# Patient Record
Sex: Male | Born: 1960 | Race: Black or African American | Hispanic: Yes | Marital: Single | Smoking: Current some day smoker
Health system: Southern US, Community
[De-identification: ages and names within clinical notes are randomized; demographics above are authoritative.]

## PROBLEM LIST (undated history)

## (undated) DIAGNOSIS — J449 Chronic obstructive pulmonary disease, unspecified: Secondary | ICD-10-CM

## (undated) DIAGNOSIS — Z8659 Personal history of other mental and behavioral disorders: Secondary | ICD-10-CM

## (undated) DIAGNOSIS — Z8701 Personal history of pneumonia (recurrent): Secondary | ICD-10-CM

## (undated) DIAGNOSIS — N62 Hypertrophy of breast: Secondary | ICD-10-CM

## (undated) DIAGNOSIS — B20 Human immunodeficiency virus [HIV] disease: Secondary | ICD-10-CM

## (undated) DIAGNOSIS — G4700 Insomnia, unspecified: Secondary | ICD-10-CM

## (undated) DIAGNOSIS — R918 Other nonspecific abnormal finding of lung field: Secondary | ICD-10-CM

## (undated) DIAGNOSIS — Z5111 Encounter for antineoplastic chemotherapy: Secondary | ICD-10-CM

## (undated) DIAGNOSIS — J9819 Other pulmonary collapse: Secondary | ICD-10-CM

## (undated) DIAGNOSIS — C3490 Malignant neoplasm of unspecified part of unspecified bronchus or lung: Secondary | ICD-10-CM

## (undated) DIAGNOSIS — IMO0001 Reserved for inherently not codable concepts without codable children: Secondary | ICD-10-CM

## (undated) DIAGNOSIS — E785 Hyperlipidemia, unspecified: Secondary | ICD-10-CM

## (undated) DIAGNOSIS — IMO0002 Reserved for concepts with insufficient information to code with codable children: Secondary | ICD-10-CM

## (undated) DIAGNOSIS — Z21 Asymptomatic human immunodeficiency virus [HIV] infection status: Secondary | ICD-10-CM

## (undated) HISTORY — DX: Chronic obstructive pulmonary disease, unspecified: J44.9

## (undated) HISTORY — DX: Asymptomatic human immunodeficiency virus (hiv) infection status: Z21

## (undated) HISTORY — DX: Human immunodeficiency virus (HIV) disease: B20

## (undated) HISTORY — PX: FOOT SURGERY: SHX648

## (undated) HISTORY — DX: Hyperlipidemia, unspecified: E78.5

## (undated) HISTORY — PX: BACK SURGERY: SHX140

## (undated) HISTORY — DX: Hypertrophy of breast: N62

## (undated) HISTORY — DX: Encounter for antineoplastic chemotherapy: Z51.11

## (undated) HISTORY — DX: Reserved for concepts with insufficient information to code with codable children: IMO0002

## (undated) HISTORY — PX: COLONOSCOPY: SHX174

## (undated) HISTORY — DX: Other nonspecific abnormal finding of lung field: R91.8

## (undated) HISTORY — DX: Reserved for inherently not codable concepts without codable children: IMO0001

---

## 1997-10-08 ENCOUNTER — Encounter: Admission: RE | Admit: 1997-10-08 | Discharge: 1997-10-08 | Payer: Self-pay | Admitting: Internal Medicine

## 1997-11-27 ENCOUNTER — Encounter: Admission: RE | Admit: 1997-11-27 | Discharge: 1997-11-27 | Payer: Self-pay | Admitting: Internal Medicine

## 1998-01-21 ENCOUNTER — Encounter: Admission: RE | Admit: 1998-01-21 | Discharge: 1998-01-21 | Payer: Self-pay | Admitting: Internal Medicine

## 1998-03-08 ENCOUNTER — Encounter: Admission: RE | Admit: 1998-03-08 | Discharge: 1998-03-08 | Payer: Self-pay | Admitting: Internal Medicine

## 1998-03-25 ENCOUNTER — Encounter: Admission: RE | Admit: 1998-03-25 | Discharge: 1998-03-25 | Payer: Self-pay | Admitting: Internal Medicine

## 1998-06-03 ENCOUNTER — Encounter: Admission: RE | Admit: 1998-06-03 | Discharge: 1998-06-03 | Payer: Self-pay | Admitting: Internal Medicine

## 1998-06-18 ENCOUNTER — Encounter: Admission: RE | Admit: 1998-06-18 | Discharge: 1998-06-18 | Payer: Self-pay | Admitting: Internal Medicine

## 1998-08-27 ENCOUNTER — Ambulatory Visit (HOSPITAL_COMMUNITY): Admission: RE | Admit: 1998-08-27 | Discharge: 1998-08-27 | Payer: Self-pay | Admitting: Internal Medicine

## 1998-09-16 ENCOUNTER — Encounter: Admission: RE | Admit: 1998-09-16 | Discharge: 1998-09-16 | Payer: Self-pay | Admitting: Internal Medicine

## 1999-01-31 ENCOUNTER — Ambulatory Visit (HOSPITAL_COMMUNITY): Admission: RE | Admit: 1999-01-31 | Discharge: 1999-01-31 | Payer: Self-pay | Admitting: Internal Medicine

## 1999-01-31 ENCOUNTER — Encounter: Admission: RE | Admit: 1999-01-31 | Discharge: 1999-01-31 | Payer: Self-pay | Admitting: Internal Medicine

## 1999-02-25 ENCOUNTER — Encounter: Admission: RE | Admit: 1999-02-25 | Discharge: 1999-02-25 | Payer: Self-pay | Admitting: Internal Medicine

## 1999-12-09 ENCOUNTER — Ambulatory Visit (HOSPITAL_COMMUNITY): Admission: RE | Admit: 1999-12-09 | Discharge: 1999-12-09 | Payer: Self-pay | Admitting: Internal Medicine

## 1999-12-09 ENCOUNTER — Encounter: Admission: RE | Admit: 1999-12-09 | Discharge: 1999-12-09 | Payer: Self-pay | Admitting: Internal Medicine

## 1999-12-23 ENCOUNTER — Encounter: Admission: RE | Admit: 1999-12-23 | Discharge: 1999-12-23 | Payer: Self-pay | Admitting: Internal Medicine

## 2007-01-13 ENCOUNTER — Ambulatory Visit: Payer: Self-pay | Admitting: Pulmonary Disease

## 2007-01-28 LAB — CONVERTED CEMR LAB
Creatinine, Ser: 0.8 mg/dL
HIV 1 RNA Quant: <100 copies/mL
Hep B S Ab: REACTIVE

## 2007-03-17 ENCOUNTER — Ambulatory Visit: Payer: Self-pay | Admitting: Pulmonary Disease

## 2008-04-27 LAB — CONVERTED CEMR LAB: CD4 Count: 830 microliters

## 2008-06-22 LAB — CONVERTED CEMR LAB: CD4 Count: 880 microliters

## 2010-01-08 ENCOUNTER — Encounter: Payer: Self-pay | Admitting: Internal Medicine

## 2010-01-09 ENCOUNTER — Encounter: Payer: Self-pay | Admitting: Internal Medicine

## 2010-02-18 ENCOUNTER — Ambulatory Visit: Payer: Self-pay | Admitting: Internal Medicine

## 2010-02-18 DIAGNOSIS — B20 Human immunodeficiency virus [HIV] disease: Secondary | ICD-10-CM

## 2010-02-18 DIAGNOSIS — J449 Chronic obstructive pulmonary disease, unspecified: Secondary | ICD-10-CM

## 2010-02-18 DIAGNOSIS — B009 Herpesviral infection, unspecified: Secondary | ICD-10-CM | POA: Insufficient documentation

## 2010-02-18 DIAGNOSIS — J189 Pneumonia, unspecified organism: Secondary | ICD-10-CM

## 2010-02-18 DIAGNOSIS — F329 Major depressive disorder, single episode, unspecified: Secondary | ICD-10-CM

## 2010-02-18 DIAGNOSIS — L2089 Other atopic dermatitis: Secondary | ICD-10-CM

## 2010-02-18 DIAGNOSIS — M4310 Spondylolisthesis, site unspecified: Secondary | ICD-10-CM | POA: Insufficient documentation

## 2010-02-18 DIAGNOSIS — J4489 Other specified chronic obstructive pulmonary disease: Secondary | ICD-10-CM | POA: Insufficient documentation

## 2010-02-18 LAB — CONVERTED CEMR LAB
ALT: 17 units/L (ref 0–53)
AST: 18 units/L (ref 0–37)
Alkaline Phosphatase: 96 units/L (ref 39–117)
Basophils Absolute: 0 10*3/uL (ref 0.0–0.1)
Basophils Relative: 0 % (ref 0–1)
Chlamydia, Swab/Urine, PCR: NEGATIVE
Creatinine, Ser: 0.93 mg/dL (ref 0.40–1.50)
Eosinophils Relative: 1 % (ref 0–5)
GC Probe Amp, Urine: NEGATIVE
HCT: 44.1 % (ref 39.0–52.0)
Hemoglobin, Urine: NEGATIVE
Hemoglobin: 14.3 g/dL (ref 13.0–17.0)
Ketones, ur: NEGATIVE mg/dL
MCHC: 32.4 g/dL (ref 30.0–36.0)
Monocytes Absolute: 0.5 10*3/uL (ref 0.1–1.0)
Nitrite: NEGATIVE
RDW: 14.4 % (ref 11.5–15.5)
Total Bilirubin: 0.4 mg/dL (ref 0.3–1.2)
Total CHOL/HDL Ratio: 5.7
Urobilinogen, UA: 0.2 (ref 0.0–1.0)
VLDL: 25 mg/dL (ref 0–40)
pH: 6 (ref 5.0–8.0)

## 2010-03-04 ENCOUNTER — Encounter: Payer: Self-pay | Admitting: Internal Medicine

## 2010-03-07 ENCOUNTER — Ambulatory Visit: Payer: Self-pay | Admitting: Internal Medicine

## 2010-03-07 DIAGNOSIS — F1021 Alcohol dependence, in remission: Secondary | ICD-10-CM | POA: Insufficient documentation

## 2010-03-07 DIAGNOSIS — G4700 Insomnia, unspecified: Secondary | ICD-10-CM | POA: Insufficient documentation

## 2010-03-07 DIAGNOSIS — R21 Rash and other nonspecific skin eruption: Secondary | ICD-10-CM

## 2010-07-15 NOTE — Consult Note (Signed)
Summary: [facility]: ID Clinic  [facility]: ID Clinic   Imported By: Florinda Marker 03/13/2010 15:37:55  _____________________________________________________________________  External Attachment:    Type:   Image     Comment:   External Document

## 2010-07-15 NOTE — Assessment & Plan Note (Signed)
Summary: Nurse Visit (Infectious Disease)           Medication Adherence: 02/18/2010   Adherence to medications reviewed with patient. Counseling to provide adequate adherence provided    Prevention For Positives: 02/18/2010   Safe sex practices discussed with patient. Condoms offered.        02/18/2010   Patient was screened for substance abuse and depression. Referal was made as indicated.                     Infectious Disease New Patient Intake Referring MD/Agency: Jaime Morris Address: [place] [address] [place] , [place] [address]  Return Appointment Date: 03/04/2010 Medical Records: Received Health Insurance / Payor: Private Does insurance cover prescriptions? Yes Our patient has been informed that medication assistance programs are available.  Our Co-ordinator will be meeting with the patient during this visit to discuss financial and medication assistance.   Do you have a Primary physician: No Are family members aware of patient's diagnosis?  If so, are they supportive? yes, supportive Describe patient's current social support (family, friends, support groups): good  Medical History Medications: No   Medical Hx Comments: pt is not current  taking medications  due to lack of f/u At [facility] .  Combivir and Sustiva last taken 2/11 Previous meds:  Spiriva, Symbicort, anti hypertensive  [facility] patient since 2008   Family History Hypertension: Yes  Family Side: Maternal, Paternal Diabetes: Yes  Family Side: Maternal, Paternal High Cholesterol: Yes  Family Side: Maternal, Paternal  Tobacco use: current Year started: age 50  Amt: 1 packs per day. Counseled to quit/cut down: yes  Behavioral Health Assessment Have you ever been diagnosed with depression or mental illness? Yes  Diagnosis: Depression: previously on antidepressants  Do you drink alcohol? No Do you use recreational drugs? No Do you feel you have a problem with drugs and/or alcohol? No   Have you ever been in a  treatment facility for any addiction? No  HIV Intake Information When did you first test positive for HIV? 1996 Where was this test performed?  City/State: [place], [place]   [place]: [place] Was this your first time ever being tested or HIV? Yes Risk Factor(s) for HIV: MSM  Method of Exposure to HIV: Homosexual Intercourse-Receptive Other: Last sexual contact 2009   lifetime male partners: 20  Have you ever been hospitalized for any HIV-related condition? Yes [place] Name 1: [place]   Reason: bilateral pneumonia   Date: 2006/2007 Morris Name 2: [place]   Reason: Spinal surgery  2 rods in his spine  Date: 2007  Newly Diagnosed Patients Has a Disease Intervention Specialist from the Health Department contacted the patient? Yes.   The patient has been informed that the [facility] Department will contact ALL newly reported cases. Health Department Contact:  [phone]   (SSN is needed for confirmation)  Reported Today: 02/21/2010 Health Department Contact:  [phone]            (SSN is needed for confirmation)  Person Reporting: prev. reported  Do you have any Non-HIV related medical conditions or other prior hospitalizations or surgeries? Yes If yes, please explain: Spondylolisthesis   HIV Medications Information  Protease Inhibitors (PI's): Kaletra (Lopinavir/Ritonavir): Yes   Last Date of Use:  02/11  Nucleoside Reverse Transcriptase Inhibitors (NRTI's) Combivir (Lamivadin 150mg /Zidovudine 300mg ): Yes   Last Date of Use:  02/11 Epivir (Lamivudine) (3TC): Yes   Last Date of Use:  2008  Infection History  Patient  has been diagnosed with the following opportunistic infections: Herpes Yes Pneumonia, Recurrent Yes Tuberculosis (TB) treated 1980 Are there any other symptoms you need to discuss? No Have you received literature/education prior to this visit about HIV/AIDS? Yes Do you understand the meaning of a Viral Load? Yes Do you understand the meaning of a CD4 count?  Yes Lab Values Education/Handout Given Yes Medication Education/Handout Given Yes  Sexual History Are you in a current relationship? No Are you currently sexually active? No Safe Sex Counseling/Pamphlet Given Sexual History Comments: Last sexual act 2009 per pt report   Evaluation and Follow-Up INTAKE CHECK LIST: HIV Education, Safe Sex Counseling, HIV Material Given, [facility] Consent  Prevention For Positives: 02/18/2010   Safe sex practices discussed with patient. Condoms offered. Jaime [facility] Consent: Yes Are you in need of condoms at this time? No Our patient has been informed that condoms are always available in this clinic.   Brochure Provided for Above Organizations? No Does patient have problems that warrant Social Worker referral? No    -  Date:  06/22/2008    CD4: 880    Viral Load: <50  Date:  04/27/2008    CD4: 830    Viral Load: 58  Date:  01/28/2007    CD4: 1040    Viral Load: <100    Glucose: 100    Hemoglobin: 15.3    Platelets: 309    Creatinine: 0.8    RPR: non reactive    Hep B Surface Antigens: non reactive    Hep B Surface Antibodies: reactive    Hep C Antibodies: non reative    Depression History:      The patient is having a depressed mood most of the day but denies diminished interest in his usual daily activities.        Risk factors for depression include chronic illness.  The patient denies that he feels like life is not worth living, denies that he wishes that he were dead, and denies that he has thought about ending his life.

## 2010-07-15 NOTE — Letter (Signed)
Summary: [facility]: New Pt. Referral  [facility]: New Pt. Referral   Imported By: Florinda Marker 03/18/2010 08:54:16  _____________________________________________________________________  External Attachment:    Type:   Image     Comment:   External Document

## 2010-07-15 NOTE — Letter (Signed)
Summary: New Pt. Referral  New Pt. Referral   Imported By: Florinda Marker 03/13/2010 16:17:14  _____________________________________________________________________  External Attachment:    Type:   Image     Comment:   External Document

## 2010-07-15 NOTE — Assessment & Plan Note (Signed)
Summary: new pt/jc   CC:  new pt. to establish, lab results, and c/o trouble sleeping.  History of Present Illness: This is the first ID clinic visit for Jaime Morris. He was diagnosed HIV (+) in 1996.  He was receiving his care a [facility] but decided to transfer here.  He was on combivir and Sustiva until 2/11 but stopped due to financial reasons.  His deductible for his insurance is 1600 dollars and he did not have it. Genotype done at [facility] in August did not show any resistance. He has been feeling well.  He just noted some posterior cervical adenopathy. Risk factor is MSM.  Not currently with a partner. He also noted some insomnia.  He has been using benadryl without much success. He had been on ambien in the past. Ptalso c/o pruritic rash on arms.  No new soaps, detergents etc.  Preventive Screening-Counseling & Management  Alcohol-Tobacco     Smoking Status: current     Smoking Cessation Counseling: yes     Packs/Day: 1     Year Started: age 5   Caffeine-Diet-Exercise     Caffeine use/day: coffee 3 per day     Does Patient Exercise: no  Safety-Violence-Falls     Seat Belt Use: yes      Sexual History:  none.        Drug Use:  former and alcohol.    Comments: pt. declined condoms   Updated Prior Medication List: NORVASC 10 MG TABS (AMLODIPINE BESYLATE) Take 1 tablet by mouth once a day ENALAPRIL MALEATE 10 MG TABS (ENALAPRIL MALEATE) Take 1 tablet by mouth once a day AMBIEN 10 MG TABS (ZOLPIDEM TARTRATE) Take 1 tablet by mouth at bedtime as needed LOTRISONE 1-0.05 % CREA (CLOTRIMAZOLE-BETAMETHASONE) apply two times a day ATRIPLA 600-200-300 MG TABS (EFAVIRENZ-EMTRICITAB-TENOFOVIR) Take 1 tablet by mouth at bedtime  Current Allergies (reviewed today): No known allergies  Social History: Drug Use:  former, alcohol Sexual History:  none  Review of Systems  The patient denies anorexia, fever, weight loss, and prolonged cough.    Vital Signs:  Patient profile:   50 year old  male Height:      71.5 inches (181.61 cm) Weight:      186.8 pounds (84.91 kg) BMI:     25.78 Temp:     98.0 degrees F (36.67 degrees C) oral Pulse rate:   87 / minute BP sitting:   141 / 78  (right arm)  Vitals Entered By: Wendall Mola CMA Duncan Dull) (March 07, 2010 2:02 PM) CC: new pt. to establish, lab results, c/o trouble sleeping Is Patient Diabetic? No Pain Assessment Patient in pain? no      Nutritional Status BMI of 25 - 29 = overweight Nutritional Status Detail appetite "good"  Does patient need assistance? Functional Status Self care Ambulation Normal Comments pt. has been off HAART meds since 2/11   Physical Exam  General:  alert, well-developed, well-nourished, and well-hydrated.   Head:  normocephalic and atraumatic.   Mouth:  pharynx pink and moist.  no thrush  Neck:  bilateral posterior nodes Lungs:  normal breath sounds.      Impression & Recommendations:  Problem # 1:  HIV INFECTION (ICD-042) Will refer to Byrd Hesselbach to see if we can get him help getting his meds. all she can do is to get him a co-pay card. He said he willwait to [place] he cna pay the deductable and start meds.  CD4ct is still good so i do  not think waiting will hurt him.  Influenza vaccine given. Diagnostics Reviewed:  HIV: HIV positive - not AIDS (02/18/2010)   CD4: 520 (02/19/2010)   WBC: 6.2 (02/18/2010)   Hgb: 14.3 (02/18/2010)   HCT: 44.1 (02/18/2010)   Platelets: 218 (02/18/2010) HIV-1 RNA: 27400 (02/18/2010)   HBSAg: non reactive (01/28/2007)  Orders: New Patient Level III (99203)Future Orders: T-CD4SP ([facility]) (CD4SP) ... 06/05/2010 T-HIV Viral Load [phone]) ... 06/05/2010 T-Comprehensive Metabolic Panel [phone]) ... 06/05/2010 T-CBC w/Diff ([id number]) ... 06/05/2010  Problem # 2:  INSOMNIA (ICD-780.52)  His updated medication list for this problem includes:    Ambien 10 Mg Tabs (Zolpidem tartrate) .Marland Kitchen... Take 1 tablet by mouth at bedtime as  needed  Problem # 3:  SKIN RASH (ICD-782.1) will treat with lotrisone His updated medication list for this problem includes:    Lotrisone 1-0.05 % Crea (Clotrimazole-betamethasone) .Marland Kitchen... Apply two times a day  Medications Added to Medication List This Visit: 1)  Norvasc 10 Mg Tabs (Amlodipine besylate) .... Take 1 tablet by mouth once a day 2)  Enalapril Maleate 10 Mg Tabs (Enalapril maleate) .... Take 1 tablet by mouth once a day 3)  Ambien 10 Mg Tabs (Zolpidem tartrate) .... Take 1 tablet by mouth at bedtime as needed 4)  Lotrisone 1-0.05 % Crea (Clotrimazole-betamethasone) .... Apply two times a day 5)  Atripla 600-200-300 Mg Tabs (Efavirenz-emtricitab-tenofovir) .... Take 1 tablet by mouth at bedtime  Other Orders: Influenza Vaccine NON MCR ([id number])  Patient Instructions: 1)  Please schedule a follow-up appointment in 3 months, 2 weeks after labs.  Prescriptions: ATRIPLA 600-200-300 MG TABS (EFAVIRENZ-EMTRICITAB-TENOFOVIR) Take 1 tablet by mouth at bedtime  #90 x 3   Entered and Authorized by:   Yisroel Ramming MD   Signed by:   Yisroel Ramming MD on 03/07/2010   Method used:   Print then Give to Patient   RxID:   [id number] LOTRISONE 1-0.05 % CREA (CLOTRIMAZOLE-BETAMETHASONE) apply two times a day  #60gm x 0   Entered and Authorized by:   Yisroel Ramming MD   Signed by:   Yisroel Ramming MD on 03/07/2010   Method used:   Print then Give to Patient   RxID:   [id number] AMBIEN 10 MG TABS (ZOLPIDEM TARTRATE) Take 1 tablet by mouth at bedtime as needed  #90 x 3   Entered and Authorized by:   Yisroel Ramming MD   Signed by:   Yisroel Ramming MD on 03/07/2010   Method used:   Print then Give to Patient   RxID:   [id number]    Immunizations Administered:  Influenza Vaccine # 1:    Vaccine Type: Fluvax Non-MCR    Site: right deltoid    Mfr: Novartis    Dose: 0.5 ml    Route: IM    Given by: Wendall Mola CMA ( AAMA)    Exp. Date: 09/14/2010    Lot #: [id number]  3P    VIS given: 01/07/10 version given March 07, 2010.  Flu Vaccine Consent Questions:    Do you have a history of severe allergic reactions to this vaccine? no    Any prior history of allergic reactions to egg and/or gelatin? no    Do you have a sensitivity to the preservative Thimersol? no    Do you have a past history of Guillan-Barre Syndrome? no    Do you currently have an acute febrile illness? no    Have you ever had a severe reaction to latex? no  Vaccine information given and explained to patient? yes

## 2010-07-15 NOTE — Miscellaneous (Signed)
Summary: HIPAA Restrictions  HIPAA Restrictions   Imported By: Florinda Marker 02/19/2010 09:57:00  _____________________________________________________________________  External Attachment:    Type:   Image     Comment:   External Document

## 2010-07-16 ENCOUNTER — Encounter (INDEPENDENT_AMBULATORY_CARE_PROVIDER_SITE_OTHER): Payer: Self-pay | Admitting: *Deleted

## 2010-07-21 ENCOUNTER — Encounter: Payer: Self-pay | Admitting: Adult Health

## 2010-07-21 ENCOUNTER — Other Ambulatory Visit: Payer: Self-pay | Admitting: Adult Health

## 2010-07-21 ENCOUNTER — Other Ambulatory Visit (INDEPENDENT_AMBULATORY_CARE_PROVIDER_SITE_OTHER): Payer: BC Managed Care – PPO

## 2010-07-21 DIAGNOSIS — B20 Human immunodeficiency virus [HIV] disease: Secondary | ICD-10-CM

## 2010-07-21 LAB — CONVERTED CEMR LAB
AST: 17 units/L (ref 0–37)
Albumin: 4 g/dL (ref 3.5–5.2)
BUN: 14 mg/dL (ref 6–23)
Basophils Absolute: 0 10*3/uL (ref 0.0–0.1)
Calcium: 9.5 mg/dL (ref 8.4–10.5)
Chloride: 108 meq/L (ref 96–112)
HIV 1 RNA Quant: 42200 copies/mL — ABNORMAL HIGH (ref <20)
HIV-1 RNA Quant, Log: 4.63 — ABNORMAL HIGH (ref <1.30)
Hemoglobin: 14.1 g/dL (ref 13.0–17.0)
Lymphocytes Relative: 52 % — ABNORMAL HIGH (ref 12–46)
Monocytes Absolute: 0.5 10*3/uL (ref 0.1–1.0)
Neutro Abs: 1.8 10*3/uL (ref 1.7–7.7)
Neutrophils Relative %: 37 % — ABNORMAL LOW (ref 43–77)
Platelets: 174 10*3/uL (ref 150–400)
Potassium: 4.6 meq/L (ref 3.5–5.3)
RDW: 14.4 % (ref 11.5–15.5)
Total Protein: 7.6 g/dL (ref 6.0–8.3)

## 2010-07-22 LAB — T-HELPER CELL (CD4) - (RCID CLINIC ONLY): CD4 T Cell Abs: 510 uL (ref 400–2700)

## 2010-07-23 ENCOUNTER — Encounter (INDEPENDENT_AMBULATORY_CARE_PROVIDER_SITE_OTHER): Payer: Self-pay | Admitting: *Deleted

## 2010-07-23 NOTE — Miscellaneous (Signed)
  Clinical Lists Changes  Observations: Added new observation of INCOMESOURCE: UNKNOWN (07/16/2010 11:53) Added new observation of HOUSEINCOME: 0  (07/16/2010 11:53) Added new observation of #CHILD<18 IN: Unknown  (07/16/2010 11:53) Added new observation of FAMILYSIZE: 0  (07/16/2010 11:53) Added new observation of HOUSING: Unknown  (07/16/2010 11:53) Added new observation of YEARLYEXPEN: 0  (07/16/2010 11:53)

## 2010-07-31 NOTE — Miscellaneous (Signed)
  Clinical Lists Changes 

## 2010-08-04 ENCOUNTER — Encounter: Payer: Self-pay | Admitting: Adult Health

## 2010-08-04 ENCOUNTER — Ambulatory Visit: Payer: BC Managed Care – PPO | Admitting: Adult Health

## 2010-08-04 DIAGNOSIS — F17200 Nicotine dependence, unspecified, uncomplicated: Secondary | ICD-10-CM | POA: Insufficient documentation

## 2010-08-12 NOTE — Assessment & Plan Note (Signed)
Summary: 2WK F/U/VS   Vital Signs:  Patient profile:   50 year old male Height:      71.5 inches Weight:      191 pounds BMI:     26.36 BSA:     2.08 Temp:     97.9 degrees F oral Pulse rate:   87 / minute BP sitting:   147 / 83  (right arm)  Vitals Entered By: Tomasita Morrow RN (August 04, 2010 9:06 AM) CC: f/u ov   ,  talk about starting medication   Pain Assessment Patient in pain? no      Nutritional Status BMI of 25 - 29 = overweight Nutritional Status Detail normal  Have you ever been in a relationship where you felt threatened, hurt or afraid?No  Domestic Violence Intervention none  Does patient need assistance? Functional Status Self care Ambulation Normal   CC:  f/u ov    and talk about starting medication  .  History of Present Illness: Follow-up visit.  Ready to re-start ARV therapy.  Slated to start Atripla therapy and has adequate funding for meds, but still claims meds remain costly prohibitive.  Also states have "rash"  on right hip area which seems to be getting large.  Rash pruritic but without vessicles, drainage or pus formation.  States is currently planning to start Chantix therapy for smoking cessation.  Received Rx from work, and stated will begin the next "few days."  Preventive Screening-Counseling & Management  Alcohol-Tobacco     Smoking Status: current     Smoking Cessation Counseling: yes     Packs/Day: 1     Year Started: age 22   Caffeine-Diet-Exercise     Does Patient Exercise: no  Comments: starting Chantix today   Hep-HIV-STD-Contraception     HIV Risk: risk noted  Safety-Violence-Falls     Seat Belt Use: yes  Allergies (verified): No Known Drug Allergies  Past History:  Past medical, surgical, family and social histories (including risk factors) reviewed for relevance to current acute and chronic problems.  Family History: Reviewed history and no changes required.  Social History: Reviewed history and no changes  required.  Review of Systems  The patient denies anorexia, fever, weight loss, weight gain, vision loss, decreased hearing, hoarseness, chest pain, syncope, dyspnea on exertion, peripheral edema, prolonged cough, headaches, hemoptysis, abdominal pain, melena, hematochezia, severe indigestion/heartburn, hematuria, incontinence, genital sores, muscle weakness, suspicious skin lesions, transient blindness, difficulty walking, depression, unusual weight change, abnormal bleeding, enlarged lymph nodes, angioedema, and testicular masses.         Rash outluined in HPI  Physical Exam  General:  alert, well-developed, well-nourished, and well-hydrated.   Head:  normocephalic and atraumatic.   Eyes:  vision grossly intact, pupils equal, pupils round, and pupils reactive to light.   Ears:  R ear normal and L ear normal.   Nose:  no external deformity, no external erythema, and no nasal discharge.   Mouth:  good dentition, no gingival abnormalities, no dental plaque, and pharynx pink and moist.   Neck:  supple and full ROM.   Lungs:  normal respiratory effort and normal breath sounds.   Heart:  normal rate, regular rhythm, no murmur, no gallop, and no rub.   Abdomen:  soft, non-tender, normal bowel sounds, no hepatomegaly, and no splenomegaly.   Genitalia:  Testes bilaterally descended without nodularity, tenderness or masses. No scrotal masses or lesions. No penis lesions or urethral discharge. Msk:  normal ROM, no joint tenderness,  no joint swelling, no joint warmth, and no redness over joints.   Extremities:  No clubbing, cyanosis, edema, or deformity noted with normal full range of motion of all joints.   Neurologic:  alert & oriented X3, cranial nerves II-XII intact, strength normal in all extremities, and gait normal.   Skin:  Papular, hyperkeratotic, hyperpigmened skin patch inferior to right greater trochanter measuring  10 x 7 cm. No drainage, no vesicles, some scabbed excoriations c/w healed  scratch wounds noted. Cervical Nodes:  shoddy A&P Cx LN's Psych:  Cognition and judgment appear intact. Alert and cooperative with normal attention span and concentration. No apparent delusions, illusions, hallucinations   Impression & Recommendations:  Problem # 1:  HIV INFECTION (ICD-042) CD4 510 @ 19% with HIV RNA 422000 copies/ml per PCR.  Spent > 20 min discussing treatment regimen,, medication SE's, ADR's, and toxicities.  Time allowed to answer questions and he verbally acknowledged understanding of instructions.  Plan to begin Atripla 1 tab by mouth at bedtime on empty stomach.  To return for labs in 4 weeks with f/u in 6 weeks.  Atripla co-pay cards also dispensed.  Verbally acknowledged and agreed with plan.  Problem # 2:  SKIN RASH (ICD-782.1) Findings c/w dermatiophyte infection.  Instructed to use Lotrisone cream to this site two times a day and we will monitor response on RTC. His updated medication list for this problem includes:    Lotrisone 1-0.05 % Crea (Clotrimazole-betamethasone) .Marland Kitchen... Apply two times a day  Problem # 3:  SMOKER (ICD-305.1) Preparing to start Chantix therapy.  Informed of SE's of therapy and the possibility of increased vivid dreams while re-starting efavirenz tjherapy.  Verbally acknowledged this and agreed to contact clinic if depressiion or emotional changes occur while on therapy.   Prescriptions: AMBIEN 10 MG TABS (ZOLPIDEM TARTRATE) Take 1 tablet by mouth at bedtime as needed  #90 x 3   Entered and Authorized by:   Talmadge Chad NP   Signed by:   Talmadge Chad NP on 08/04/2010   Method used:   Print then Give to Patient   RxID:   [id number] ATRIPLA 600-200-300 MG TABS (EFAVIRENZ-EMTRICITAB-TENOFOVIR) Take 1 tablet by mouth at bedtime  #90 x 3   Entered and Authorized by:   Talmadge Chad NP   Signed by:   Talmadge Chad NP on 08/04/2010   Method used:   Print then Give to Patient   RxID:   [id number]

## 2010-08-14 ENCOUNTER — Encounter: Payer: Self-pay | Admitting: Adult Health

## 2010-08-14 ENCOUNTER — Telehealth (INDEPENDENT_AMBULATORY_CARE_PROVIDER_SITE_OTHER): Payer: Self-pay | Admitting: *Deleted

## 2010-08-21 NOTE — Progress Notes (Addendum)
Summary: prior auth. for Ambien faxed  Phone Note Outgoing Call   Call placed by: Annice Pih Summary of Call: Faxed prior auth. for Ambien to [employer] at fax # [phone],  pts. [employer] ID # [id number] Initial call taken by: Wendall Mola CMA ( AAMA),  August 14, 2010 10:52 AM     Appended Document: prior auth. for Ambien faxed Case # [id number].  Pt. approved from 07/24/10 to 08/14/2011 by [facility].

## 2010-08-26 NOTE — Medication Information (Signed)
Summary: Tax adviser   Imported By: Florinda Marker 08/22/2010 09:06:05  _____________________________________________________________________  External Attachment:    Type:   Image     Comment:   External Document

## 2010-08-26 NOTE — Letter (Signed)
Summary: [employer]  [employer]   Imported By: Florinda Marker 08/22/2010 09:05:16  _____________________________________________________________________  External Attachment:    Type:   Image     Comment:   External Document

## 2010-08-26 NOTE — Medication Information (Signed)
Summary: [employer] Prior Auth: RX  [employer] Prior Auth: RX   Imported By: Florinda Marker 08/22/2010 09:03:41  _____________________________________________________________________  External Attachment:    Type:   Image     Comment:   External Document

## 2010-08-28 ENCOUNTER — Encounter: Payer: Self-pay | Admitting: Adult Health

## 2010-08-28 LAB — T-HELPER CELL (CD4) - (RCID CLINIC ONLY)
CD4 % Helper T Cell: 18 % — ABNORMAL LOW (ref 33–55)
CD4 T Cell Abs: 520 uL (ref 400–2700)

## 2010-09-03 ENCOUNTER — Other Ambulatory Visit: Payer: Self-pay | Admitting: Adult Health

## 2010-09-03 ENCOUNTER — Other Ambulatory Visit: Payer: BC Managed Care – PPO | Admitting: Adult Health

## 2010-09-03 DIAGNOSIS — B20 Human immunodeficiency virus [HIV] disease: Secondary | ICD-10-CM

## 2010-09-04 LAB — CBC WITH DIFFERENTIAL/PLATELET
Basophils Relative: 0 % (ref 0–1)
Hemoglobin: 14.5 g/dL (ref 13.0–17.0)
Lymphs Abs: 3 10*3/uL (ref 0.7–4.0)
Monocytes Relative: 8 % (ref 3–12)
Neutro Abs: 2.5 10*3/uL (ref 1.7–7.7)
Neutrophils Relative %: 41 % — ABNORMAL LOW (ref 43–77)
Platelets: 215 10*3/uL (ref 150–400)
RBC: 5.18 MIL/uL (ref 4.22–5.81)

## 2010-09-04 LAB — T-HELPER CELLS (CD4) COUNT (NOT AT ARMC)
Total Lymphocyte: 49 % — ABNORMAL HIGH (ref 12–46)
WBC, lymph enumeration: 6.2 10*3/uL (ref 4.0–10.5)

## 2010-09-04 LAB — COMPREHENSIVE METABOLIC PANEL
Albumin: 4.4 g/dL (ref 3.5–5.2)
BUN: 15 mg/dL (ref 6–23)
CO2: 25 mEq/L (ref 19–32)
Calcium: 9.5 mg/dL (ref 8.4–10.5)
Chloride: 102 mEq/L (ref 96–112)
Glucose, Bld: 107 mg/dL — ABNORMAL HIGH (ref 70–99)
Potassium: 4.6 mEq/L (ref 3.5–5.3)
Total Protein: 7.9 g/dL (ref 6.0–8.3)

## 2010-09-18 ENCOUNTER — Ambulatory Visit (INDEPENDENT_AMBULATORY_CARE_PROVIDER_SITE_OTHER): Payer: BC Managed Care – PPO | Admitting: Adult Health

## 2010-09-18 ENCOUNTER — Encounter: Payer: Self-pay | Admitting: Adult Health

## 2010-09-18 DIAGNOSIS — J449 Chronic obstructive pulmonary disease, unspecified: Secondary | ICD-10-CM

## 2010-09-18 DIAGNOSIS — Z79899 Other long term (current) drug therapy: Secondary | ICD-10-CM

## 2010-09-18 DIAGNOSIS — Z21 Asymptomatic human immunodeficiency virus [HIV] infection status: Secondary | ICD-10-CM

## 2010-09-18 DIAGNOSIS — F17200 Nicotine dependence, unspecified, uncomplicated: Secondary | ICD-10-CM

## 2010-09-18 DIAGNOSIS — B20 Human immunodeficiency virus [HIV] disease: Secondary | ICD-10-CM

## 2010-09-18 NOTE — Progress Notes (Signed)
  Subjective:    Patient ID: Jaime Morris, male    DOB: Sep 26, 1960, 50 y.o.   MRN: [id number]  HPI Doing well, adherent with meds, some mild headache, manageable.  Stopped smoking on 3/29.  States has gained 8 pounds since.   Review of Systems  Constitutional: Positive for appetite change.  HENT: Negative.   Eyes: Negative.   Respiratory: Negative.   Cardiovascular: Negative.   Gastrointestinal: Negative.   Genitourinary: Negative.   Musculoskeletal: Negative.   Skin: Negative.   Neurological: Negative.   Hematological: Negative.   Psychiatric/Behavioral: Negative.        Objective:   Physical Exam  Constitutional: He is oriented to person, place, and time. He appears well-developed.  HENT:  Head: Normocephalic and atraumatic.  Right Ear: External ear normal.  Left Ear: External ear normal.  Nose: Nose normal.  Mouth/Throat: Oropharynx is clear and moist.  Eyes: Conjunctivae are normal. Pupils are equal, round, and reactive to light.  Neck: Normal range of motion. Neck supple.  Cardiovascular: Normal rate, regular rhythm, normal heart sounds and intact distal pulses.   Pulmonary/Chest: Effort normal and breath sounds normal.  Abdominal: Soft. Bowel sounds are normal.  Musculoskeletal: Normal range of motion.  Neurological: He is alert and oriented to person, place, and time. Coordination normal.  Skin: Skin is warm and dry.  Psychiatric: He has a normal mood and affect. His behavior is normal. Judgment and thought content normal.          Assessment & Plan:  HIV:  CD4 547 @ 18% with VL of 138 copies.  Clinically stable on current regimen.  Will CPM with fasting labs in 10 weeks and f/u in 3 months.  Smoker: Positive reinforcement provided, activities to offset appetite suggested.

## 2010-10-28 NOTE — Assessment & Plan Note (Signed)
[name]                             PULMONARY OFFICE NOTE   Jaime Morris, Jaime Morris                      MRN:          [id number]  DATE:01/13/2007                            DOB:          [birth year]    HISTORY OF PRESENT ILLNESS:  The patient is a 50 year old gentleman who  I have been asked to see for obstructive lung disease.  The patient  states that he has had dyspnea on exertion for years, but has progressed  somewhat lately.  He is trying to get on an exercise program.  The  patient has been on Spiriva in the past, but has not been taking it on a  regular basis.  He states that he has approximately 10 blocks dyspnea on  exertion at a moderate pace on flat ground.  He has noted it bringing  groceries in from the car, and feels that he can vacuum the whole house  without getting overly winded.  He does have a dry hacky cough primarily  productive of white mucus in the mornings.  Unfortunately the patient  continues to smoke.   PAST MEDICAL HISTORY:  1. Significant for hypertension.  2. History of allergic rhinitis.  3. History of COPD.  4. History of HIV positivity.  5. History of neck and back surgery in 2007.   CURRENT MEDICATIONS:  1. Norvasc 10 mg daily.  2. Enalapril 10 mg daily.  3. Combivir 150/30 b.i.d.  4. Sustiva 600 mg daily.  5. Ambien p.r.n.   ALLERGIES:  The patient has no known drug allergies.   SOCIAL HISTORY:  He is single.  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  He has a history of smoking one pack per day for 35  years and continues to do so.   FAMILY HISTORY:  Remarkable for parents having hypertension and CVA's;  otherwise, unremarkable.   REVIEW OF SYSTEMS:  As per History of Present Illness.  See patient  intake form documented on the chart.   PHYSICAL EXAMINATION:  GENERAL:  He is a thin male in no acute distress.  VITAL SIGNS:  Blood pressure 118/74, pulse 76, temperature 98.2, weight  191  pounds, O2 saturation on room air 96%.  HEENT:  Pupils equal, round, reactive to light and accommodation.  Extraocular muscles are intact.  Nares are patent without discharge.  Oropharynx is clear.  NECK:  Supple without JVD or lymphadenopathy.  There is no palpable  thyromegaly.  CHEST:  Reveals decreased breath sounds throughout but no wheezes.  CARDIAC:  Reveals regular rate and rhythm, no murmurs, rubs or gallops.  ABDOMEN:  Soft, nontender with good bowel sounds.  GENITAL/RECTAL/BREASTS:  Not done and not indicated.  EXTREMITIES:  Lower extremities are without edema, pulses are intact  distally.  NEUROLOGICAL:  Alert and oriented with no obvious motor deficits.   LABORATORY DATA:  Pulmonary function study obtained in the office does  show moderate obstructive disease with an FEV-1 58%.  He has no  restriction and only a mild reduction in DLCO.  He was not responsive to  bronchodilators.  Chest x-ray was done and showed no acute process.   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  Unfortunately he has ongoing tobacco use and probably has recurrent  asthmatic bronchitis from his smoking.  If he would quit smoking, I  suspect he would do well with Spiriva alone.  However, may benefit from  inhaled corticosteroids until then.   PLAN:  1. Advair 250/50 b.i.d. to be added to the Spiriva.  2. I have given the patient a prescription for Chantix in order to      help him quit smoking.  However, I told him not to fill this until      he called his infectious disease doctor and made sure it does not      interfere with any of his HIV medications.  3. The patient will follow up in eight weeks or sooner if there are      problems.     Barbaraann Share, MD,FCCP  Electronically Signed    [facility]  DD: 03/02/2007  DT: 03/03/2007  Job #: [id number]   cc:   Bess Kinds, MD

## 2010-12-16 ENCOUNTER — Other Ambulatory Visit: Payer: Self-pay | Admitting: Infectious Diseases

## 2010-12-16 ENCOUNTER — Other Ambulatory Visit (INDEPENDENT_AMBULATORY_CARE_PROVIDER_SITE_OTHER): Payer: BC Managed Care – PPO

## 2010-12-16 DIAGNOSIS — B20 Human immunodeficiency virus [HIV] disease: Secondary | ICD-10-CM

## 2010-12-16 DIAGNOSIS — Z79899 Other long term (current) drug therapy: Secondary | ICD-10-CM

## 2010-12-16 LAB — CBC WITH DIFFERENTIAL/PLATELET
Hemoglobin: 14.9 g/dL (ref 13.0–17.0)
Lymphocytes Relative: 36 % (ref 12–46)
Lymphs Abs: 2.2 10*3/uL (ref 0.7–4.0)
Neutro Abs: 3.4 10*3/uL (ref 1.7–7.7)
Neutrophils Relative %: 56 % (ref 43–77)
Platelets: 241 10*3/uL (ref 150–400)
RBC: 5.07 MIL/uL (ref 4.22–5.81)
WBC: 6.1 10*3/uL (ref 4.0–10.5)

## 2010-12-16 LAB — COMPREHENSIVE METABOLIC PANEL
Albumin: 4.4 g/dL (ref 3.5–5.2)
BUN: 13 mg/dL (ref 6–23)
CO2: 24 mEq/L (ref 19–32)
Glucose, Bld: 107 mg/dL — ABNORMAL HIGH (ref 70–99)
Potassium: 4.4 mEq/L (ref 3.5–5.3)
Sodium: 137 mEq/L (ref 135–145)
Total Protein: 7.9 g/dL (ref 6.0–8.3)

## 2010-12-16 LAB — LIPID PANEL
Cholesterol: 196 mg/dL (ref 0–200)
HDL: 39 mg/dL — ABNORMAL LOW (ref >39)
Triglycerides: 118 mg/dL (ref <150)
VLDL: 24 mg/dL (ref 0–40)

## 2010-12-18 LAB — T-HELPER CELL (CD4) - (RCID CLINIC ONLY)
CD4 % Helper T Cell: 22 % — ABNORMAL LOW (ref 33–55)
CD4 T Cell Abs: 540 uL (ref 400–2700)

## 2010-12-30 ENCOUNTER — Ambulatory Visit: Payer: BC Managed Care – PPO | Admitting: Adult Health

## 2010-12-30 ENCOUNTER — Encounter: Payer: Self-pay | Admitting: Adult Health

## 2010-12-30 ENCOUNTER — Ambulatory Visit (INDEPENDENT_AMBULATORY_CARE_PROVIDER_SITE_OTHER): Payer: BC Managed Care – PPO | Admitting: Adult Health

## 2010-12-30 VITALS — BP 143/83 | HR 86 | Temp 98.1°F | Ht 70.0 in | Wt 194.0 lb

## 2010-12-30 DIAGNOSIS — Z0000 Encounter for general adult medical examination without abnormal findings: Secondary | ICD-10-CM

## 2010-12-30 DIAGNOSIS — B20 Human immunodeficiency virus [HIV] disease: Secondary | ICD-10-CM

## 2010-12-30 DIAGNOSIS — E785 Hyperlipidemia, unspecified: Secondary | ICD-10-CM

## 2010-12-30 DIAGNOSIS — Z21 Asymptomatic human immunodeficiency virus [HIV] infection status: Secondary | ICD-10-CM

## 2010-12-30 MED ORDER — PNEUMOCOCCAL VAC POLYVALENT 25 MCG/0.5ML IJ INJ: 0.5000 mL | INJECTION | Freq: Once | INTRAMUSCULAR | Status: DC

## 2010-12-30 NOTE — Progress Notes (Signed)
  Subjective:    Patient ID: Jaime Morris, male    DOB: [birth year], 50 y.o.   MRN: [id number]  HPI presents to clinic today. Her scheduled followup. Endorses adherence to his antiretrovirals with good tolerance and no complications. Voices no physical complaints today.    Review of Systems  Constitutional: Negative.   HENT: Negative.   Eyes: Negative.   Respiratory: Negative.   Cardiovascular: Negative.   Gastrointestinal: Negative.   Genitourinary: Negative.   Musculoskeletal: Negative.   Skin: Negative.   Neurological: Negative.   Hematological: Negative.   Psychiatric/Behavioral: Negative.        Objective:   Physical Exam  Constitutional: He is oriented to person, place, and time. He appears well-developed and well-nourished.  HENT:  Head: Normocephalic and atraumatic.  Eyes: Conjunctivae and EOM are normal. Pupils are equal, round, and reactive to light.  Neck: Normal range of motion. Neck supple.  Cardiovascular: Normal rate, regular rhythm, normal heart sounds and intact distal pulses.   Pulmonary/Chest: Effort normal and breath sounds normal.  Abdominal: Soft. Bowel sounds are normal.  Musculoskeletal: Normal range of motion.  Neurological: He is alert and oriented to person, place, and time. No cranial nerve deficit. He exhibits normal muscle tone. Coordination normal.  Skin: Skin is warm and dry.  Psychiatric: He has a normal mood and affect. His behavior is normal. Judgment and thought content normal.          Assessment & Plan:  1. HIV. Labs drawn 12/16/2010. Show a CD4 count of 540 at 22% with a viral load less than 20 copies./ML. Clinically stable on current regimen. Recommend continuing present management, following up in 4 months with repeat fasting labs 2 weeks before next appointment.  2. Dyslipidemia. HDL showing marginal decline at 39 mg/dL, and LDL. She calculated at 133 mg/dL. At this point, medical intervention may not be necessary. Recommend a  carb restricted low-fat high-protein diet with increased exercise. Periods throughout the week. We'll repeat lipids on next blood draw.  Verbally acknowledged all information was provided to him and agreed with plan of care.

## 2011-02-26 ENCOUNTER — Other Ambulatory Visit: Payer: Self-pay | Admitting: Licensed Clinical Social Worker

## 2011-02-26 DIAGNOSIS — G4700 Insomnia, unspecified: Secondary | ICD-10-CM

## 2011-02-26 MED ORDER — ZOLPIDEM TARTRATE 10 MG PO TABS: 10.0000 mg | ORAL_TABLET | Freq: Every evening | ORAL | Status: DC | PRN

## 2011-06-14 ENCOUNTER — Other Ambulatory Visit: Payer: Self-pay | Admitting: Adult Health

## 2011-09-29 ENCOUNTER — Other Ambulatory Visit: Payer: 59

## 2011-09-29 ENCOUNTER — Other Ambulatory Visit: Payer: Self-pay | Admitting: Licensed Clinical Social Worker

## 2011-09-29 DIAGNOSIS — B20 Human immunodeficiency virus [HIV] disease: Secondary | ICD-10-CM

## 2011-09-29 DIAGNOSIS — E785 Hyperlipidemia, unspecified: Secondary | ICD-10-CM

## 2011-09-29 LAB — LIPID PANEL
HDL: 32 mg/dL — ABNORMAL LOW (ref >39)
LDL Cholesterol: 158 mg/dL — ABNORMAL HIGH (ref 0–99)
Total CHOL/HDL Ratio: 6.8 Ratio
VLDL: 26 mg/dL (ref 0–40)

## 2011-09-29 LAB — COMPLETE METABOLIC PANEL WITH GFR
ALT: 16 U/L (ref 0–53)
AST: 16 U/L (ref 0–37)
Alkaline Phosphatase: 94 U/L (ref 39–117)
Calcium: 9.2 mg/dL (ref 8.4–10.5)
Chloride: 106 mEq/L (ref 96–112)
Creat: 0.88 mg/dL (ref 0.50–1.35)
Potassium: 4.6 mEq/L (ref 3.5–5.3)

## 2011-09-29 LAB — CBC WITH DIFFERENTIAL/PLATELET
Basophils Absolute: 0 10*3/uL (ref 0.0–0.1)
Basophils Relative: 0 % (ref 0–1)
Lymphocytes Relative: 37 % (ref 12–46)
Neutro Abs: 3.4 10*3/uL (ref 1.7–7.7)
Platelets: 262 10*3/uL (ref 150–400)
RDW: 14.4 % (ref 11.5–15.5)
WBC: 6.3 10*3/uL (ref 4.0–10.5)

## 2011-09-30 LAB — T-HELPER CELL (CD4) - (RCID CLINIC ONLY): CD4 % Helper T Cell: 31 % — ABNORMAL LOW (ref 33–55)

## 2011-09-30 LAB — HIV-1 RNA QUANT-NO REFLEX-BLD: HIV-1 RNA Quant, Log: <1.3 {Log} (ref <1.30)

## 2011-10-13 ENCOUNTER — Ambulatory Visit (INDEPENDENT_AMBULATORY_CARE_PROVIDER_SITE_OTHER): Payer: 59 | Admitting: Internal Medicine

## 2011-10-13 ENCOUNTER — Encounter: Payer: Self-pay | Admitting: Internal Medicine

## 2011-10-13 VITALS — BP 129/76 | HR 83 | Temp 98.3°F | Wt 206.0 lb

## 2011-10-13 DIAGNOSIS — F17200 Nicotine dependence, unspecified, uncomplicated: Secondary | ICD-10-CM

## 2011-10-13 DIAGNOSIS — B20 Human immunodeficiency virus [HIV] disease: Secondary | ICD-10-CM

## 2011-10-13 NOTE — Progress Notes (Signed)
  Subjective:    Patient ID: Jaime Morris, male    DOB: [birth year], 51 y.o.   MRN: [id number]  HPI Comes in for followup of his HIV. He has a history of treatment with Sustiva and Combivir which was later changed to single tablet Atripla and he continues to take that well. In fact he reports only one missed dose since he started Atripla more than one year ago. He's had no significant side effects. He has been taking the medicine though he has not followed up since July of last year. He's had no issues since that time.  He had previously quit smoking though had restarted again and is back taking Chantix now with a plan to quit. Additionally he had a lipid panel done with [place], however he was not fasting. It does show a significantly elevated LDL, but again is not fasting. Otherwise he does follow up with his primary physician and had a screening colonoscopy 3 years ago. He does also complain of some shortness of breath related to his COPD, he did get a sample of Symbicort which he does feel improved his symptoms.   Review of Systems  Constitutional: Negative for fever, chills, fatigue and unexpected weight change.  HENT: Negative for sore throat and trouble swallowing.   Respiratory: Negative for cough, shortness of breath and wheezing.   Cardiovascular: Negative for chest pain, palpitations and leg swelling.  Gastrointestinal: Negative for nausea, abdominal pain and diarrhea.  Musculoskeletal: Negative for myalgias, joint swelling and arthralgias.  Skin: Negative for rash.  Neurological: Negative for dizziness, weakness and light-headedness.  Hematological: Negative for adenopathy.  Psychiatric/Behavioral: Negative for dysphoric mood. The patient is not nervous/anxious.        Objective:   Physical Exam  Constitutional: He appears well-developed and well-nourished. No distress.  HENT:  Mouth/Throat: Oropharynx is clear and moist. No oropharyngeal exudate.  Cardiovascular: Normal rate,  regular rhythm and normal heart sounds.  Exam reveals no gallop and no friction rub.   No murmur heard. Pulmonary/Chest: Effort normal and breath sounds normal. No respiratory distress. He has no wheezes. He has no rales.  Abdominal: Soft. Bowel sounds are normal. He exhibits no distension. There is no tenderness. There is no rebound.  Lymphadenopathy:    He has no cervical adenopathy.  Skin: No rash noted.          Assessment & Plan:

## 2011-10-13 NOTE — Assessment & Plan Note (Signed)
I encouraged cessation 

## 2011-10-13 NOTE — Assessment & Plan Note (Addendum)
He continues to do well with his HIV and will continue with Atripla. He knows to use condoms with all sexual activity.his last flu shot with [place] was in 2011 and he had a Pneumovax in 2012. His LDL was elevated at 158, however he was not fasting. I will send this to his primary physician who I believe also has done a fasting lipid panel. We'll defer any statin therapy to his primary physician.

## 2012-02-02 ENCOUNTER — Other Ambulatory Visit: Payer: 59

## 2012-02-02 DIAGNOSIS — B20 Human immunodeficiency virus [HIV] disease: Secondary | ICD-10-CM

## 2012-02-02 LAB — CBC WITH DIFFERENTIAL/PLATELET
Basophils Relative: 0 % (ref 0–1)
HCT: 43.9 % (ref 39.0–52.0)
Hemoglobin: 15.7 g/dL (ref 13.0–17.0)
MCH: 29.5 pg (ref 26.0–34.0)
MCHC: 35.8 g/dL (ref 30.0–36.0)
Monocytes Absolute: 0.4 10*3/uL (ref 0.1–1.0)
Monocytes Relative: 6 % (ref 3–12)
Neutro Abs: 4.9 10*3/uL (ref 1.7–7.7)

## 2012-02-02 LAB — COMPREHENSIVE METABOLIC PANEL
Albumin: 4.2 g/dL (ref 3.5–5.2)
Alkaline Phosphatase: 99 U/L (ref 39–117)
BUN: 11 mg/dL (ref 6–23)
Calcium: 9.4 mg/dL (ref 8.4–10.5)
Glucose, Bld: 101 mg/dL — ABNORMAL HIGH (ref 70–99)
Potassium: 5 mEq/L (ref 3.5–5.3)

## 2012-02-03 LAB — T-HELPER CELL (CD4) - (RCID CLINIC ONLY)
CD4 % Helper T Cell: 33 % (ref 33–55)
CD4 T Cell Abs: 770 uL (ref 400–2700)

## 2012-02-04 LAB — HIV-1 RNA QUANT-NO REFLEX-BLD: HIV-1 RNA Quant, Log: <1.3 {Log} (ref <1.30)

## 2012-02-16 ENCOUNTER — Encounter: Payer: Self-pay | Admitting: Internal Medicine

## 2012-02-16 ENCOUNTER — Ambulatory Visit (INDEPENDENT_AMBULATORY_CARE_PROVIDER_SITE_OTHER): Payer: 59 | Admitting: Internal Medicine

## 2012-02-16 VITALS — BP 157/82 | HR 86 | Temp 98.2°F | Ht 70.0 in | Wt 195.0 lb

## 2012-02-16 DIAGNOSIS — Z113 Encounter for screening for infections with a predominantly sexual mode of transmission: Secondary | ICD-10-CM

## 2012-02-16 DIAGNOSIS — F17200 Nicotine dependence, unspecified, uncomplicated: Secondary | ICD-10-CM

## 2012-02-16 DIAGNOSIS — B20 Human immunodeficiency virus [HIV] disease: Secondary | ICD-10-CM

## 2012-02-16 DIAGNOSIS — I10 Essential (primary) hypertension: Secondary | ICD-10-CM

## 2012-02-16 NOTE — Patient Instructions (Signed)
Continue your attempts to quit smoking.

## 2012-02-16 NOTE — Assessment & Plan Note (Signed)
I continue to encourage smoking cessation. I congratulated him on reducing his cigarettes. I did again reiterate that well-controlled HIV will not be a significant issue for him and his long-term outcome and that smoking is his biggest issue. The patient continues to be motivated to quit.

## 2012-02-16 NOTE — Progress Notes (Signed)
  Subjective:    Patient ID: Jaime Morris, male    DOB: 02/15/1961, 51 y.o.   MRN: [id number]  HPI He comes in for follow up of HIV. He continues to take a trip and denies any missed doses. He continues to have good tolerance of the medication and no recent hospitalizations or new issues. He is taking Chantix now and has reduced his cigarettes but has not completely quit. No complaints of shortness of breath or other issues today.   Review of Systems  Constitutional: Negative for fever, chills, appetite change and unexpected weight change.  HENT: Negative for sore throat and trouble swallowing.   Respiratory: Negative for cough, shortness of breath and wheezing.   Cardiovascular: Negative for chest pain, palpitations and leg swelling.  Gastrointestinal: Negative for nausea, abdominal pain and diarrhea.  Musculoskeletal: Negative for myalgias, joint swelling and arthralgias.  Skin: Negative for rash.  Neurological: Negative for dizziness and headaches.  Hematological: Negative for adenopathy.       Objective:   Physical Exam  Constitutional: He appears well-developed and well-nourished. No distress.  HENT:  Mouth/Throat: Oropharynx is clear and moist. No oropharyngeal exudate.  Cardiovascular: Normal rate and regular rhythm.   No murmur heard. Pulmonary/Chest: Effort normal and breath sounds normal. He has no wheezes.  Abdominal: Soft. Bowel sounds are normal. He exhibits no distension. There is no tenderness. There is no rebound.  Lymphadenopathy:    He has no cervical adenopathy.          Assessment & Plan:

## 2012-02-16 NOTE — Assessment & Plan Note (Signed)
Continuing to do well on his regimen we'll continue. He can return in 6 months for routine followup. He knows to use condoms with all sexual activity.

## 2012-02-16 NOTE — Assessment & Plan Note (Signed)
He does have elevated blood pressure today though states his other times normal. He has appoint with his primary physician he will continue to follow with him regarding his blood pressure.

## 2012-02-17 ENCOUNTER — Other Ambulatory Visit: Payer: Self-pay | Admitting: Infectious Diseases

## 2012-08-14 ENCOUNTER — Other Ambulatory Visit: Payer: Self-pay | Admitting: Infectious Diseases

## 2012-08-14 DIAGNOSIS — B20 Human immunodeficiency virus [HIV] disease: Secondary | ICD-10-CM

## 2012-08-17 ENCOUNTER — Ambulatory Visit (INDEPENDENT_AMBULATORY_CARE_PROVIDER_SITE_OTHER): Payer: 59 | Admitting: Licensed Clinical Social Worker

## 2012-08-17 ENCOUNTER — Other Ambulatory Visit: Payer: Self-pay | Admitting: Infectious Diseases

## 2012-08-17 ENCOUNTER — Other Ambulatory Visit (INDEPENDENT_AMBULATORY_CARE_PROVIDER_SITE_OTHER): Payer: 59

## 2012-08-17 DIAGNOSIS — B20 Human immunodeficiency virus [HIV] disease: Secondary | ICD-10-CM

## 2012-08-17 DIAGNOSIS — Z23 Encounter for immunization: Secondary | ICD-10-CM

## 2012-08-17 DIAGNOSIS — Z113 Encounter for screening for infections with a predominantly sexual mode of transmission: Secondary | ICD-10-CM

## 2012-08-17 LAB — COMPLETE METABOLIC PANEL WITH GFR
AST: 19 U/L (ref 0–37)
Alkaline Phosphatase: 82 U/L (ref 39–117)
BUN: 13 mg/dL (ref 6–23)
GFR, Est Non African American: >89 mL/min
Glucose, Bld: 91 mg/dL (ref 70–99)
Potassium: 4.7 mEq/L (ref 3.5–5.3)
Total Bilirubin: 0.3 mg/dL (ref 0.3–1.2)

## 2012-08-17 LAB — CBC WITH DIFFERENTIAL/PLATELET
Basophils Absolute: 0 10*3/uL (ref 0.0–0.1)
Basophils Relative: 0 % (ref 0–1)
Eosinophils Absolute: 0.1 10*3/uL (ref 0.0–0.7)
HCT: 43.1 % (ref 39.0–52.0)
Hemoglobin: 15.3 g/dL (ref 13.0–17.0)
MCH: 29 pg (ref 26.0–34.0)
MCHC: 35.5 g/dL (ref 30.0–36.0)
Monocytes Absolute: 0.5 10*3/uL (ref 0.1–1.0)
Monocytes Relative: 7 % (ref 3–12)
Neutro Abs: 4.6 10*3/uL (ref 1.7–7.7)
Neutrophils Relative %: 58 % (ref 43–77)
RDW: 14.7 % (ref 11.5–15.5)

## 2012-08-17 LAB — RPR

## 2012-08-18 ENCOUNTER — Other Ambulatory Visit: Payer: 59

## 2012-08-18 LAB — T-HELPER CELL (CD4) - (RCID CLINIC ONLY)
CD4 % Helper T Cell: 33 % (ref 33–55)
CD4 T Cell Abs: 920 uL (ref 400–2700)

## 2012-08-18 LAB — HIV-1 RNA QUANT-NO REFLEX-BLD
HIV 1 RNA Quant: <20 copies/mL (ref <20)
HIV-1 RNA Quant, Log: <1.3 {Log} (ref <1.30)

## 2012-08-26 ENCOUNTER — Other Ambulatory Visit: Payer: Self-pay | Admitting: *Deleted

## 2012-08-26 DIAGNOSIS — B20 Human immunodeficiency virus [HIV] disease: Secondary | ICD-10-CM

## 2012-08-26 MED ORDER — EFAVIRENZ-EMTRICITAB-TENOFOVIR 600-200-300 MG PO TABS: 1.0000 | ORAL_TABLET | Freq: Every day | ORAL | Status: DC

## 2012-08-26 NOTE — Telephone Encounter (Signed)
Per patient insurance this patient needs 90 day supply at a time. Resent the Rx per insurance request.

## 2012-09-01 ENCOUNTER — Ambulatory Visit: Payer: 59 | Admitting: Internal Medicine

## 2012-10-05 ENCOUNTER — Encounter: Payer: Self-pay | Admitting: Internal Medicine

## 2012-10-05 ENCOUNTER — Ambulatory Visit (INDEPENDENT_AMBULATORY_CARE_PROVIDER_SITE_OTHER): Payer: 59 | Admitting: Internal Medicine

## 2012-10-05 VITALS — BP 151/78 | HR 94 | Temp 98.4°F | Wt 209.0 lb

## 2012-10-05 DIAGNOSIS — F17200 Nicotine dependence, unspecified, uncomplicated: Secondary | ICD-10-CM

## 2012-10-05 DIAGNOSIS — B20 Human immunodeficiency virus [HIV] disease: Secondary | ICD-10-CM

## 2012-10-05 NOTE — Progress Notes (Signed)
  Subjective:    Patient ID: Jaime Morris, male    DOB: February 22, 1961, 52 y.o.   MRN: [id number]  HPI Doing well and no new issues since his last visit.  On Atripla and denies any missed doses until yesterday when he was taking xanax and read that he should not take both, so took the xanax only.  He though remains undetectable and good CD4 count.  No hospitalizations.  Unofortunately, restarted smoking, though is to see his PCP to get help to quit again.     Review of Systems  Constitutional: Negative for fever, appetite change and unexpected weight change.       Has gained some weight  HENT: Negative for sore throat and trouble swallowing.   Respiratory: Negative for shortness of breath.   Gastrointestinal: Negative for nausea, abdominal pain and diarrhea.  Skin: Negative for rash.  Neurological: Negative for dizziness and headaches.       Objective:   Physical Exam  Constitutional: He appears well-developed and well-nourished. No distress.  Cardiovascular: Normal rate, regular rhythm and normal heart sounds.  Exam reveals no gallop and no friction rub.   No murmur heard. Pulmonary/Chest: Effort normal and breath sounds normal. No respiratory distress. He has no wheezes. He has no rales.          Assessment & Plan:

## 2012-10-05 NOTE — Assessment & Plan Note (Signed)
Doing well.  Discussed compliance.  Also healthy habits (eating, smoking).  RTC 6 months.

## 2012-10-05 NOTE — Assessment & Plan Note (Signed)
Encouraged cessation.

## 2013-02-07 ENCOUNTER — Encounter (HOSPITAL_COMMUNITY): Payer: Self-pay

## 2013-02-07 ENCOUNTER — Emergency Department (HOSPITAL_COMMUNITY)
Admission: EM | Admit: 2013-02-07 | Discharge: 2013-02-08 | Disposition: A | Discharge status: Home / Self Care | Payer: 59 | Attending: Emergency Medicine | Admitting: Emergency Medicine

## 2013-02-07 DIAGNOSIS — Y9389 Activity, other specified: Secondary | ICD-10-CM | POA: Insufficient documentation

## 2013-02-07 DIAGNOSIS — Z79899 Other long term (current) drug therapy: Secondary | ICD-10-CM | POA: Insufficient documentation

## 2013-02-07 DIAGNOSIS — Y92009 Unspecified place in unspecified non-institutional (private) residence as the place of occurrence of the external cause: Secondary | ICD-10-CM | POA: Insufficient documentation

## 2013-02-07 DIAGNOSIS — F17200 Nicotine dependence, unspecified, uncomplicated: Secondary | ICD-10-CM | POA: Insufficient documentation

## 2013-02-07 DIAGNOSIS — Z21 Asymptomatic human immunodeficiency virus [HIV] infection status: Secondary | ICD-10-CM | POA: Insufficient documentation

## 2013-02-07 DIAGNOSIS — E785 Hyperlipidemia, unspecified: Secondary | ICD-10-CM | POA: Insufficient documentation

## 2013-02-07 DIAGNOSIS — S61219A Laceration without foreign body of unspecified finger without damage to nail, initial encounter: Secondary | ICD-10-CM

## 2013-02-07 DIAGNOSIS — S61209A Unspecified open wound of unspecified finger without damage to nail, initial encounter: Secondary | ICD-10-CM | POA: Insufficient documentation

## 2013-02-07 DIAGNOSIS — W268XXA Contact with other sharp object(s), not elsewhere classified, initial encounter: Secondary | ICD-10-CM | POA: Insufficient documentation

## 2013-02-07 HISTORY — DX: Insomnia, unspecified: G47.00

## 2013-02-07 NOTE — ED Notes (Signed)
Per pt, getting bowl out of cabinet.  Bowl broke and cut right index finger.

## 2013-02-08 NOTE — ED Provider Notes (Signed)
CSN: [id number]     Arrival date & time 02/07/13  2305 History   First MD Initiated Contact with Patient 02/07/13 2345     Chief Complaint  Patient presents with  . Extremity Laceration   HPI  History provided by the patient. The patient is a 52 year old male with history of HIV and hyperlipidemia who presents with complaints of laceration to his left index finger. Patient states he was reaching for mixing bowls on his cabinet when one fell and broke. He reports there are very large pieces of glass which he cut his left index finger against. He does not believe there were any small pieces of glass. He had lacerations over the knuckles that are difficult to stop bleeding. He was able to put pressure in the listening above his head with some improvement. He did rinse his finger with some water. He denied using treatments for his symptoms. He denies any weakness or numbness to the fingertip. Patient is currently on his tetanus within the last 10 years. Denies any other injuries or complaints.    Past Medical History  Diagnosis Date  . HIV infection   . Hyperlipidemia   . Insomnia    Past Surgical History  Procedure Laterality Date  . Foot surgery    . Back surgery     Family History  Problem Relation Age of Onset  . Stroke Mother   . Heart disease Father    History  Substance Use Topics  . Smoking status: Current Some Day Smoker -- 1.00 packs/day    Types: Cigarettes  . Smokeless tobacco: Never Used  . Alcohol Use: Yes     Comment: social    Review of Systems  Neurological: Negative for weakness and numbness.  All other systems reviewed and are negative.    Allergies  Review of patient's allergies indicates no known allergies.  Home Medications   Current Outpatient Rx  Name  Route  Sig  Dispense  Refill  . albuterol (PROAIR HFA) 108 (90 BASE) MCG/ACT inhaler   Inhalation   Inhale 2 puffs into the lungs as needed.           Marland Kitchen efavirenz-emtricitabine-tenofovir  (ATRIPLA) 600-200-300 MG per tablet   Oral   Take 1 tablet by mouth daily.   90 tablet   3   . Multiple Vitamin (MULTIVITAMIN WITH MINERALS) TABS tablet   Oral   Take 1 tablet by mouth daily.         Marland Kitchen zolpidem (AMBIEN) 10 MG tablet   Oral   Take 1 tablet (10 mg total) by mouth at bedtime as needed.   30 tablet   3   . varenicline (CHANTIX PAK) 0.5 MG X 11 & 1 MG X 42 tablet   Oral   Take 0.5 mg by mouth 2 (two) times daily. Take one 0.5mg  tablet by mouth once daily for 3 days, then increase to one 0.5mg  tablet twice daily for 3 days, then increase to one 1mg  tablet twice daily.           BP 150/77  Pulse 108  Temp(Src) 99.1 F (37.3 C) (Oral)  Resp 18  SpO2 98% Physical Exam  Nursing note and vitals reviewed. Constitutional: He is oriented to person, place, and time. He appears well-developed and well-nourished. No distress.  HENT:  Head: Normocephalic.  Cardiovascular: Normal rate and regular rhythm.   Pulmonary/Chest: Effort normal and breath sounds normal. No respiratory distress. He has no wheezes.  Abdominal: Soft.  Musculoskeletal:  Small irregular laceration over the dorsal PIP of the left second finger. Small amount of bleeding. There is a linear laceration over the left second dorsal DIP. No bleeding. Normal strength against resistance in all directions of the finger. No tendon involvement on exploration. Normal sensation to light touch to the medial and lateral aspects to the tip of the finger. Normal cap refill less than 2 seconds.  Neurological: He is alert and oriented to person, place, and time.  Skin: Skin is warm.  Psychiatric: He has a normal mood and affect. His behavior is normal.    ED Course  Procedures   LACERATION REPAIR Performed by: Angus Seller Authorized by: Angus Seller Consent: Verbal consent obtained. Risks and benefits: risks, benefits and alternatives were discussed Consent given by: patient Patient identity confirmed:  provided demographic data Prepped and Draped in normal sterile fashion Wound explored  Laceration Location: Left index finger  Laceration Length: 4 cm  No Foreign Bodies seen or palpated  Anesthesia: digital block  Local anesthetic: lidocaine 1% without epinephrine  Anesthetic total: 6 ml  Irrigation method: syringe Amount of cleaning: standard  Skin closure: Skin with 5-0 Vicryl   Number of sutures: 5   Technique: Simple unerupted   Patient tolerance: Patient tolerated the procedure well with no immediate complications.       MDM   1. Laceration of finger of left hand, initial encounter      Patient seen and evaluated. Patient appears well in no acute distress.    Angus Seller, PA-C 02/08/13 [phone]

## 2013-02-08 NOTE — ED Provider Notes (Signed)
Medical screening examination/treatment/procedure(s) were performed by non-physician practitioner and as supervising physician I was immediately available for consultation/collaboration.  Alaia Lordi, MD 02/08/13 0622 

## 2013-03-15 ENCOUNTER — Ambulatory Visit
Admission: RE | Admit: 2013-03-15 | Discharge: 2013-03-15 | Disposition: A | Discharge status: Home / Self Care | Payer: No Typology Code available for payment source | Source: Ambulatory Visit | Attending: General Practice | Admitting: General Practice

## 2013-03-15 ENCOUNTER — Other Ambulatory Visit: Payer: Self-pay | Admitting: General Practice

## 2013-03-15 DIAGNOSIS — R7611 Nonspecific reaction to tuberculin skin test without active tuberculosis: Secondary | ICD-10-CM

## 2013-04-04 ENCOUNTER — Other Ambulatory Visit: Payer: 59

## 2013-04-18 ENCOUNTER — Ambulatory Visit: Payer: 59 | Admitting: Internal Medicine

## 2013-04-20 ENCOUNTER — Encounter: Payer: Self-pay | Admitting: Internal Medicine

## 2013-04-20 ENCOUNTER — Ambulatory Visit (INDEPENDENT_AMBULATORY_CARE_PROVIDER_SITE_OTHER): Payer: 59 | Admitting: Internal Medicine

## 2013-04-20 VITALS — BP 130/71 | HR 83 | Temp 97.6°F | Ht 70.0 in | Wt 203.0 lb

## 2013-04-20 DIAGNOSIS — Z79899 Other long term (current) drug therapy: Secondary | ICD-10-CM

## 2013-04-20 DIAGNOSIS — B20 Human immunodeficiency virus [HIV] disease: Secondary | ICD-10-CM

## 2013-04-20 DIAGNOSIS — F1021 Alcohol dependence, in remission: Secondary | ICD-10-CM

## 2013-04-20 DIAGNOSIS — Z23 Encounter for immunization: Secondary | ICD-10-CM

## 2013-04-20 DIAGNOSIS — J449 Chronic obstructive pulmonary disease, unspecified: Secondary | ICD-10-CM

## 2013-04-20 DIAGNOSIS — Z113 Encounter for screening for infections with a predominantly sexual mode of transmission: Secondary | ICD-10-CM

## 2013-04-20 MED ORDER — EFAVIRENZ-EMTRICITAB-TENOFOVIR 600-200-300 MG PO TABS: 1.0000 | ORAL_TABLET | Freq: Every day | ORAL | Status: DC

## 2013-04-20 NOTE — Assessment & Plan Note (Signed)
Continuing outpatient therapy and vitamins.

## 2013-04-20 NOTE — Assessment & Plan Note (Signed)
No new symptoms.

## 2013-04-20 NOTE — Assessment & Plan Note (Addendum)
Doing well.  Labs today and will follow on [facility].  RTC in 6 months for fasting labs.

## 2013-04-20 NOTE — Progress Notes (Signed)
  Subjective:    Patient ID: Jaime Morris, male    DOB: Sep 26, 1960, 52 y.o.   MRN: [id number]  HPI  Doing well and no new issues since his last visit.  On Atripla and denies any missed doses.  He though remains undetectable and good CD4 count.  No hospitalizations.  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  Feels well.. Never had any issue taking Atripla.  No diarrhea.      Review of Systems  Constitutional: Negative for fever, appetite change and unexpected weight change.       Has gained some weight  HENT: Negative for sore throat and trouble swallowing.   Eyes: Negative for visual disturbance.  Respiratory: Negative for shortness of breath.   Gastrointestinal: Negative for nausea, abdominal pain and diarrhea.  Musculoskeletal: Negative for gait problem.  Skin: Negative for rash.  Neurological: Negative for dizziness and headaches.  Hematological: Negative for adenopathy.  Psychiatric/Behavioral: Negative for dysphoric mood.       Objective:   Physical Exam  Constitutional: He is oriented to person, place, and time. He appears well-developed and well-nourished. No distress.  HENT:  Mouth/Throat: No oropharyngeal exudate.  Eyes: Right eye exhibits no discharge. Left eye exhibits no discharge. No scleral icterus.  Cardiovascular: Normal rate, regular rhythm and normal heart sounds.  Exam reveals no gallop and no friction rub.   No murmur heard. Pulmonary/Chest: Effort normal and breath sounds normal. No respiratory distress. He has no wheezes. He has no rales.  Lymphadenopathy:    He has no cervical adenopathy.  Neurological: He is alert and oriented to person, place, and time.  Skin: No rash noted.  Psychiatric: He has a normal mood and affect.          Assessment & Plan:

## 2013-04-21 LAB — T-HELPER CELL (CD4) - (RCID CLINIC ONLY)
CD4 % Helper T Cell: 37 % (ref 33–55)
CD4 T Cell Abs: 1010 /uL (ref 400–2700)

## 2013-10-12 ENCOUNTER — Other Ambulatory Visit: Payer: Self-pay | Admitting: Internal Medicine

## 2013-10-12 DIAGNOSIS — Z0000 Encounter for general adult medical examination without abnormal findings: Secondary | ICD-10-CM

## 2013-10-12 DIAGNOSIS — N63 Unspecified lump in unspecified breast: Secondary | ICD-10-CM

## 2013-10-12 DIAGNOSIS — Z87891 Personal history of nicotine dependence: Secondary | ICD-10-CM

## 2013-10-16 DIAGNOSIS — N62 Hypertrophy of breast: Secondary | ICD-10-CM

## 2013-10-16 HISTORY — DX: Hypertrophy of breast: N62

## 2013-10-20 ENCOUNTER — Other Ambulatory Visit: Payer: 59

## 2013-10-23 ENCOUNTER — Other Ambulatory Visit: Payer: No Typology Code available for payment source

## 2013-10-23 DIAGNOSIS — Z79899 Other long term (current) drug therapy: Secondary | ICD-10-CM

## 2013-10-23 DIAGNOSIS — Z113 Encounter for screening for infections with a predominantly sexual mode of transmission: Secondary | ICD-10-CM

## 2013-10-23 DIAGNOSIS — B20 Human immunodeficiency virus [HIV] disease: Secondary | ICD-10-CM

## 2013-10-23 LAB — CBC WITH DIFFERENTIAL/PLATELET
BASOS ABS: 0.1 10*3/uL (ref 0.0–0.1)
BASOS PCT: 1 % (ref 0–1)
EOS ABS: 0.2 10*3/uL (ref 0.0–0.7)
Eosinophils Relative: 3 % (ref 0–5)
HCT: 43.8 % (ref 39.0–52.0)
HEMOGLOBIN: 15.6 g/dL (ref 13.0–17.0)
Lymphocytes Relative: 32 % (ref 12–46)
Lymphs Abs: 2 10*3/uL (ref 0.7–4.0)
MCH: 29.3 pg (ref 26.0–34.0)
MCHC: 35.6 g/dL (ref 30.0–36.0)
MCV: 82.3 fL (ref 78.0–100.0)
MONOS PCT: 8 % (ref 3–12)
Monocytes Absolute: 0.5 10*3/uL (ref 0.1–1.0)
NEUTROS ABS: 3.4 10*3/uL (ref 1.7–7.7)
NEUTROS PCT: 56 % (ref 43–77)
Platelets: 239 10*3/uL (ref 150–400)
RBC: 5.32 MIL/uL (ref 4.22–5.81)
RDW: 15.7 % — AB (ref 11.5–15.5)
WBC: 6.1 10*3/uL (ref 4.0–10.5)

## 2013-10-23 LAB — COMPLETE METABOLIC PANEL WITH GFR
ALT: 21 U/L (ref 0–53)
AST: 18 U/L (ref 0–37)
Albumin: 4.1 g/dL (ref 3.5–5.2)
Alkaline Phosphatase: 90 U/L (ref 39–117)
BUN: 10 mg/dL (ref 6–23)
CO2: 24 meq/L (ref 19–32)
Calcium: 9.5 mg/dL (ref 8.4–10.5)
Chloride: 105 mEq/L (ref 96–112)
Creat: 0.79 mg/dL (ref 0.50–1.35)
GFR, Est Non African American: >89 mL/min
Glucose, Bld: 100 mg/dL — ABNORMAL HIGH (ref 70–99)
Potassium: 4.9 mEq/L (ref 3.5–5.3)
SODIUM: 137 meq/L (ref 135–145)
TOTAL PROTEIN: 7.2 g/dL (ref 6.0–8.3)
Total Bilirubin: 0.4 mg/dL (ref 0.2–1.2)

## 2013-10-23 LAB — LIPID PANEL
Cholesterol: 198 mg/dL (ref 0–200)
HDL: 54 mg/dL (ref >39)
LDL Cholesterol: 129 mg/dL — ABNORMAL HIGH (ref 0–99)
TRIGLYCERIDES: 76 mg/dL (ref <150)
Total CHOL/HDL Ratio: 3.7 Ratio
VLDL: 15 mg/dL (ref 0–40)

## 2013-10-23 LAB — RPR

## 2013-10-24 ENCOUNTER — Ambulatory Visit
Admission: RE | Admit: 2013-10-24 | Discharge: 2013-10-24 | Disposition: A | Discharge status: Home / Self Care | Payer: No Typology Code available for payment source | Source: Ambulatory Visit | Attending: Internal Medicine | Admitting: Internal Medicine

## 2013-10-24 DIAGNOSIS — Z87891 Personal history of nicotine dependence: Secondary | ICD-10-CM

## 2013-10-24 DIAGNOSIS — R918 Other nonspecific abnormal finding of lung field: Secondary | ICD-10-CM

## 2013-10-24 HISTORY — DX: Other nonspecific abnormal finding of lung field: R91.8

## 2013-10-24 LAB — URINE CYTOLOGY ANCILLARY ONLY
Chlamydia: NEGATIVE
NEISSERIA GONORRHEA: NEGATIVE

## 2013-10-24 LAB — T-HELPER CELL (CD4) - (RCID CLINIC ONLY)
CD4 T CELL ABS: 840 /uL (ref 400–2700)
CD4 T CELL HELPER: 38 % (ref 33–55)

## 2013-10-25 ENCOUNTER — Ambulatory Visit
Admission: RE | Admit: 2013-10-25 | Discharge: 2013-10-25 | Disposition: A | Discharge status: Home / Self Care | Payer: 59 | Source: Ambulatory Visit | Attending: Internal Medicine | Admitting: Internal Medicine

## 2013-10-25 DIAGNOSIS — N63 Unspecified lump in unspecified breast: Secondary | ICD-10-CM

## 2013-10-25 LAB — HIV-1 RNA QUANT-NO REFLEX-BLD
HIV 1 RNA Quant: <20 copies/mL (ref <20)
HIV-1 RNA Quant, Log: <1.3 {Log} (ref <1.30)

## 2013-10-26 ENCOUNTER — Other Ambulatory Visit (HOSPITAL_COMMUNITY): Payer: Self-pay

## 2013-10-26 ENCOUNTER — Other Ambulatory Visit (HOSPITAL_COMMUNITY): Payer: Self-pay | Admitting: Internal Medicine

## 2013-10-26 DIAGNOSIS — R911 Solitary pulmonary nodule: Secondary | ICD-10-CM

## 2013-10-31 ENCOUNTER — Ambulatory Visit (HOSPITAL_COMMUNITY)
Admission: RE | Admit: 2013-10-31 | Discharge: 2013-10-31 | Disposition: A | Discharge status: Home / Self Care | Payer: 59 | Source: Ambulatory Visit | Attending: Internal Medicine | Admitting: Internal Medicine

## 2013-10-31 DIAGNOSIS — R911 Solitary pulmonary nodule: Secondary | ICD-10-CM | POA: Insufficient documentation

## 2013-10-31 DIAGNOSIS — F17200 Nicotine dependence, unspecified, uncomplicated: Secondary | ICD-10-CM | POA: Insufficient documentation

## 2013-10-31 MED ORDER — ALBUTEROL SULFATE (2.5 MG/3ML) 0.083% IN NEBU
2.5000 mg | INHALATION_SOLUTION | Freq: Once | RESPIRATORY_TRACT | Status: AC
  Administered 2013-10-31: 2.5 mg via RESPIRATORY_TRACT

## 2013-11-01 ENCOUNTER — Encounter (HOSPITAL_COMMUNITY)
Admission: RE | Admit: 2013-11-01 | Discharge: 2013-11-01 | Disposition: A | Discharge status: Home / Self Care | Payer: 59 | Source: Ambulatory Visit | Attending: Internal Medicine | Admitting: Internal Medicine

## 2013-11-01 ENCOUNTER — Encounter: Payer: Self-pay | Admitting: *Deleted

## 2013-11-01 ENCOUNTER — Institutional Professional Consult (permissible substitution) (INDEPENDENT_AMBULATORY_CARE_PROVIDER_SITE_OTHER): Payer: 59 | Admitting: Cardiothoracic Surgery

## 2013-11-01 ENCOUNTER — Other Ambulatory Visit: Payer: Self-pay | Admitting: *Deleted

## 2013-11-01 VITALS — BP 158/86 | HR 97 | Resp 16 | Ht 69.5 in | Wt 188.0 lb

## 2013-11-01 DIAGNOSIS — R911 Solitary pulmonary nodule: Secondary | ICD-10-CM | POA: Insufficient documentation

## 2013-11-01 DIAGNOSIS — N62 Hypertrophy of breast: Secondary | ICD-10-CM | POA: Insufficient documentation

## 2013-11-01 DIAGNOSIS — R918 Other nonspecific abnormal finding of lung field: Secondary | ICD-10-CM

## 2013-11-01 DIAGNOSIS — J439 Emphysema, unspecified: Secondary | ICD-10-CM | POA: Insufficient documentation

## 2013-11-01 DIAGNOSIS — I10 Essential (primary) hypertension: Secondary | ICD-10-CM

## 2013-11-01 LAB — GLUCOSE, CAPILLARY: GLUCOSE-CAPILLARY: 112 mg/dL — AB (ref 70–99)

## 2013-11-01 MED ORDER — FLUDEOXYGLUCOSE F - 18 (FDG) INJECTION
9.7000 | Freq: Once | INTRAVENOUS | Status: AC | PRN
  Administered 2013-11-01: 9.7 via INTRAVENOUS

## 2013-11-02 ENCOUNTER — Ambulatory Visit: Payer: 59 | Admitting: Internal Medicine

## 2013-11-02 LAB — PULMONARY FUNCTION TEST
DL/VA % pred: 60 %
DL/VA: 2.79 ml/min/mmHg/L
DLCO COR % PRED: 64 %
DLCO UNC: 21.26 ml/min/mmHg
DLCO cor: 20.7 ml/min/mmHg
DLCO unc % pred: 65 %
FEF 25-75 Post: 1.58 L/sec
FEF 25-75 Pre: 1.67 L/sec
FEF2575-%CHANGE-POST: -5 %
FEF2575-%Pred-Post: 50 %
FEF2575-%Pred-Pre: 53 %
FEV1-%CHANGE-POST: -3 %
FEV1-%PRED-POST: 98 %
FEV1-%PRED-PRE: 101 %
FEV1-POST: 3.18 L
FEV1-Pre: 3.28 L
FEV1FVC-%CHANGE-POST: 0 %
FEV1FVC-%Pred-Pre: 80 %
FEV6-%Change-Post: -3 %
FEV6-%PRED-PRE: 127 %
FEV6-%Pred-Post: 123 %
FEV6-PRE: 5.07 L
FEV6-Post: 4.9 L
FEV6FVC-%Change-Post: 0 %
FEV6FVC-%PRED-POST: 100 %
FEV6FVC-%PRED-PRE: 101 %
FVC-%Change-Post: -3 %
FVC-%Pred-Post: 121 %
FVC-%Pred-Pre: 125 %
FVC-Post: 5 L
FVC-Pre: 5.17 L
POST FEV1/FVC RATIO: 63 %
POST FEV6/FVC RATIO: 98 %
PRE FEV6/FVC RATIO: 98 %
Pre FEV1/FVC ratio: 63 %
RV % PRED: 108 %
RV: 2.33 L
TLC % PRED: 110 %
TLC: 7.71 L

## 2013-11-02 NOTE — Progress Notes (Signed)
[name] [address]             [phone]                    Miken A Rahal Kirwin Medical Record #[id number] Date of Birth: [birth year]  Referring: Irven Shelling, MD Primary Care: Irven Shelling, MD  Chief Complaint:    Chief Complaint  Patient presents with  . Lung Lesion    multiple...CT CHEST W/O, PET, PFT'S    History of Present Illness:    Jaime Morris 53 y.o. male is seen in the office  today for abnormal lung cancer screening CT. Patient has been long term smoker . He has been followed for COPD but is not significantly limited in his respiratory reserve. He is followed in the infectious disease clinic for HIV, with undetectable counts noted in November of 2014.    He denies any history of hemoptysis.   Current Activity/ Functional Status:  Patient is independent with mobility/ambulation, transfers, ADL's, IADL's.   Zubrod Score: At the time of surgery this patient's most appropriate activity status/level should be described as: [x]     0    Normal activity, no symptoms []     1    Restricted in physical strenuous activity but ambulatory, able to do out light work []     2    Ambulatory and capable of self care, unable to do work activities, up and about               >50 % of waking hours                              []     3    Only limited self care, in bed greater than 50% of waking hours []     4    Completely disabled, no self care, confined to bed or chair []     5    Moribund   Past Medical History  Diagnosis Date  . HIV infection     DR. [name]  . Hyperlipidemia   . Insomnia   . Lung nodule, multiple 10/24/13    CT CHEST W/O...DR. [name]  . Gynecomastia, male 10/16/13    MILD RIGHT SIDED PER RADIOLOGY STUDY  . COPD (chronic obstructive pulmonary disease)     PFT'S 10/26/13 @ [facility]    Past Surgical History  Procedure Laterality Date  . Foot surgery      BUNIONECTOMY  . Back  surgery      L 4-5 FUSION WITH SCREW  . Colonoscopy  UTD    Family History  Problem Relation Age of Onset  . Stroke Mother   . Heart disease Father   . Sarcoidosis Sister     History   Social History  . Marital Status: Single    Spouse Name: N/A    Number of Children: N/A  . Years of Education: N/A   Occupational History  . Not on file.   Social History Main Topics  . Smoking status: Current Every Day Smoker -- 1.00 packs/day for 38 years    Types: Cigarettes  . Smokeless tobacco: Current User    Types: Chew  . Alcohol Use: No     Comment: in alcohol treatment/04/20/13  . Drug Use: No  . Sexual Activity: No     Comment:  pt. declined condoms   Other Topics Concern  . Works from home , Therapist, art for [employer]           History  Smoking status  . Current Every Day Smoker -- 1.00 packs/day for 38 years  . Types: Cigarettes  Smokeless tobacco  . Current User  . Types: Chew    History  Alcohol Use No    Comment: in alcohol treatment/04/20/13     No Known Allergies  Current Outpatient Prescriptions  Medication Sig Dispense Refill  . albuterol (PROAIR HFA) 108 (90 BASE) MCG/ACT inhaler Inhale 2 puffs into the lungs every 4 (four) hours as needed.       Marland Kitchen efavirenz-emtricitabine-tenofovir (ATRIPLA) 600-200-300 MG per tablet Take 1 tablet by mouth daily.  90 tablet  3  . fluticasone (FLONASE) 50 MCG/ACT nasal spray Place 2 sprays into both nostrils daily.      . Melatonin 3 MG TABS Take 1 tablet by mouth at bedtime as needed.      . traZODone (DESYREL) 100 MG tablet Take 100 mg by mouth at bedtime.      . varenicline (CHANTIX PAK) 0.5 MG X 11 & 1 MG X 42 tablet Take 0.5 mg by mouth 2 (two) times daily. Take one 0.5mg  tablet by mouth once daily for 3 days, then increase to one 0.5mg  tablet twice daily for 3 days, then increase to one 1mg  tablet twice daily.        No current facility-administered medications for this visit.     Review of Systems:      Cardiac Review of Systems: Y or N  Chest Pain [ n   ]  Resting SOB [ n  ] Exertional SOB  Blue.Reese  ]  Orthopnea Florencio.Farrier  ]   Pedal Edema [ n  ]    Palpitations Florencio.Farrier  ] Syncope  [ n ]   Presyncope [  n ]  General Review of Systems: [Y] = yes [  ]=no Constitional: recent weight change [ n ];  Wt loss over the last 3 months [   ] anorexia [  ]; fatigue [  ]; nausea [  ]; night sweats [  ]; fever [  ]; or chills [  ];          Dental: poor dentition[ n ]; Last Dentist visit:   Eye : blurred vision [n  ]; diplopia [   ]; vision changes [  ];  Amaurosis fugax[  ]; Resp: cough Blue.Reese  ];  wheezing[ n ];  hemoptysis[n  ]; shortness of breath[y  ]; paroxysmal nocturnal dyspnea[  ]; dyspnea on exertion[ y ]; or orthopnea[  ];  GI:  gallstones[  ], vomiting[  ];  dysphagia[  ]; melena[  ];  hematochezia [  ]; heartburn[  ];   Hx of  Colonoscopy[  ]; GU: kidney stones [  ]; hematuria[  ];   dysuria [  ];  nocturia[ y ];  history of     obstruction [  ]; urinary frequency [ n ]             Skin: rash, swelling[  ];, hair loss[  ];  peripheral edema[  ];  or itching[  ]; Musculosketetal: myalgias[  ];  joint swelling[  ];  joint erythema[  ];  joint pain[  ];  back pain[  ];  Heme/Lymph: bruising[  ];  bleeding[  ];  anemia[  ];  Neuro: TIA[  ];  headaches[  ];  stroke[  ];  vertigo[  ];  seizures[ n ];   paresthesias[  ];  difficulty walking[n  ];  Psych:depression[  ]; anxiety[  ];  Endocrine: diabetes[n  ];  thyroid dysfunction[ n ];  Immunizations: Flu up to date Blue.Reese  ]; Pneumococcal up to date [ y ];  Other:  Physical Exam: BP 158/86  Pulse 97  Resp 16  Ht 5' 9.5" (1.765 m)  Wt 188 lb (85.276 kg)  BMI 27.37 kg/m2  SpO2 96%  PHYSICAL EXAMINATION:  General appearance: alert, cooperative, appears stated age and no distress Neurologic: intact Heart: regular rate and rhythm, S1, S2 normal, no murmur, click, rub or gallop Lungs: clear to auscultation bilaterally Abdomen: soft, non-tender; bowel sounds  normal; no masses,  no organomegaly Extremities: extremities normal, atraumatic, no cyanosis or edema and Homans sign is negative, no sign of DVT Patient has carotid bruits, no cervical or supraclavicular or axillary adenopathy  Diagnostic Studies & Laboratory data:     Recent Radiology Findings:   Nm Pet Image Initial (pi) Skull Base To Thigh  11/01/2013   CLINICAL DATA:  Initial treatment strategy for the lung nodule.  EXAM: NUCLEAR MEDICINE PET SKULL BASE TO THIGH  TECHNIQUE: 9.7 mCi F-18 FDG was injected intravenously. Full-ring PET imaging was performed from the skull base to thigh after the radiotracer. CT data was obtained and used for attenuation correction and anatomic localization.  FASTING BLOOD GLUCOSE:  Value: 112 mg/dl  COMPARISON:  10/24/2013  FINDINGS: NECK  No hypermetabolic lymph nodes in the neck. Chronic ethmoid, left sphenoid, and bilateral maxillary sinusitis. The  CHEST  The 1.4 cm right upper lobe pulmonary nodule has a maximum standard uptake value of 7.5. Conglomerate right hilar and infrahilar adenopathy maximum standard uptake value 10.1. Mildly hypermetabolic left infrahilar lymph node, maximum standard uptake value 3.2.  Severe emphysema noted. Faint nodularity anteriorly in the lingula is not hypermetabolic but the tiny nodules are below sensitive PET-CT size thresholds. Incidental partial anomalous pulmonary venous return, left upper lobe.  ABDOMEN/PELVIS  Hypermetabolic lateral limb of the right adrenal gland with only equivocal thickening in this vicinity on the CT data. Maximum standard uptake value 5.0.  Enlarged prostate gland, 6.0 by 4.8 cm.  SKELETON  Negative for malignancy. Grade 2 anterolisthesis at L5-S1 with posterolateral rod and pedicle screw fixations at L4 and S1.  IMPRESSION: 1. Hypermetabolic right upper lobe nodule associated with hypermetabolic right hilar and infrahilar adenopathy, suspicion for hypermetabolic left infrahilar adenopathy, and a small  hypermetabolic nodule of the lateral limb right adrenal gland suspicious for a metastatic lesion. 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 2. 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   Electronically Signed   By: Sherryl Barters M.D.   On: 11/01/2013 15:41   The patient's PET scan was reviewed with radiology at the [facility] and also with interventional radiology. 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 Interventional radiology declined attempting a needle biopsy of the right adrenal gland because of its extremely small size and lack of any specific lesion.    Mm Diag Breast Tomo Bilateral  10/25/2013   CLINICAL DATA:  Patient notes tenderness fullness within the subareolar portion of the right breast.  EXAM:  DIGITAL DIAGNOSTIC BILATERAL MAMMOGRAM WITH TOMOSYNTHESIS AND CAD  DIGITAL BREAST TOMOSYNTHESIS  Digital breast tomosynthesis images are acquired in two projections. These images are reviewed in combination with the digital mammogram, confirming the findings below.  COMPARISON:  None  ACR Breast Density Category a: The breast tissue is almost entirely fatty.  FINDINGS: There is mild asymmetrical fibroglandular tissue within the subareolar portion the right breast most consistent with gynecomastia. There is no mass, distortion, or worrisome calcification within either breast.  Mammographic images were processed with CAD.  On my directed physical examination of the right breast there is mild soft fullness within the subareolar portion right breast consistent with gynecomastia.  IMPRESSION: Mild right-sided gynecomastia. No findings worrisome for malignancy.  RECOMMENDATION: Follow-up Breast  self-examination.  I have discussed the findings and recommendations with the patient. Results were also provided in writing at the conclusion of the visit. If applicable, a reminder letter will be sent to the patient regarding the next appointment.  BI-RADS CATEGORY  2: Benign.   Electronically Signed   By: Luberta Robertson M.D.   On: 10/25/2013 12:07   Ct Chest Wo/cm Screening  10/24/2013   CLINICAL DATA:  53 year old male with 35 pack-year history of smoking, currently smoking. Lung cancer screening examination.  EXAM: CT CHEST SCREENING WITHOUT CONTRAST  TECHNIQUE: Multidetector CT imaging of the chest was performed following the standard low-dose protocol without IV contrast.  COMPARISON:  No priors.  FINDINGS: Mediastinum: Heart size is normal. There is no significant pericardial fluid, thickening or pericardial calcification. There is atherosclerosis of the thoracic aorta, the great vessels of the mediastinum and the coronary arteries, including calcified atherosclerotic plaque in the left anterior descending coronary arteries. No pathologically enlarged mediastinal or hilar lymph nodes. Please note that accurate exclusion of hilar adenopathy is limited on noncontrast CT scans. Esophagus is unremarkable in appearance. 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  Lungs/Pleura: 19 x 14 mm (mean diameter of 16.5 mm) macrolobulated pleural based nodule in the posterior aspect of the right upper lobe (image 89 of series 3) is highly concerning for potential bronchogenic neoplasm. Several other scattered 2-4 mm pulmonary nodules are noted in the lungs bilaterally. No acute consolidative airspace disease. No pleural effusions. Moderate centrilobular and paraseptal emphysema, most pronounced throughout the lung apices bilaterally. Diffuse bronchial wall thickening. Extensive scarring and architectural distortion in the right middle lobe and  inferior segment of the lingula.  Upper Abdomen: Unremarkable.  Musculoskeletal: There are no aggressive appearing lytic or blastic lesions noted in the visualized portions of the skeleton.  IMPRESSION: 1. Lung-RADS Category 4B, suspicious for malignancy. Additional imaging evaluation with PET-CT and/or consultation with pulmonary medicine or thoracic surgery is strongly recommended at this time. 2. Diffuse bronchial wall thickening with moderate centrilobular and paraseptal emphysema, most pronounced throughout the lung apices bilaterally; imaging findings suggestive of underlying COPD. 3. Atherosclerosis, including left anterior descending coronary artery disease. Please note that although the presence of coronary artery calcium documents the presence of coronary artery disease, the severity of this disease and any potential stenosis cannot be assessed on this non-gated CT examination. Assessment for potential risk factor modification, dietary therapy or pharmacologic therapy may be warranted, if clinically indicated. 4. 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 These results were called by telephone at the time of interpretation on 10/24/2013 at 12:39 PM to  Dr. [name], who verbally acknowledged these results.   Electronically Signed   By: Vinnie Langton M.D.   On: 10/24/2013 12:41      Recent Lab Findings: Lab Results  Component Value Date   WBC 6.1 10/23/2013   HGB 15.6 10/23/2013   HCT 43.8 10/23/2013   PLT 239 10/23/2013   GLUCOSE 100* 10/23/2013   CHOL 198 10/23/2013   TRIG 76 10/23/2013   HDL 54 10/23/2013   LDLCALC 129* 10/23/2013   ALT 21 10/23/2013   AST 18 10/23/2013   NA 137 10/23/2013   K 4.9 10/23/2013   CL 105 10/23/2013   CREATININE 0.79 10/23/2013   BUN 10 10/23/2013   CO2 24 10/23/2013   FEV! 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 His clinical staging is contrary to the final report of the PET scan which indicates stage IV disease. After review of the PET scan at the multidisciplinary thoracic oncology conference , [place], and with interventional radiology findings of disease in the left infrahilar and right adrenal are very soft findings.     I've discussed the findings of the CT and PET scan with the patient. With review of the PET scan with radiology I will recommend to the patient that we proceed with lung resection including node dissection, likely requiring chemotherapy following resection. The patient plans on going to [place] next week we'll make arrangements to proceed with further treatment the week he returns.   I spent 55 minutes counseling the patient face to face. The total time spent in the appointment was 80 minutes.  Grace Isaac MD      [address] [place] [address] Office [phone]   Beeper [phone]  11/02/2013 5:26 PM

## 2013-11-03 ENCOUNTER — Ambulatory Visit
Admission: RE | Admit: 2013-11-03 | Discharge: 2013-11-03 | Disposition: A | Discharge status: Home / Self Care | Payer: 59 | Source: Ambulatory Visit | Attending: Cardiothoracic Surgery | Admitting: Cardiothoracic Surgery

## 2013-11-03 DIAGNOSIS — R911 Solitary pulmonary nodule: Secondary | ICD-10-CM

## 2013-11-03 MED ORDER — GADOBENATE DIMEGLUMINE 529 MG/ML IV SOLN
17.0000 mL | Freq: Once | INTRAVENOUS | Status: AC | PRN
  Administered 2013-11-03: 17 mL via INTRAVENOUS

## 2013-11-08 ENCOUNTER — Other Ambulatory Visit: Payer: Self-pay | Admitting: *Deleted

## 2013-11-20 ENCOUNTER — Encounter: Payer: Self-pay | Admitting: Cardiothoracic Surgery

## 2013-11-20 ENCOUNTER — Ambulatory Visit (INDEPENDENT_AMBULATORY_CARE_PROVIDER_SITE_OTHER): Payer: 59 | Admitting: Cardiothoracic Surgery

## 2013-11-20 VITALS — BP 157/93 | HR 82 | Resp 20 | Ht 69.5 in | Wt 188.0 lb

## 2013-11-20 DIAGNOSIS — R911 Solitary pulmonary nodule: Secondary | ICD-10-CM

## 2013-11-20 DIAGNOSIS — J984 Other disorders of lung: Secondary | ICD-10-CM

## 2013-11-20 NOTE — Progress Notes (Addendum)
[address]       [place] [address]             [phone]                       Eryn A Lindstrom [place] Medical Record #[id number] Date of Birth: 1960/11/25  Referring: Irven Shelling, MD Primary Care: Irven Shelling, MD  Chief Complaint:    Chief Complaint  Patient presents with  . Lung Lesion    F/U to discuss MRI Brain scan    History of Present Illness:    Jaime Morris 53 y.o. male is seen in the office  today for abnormal lung cancer screening CT. Patient has been long term smoker . He has been followed for COPD but is not significantly limited in his respiratory reserve. He is followed in the infectious disease clinic for HIV, with undetectable counts noted in November of 2014. Since first seen the patient has had a PET scan, pulmonary function studies and an MRI of the brain. On initial review and report of the PET scan interpretation was that of stage IV carcinoma of the lung clinically. Since the patient was first seen his radiographic findings have been reviewed in the [facility] conference , the findings concerning the right adrenal gland and left hilar nodes seem very equivocal on further review . MRI of the brain shows no evidence of metastatic disease  He denies any history of hemoptysis.   Current Activity/ Functional Status:  Patient is independent with mobility/ambulation, transfers, ADL's, IADL's.   Zubrod Score: At the time of surgery this patient's most appropriate activity status/level should be described as: [x]     0    Normal activity, no symptoms []     1    Restricted in physical strenuous activity but ambulatory, able to do out light work []     2    Ambulatory and capable of self care, unable to do work activities, up and about               >50 % of waking hours                              []     3    Only limited self care, in bed greater than 50% of waking hours []     4    Completely disabled, no self care,  confined to bed or chair []     5    Moribund   Past Medical History  Diagnosis Date  . HIV infection     DR. [name]  . Hyperlipidemia   . Insomnia   . Lung nodule, multiple 10/24/13    CT CHEST W/O...DR. [name]  . Gynecomastia, male 10/16/13    MILD RIGHT SIDED PER RADIOLOGY STUDY  . COPD (chronic obstructive pulmonary disease)     PFT'S 10/26/13 @ [place]    Past Surgical History  Procedure Laterality Date  . Foot surgery      BUNIONECTOMY  . Back surgery      L 4-5 FUSION WITH SCREW  . Colonoscopy  UTD    Family History  Problem Relation Age of Onset  . Stroke Mother   . Heart disease Father   . Sarcoidosis Sister     History   Social History  . Marital Status: Single    Spouse Name: N/A  Number of Children: N/A  . Years of Education: N/A   Occupational History  . Not on file.   Social History Main Topics  . Smoking status: Current Every Day Smoker -- 1.00 packs/day for 38 years    Types: Cigarettes  . Smokeless tobacco: Current User    Types: Chew  . Alcohol Use: No     Comment: in alcohol treatment/04/20/13  . Drug Use: No  . Sexual Activity: No     Comment: pt. declined condoms   Other Topics Concern  . Works from home , Therapist, art for [employer]           History  Smoking status  . Current Every Day Smoker -- 1.00 packs/day for 38 years  . Types: Cigarettes  Smokeless tobacco  . Current User  . Types: Chew    History  Alcohol Use No    Comment: in alcohol treatment/04/20/13     No Known Allergies  Current Outpatient Prescriptions  Medication Sig Dispense Refill  . albuterol (PROAIR HFA) 108 (90 BASE) MCG/ACT inhaler Inhale 2 puffs into the lungs every 4 (four) hours as needed.       Marland Kitchen efavirenz-emtricitabine-tenofovir (ATRIPLA) 600-200-300 MG per tablet Take 1 tablet by mouth daily.  90 tablet  3  . fluticasone (FLONASE) 50 MCG/ACT nasal spray Place 2 sprays into both nostrils daily.      . Melatonin 3 MG TABS  Take 10 mg by mouth at bedtime as needed.       . traZODone (DESYREL) 100 MG tablet Take 100 mg by mouth at bedtime.      . varenicline (CHANTIX PAK) 0.5 MG X 11 & 1 MG X 42 tablet Take 0.5 mg by mouth 2 (two) times daily. Take one 0.5mg  tablet by mouth once daily for 3 days, then increase to one 0.5mg  tablet twice daily for 3 days, then increase to one 1mg  tablet twice daily.        No current facility-administered medications for this visit.     Review of Systems:     Cardiac Review of Systems: Y or N  Chest Pain [ n   ]  Resting SOB [ n  ] Exertional SOB  Blue.Reese  ]  Orthopnea Florencio.Farrier  ]   Pedal Edema [ n  ]    Palpitations Florencio.Farrier  ] Syncope  [ n ]   Presyncope [  n ]  General Review of Systems: [Y] = yes [  ]=no Constitional: recent weight change [ n ];  Wt loss over the last 3 months [   ] anorexia [  ]; fatigue [  ]; nausea [  ]; night sweats [  ]; fever [  ]; or chills [  ];          Dental: poor dentition[ n ]; Last Dentist visit:   Eye : blurred vision [n  ]; diplopia [   ]; vision changes [  ];  Amaurosis fugax[  ]; Resp: cough Blue.Reese  ];  wheezing[ n ];  hemoptysis[n  ]; shortness of breath[y  ]; paroxysmal nocturnal dyspnea[  ]; dyspnea on exertion[ y ]; or orthopnea[  ];  GI:  gallstones[  ], vomiting[  ];  dysphagia[  ]; melena[  ];  hematochezia [  ]; heartburn[  ];   Hx of  Colonoscopy[  ]; GU: kidney stones [  ]; hematuria[  ];   dysuria [  ];  nocturia[ y ];  history of  obstruction [  ]; urinary frequency [ n ]             Skin: rash, swelling[  ];, hair loss[  ];  peripheral edema[  ];  or itching[  ]; Musculosketetal: myalgias[  ];  joint swelling[  ];  joint erythema[  ];  joint pain[  ];  back pain[  ];  Heme/Lymph: bruising[  ];  bleeding[  ];  anemia[  ];  Neuro: TIA[  ];  headaches[  ];  stroke[  ];  vertigo[  ];  seizures[ n ];   paresthesias[  ];  difficulty walking[n  ];  Psych:depression[  ]; anxiety[  ];  Endocrine: diabetes[n  ];  thyroid dysfunction[ n ];  Immunizations:  Flu up to date Blue.Reese  ]; Pneumococcal up to date [ y ];  Other:  Physical Exam: BP 157/93  Pulse 82  Resp 20  Ht 5' 9.5" (1.765 m)  Wt 188 lb (85.276 kg)  BMI 27.37 kg/m2  SpO2 97%  PHYSICAL EXAMINATION:  General appearance: alert, cooperative, appears stated age and no distress Neurologic: intact Heart: regular rate and rhythm, S1, S2 normal, no murmur, click, rub or gallop Lungs: clear to auscultation bilaterally Abdomen: soft, non-tender; bowel sounds normal; no masses,  no organomegaly Extremities: extremities normal, atraumatic, no cyanosis or edema and Homans sign is negative, no sign of DVT Patient has carotid bruits, no cervical or supraclavicular or axillary adenopathy  Diagnostic Studies & Laboratory data:     Recent Radiology Findings:   Nm Pet Image Initial (pi) Skull Base To Thigh  11/01/2013   CLINICAL DATA:  Initial treatment strategy for the lung nodule.  EXAM: NUCLEAR MEDICINE PET SKULL BASE TO THIGH  TECHNIQUE: 9.7 mCi F-18 FDG was injected intravenously. Full-ring PET imaging was performed from the skull base to thigh after the radiotracer. CT data was obtained and used for attenuation correction and anatomic localization.  FASTING BLOOD GLUCOSE:  Value: 112 mg/dl  COMPARISON:  10/24/2013  FINDINGS: NECK  No hypermetabolic lymph nodes in the neck. Chronic ethmoid, left sphenoid, and bilateral maxillary sinusitis. The  CHEST  The 1.4 cm right upper lobe pulmonary nodule has a maximum standard uptake value of 7.5. Conglomerate right hilar and infrahilar adenopathy maximum standard uptake value 10.1. Mildly hypermetabolic left infrahilar lymph node, maximum standard uptake value 3.2.  Severe emphysema noted. Faint nodularity anteriorly in the lingula is not hypermetabolic but the tiny nodules are below sensitive PET-CT size thresholds. Incidental partial anomalous pulmonary venous return, left upper lobe.  ABDOMEN/PELVIS  Hypermetabolic lateral limb of the right adrenal gland  with only equivocal thickening in this vicinity on the CT data. Maximum standard uptake value 5.0.  Enlarged prostate gland, 6.0 by 4.8 cm.  SKELETON  Negative for malignancy. Grade 2 anterolisthesis at L5-S1 with posterolateral rod and pedicle screw fixations at L4 and S1.  IMPRESSION: 1. Hypermetabolic right upper lobe nodule associated with hypermetabolic right hilar and infrahilar adenopathy, suspicion for hypermetabolic left infrahilar adenopathy, and a small hypermetabolic nodule of the lateral limb right adrenal gland suspicious for a metastatic lesion. 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 2. 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   Electronically Signed   By: Sherryl Barters M.D.   On: 11/01/2013 15:41  The patient's PET scan was reviewed with radiology at the [facility] conference and also with interventional radiology. 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 Interventional radiology declined attempting a needle biopsy of the right adrenal gland because of its extremely small size and lack of any specific lesion.    Mm Diag Breast Tomo Bilateral  10/25/2013   CLINICAL DATA:  Patient notes tenderness fullness within the subareolar portion of the right breast.  EXAM: DIGITAL DIAGNOSTIC BILATERAL MAMMOGRAM WITH TOMOSYNTHESIS AND CAD  DIGITAL BREAST TOMOSYNTHESIS  Digital breast tomosynthesis images are acquired in two projections. These images are reviewed in combination with the digital mammogram, confirming the findings below.  COMPARISON:  None  ACR Breast Density Category a: The breast tissue is almost entirely fatty.  FINDINGS: There is mild asymmetrical fibroglandular tissue within the  subareolar portion the right breast most consistent with gynecomastia. There is no mass, distortion, or worrisome calcification within either breast.  Mammographic images were processed with CAD.  On my directed physical examination of the right breast there is mild soft fullness within the subareolar portion right breast consistent with gynecomastia.  IMPRESSION: Mild right-sided gynecomastia. No findings worrisome for malignancy.  RECOMMENDATION: Follow-up Breast self-examination.  I have discussed the findings and recommendations with the patient. Results were also provided in writing at the conclusion of the visit. If applicable, a reminder letter will be sent to the patient regarding the next appointment.  BI-RADS CATEGORY  2: Benign.   Electronically Signed   By: Luberta Robertson M.D.   On: 10/25/2013 12:07   Ct Chest Wo/cm Screening  10/24/2013   CLINICAL DATA:  53 year old male with 35 pack-year history of smoking, currently smoking. Lung cancer screening examination.  EXAM: CT CHEST SCREENING WITHOUT CONTRAST  TECHNIQUE: Multidetector CT imaging of the chest was performed following the standard low-dose protocol without IV contrast.  COMPARISON:  No priors.  FINDINGS: Mediastinum: Heart size is normal. There is no significant pericardial fluid, thickening or pericardial calcification. There is atherosclerosis of the thoracic aorta, the great vessels of the mediastinum and the coronary arteries, including calcified atherosclerotic plaque in the left anterior descending coronary arteries. No pathologically enlarged mediastinal or hilar lymph nodes. Please note that accurate exclusion of hilar adenopathy is limited on noncontrast CT scans. Esophagus is unremarkable in appearance. 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  Lungs/Pleura: 19 x 14 mm (mean diameter of 16.5 mm) macrolobulated pleural based nodule in the posterior aspect of  the right upper lobe (image 89 of series 3) is highly concerning for potential bronchogenic neoplasm. Several other scattered 2-4 mm pulmonary nodules are noted in the lungs bilaterally. No acute consolidative airspace disease. No pleural effusions. Moderate centrilobular and paraseptal emphysema, most pronounced throughout the lung apices bilaterally. Diffuse bronchial wall thickening. Extensive scarring and architectural distortion in the right middle lobe and inferior segment of the lingula.  Upper Abdomen: Unremarkable.  Musculoskeletal: There are no aggressive appearing lytic or blastic lesions noted in the visualized portions of the skeleton.  IMPRESSION: 1. Lung-RADS Category 4B, suspicious for malignancy. Additional imaging evaluation with PET-CT and/or consultation with pulmonary medicine or thoracic surgery is strongly recommended at this time. 2. Diffuse bronchial wall thickening with moderate centrilobular and paraseptal emphysema, most pronounced throughout the lung apices bilaterally; imaging findings suggestive of underlying COPD. 3. Atherosclerosis, including left anterior descending coronary artery disease. Please note that although the presence  of coronary artery calcium documents the presence of coronary artery disease, the severity of this disease and any potential stenosis cannot be assessed on this non-gated CT examination. Assessment for potential risk factor modification, dietary therapy or pharmacologic therapy may be warranted, if clinically indicated. 4. 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 These results were called by telephone at the time of interpretation on 10/24/2013 at 12:39 PM to Dr. [name], who verbally acknowledged these results.   Electronically Signed   By: Vinnie Langton M.D.   On: 10/24/2013 12:41      Recent Lab Findings: Lab Results  Component Value Date   WBC  6.1 10/23/2013   HGB 15.6 10/23/2013   HCT 43.8 10/23/2013   PLT 239 10/23/2013   GLUCOSE 100* 10/23/2013   CHOL 198 10/23/2013   TRIG 76 10/23/2013   HDL 54 10/23/2013   LDLCALC 129* 10/23/2013   ALT 21 10/23/2013   AST 18 10/23/2013   NA 137 10/23/2013   K 4.9 10/23/2013   CL 105 10/23/2013   CREATININE 0.79 10/23/2013   BUN 10 10/23/2013   CO2 24 10/23/2013   FEV! 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 His clinical staging is contrary to the final report of the PET scan which indicates stage IV disease. After review of the PET scan at the multidisciplinary thoracic oncology conference , [place], and with interventional radiology findings of disease in the left infrahilar and right adrenal are very soft findings.  After review at the [facility] conference, the consensus is to proceed with preoperative chemotherapy after obtaining a tissue diagnosis and then proceed with surgical resection if no more definite evidence of stage IV disease develops. We'll make arrangements for CT guided needle biopsy of the right upper lobe lung lesion and an appointment to see Dr. Julien Nordmann, medical oncology. I discussed the radiographic findings and rationale for treatment decisions with the patient in detail and he is agreeable with proceeding in this manner.  I will see the patient back after he completes his reoperative chemotherapy.   Grace Isaac MD        [address] [place] [address] Office [phone]   Beeper [phone]  11/20/2013 5:34 PM

## 2013-11-21 ENCOUNTER — Other Ambulatory Visit: Payer: Self-pay | Admitting: *Deleted

## 2013-11-21 DIAGNOSIS — R911 Solitary pulmonary nodule: Secondary | ICD-10-CM

## 2013-11-23 ENCOUNTER — Encounter (HOSPITAL_COMMUNITY): Payer: Self-pay | Admitting: Pharmacy Technician

## 2013-11-27 ENCOUNTER — Other Ambulatory Visit: Payer: Self-pay | Admitting: Radiology

## 2013-11-28 ENCOUNTER — Inpatient Hospital Stay (HOSPITAL_COMMUNITY)
Admission: RE | Admit: 2013-11-28 | Discharge: 2013-11-30 | DRG: 167 | Disposition: A | Discharge status: Home / Self Care | Payer: 59 | Source: Ambulatory Visit | Attending: Interventional Radiology | Admitting: Interventional Radiology

## 2013-11-28 ENCOUNTER — Ambulatory Visit (HOSPITAL_COMMUNITY)
Admission: RE | Admit: 2013-11-28 | Discharge: 2013-11-28 | Disposition: A | Discharge status: Home / Self Care | Payer: 59 | Source: Ambulatory Visit | Attending: Interventional Radiology | Admitting: Interventional Radiology

## 2013-11-28 ENCOUNTER — Ambulatory Visit (HOSPITAL_COMMUNITY)
Admission: RE | Admit: 2013-11-28 | Discharge: 2013-11-28 | Disposition: A | Discharge status: Home / Self Care | Payer: 59 | Source: Ambulatory Visit | Attending: Cardiothoracic Surgery | Admitting: Cardiothoracic Surgery

## 2013-11-28 ENCOUNTER — Encounter (HOSPITAL_COMMUNITY): Payer: Self-pay

## 2013-11-28 ENCOUNTER — Other Ambulatory Visit: Payer: Self-pay | Admitting: Cardiothoracic Surgery

## 2013-11-28 VITALS — BP 138/89 | HR 97 | Temp 97.7°F | Resp 17 | Ht 70.0 in | Wt 185.3 lb

## 2013-11-28 DIAGNOSIS — L2089 Other atopic dermatitis: Secondary | ICD-10-CM

## 2013-11-28 DIAGNOSIS — J95811 Postprocedural pneumothorax: Principal | ICD-10-CM

## 2013-11-28 DIAGNOSIS — Z823 Family history of stroke: Secondary | ICD-10-CM

## 2013-11-28 DIAGNOSIS — G4700 Insomnia, unspecified: Secondary | ICD-10-CM

## 2013-11-28 DIAGNOSIS — N62 Hypertrophy of breast: Secondary | ICD-10-CM

## 2013-11-28 DIAGNOSIS — J439 Emphysema, unspecified: Secondary | ICD-10-CM

## 2013-11-28 DIAGNOSIS — Z9689 Presence of other specified functional implants: Secondary | ICD-10-CM

## 2013-11-28 DIAGNOSIS — R918 Other nonspecific abnormal finding of lung field: Secondary | ICD-10-CM

## 2013-11-28 DIAGNOSIS — B20 Human immunodeficiency virus [HIV] disease: Secondary | ICD-10-CM

## 2013-11-28 DIAGNOSIS — Y849 Medical procedure, unspecified as the cause of abnormal reaction of the patient, or of later complication, without mention of misadventure at the time of the procedure: Secondary | ICD-10-CM | POA: Diagnosis not present

## 2013-11-28 DIAGNOSIS — C3410 Malignant neoplasm of upper lobe, unspecified bronchus or lung: Secondary | ICD-10-CM | POA: Diagnosis present

## 2013-11-28 DIAGNOSIS — Z8249 Family history of ischemic heart disease and other diseases of the circulatory system: Secondary | ICD-10-CM

## 2013-11-28 DIAGNOSIS — B009 Herpesviral infection, unspecified: Secondary | ICD-10-CM

## 2013-11-28 DIAGNOSIS — J438 Other emphysema: Secondary | ICD-10-CM | POA: Diagnosis present

## 2013-11-28 DIAGNOSIS — F17200 Nicotine dependence, unspecified, uncomplicated: Secondary | ICD-10-CM

## 2013-11-28 DIAGNOSIS — Z21 Asymptomatic human immunodeficiency virus [HIV] infection status: Secondary | ICD-10-CM | POA: Diagnosis present

## 2013-11-28 DIAGNOSIS — F1021 Alcohol dependence, in remission: Secondary | ICD-10-CM

## 2013-11-28 DIAGNOSIS — J449 Chronic obstructive pulmonary disease, unspecified: Secondary | ICD-10-CM

## 2013-11-28 DIAGNOSIS — M4310 Spondylolisthesis, site unspecified: Secondary | ICD-10-CM

## 2013-11-28 DIAGNOSIS — F329 Major depressive disorder, single episode, unspecified: Secondary | ICD-10-CM

## 2013-11-28 DIAGNOSIS — R911 Solitary pulmonary nodule: Secondary | ICD-10-CM

## 2013-11-28 DIAGNOSIS — J4489 Other specified chronic obstructive pulmonary disease: Secondary | ICD-10-CM

## 2013-11-28 DIAGNOSIS — F3289 Other specified depressive episodes: Secondary | ICD-10-CM

## 2013-11-28 DIAGNOSIS — I10 Essential (primary) hypertension: Secondary | ICD-10-CM

## 2013-11-28 DIAGNOSIS — J939 Pneumothorax, unspecified: Secondary | ICD-10-CM

## 2013-11-28 DIAGNOSIS — E785 Hyperlipidemia, unspecified: Secondary | ICD-10-CM | POA: Diagnosis present

## 2013-11-28 LAB — CBC
HEMATOCRIT: 45.7 % (ref 39.0–52.0)
HEMOGLOBIN: 16 g/dL (ref 13.0–17.0)
MCH: 30.2 pg (ref 26.0–34.0)
MCHC: 35 g/dL (ref 30.0–36.0)
MCV: 86.2 fL (ref 78.0–100.0)
Platelets: 211 10*3/uL (ref 150–400)
RBC: 5.3 MIL/uL (ref 4.22–5.81)
RDW: 14.1 % (ref 11.5–15.5)
WBC: 8.4 10*3/uL (ref 4.0–10.5)

## 2013-11-28 LAB — APTT: APTT: 29 s (ref 24–37)

## 2013-11-28 LAB — PROTIME-INR
INR: 0.94 (ref 0.00–1.49)
Prothrombin Time: 12.4 seconds (ref 11.6–15.2)

## 2013-11-28 MED ORDER — MIDAZOLAM HCL 2 MG/2ML IJ SOLN
INTRAMUSCULAR | Status: AC | PRN
  Administered 2013-11-28: 1 mg via INTRAVENOUS
  Administered 2013-11-28: 0.5 mg via INTRAVENOUS
  Administered 2013-11-28 (×2): 1 mg via INTRAVENOUS
  Administered 2013-11-28: 0.5 mg via INTRAVENOUS
  Administered 2013-11-28: 1 mg via INTRAVENOUS

## 2013-11-28 MED ORDER — MELATONIN 10 MG PO TABS: 10.0000 mg | ORAL_TABLET | Freq: Every evening | ORAL | Status: DC | PRN

## 2013-11-28 MED ORDER — EFAVIRENZ-EMTRICITAB-TENOFOVIR 600-200-300 MG PO TABS
1.0000 | ORAL_TABLET | Freq: Every day | ORAL | Status: DC
  Administered 2013-11-28 – 2013-11-29 (×2): 1 via ORAL
  Filled 2013-11-28 (×5): qty 1

## 2013-11-28 MED ORDER — MIDAZOLAM HCL 2 MG/2ML IJ SOLN
INTRAMUSCULAR | Status: AC
  Filled 2013-11-28: qty 2

## 2013-11-28 MED ORDER — FENTANYL CITRATE 0.05 MG/ML IJ SOLN
INTRAMUSCULAR | Status: AC | PRN
  Administered 2013-11-28: 25 ug via INTRAVENOUS
  Administered 2013-11-28: 50 ug via INTRAVENOUS
  Administered 2013-11-28: 25 ug via INTRAVENOUS
  Administered 2013-11-28 (×2): 50 ug via INTRAVENOUS

## 2013-11-28 MED ORDER — HYDROCODONE-ACETAMINOPHEN 5-325 MG PO TABS
1.0000 | ORAL_TABLET | ORAL | Status: DC | PRN
  Filled 2013-11-28 (×3): qty 2

## 2013-11-28 MED ORDER — FLUTICASONE PROPIONATE 50 MCG/ACT NA SUSP
2.0000 | Freq: Every day | NASAL | Status: DC | PRN
  Filled 2013-11-28: qty 16

## 2013-11-28 MED ORDER — HYDROCODONE-ACETAMINOPHEN 5-325 MG PO TABS
1.0000 | ORAL_TABLET | ORAL | Status: DC | PRN
  Administered 2013-11-28 – 2013-11-30 (×7): 2 via ORAL
  Filled 2013-11-28 (×5): qty 2

## 2013-11-28 MED ORDER — NICOTINE 14 MG/24HR TD PT24
14.0000 mg | MEDICATED_PATCH | TRANSDERMAL | Status: DC
  Administered 2013-11-28 – 2013-11-29 (×2): 14 mg via TRANSDERMAL
  Filled 2013-11-28 (×4): qty 1

## 2013-11-28 MED ORDER — TRAZODONE HCL 100 MG PO TABS
100.0000 mg | ORAL_TABLET | Freq: Every day | ORAL | Status: DC
  Administered 2013-11-28 – 2013-11-29 (×2): 100 mg via ORAL
  Filled 2013-11-28: qty 1
  Filled 2013-11-28: qty 2
  Filled 2013-11-28 (×2): qty 1

## 2013-11-28 MED ORDER — ALBUTEROL SULFATE (2.5 MG/3ML) 0.083% IN NEBU: 2.5000 mg | INHALATION_SOLUTION | RESPIRATORY_TRACT | Status: DC | PRN

## 2013-11-28 MED ORDER — VARENICLINE TARTRATE 1 MG PO TABS
1.0000 mg | ORAL_TABLET | Freq: Two times a day (BID) | ORAL | Status: DC
  Administered 2013-11-28 – 2013-11-30 (×4): 1 mg via ORAL
  Filled 2013-11-28 (×6): qty 1

## 2013-11-28 MED ORDER — SODIUM CHLORIDE 0.9 % IV SOLN: 250.0000 mL | INTRAVENOUS | Status: DC | PRN

## 2013-11-28 MED ORDER — SODIUM CHLORIDE 0.9 % IV SOLN
Freq: Once | INTRAVENOUS | Status: AC
  Administered 2013-11-28: 08:00:00 via INTRAVENOUS

## 2013-11-28 MED ORDER — FENTANYL CITRATE 0.05 MG/ML IJ SOLN
INTRAMUSCULAR | Status: AC
  Filled 2013-11-28: qty 4

## 2013-11-28 MED ORDER — LIDOCAINE HCL 1 % IJ SOLN
INTRAMUSCULAR | Status: AC
  Filled 2013-11-28: qty 10

## 2013-11-28 MED ORDER — VARENICLINE TARTRATE 0.5 MG X 11 & 1 MG X 42 PO MISC: 1.0000 | Freq: Two times a day (BID) | ORAL | Status: DC

## 2013-11-28 MED ORDER — MIDAZOLAM HCL 2 MG/2ML IJ SOLN
INTRAMUSCULAR | Status: AC
  Filled 2013-11-28: qty 4

## 2013-11-28 NOTE — Sedation Documentation (Signed)
bandaid to back dry and intact.

## 2013-11-28 NOTE — Sedation Documentation (Signed)
Adhesive bandage dry and intact to rt mid upper back.

## 2013-11-28 NOTE — Sedation Documentation (Signed)
Patient is resting comfortably, speaking on phone with family.

## 2013-11-28 NOTE — Progress Notes (Signed)
PHARMACIST - PHYSICIAN ORDER COMMUNICATION  CONCERNING: P&T Medication Policy on Herbal Medications  DESCRIPTION:  This patient's order for: melatonin has been noted.  This product(s) is classified as an "herbal" or natural product. Due to a lack of definitive safety studies or FDA approval, nonstandard manufacturing practices, plus the potential risk of unknown drug-drug interactions while on inpatient medications, the Pharmacy and Therapeutics Committee does not permit the use of "herbal" or natural products of this type within [facility].   ACTION TAKEN: The pharmacy department is unable to verify this order at this time and your patient has been informed of this safety policy. Please reevaluate patient's clinical condition at discharge and address if the herbal or natural product(s) should be resumed at that time.  Jerrye Beavers, PharmD, BCPS Clinical Pharmacist

## 2013-11-28 NOTE — H&P (Signed)
Very high risk for PTX given severe surrounding emphysematous changes.  Pt understands the risks (including PTX, need for chest tube, hemoptysis, stroke and death), benefits and alternatives and agrees to proceed.  Signed,  Criselda Peaches, MD [facility] Radiology

## 2013-11-28 NOTE — Sedation Documentation (Signed)
Received call for bed assignment. 0I37.  Called 6N, was told Charge nurse would call back. Unable to given report at this time.

## 2013-11-28 NOTE — Sedation Documentation (Signed)
Chest tube placed to wall suction. Sats down to 88%, O2 increased to 4LNC.  Breathing remains unlabored.

## 2013-11-28 NOTE — Sedation Documentation (Signed)
Chest tube to suction

## 2013-11-28 NOTE — Sedation Documentation (Signed)
Called bed control and admitting for pt bed placement

## 2013-11-28 NOTE — Sedation Documentation (Signed)
Doctor to now insert chest tube after scan review of pt lying on back.

## 2013-11-28 NOTE — Procedures (Signed)
Interventional Radiology Procedure Note  Procedure:  1.) CT guided biopsy of RUL pulmonary nodule. 2.) Placement RIGHT chest tube Complications: Immediate enlarging post procedure PTX requiring chest tube placement.  Pt remained clinically asymptomatic although O2 sats decreased as expected.  Recommendations: - Keep chest tube on LWS at -20 cm H2O - ADAT - Will admit for observation.   Signed,  Criselda Peaches, MD [facility] Radiology

## 2013-11-28 NOTE — Progress Notes (Signed)
Pt requesting Nicotine patch. TCT Radiology dept. MD on call is in the middle of a case that may take a couple hours. Msg left w/ Radiology staff and requested callback. Pt notified. Blair Hailey, RN

## 2013-11-28 NOTE — Sedation Documentation (Signed)
Bandaid dry and intact to back.

## 2013-11-28 NOTE — H&P (Signed)
Jaime Morris is an 53 y.o. male.   Chief Complaint: Long time smoker Pt asked PMD for a "cancer screening" CT Chest w/o CM Ca screening was performed 10/24/13 + Rt lung mass; emphysema PET scan 11/01/13: +R lung mass; suspicious Rt adrenal mass and Rt hilar lymphadenopathy Now scheduled for Rt lung mass biopsy per Dr Delene Ruffini and Dr Roxy Horseman  HPI: +HIV; HLD; smoker; COPD  Past Medical History  Diagnosis Date  . HIV infection     DR. [name]  . Hyperlipidemia   . Insomnia   . Lung nodule, multiple 10/24/13    CT CHEST W/O...DR. [name]  . Gynecomastia, male 10/16/13    MILD RIGHT SIDED PER RADIOLOGY STUDY  . COPD (chronic obstructive pulmonary disease)     PFT'S 10/26/13 @ [place]    Past Surgical History  Procedure Laterality Date  . Foot surgery      BUNIONECTOMY  . Back surgery      L 4-5 FUSION WITH SCREW  . Colonoscopy  UTD    Family History  Problem Relation Age of Onset  . Stroke Mother   . Heart disease Father   . Sarcoidosis Sister    Social History:  reports that he has been smoking Cigarettes.  He has a 38 pack-year smoking history. His smokeless tobacco use includes Chew. He reports that he does not drink alcohol or use illicit drugs.  Allergies: No Known Allergies   (Not in a hospital admission)  Results for orders placed during the hospital encounter of 11/28/13 (from the past 48 hour(s))  APTT     Status: None   Collection Time    11/28/13  8:06 AM      Result Value Ref Range   aPTT 29  24 - 37 seconds  CBC     Status: None   Collection Time    11/28/13  8:06 AM      Result Value Ref Range   WBC 8.4  4.0 - 10.5 K/uL   RBC 5.30  4.22 - 5.81 MIL/uL   Hemoglobin 16.0  13.0 - 17.0 g/dL   HCT 45.7  39.0 - 52.0 %   MCV 86.2  78.0 - 100.0 fL   MCH 30.2  26.0 - 34.0 pg   MCHC 35.0  30.0 - 36.0 g/dL   RDW 14.1  11.5 - 15.5 %   Platelets 211  150 - 400 K/uL  PROTIME-INR     Status: None   Collection Time    11/28/13  8:06 AM      Result  Value Ref Range   Prothrombin Time 12.4  11.6 - 15.2 seconds   INR 0.94  0.00 - 1.49   No results found.  Review of Systems  Constitutional: Negative for fever and weight loss.  Respiratory: Positive for cough. Negative for shortness of breath.   Cardiovascular: Negative for chest pain.  Gastrointestinal: Negative for nausea, vomiting and abdominal pain.  Musculoskeletal: Negative for back pain.  Neurological: Negative for dizziness, weakness and headaches.  Psychiatric/Behavioral: Positive for substance abuse.       Smoker     Blood pressure 157/84, pulse 93, temperature 97.9 F (36.6 C), temperature source Oral, resp. rate 20, height 5\' 10"  (1.778 m), weight 84.057 kg (185 lb 5 oz), SpO2 96.00%. Physical Exam  Constitutional: He is oriented to person, place, and time. He appears well-nourished.  Cardiovascular: Normal rate, regular rhythm and normal heart sounds.   No murmur heard. Respiratory: Effort normal  and breath sounds normal. He has no wheezes.  GI: Soft. Bowel sounds are normal. There is no tenderness.  Musculoskeletal: Normal range of motion.  Neurological: He is alert and oriented to person, place, and time.  Skin: Skin is warm and dry.  Psychiatric: He has a normal mood and affect. His behavior is normal. Judgment and thought content normal.     Assessment/Plan Rt lung mass +PET Suspicious Rt adrenal mass and Rt hilar LAN +smoker; emphysema Scheduled for Rt Lung mass bx Pt aware of procedure benefits and risks and is agreeable to proceed Pt high risk for pneumothorax and he is aware. Consent signed and in chart  TURPIN,PAMELA A 11/28/2013, 8:52 AM

## 2013-11-28 NOTE — Sedation Documentation (Signed)
Bandaid applied to site. Pt turned from stomach to back.  Tolerated well

## 2013-11-28 NOTE — Sedation Documentation (Addendum)
Called for stat cxr to hall desk.  S/w Jaime Morris

## 2013-11-28 NOTE — Progress Notes (Signed)
Day of Surgery  Subjective: Post Rt lung mass biopsy pneumothorax Chest tube placed Admitted for overnight observation Pt resting; up in bed Complains of minimal pain Rt chest  Objective: Vital signs in last 24 hours: Temp:  [97.9 F (36.6 C)] 97.9 F (36.6 C) (06/16 0744) Pulse Rate:  [77-98] 80 (06/16 1200) Resp:  [11-20] 15 (06/16 1200) BP: (118-157)/(74-93) 138/88 mmHg (06/16 1200) SpO2:  [91 %-98 %] 98 % (06/16 1200) Weight:  [84.057 kg (185 lb 5 oz)] 84.057 kg (185 lb 5 oz) (06/16 0744)    Intake/Output from previous day:   Intake/Output this shift: Total I/O In: -  Out: 475 [Urine:475]  PE:  Afeb; vss 02 sat 98% 2L Chest tube in place Wall suction applied Site clean and dry No air leak  Lab Results:   Recent Labs  11/28/13 0806  WBC 8.4  HGB 16.0  HCT 45.7  PLT 211   BMET No results found for this basename: NA, K, CL, CO2, GLUCOSE, BUN, CREATININE, CALCIUM,  in the last 72 hours PT/INR  Recent Labs  11/28/13 0806  LABPROT 12.4  INR 0.94   ABG No results found for this basename: PHART, PCO2, PO2, HCO3,  in the last 72 hours  Studies/Results: Dg Chest Port 1 View  11/28/2013   CLINICAL DATA:  Right-sided chest tube placement for pneumothorax status post lung biopsy.  EXAM: PORTABLE CHEST - 1 VIEW  COMPARISON:  Images from CT-guided lung biopsy earlier today. Chest CT 10/24/2013. Chest radiographs 03/15/2013.  FINDINGS: The cardiomediastinal silhouette is within normal limits. A right-sided pigtail catheter overlies the right upper lung. No pneumothorax is identified. Emphysematous changes are noted in the lungs. Patchy opacities are present in both lung bases. Known right upper lobe lung nodule is not well seen. No large pleural effusion is identified. No acute osseous abnormality is identified.  IMPRESSION: 1. Right chest tube in place.  No pneumothorax identified. 2. Bibasilar lung opacities, likely atelectasis.   Electronically Signed   By: Logan Bores   On: 11/28/2013 12:06    Anti-infectives: Anti-infectives   Start     Dose/Rate Route Frequency Ordered Stop   11/28/13 1330  efavirenz-emtricitabine-tenofovir (ATRIPLA) 600-200-300 MG per tablet 1 tablet     1 tablet Oral Daily 11/28/13 1053        Assessment/Plan: s/p * No procedures listed *  Rt chest tube placement Rt PTX after lung mass bx Doing well Admitted to IR Dr [name] aware Check cxr in am   LOS: 0 days    TURPIN,PAMELA A 11/28/2013

## 2013-11-28 NOTE — Sedation Documentation (Signed)
Pt drinking gingerale, HOB up.

## 2013-11-29 ENCOUNTER — Observation Stay (HOSPITAL_COMMUNITY): Payer: 59

## 2013-11-29 DIAGNOSIS — J939 Pneumothorax, unspecified: Secondary | ICD-10-CM | POA: Diagnosis present

## 2013-11-29 MED ORDER — ZOLPIDEM TARTRATE 5 MG PO TABS
5.0000 mg | ORAL_TABLET | Freq: Every evening | ORAL | Status: DC | PRN
  Administered 2013-11-29: 5 mg via ORAL
  Filled 2013-11-29: qty 1

## 2013-11-29 NOTE — Progress Notes (Signed)
UR Completed Brenda Graves-Bigelow, RN,BSN [phone]  

## 2013-11-29 NOTE — Progress Notes (Signed)
1 Day Post-Op  Subjective: Rt lung biopsy 6/16 Post PTX--chest tube placed Has done well overnight Resting now Eating and slept well No complaints other than Rt chest soreness from tube  Objective: Vital signs in last 24 hours: Temp:  [97.5 F (36.4 C)-98.4 F (36.9 C)] 97.7 F (36.5 C) (06/17 0618) Pulse Rate:  [77-90] 86 (06/17 0618) Resp:  [13-16] 16 (06/17 0618) BP: (132-152)/(79-97) 144/97 mmHg (06/17 0618) SpO2:  [94 %-98 %] 98 % (06/17 0618)    Intake/Output from previous day: 06/16 0701 - 06/17 0700 In: -  Out: 1625 [Urine:1625] Intake/Output this shift: Total I/O In: 340 [P.O.:340] Out: 550 [Urine:550]  PE:  Afeb; vss CXR: No ptx this am Lungs: CTA Site of chest tube clean and dry No air leak   Lab Results:   Recent Labs  11/28/13 0806  WBC 8.4  HGB 16.0  HCT 45.7  PLT 211   BMET No results found for this basename: NA, K, CL, CO2, GLUCOSE, BUN, CREATININE, CALCIUM,  in the last 72 hours PT/INR  Recent Labs  11/28/13 0806  LABPROT 12.4  INR 0.94   ABG No results found for this basename: PHART, PCO2, PO2, HCO3,  in the last 72 hours  Studies/Results: Ct Biopsy  11/28/2013   CLINICAL DATA:  53 year old male with HIV and long history of smoking. A pulmonary nodule is identified on screening chest CT. Subsequent PET-CT demonstrates positive hypermetabolic activity concerning for primary bronchogenic carcinoma. CT-guided biopsy is warranted to facilitate tissue diagnosis and a treatment planning. Given the surrounding emphysema, the patient is very high risk for pneumothorax. He understands and wishes to proceed.  EXAM: CT BIOPSY; CT PERC PLEURAL DRAIN W/INDWELL CATH W/IMG GUIDE  Date: 11/28/2013  PROCEDURE: 1. CT-guided core biopsy of right upper lobe pulmonary nodule 2. Placement of a 10 French percutaneous thoracostomy tube Interventional Radiologist:  Criselda Peaches, MD  ANESTHESIA/SEDATION: Moderate (conscious) sedation was used. Five mg  Versed, 200 mcg Fentanyl were administered intravenously. The patient's vital signs were monitored continuously by radiology nursing throughout the procedure.  Sedation Time: 56 minutes  TECHNIQUE: Informed consent was obtained from the patient following explanation of the procedure, risks, benefits and alternatives. The patient understands, agrees and consents for the procedure. All questions were addressed. A time out was performed.  A planning axial CT scan was performed. The right upper lobe pulmonary nodule was identified. A suitable skin entry site was selected and marked. Local anesthesia was attained by infiltration with 1% lidocaine. Under intermittent CT fluoroscopic guidance, a 17 gauge trocar needle was carefully advanced through the right posterolateral chest wall and into the inferior aspect of the nodule. A total of two 18 gauge core biopsies were then coaxially obtained while directing the needle cephalad into the central mass of the nodule. Biopsy specimens were placed in formalin and sent to pathology for further analysis.  10 mL of peripheral blood was then aspirated from the patient's IV in used as a parenchymal blood patch while removing the outer trocar needle. A post biopsy axial CT scan demonstrates a small immediate postprocedural pneumothorax. Given the patient's relatively high risk of progressive pneumothorax he was repositioned on no is back. After a 10 min delay a repeat CT scan was performed. The pneumothorax was rapidly expanding. Therefore, the decision was made to proceed with percutaneous thoracostomy tube placement.  The CT images were used to select a skin entry site which was marked. The right anterolateral chest wall was then sterilely prepped  and draped in the usual fashion. Local anesthesia was attained by infiltration with 1% lidocaine. Using routine trocar technique, a Cook 10 [place] multipurpose drain was advanced into the pleural space and directed apically over the lung.  The locking loop was formed and the chest tube was connected to wall suction via a Pleur-Evac at -20 cm water. Followup axial CT imaging demonstrates near-total reinflation of the lung with only minimal residual basilar pneumothorax. The thoracostomy tube was secured to the skin with 0 Prolene suture and a sterile bandage was placed.  The patient tolerated the procedure very well.  COMPLICATIONS: Postprocedural pneumothorax requiring chest tube placement.  IMPRESSION: 1. Technically successful CT-guided lung biopsy of right upper lobe pulmonary nodule. 2. Placement of a 10 French percutaneous thoracostomy tube for treatment of immediate enlarging postprocedural pneumothorax. The patient remained hemodynamically stable and clinically asymptomatic throughout the procedure. He will be admitted for observation and management of the chest tube. Signed,  Criselda Peaches, MD  [facility]  [facility]   Electronically Signed   By: Jacqulynn Cadet M.D.   On: 11/28/2013 17:38   Portable Chest 1 View  11/29/2013   CLINICAL DATA:  Chest tube.  Evaluate pneumothorax.  EXAM: PORTABLE CHEST - 1 VIEW  COMPARISON:  11/28/2013  FINDINGS: No residual pneumothorax. Mild medial lung base opacity is stable likely atelectasis. Lungs are hyperexpanded but otherwise clear. Right-sided chest tube is unchanged.  Cardiopericardial silhouette is normal in size. Normal mediastinal and hilar contours.  IMPRESSION: Stable chest tube. No residual pneumothorax. Mild stable lung base atelectasis. No change from the previous exam allowing for differences in technique.   Electronically Signed   By: Lajean Manes M.D.   On: 11/29/2013 09:35   Dg Chest Port 1 View  11/28/2013   CLINICAL DATA:  Right-sided chest tube placement for pneumothorax status post lung biopsy.  EXAM: PORTABLE CHEST - 1 VIEW  COMPARISON:  Images from CT-guided lung biopsy earlier today. Chest CT 10/24/2013. Chest  radiographs 03/15/2013.  FINDINGS: The cardiomediastinal silhouette is within normal limits. A right-sided pigtail catheter overlies the right upper lung. No pneumothorax is identified. Emphysematous changes are noted in the lungs. Patchy opacities are present in both lung bases. Known right upper lobe lung nodule is not well seen. No large pleural effusion is identified. No acute osseous abnormality is identified.  IMPRESSION: 1. Right chest tube in place.  No pneumothorax identified. 2. Bibasilar lung opacities, likely atelectasis.   Electronically Signed   By: Logan Bores   On: 11/28/2013 12:06   Ct Perc Pleural Drain W/indwell Cath W/img Guide  11/28/2013   CLINICAL DATA:  53 year old male with HIV and long history of smoking. A pulmonary nodule is identified on screening chest CT. Subsequent PET-CT demonstrates positive hypermetabolic activity concerning for primary bronchogenic carcinoma. CT-guided biopsy is warranted to facilitate tissue diagnosis and a treatment planning. Given the surrounding emphysema, the patient is very high risk for pneumothorax. He understands and wishes to proceed.  EXAM: CT BIOPSY; CT PERC PLEURAL DRAIN W/INDWELL CATH W/IMG GUIDE  Date: 11/28/2013  PROCEDURE: 1. CT-guided core biopsy of right upper lobe pulmonary nodule 2. Placement of a 10 French percutaneous thoracostomy tube Interventional Radiologist:  Criselda Peaches, MD  ANESTHESIA/SEDATION: Moderate (conscious) sedation was used. Five mg Versed, 200 mcg Fentanyl were administered intravenously. The patient's vital signs were monitored continuously by radiology nursing throughout the procedure.  Sedation Time: 56 minutes  TECHNIQUE: Informed consent was obtained from the  patient following explanation of the procedure, risks, benefits and alternatives. The patient understands, agrees and consents for the procedure. All questions were addressed. A time out was performed.  A planning axial CT scan was performed. The right  upper lobe pulmonary nodule was identified. A suitable skin entry site was selected and marked. Local anesthesia was attained by infiltration with 1% lidocaine. Under intermittent CT fluoroscopic guidance, a 17 gauge trocar needle was carefully advanced through the right posterolateral chest wall and into the inferior aspect of the nodule. A total of two 18 gauge core biopsies were then coaxially obtained while directing the needle cephalad into the central mass of the nodule. Biopsy specimens were placed in formalin and sent to pathology for further analysis.  10 mL of peripheral blood was then aspirated from the patient's IV in used as a parenchymal blood patch while removing the outer trocar needle. A post biopsy axial CT scan demonstrates a small immediate postprocedural pneumothorax. Given the patient's relatively high risk of progressive pneumothorax he was repositioned on no is back. After a 10 min delay a repeat CT scan was performed. The pneumothorax was rapidly expanding. Therefore, the decision was made to proceed with percutaneous thoracostomy tube placement.  The CT images were used to select a skin entry site which was marked. The right anterolateral chest wall was then sterilely prepped and draped in the usual fashion. Local anesthesia was attained by infiltration with 1% lidocaine. Using routine trocar technique, a Cook 10 [place] multipurpose drain was advanced into the pleural space and directed apically over the lung. The locking loop was formed and the chest tube was connected to wall suction via a Pleur-Evac at -20 cm water. Followup axial CT imaging demonstrates near-total reinflation of the lung with only minimal residual basilar pneumothorax. The thoracostomy tube was secured to the skin with 0 Prolene suture and a sterile bandage was placed.  The patient tolerated the procedure very well.  COMPLICATIONS: Postprocedural pneumothorax requiring chest tube placement.  IMPRESSION: 1. Technically  successful CT-guided lung biopsy of right upper lobe pulmonary nodule. 2. Placement of a 10 French percutaneous thoracostomy tube for treatment of immediate enlarging postprocedural pneumothorax. The patient remained hemodynamically stable and clinically asymptomatic throughout the procedure. He will be admitted for observation and management of the chest tube. Signed,  Criselda Peaches, MD  [facility]  [facility]iology   Electronically Signed   By: Jacqulynn Cadet M.D.   On: 11/28/2013 17:38    Anti-infectives: Anti-infectives   Start     Dose/Rate Route Frequency Ordered Stop   11/28/13 1330  efavirenz-emtricitabine-tenofovir (ATRIPLA) 600-200-300 MG per tablet 1 tablet     1 tablet Oral Daily 11/28/13 1053        Assessment/Plan: s/p * No procedures listed *  Rt chest tube post Rt lung bx with ptx Admitted to IR CXR this am- No ptx Discussed with Dr Anselm Pancoast Need to keep on wall suction at least 24 hrs Will recheck CXR in am Consider water seal and dc 6/18 Pt aware and agreeable   LOS: 1 day    TURPIN,PAMELA A 11/29/2013

## 2013-11-30 ENCOUNTER — Inpatient Hospital Stay (HOSPITAL_COMMUNITY): Payer: 59

## 2013-11-30 NOTE — Progress Notes (Signed)
2 Days Post-Op  Subjective: Rt chest tube in place PTX post lung bx 6/16 Doing well CXR this am No PTX Up on bed now Chest tube clamped since 9 am today  Objective: Vital signs in last 24 hours: Temp:  [97.6 F (36.4 C)-98.4 F (36.9 C)] 97.9 F (36.6 C) (06/18 0457) Pulse Rate:  [75-84] 84 (06/18 0457) Resp:  [16-18] 17 (06/18 0457) BP: (144-162)/(84-87) 146/84 mmHg (06/18 0457) SpO2:  [98 %-100 %] 98 % (06/18 0457) Last BM Date: 11/29/13  Intake/Output from previous day: 06/17 0701 - 06/18 0700 In: 1240 [P.O.:1240] Out: 3750 [Urine:3750] Intake/Output this shift: Total I/O In: 360 [P.O.:360] Out: -   PE:  Afeb; vss Pleasant No complaints Chest tube intact No air leak CXR NO ptx this am   Lab Results:   Recent Labs  11/28/13 0806  WBC 8.4  HGB 16.0  HCT 45.7  PLT 211   BMET No results found for this basename: NA, K, CL, CO2, GLUCOSE, BUN, CREATININE, CALCIUM,  in the last 72 hours PT/INR  Recent Labs  11/28/13 0806  LABPROT 12.4  INR 0.94   ABG No results found for this basename: PHART, PCO2, PO2, HCO3,  in the last 72 hours  Studies/Results: Ct Biopsy  11/28/2013   CLINICAL DATA:  53 year old male with HIV and long history of smoking. A pulmonary nodule is identified on screening chest CT. Subsequent PET-CT demonstrates positive hypermetabolic activity concerning for primary bronchogenic carcinoma. CT-guided biopsy is warranted to facilitate tissue diagnosis and a treatment planning. Given the surrounding emphysema, the patient is very high risk for pneumothorax. He understands and wishes to proceed.  EXAM: CT BIOPSY; CT PERC PLEURAL DRAIN W/INDWELL CATH W/IMG GUIDE  Date: 11/28/2013  PROCEDURE: 1. CT-guided core biopsy of right upper lobe pulmonary nodule 2. Placement of a 10 French percutaneous thoracostomy tube Interventional Radiologist:  Criselda Peaches, MD  ANESTHESIA/SEDATION: Moderate (conscious) sedation was used. Five mg Versed, 200 mcg  Fentanyl were administered intravenously. The patient's vital signs were monitored continuously by radiology nursing throughout the procedure.  Sedation Time: 56 minutes  TECHNIQUE: Informed consent was obtained from the patient following explanation of the procedure, risks, benefits and alternatives. The patient understands, agrees and consents for the procedure. All questions were addressed. A time out was performed.  A planning axial CT scan was performed. The right upper lobe pulmonary nodule was identified. A suitable skin entry site was selected and marked. Local anesthesia was attained by infiltration with 1% lidocaine. Under intermittent CT fluoroscopic guidance, a 17 gauge trocar needle was carefully advanced through the right posterolateral chest wall and into the inferior aspect of the nodule. A total of two 18 gauge core biopsies were then coaxially obtained while directing the needle cephalad into the central mass of the nodule. Biopsy specimens were placed in formalin and sent to pathology for further analysis.  10 mL of peripheral blood was then aspirated from the patient's IV in used as a parenchymal blood patch while removing the outer trocar needle. A post biopsy axial CT scan demonstrates a small immediate postprocedural pneumothorax. Given the patient's relatively high risk of progressive pneumothorax he was repositioned on no is back. After a 10 min delay a repeat CT scan was performed. The pneumothorax was rapidly expanding. Therefore, the decision was made to proceed with percutaneous thoracostomy tube placement.  The CT images were used to select a skin entry site which was marked. The right anterolateral chest wall was then sterilely prepped  and draped in the usual fashion. Local anesthesia was attained by infiltration with 1% lidocaine. Using routine trocar technique, a Cook 10 [place] multipurpose drain was advanced into the pleural space and directed apically over the lung. The locking  loop was formed and the chest tube was connected to wall suction via a Pleur-Evac at -20 cm water. Followup axial CT imaging demonstrates near-total reinflation of the lung with only minimal residual basilar pneumothorax. The thoracostomy tube was secured to the skin with 0 Prolene suture and a sterile bandage was placed.  The patient tolerated the procedure very well.  COMPLICATIONS: Postprocedural pneumothorax requiring chest tube placement.  IMPRESSION: 1. Technically successful CT-guided lung biopsy of right upper lobe pulmonary nodule. 2. Placement of a 10 French percutaneous thoracostomy tube for treatment of immediate enlarging postprocedural pneumothorax. The patient remained hemodynamically stable and clinically asymptomatic throughout the procedure. He will be admitted for observation and management of the chest tube. Signed,  Criselda Peaches, MD  [facility]  [facility]   Electronically Signed   By: Jacqulynn Cadet M.D.   On: 11/28/2013 17:38   Dg Chest Port 1 View  11/30/2013   CLINICAL DATA:  Chest tube.  EXAM: PORTABLE CHEST - 1 VIEW  COMPARISON:  Chest x-ray 11/29/2013.  Chest CT 10/24/2013.  FINDINGS: Right chest tube in stable position. Mediastinum and hilar structures are unremarkable. Stable cardiomegaly. Normal pulmonary vascularity. Persistent pulmonary nodule right upper lung. Mild basilar atelectasis. No significant pneumothorax noted. No acute bony abnormality.  IMPRESSION: 1. Right chest tube in stable position. No evidence of pneumothorax. 2. Pulmonary nodule right upper lung unchanged. 3. Bibasilar atelectasis. 4. Stable cardiomegaly.   Electronically Signed   By: Marcello Moores  Register   On: 11/30/2013 08:11   Portable Chest 1 View  11/29/2013   CLINICAL DATA:  Chest tube.  Evaluate pneumothorax.  EXAM: PORTABLE CHEST - 1 VIEW  COMPARISON:  11/28/2013  FINDINGS: No residual pneumothorax. Mild medial lung base opacity is stable likely  atelectasis. Lungs are hyperexpanded but otherwise clear. Right-sided chest tube is unchanged.  Cardiopericardial silhouette is normal in size. Normal mediastinal and hilar contours.  IMPRESSION: Stable chest tube. No residual pneumothorax. Mild stable lung base atelectasis. No change from the previous exam allowing for differences in technique.   Electronically Signed   By: Lajean Manes M.D.   On: 11/29/2013 09:35   Dg Chest Port 1 View  11/28/2013   CLINICAL DATA:  Right-sided chest tube placement for pneumothorax status post lung biopsy.  EXAM: PORTABLE CHEST - 1 VIEW  COMPARISON:  Images from CT-guided lung biopsy earlier today. Chest CT 10/24/2013. Chest radiographs 03/15/2013.  FINDINGS: The cardiomediastinal silhouette is within normal limits. A right-sided pigtail catheter overlies the right upper lung. No pneumothorax is identified. Emphysematous changes are noted in the lungs. Patchy opacities are present in both lung bases. Known right upper lobe lung nodule is not well seen. No large pleural effusion is identified. No acute osseous abnormality is identified.  IMPRESSION: 1. Right chest tube in place.  No pneumothorax identified. 2. Bibasilar lung opacities, likely atelectasis.   Electronically Signed   By: Logan Bores   On: 11/28/2013 12:06   Ct Perc Pleural Drain W/indwell Cath W/img Guide  11/28/2013   CLINICAL DATA:  53 year old male with HIV and long history of smoking. A pulmonary nodule is identified on screening chest CT. Subsequent PET-CT demonstrates positive hypermetabolic activity concerning for primary bronchogenic carcinoma. CT-guided biopsy is warranted to  facilitate tissue diagnosis and a treatment planning. Given the surrounding emphysema, the patient is very high risk for pneumothorax. He understands and wishes to proceed.  EXAM: CT BIOPSY; CT PERC PLEURAL DRAIN W/INDWELL CATH W/IMG GUIDE  Date: 11/28/2013  PROCEDURE: 1. CT-guided core biopsy of right upper lobe pulmonary nodule  2. Placement of a 10 French percutaneous thoracostomy tube Interventional Radiologist:  Criselda Peaches, MD  ANESTHESIA/SEDATION: Moderate (conscious) sedation was used. Five mg Versed, 200 mcg Fentanyl were administered intravenously. The patient's vital signs were monitored continuously by radiology nursing throughout the procedure.  Sedation Time: 56 minutes  TECHNIQUE: Informed consent was obtained from the patient following explanation of the procedure, risks, benefits and alternatives. The patient understands, agrees and consents for the procedure. All questions were addressed. A time out was performed.  A planning axial CT scan was performed. The right upper lobe pulmonary nodule was identified. A suitable skin entry site was selected and marked. Local anesthesia was attained by infiltration with 1% lidocaine. Under intermittent CT fluoroscopic guidance, a 17 gauge trocar needle was carefully advanced through the right posterolateral chest wall and into the inferior aspect of the nodule. A total of two 18 gauge core biopsies were then coaxially obtained while directing the needle cephalad into the central mass of the nodule. Biopsy specimens were placed in formalin and sent to pathology for further analysis.  10 mL of peripheral blood was then aspirated from the patient's IV in used as a parenchymal blood patch while removing the outer trocar needle. A post biopsy axial CT scan demonstrates a small immediate postprocedural pneumothorax. Given the patient's relatively high risk of progressive pneumothorax he was repositioned on no is back. After a 10 min delay a repeat CT scan was performed. The pneumothorax was rapidly expanding. Therefore, the decision was made to proceed with percutaneous thoracostomy tube placement.  The CT images were used to select a skin entry site which was marked. The right anterolateral chest wall was then sterilely prepped and draped in the usual fashion. Local anesthesia was  attained by infiltration with 1% lidocaine. Using routine trocar technique, a Cook 10 [place] multipurpose drain was advanced into the pleural space and directed apically over the lung. The locking loop was formed and the chest tube was connected to wall suction via a Pleur-Evac at -20 cm water. Followup axial CT imaging demonstrates near-total reinflation of the lung with only minimal residual basilar pneumothorax. The thoracostomy tube was secured to the skin with 0 Prolene suture and a sterile bandage was placed.  The patient tolerated the procedure very well.  COMPLICATIONS: Postprocedural pneumothorax requiring chest tube placement.  IMPRESSION: 1. Technically successful CT-guided lung biopsy of right upper lobe pulmonary nodule. 2. Placement of a 10 French percutaneous thoracostomy tube for treatment of immediate enlarging postprocedural pneumothorax. The patient remained hemodynamically stable and clinically asymptomatic throughout the procedure. He will be admitted for observation and management of the chest tube. Signed,  Criselda Peaches, MD  [facility]  [facility]   Electronically Signed   By: Jacqulynn Cadet M.D.   On: 11/28/2013 17:38    Anti-infectives: Anti-infectives   Start     Dose/Rate Route Frequency Ordered Stop   11/28/13 1330  efavirenz-emtricitabine-tenofovir (ATRIPLA) 600-200-300 MG per tablet 1 tablet     1 tablet Oral Daily 11/28/13 1053        Assessment/Plan: s/p * No procedures listed * PTX post Rt lung bx 6/16 Chest tube placed after  proced Admitted 6/16. Doing well CXR  NO ptx this am Discussed with Dr McCullough--clamped tube at 9 am Repeat CXR at 12 noon If no ptx- will pull chest tube and plan for dc. Pt aware and agreable   LOS: 2 days    TURPIN,PAMELA A 11/30/2013

## 2013-11-30 NOTE — Progress Notes (Signed)
PAQ, Pam called and ordered to clamp chest tube. Stated she will be here shortly to assess pt.  CT clamped. Oren Bracket L \

## 2013-11-30 NOTE — Progress Notes (Signed)
Patient ID: Jaime Morris, male   DOB: 1961/02/24, 53 y.o.   MRN: [id number]    CXR post tube clamp : NO ptx  Pulled chest tube at bedside vaseline gauze dressing applied  Cxr in 1 hr per Dr Laurence Ferrari If no ptx--plan for dc today.

## 2013-11-30 NOTE — Discharge Summary (Signed)
Physician Discharge Summary  Patient ID: Jaime Morris MRN: [id number] DOB/AGE: [birth year] 55 y.o.  Admit date: 11/28/2013 Discharge date: 11/30/2013  Admission Diagnoses: Right lung mass  Discharge Diagnoses: Right pneumothorax after biopsy; resolved Active Problems:   Pneumothorax of right lung after biopsy   Pneumothorax   Discharged Condition: stable; improved  Hospital Course: Rt lung mass with Rt lung mass biopsy performed 6/16 in IR.  Post procedure Rt pneumothorax that required chest tube placement.  Pt has done well.  Admitted for 48 hrs. Clamped chest tube x 3 hrs this am.  Chest tube removal without complication at bedside. CXR 1 hour after removal still without ptx.   Discussed pt status with Dr Laurence Ferrari. He has reviewed all films and pts chart. Plan for dc to home now. Eating and drinking well. UOP good. +BM. Denies any complaints.  Consults: None  Significant Diagnostic Studies: Right lung biopsy with post pneumothorax  Treatments: chest tube placed x 48 hrs; removed and post chest xray is without ptx  Discharge Exam: Blood pressure 138/89, pulse 97, temperature 97.7 F (36.5 C), temperature source Oral, resp. rate 17, height 5\' 10"  (1.778 m), weight 84.057 kg (185 lb 5 oz), SpO2 99.00%.  PE:  Afeb; vss Lungs: CTA Dressing at chest tube removal site is clean and dry No bleeding Heart: RRR CXR 1 hr post removal is without ptx  Results for orders placed during the hospital encounter of 11/28/13  APTT      Result Value Ref Range   aPTT 29  24 - 37 seconds  CBC      Result Value Ref Range   WBC 8.4  4.0 - 10.5 K/uL   RBC 5.30  4.22 - 5.81 MIL/uL   Hemoglobin 16.0  13.0 - 17.0 g/dL   HCT 45.7  39.0 - 52.0 %   MCV 86.2  78.0 - 100.0 fL   MCH 30.2  26.0 - 34.0 pg   MCHC 35.0  30.0 - 36.0 g/dL   RDW 14.1  11.5 - 15.5 %   Platelets 211  150 - 400 K/uL  PROTIME-INR      Result Value Ref Range   Prothrombin Time 12.4  11.6 - 15.2 seconds   INR 0.94   0.00 - 1.49    Disposition: Rt lung mass Rt lung mass biopsy with post procedure ptx. No ptx 1 hr post removal. Dc now to home. Resume all home meds Pt has good understanding of dc instructions To follow with Dr [name].  Discharge Instructions   Call MD for:  difficulty breathing, headache or visual disturbances    Complete by:  As directed      Call MD for:  hives    Complete by:  As directed      Call MD for:  persistant dizziness or light-headedness    Complete by:  As directed      Call MD for:  persistant nausea and vomiting    Complete by:  As directed      Call MD for:  redness, tenderness, or signs of infection (pain, swelling, redness, odor or green/yellow discharge around incision site)    Complete by:  As directed      Call MD for:  severe uncontrolled pain    Complete by:  As directed      Call MD for:  temperature >100.4    Complete by:  As directed      Diet - low sodium heart healthy  Complete by:  As directed      Discharge instructions    Complete by:  As directed   Restful x 24 hrs; leave bandage on for 24 hrs; may shower tomorrow     Increase activity slowly    Complete by:  As directed      Lifting restrictions    Complete by:  As directed   No lifting over 10 lbs x 3-5 days     Remove dressing in 24 hours    Complete by:  As directed   May shower tomorrow; leave dressing on til after shower; replace with new band aid daily x 3-5 days            Medication List         efavirenz-emtricitabine-tenofovir 600-200-300 MG per tablet  Commonly known as:  ATRIPLA  Take 1 tablet by mouth daily.     fluticasone 50 MCG/ACT nasal spray  Commonly known as:  FLONASE  Place 2 sprays into both nostrils daily as needed for allergies.     Melatonin 10 MG Tabs  Take 10 mg by mouth at bedtime as needed (sleep).     PROAIR HFA 108 (90 BASE) MCG/ACT inhaler  Generic drug:  albuterol  Inhale 2 puffs into the lungs every 4 (four) hours as needed for  wheezing or shortness of breath.     traZODone 100 MG tablet  Commonly known as:  DESYREL  Take 100 mg by mouth at bedtime.     varenicline 0.5 MG X 11 & 1 MG X 42 tablet  Commonly known as:  CHANTIX PAK  Take 0.5 mg by mouth 2 (two) times daily. Take one 0.5mg  tablet by mouth once daily for 3 days, then increase to one 0.5mg  tablet twice daily for 3 days, then increase to one 1mg  tablet twice daily.         SignedMonia Sabal A 11/30/2013, 2:36 PM

## 2013-12-01 ENCOUNTER — Telehealth: Payer: Self-pay | Admitting: *Deleted

## 2013-12-01 NOTE — Telephone Encounter (Signed)
Called pt with appt for [facility] 12/07/13 at 1:30 labs/2:00 with Dr. Julien Nordmann.  He verbalized understanding of appt time and place.

## 2013-12-03 ENCOUNTER — Encounter: Payer: Self-pay | Admitting: Cardiothoracic Surgery

## 2013-12-06 ENCOUNTER — Other Ambulatory Visit: Payer: Self-pay | Admitting: *Deleted

## 2013-12-06 DIAGNOSIS — R918 Other nonspecific abnormal finding of lung field: Secondary | ICD-10-CM

## 2013-12-07 ENCOUNTER — Telehealth: Payer: Self-pay | Admitting: *Deleted

## 2013-12-07 ENCOUNTER — Other Ambulatory Visit: Payer: Self-pay | Admitting: *Deleted

## 2013-12-07 ENCOUNTER — Encounter: Payer: Self-pay | Admitting: *Deleted

## 2013-12-07 ENCOUNTER — Ambulatory Visit: Payer: 59 | Attending: Internal Medicine | Admitting: Physical Therapy

## 2013-12-07 ENCOUNTER — Other Ambulatory Visit (HOSPITAL_BASED_OUTPATIENT_CLINIC_OR_DEPARTMENT_OTHER): Payer: 59

## 2013-12-07 ENCOUNTER — Encounter: Payer: Self-pay | Admitting: Internal Medicine

## 2013-12-07 ENCOUNTER — Ambulatory Visit (HOSPITAL_BASED_OUTPATIENT_CLINIC_OR_DEPARTMENT_OTHER): Payer: 59 | Admitting: Internal Medicine

## 2013-12-07 ENCOUNTER — Telehealth: Payer: Self-pay | Admitting: Internal Medicine

## 2013-12-07 ENCOUNTER — Other Ambulatory Visit: Payer: Self-pay

## 2013-12-07 VITALS — BP 154/87 | HR 85 | Temp 98.2°F | Resp 18 | Ht 70.0 in | Wt 188.9 lb

## 2013-12-07 DIAGNOSIS — J4489 Other specified chronic obstructive pulmonary disease: Secondary | ICD-10-CM | POA: Insufficient documentation

## 2013-12-07 DIAGNOSIS — C3490 Malignant neoplasm of unspecified part of unspecified bronchus or lung: Secondary | ICD-10-CM

## 2013-12-07 DIAGNOSIS — E279 Disorder of adrenal gland, unspecified: Secondary | ICD-10-CM

## 2013-12-07 DIAGNOSIS — J449 Chronic obstructive pulmonary disease, unspecified: Secondary | ICD-10-CM | POA: Insufficient documentation

## 2013-12-07 DIAGNOSIS — C3491 Malignant neoplasm of unspecified part of right bronchus or lung: Secondary | ICD-10-CM

## 2013-12-07 DIAGNOSIS — B20 Human immunodeficiency virus [HIV] disease: Secondary | ICD-10-CM | POA: Diagnosis not present

## 2013-12-07 DIAGNOSIS — F17200 Nicotine dependence, unspecified, uncomplicated: Secondary | ICD-10-CM | POA: Insufficient documentation

## 2013-12-07 DIAGNOSIS — R0609 Other forms of dyspnea: Secondary | ICD-10-CM

## 2013-12-07 DIAGNOSIS — M7989 Other specified soft tissue disorders: Secondary | ICD-10-CM | POA: Diagnosis not present

## 2013-12-07 DIAGNOSIS — R918 Other nonspecific abnormal finding of lung field: Secondary | ICD-10-CM

## 2013-12-07 DIAGNOSIS — C3410 Malignant neoplasm of upper lobe, unspecified bronchus or lung: Secondary | ICD-10-CM

## 2013-12-07 DIAGNOSIS — R0989 Other specified symptoms and signs involving the circulatory and respiratory systems: Secondary | ICD-10-CM

## 2013-12-07 DIAGNOSIS — R05 Cough: Secondary | ICD-10-CM

## 2013-12-07 DIAGNOSIS — IMO0001 Reserved for inherently not codable concepts without codable children: Secondary | ICD-10-CM | POA: Insufficient documentation

## 2013-12-07 DIAGNOSIS — Z21 Asymptomatic human immunodeficiency virus [HIV] infection status: Secondary | ICD-10-CM

## 2013-12-07 DIAGNOSIS — R059 Cough, unspecified: Secondary | ICD-10-CM

## 2013-12-07 LAB — COMPREHENSIVE METABOLIC PANEL (CC13)
ALBUMIN: 3.9 g/dL (ref 3.5–5.0)
ALT: 27 U/L (ref 0–55)
ANION GAP: 10 meq/L (ref 3–11)
AST: 24 U/L (ref 5–34)
Alkaline Phosphatase: 111 U/L (ref 40–150)
BUN: 10.8 mg/dL (ref 7.0–26.0)
CO2: 23 mEq/L (ref 22–29)
CREATININE: 0.9 mg/dL (ref 0.7–1.3)
Calcium: 9.6 mg/dL (ref 8.4–10.4)
Chloride: 104 mEq/L (ref 98–109)
Glucose: 113 mg/dl (ref 70–140)
Potassium: 4.5 mEq/L (ref 3.5–5.1)
Sodium: 137 mEq/L (ref 136–145)
Total Bilirubin: 0.49 mg/dL (ref 0.20–1.20)
Total Protein: 7.8 g/dL (ref 6.4–8.3)

## 2013-12-07 LAB — CBC WITH DIFFERENTIAL/PLATELET
BASO%: 0.3 % (ref 0.0–2.0)
BASOS ABS: 0 10*3/uL (ref 0.0–0.1)
EOS%: 1.4 % (ref 0.0–7.0)
Eosinophils Absolute: 0.1 10*3/uL (ref 0.0–0.5)
HEMATOCRIT: 44.9 % (ref 38.4–49.9)
HGB: 15.5 g/dL (ref 13.0–17.1)
LYMPH#: 2.1 10*3/uL (ref 0.9–3.3)
LYMPH%: 28.8 % (ref 14.0–49.0)
MCH: 29.8 pg (ref 27.2–33.4)
MCHC: 34.5 g/dL (ref 32.0–36.0)
MCV: 86.2 fL (ref 79.3–98.0)
MONO#: 0.6 10*3/uL (ref 0.1–0.9)
MONO%: 7.7 % (ref 0.0–14.0)
NEUT%: 61.8 % (ref 39.0–75.0)
NEUTROS ABS: 4.5 10*3/uL (ref 1.5–6.5)
Platelets: 205 10*3/uL (ref 140–400)
RBC: 5.21 10*6/uL (ref 4.20–5.82)
RDW: 13.7 % (ref 11.0–14.6)
WBC: 7.3 10*3/uL (ref 4.0–10.3)

## 2013-12-07 NOTE — Telephone Encounter (Signed)
, °

## 2013-12-07 NOTE — Progress Notes (Signed)
Put fmla form on nurse's desk °

## 2013-12-07 NOTE — Progress Notes (Signed)
[place] Clinical Social Work  Clinical Social Work met with patient/family and Futures trader at [facility] appointment to offer support and assess for psychosocial needs.  Medical oncologist reviewed patient's diagnosis and recommended treatment plan with patient/family.  Patient was accompanied by his friend today.  Patient was agreeable to treatment plan.  His main concern at this time is work- he has requested FMLA paperwork be completed.  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  Patient received advance directive paperwork and was encouraged to contact CSW to complete at a later time.    Clinical Social Work briefly discussed Clinical Social Work role and [employer] support programs/services.  Clinical Social Work encouraged patient to call with any additional questions or concerns.   [name], MSW, LCSW, OSW-C Clinical Social Worker [facility] [phone]

## 2013-12-07 NOTE — Progress Notes (Signed)
I was unable to reach patient by phone.  I left  A message on voice mail.  I instructed the patient to arrive at [place] entrance, check in in admitting office at 0600, nothing to eat or drink after midnight.   I instructed the patient to take the following medications in the am with just enough water to get them down:efavirenz-emtricitabine-tenofovir ; use inhaler if needed and bring it with him. I asked patient to not wear any lotions, powders, cologne, jewelry, piercing, make-up or nail polish.  I asked the patient to call [phone], in the am if there were any questions or problems.

## 2013-12-07 NOTE — Patient Instructions (Signed)
Smoking Cessation, Tips for Success If you are ready to quit smoking, congratulations! You have chosen to help yourself be healthier. Cigarettes bring nicotine, tar, carbon monoxide, and other irritants into your body. Your lungs, heart, and blood vessels will be able to work better without these poisons. There are many different ways to quit smoking. Nicotine gum, nicotine patches, a nicotine inhaler, or nicotine nasal spray can help with physical craving. Hypnosis, support groups, and medicines help break the habit of smoking. WHAT THINGS CAN I DO TO MAKE QUITTING EASIER?  Here are some tips to help you quit for good:  Pick a date when you will quit smoking completely. Tell all of your friends and family about your plan to quit on that date.  Do not try to slowly cut down on the number of cigarettes you are smoking. Pick a quit date and quit smoking completely starting on that day.  Throw away all cigarettes.   Clean and remove all ashtrays from your home, work, and car.   On a card, write down your reasons for quitting. Carry the card with you and read it when you get the urge to smoke.   Cleanse your body of nicotine. Drink enough water and fluids to keep your urine clear or pale yellow. Do this after quitting to flush the nicotine from your body.   Learn to predict your moods. Do not let a bad situation be your excuse to have a cigarette. Some situations in your life might tempt you into wanting a cigarette.   Never have "just one" cigarette. It leads to wanting another and another. Remind yourself of your decision to quit.   Change habits associated with smoking. If you smoked while driving or when feeling stressed, try other activities to replace smoking. Stand up when drinking your coffee. Brush your teeth after eating. Sit in a different chair when you read the paper. Avoid alcohol while trying to quit, and try to drink fewer caffeinated beverages. Alcohol and caffeine may urge  you to smoke.   Avoid foods and drinks that can trigger a desire to smoke, such as sugary or spicy foods and alcohol.   Ask people who smoke not to smoke around you.   Have something planned to do right after eating or having a cup of coffee. For example, plan to take a walk or exercise.   Try a relaxation exercise to calm you down and decrease your stress. Remember, you may be tense and nervous for the first 2 weeks after you quit, but this will pass.   Find new activities to keep your hands busy. Play with a pen, coin, or rubber band. Doodle or draw things on paper.   Brush your teeth right after eating. This will help cut down on the craving for the taste of tobacco after meals. You can also try mouthwash.   Use oral substitutes in place of cigarettes. Try using lemon drops, carrots, cinnamon sticks, or chewing gum. Keep them handy so they are available when you have the urge to smoke.   When you have the urge to smoke, try deep breathing.   Designate your home as a nonsmoking area.   If you are a heavy smoker, ask your health care Jaime Morris about a prescription for nicotine chewing gum. It can ease your withdrawal from nicotine.   Reward yourself. Set aside the cigarette money you save and buy yourself something nice.   Look for support from others. Join a support group or   smoking cessation program. Ask someone at home or at work to help you with your plan to quit smoking.   Always ask yourself, "Do I need this cigarette or is this just a reflex?" Tell yourself, "Today, I choose not to smoke," or "I do not want to smoke." You are reminding yourself of your decision to quit.  Do not replace cigarette smoking with electronic cigarettes (commonly called e-cigarettes). The safety of e-cigarettes is unknown, and some may contain harmful chemicals.  If you relapse, do not give up! Plan ahead and think about what you will do the next time you get the urge to smoke.  HOW WILL  I FEEL WHEN I QUIT SMOKING? You may have symptoms of withdrawal because your body is used to nicotine (the addictive substance in cigarettes). You may crave cigarettes, be irritable, feel very hungry, cough often, get headaches, or have difficulty concentrating. The withdrawal symptoms are only temporary. They are strongest when you first quit but will go away within 10-14 days. When withdrawal symptoms occur, stay in control. Think about your reasons for quitting. Remind yourself that these are signs that your body is healing and getting used to being without cigarettes. Remember that withdrawal symptoms are easier to treat than the major diseases that smoking can cause.  Even after the withdrawal is over, expect periodic urges to smoke. However, these cravings are generally short lived and will go away whether you smoke or not. Do not smoke!  WHAT RESOURCES ARE AVAILABLE TO HELP ME QUIT SMOKING? Your health care Jaime Morris can direct you to community resources or hospitals for support, which may include:  Group support.  Education.  Hypnosis.  Therapy. Document Released: 02/28/2004 Document Revised: 03/22/2013 Document Reviewed: 11/17/2012 [facility] Patient Information 2015 [place], [place]. This information is not intended to replace advice given to you by your health care Jaime Morris. Make sure you discuss any questions you have with your health care Jaime Morris.

## 2013-12-07 NOTE — Progress Notes (Signed)
Jaime Morris FMLA papers

## 2013-12-07 NOTE — Progress Notes (Signed)
[place] Telephone:([phone]) [phone]   [phone] Multidisciplinary thoracic oncology clinic ([place])  CONSULT NOTE  REFERRING PHYSICIAN: Dr. [name]  REASON FOR CONSULTATION:  53 years old African American male recently diagnosed with lung cancer  HPI Jaime Morris is a 53 y.o. male was past medical history significant for HIV diagnosed in 1996, depression, hypertension, COPD, history of gynecomastia as well as long history of smoking. The patient was seen by his primary care physician Dr. [name] for routine annual examination and he requested chest x-ray because of his long history of smoking. Dr. [name] ordered screening CT scan of the chest which was performed on 10/24/2013 and it showed 1.9 x 1.4 CM macrolobulated pleural based nodule in the posterior aspect of the right upper lobe highly suspicious for potential bronchogenic neoplasm. There were several other scattered-4 mm pulmonary nodules noted in the lungs bilaterally. He underwent a mammogram on 10/25/2013 and it showed mild right-sided gynecomastia with no findings worrisome for malignancy. The patient was referred to Dr. [name] and on 11/01/2013 he had a PET scan performed and it showed hypermetabolic right upper lobe nodule associated with hypermetabolic right hilar and infrahilar adenopathy, suspicious for hypermetabolic left infrahilar adenopathy and a small hypermetabolic nodule or the lateral limb the right adrenal gland suspicious for metastatic lesion. MRI of the brain on 11/03/2013 was negative for metastatic disease. On 11/28/2013 the patient underwent CT-guided core biopsy of the right upper lobe pulmonary nodule. This was complicated by a pneumothorax and he had placement of percutaneous thoracostomy tube by interventional radiology.  The final pathology of the right upper lobe biopsy (Accession: [id number]) showed small cell carcinoma. The malignant cells are positive for CD56 with  faint staining for synaptophysin. There is focal staining for p63. Cytokeratin 5/6 and chromogranin stains are negative. 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  Dr. [name] kindly referred the patient to me today for further evaluation and recommendation regarding treatment of his condition. When seen today the patient is feeling fine except for hoarseness of his voice started earlier today as well as persistent shortness of breath especially with exertion. He also has dry cough but no chest pain. He lost around 20 pounds in the last 12 months. He has some blurry vision but no headache or double vision.  Family history significant for a mother with stroke and father with heart disease. He also has a sister with sarcoidosis and paternal grandfather with melanoma. The patient is single and has no children. He was accompanied today by his friend Marine scientist. He works at [employer]. He has a history of smoking one pack per day for around 42 years and unfortunately continues to smoke. I strongly advise him to quit smoking and offered him to smoke cessation program. The patient and is a recovering alcoholic and he has no history of drug abuse. HPI  Past Medical History  Diagnosis Date  . HIV infection     DR. [name]  . Hyperlipidemia   . Insomnia   . Lung nodule, multiple 10/24/13    CT CHEST W/O...DR. [name]  . Gynecomastia, male 10/16/13    MILD RIGHT SIDED PER RADIOLOGY STUDY  . COPD (chronic obstructive pulmonary disease)     PFT'S 10/26/13 @ [facility]    Past Surgical History  Procedure Laterality Date  . Foot surgery      BUNIONECTOMY  . Back surgery      L 4-5 FUSION  WITH SCREW  . Colonoscopy  UTD    Family History  Problem Relation Age of Onset  . Stroke Mother   . Heart disease Father   . Sarcoidosis Sister     Social History History  Substance Use Topics  . Smoking status: Current Every Day Smoker -- 1.00 packs/day for 38 years      Types: Cigarettes  . Smokeless tobacco: Current User    Types: Chew  . Alcohol Use: No     Comment: in alcohol treatment/04/20/13    No Known Allergies  Current Outpatient Prescriptions  Medication Sig Dispense Refill  . albuterol (PROAIR HFA) 108 (90 BASE) MCG/ACT inhaler Inhale 2 puffs into the lungs every 4 (four) hours as needed for wheezing or shortness of breath.       . doxylamine, Sleep, (UNISOM) 25 MG tablet Take 25 mg by mouth at bedtime as needed.      Marland Kitchen efavirenz-emtricitabine-tenofovir (ATRIPLA) 600-200-300 MG per tablet Take 1 tablet by mouth daily.  90 tablet  3  . fluticasone (FLONASE) 50 MCG/ACT nasal spray Place 2 sprays into both nostrils daily as needed for allergies.       . Melatonin 10 MG TABS Take 10 mg by mouth at bedtime as needed (sleep).      . traZODone (DESYREL) 100 MG tablet Take 100 mg by mouth at bedtime.      . varenicline (CHANTIX PAK) 0.5 MG X 11 & 1 MG X 42 tablet Take 0.5 mg by mouth 2 (two) times daily. Take one 0.5mg  tablet by mouth once daily for 3 days, then increase to one 0.5mg  tablet twice daily for 3 days, then increase to one 1mg  tablet twice daily.        No current facility-administered medications for this visit.    Review of Systems  Constitutional: negative Eyes: negative Ears, nose, mouth, throat, and face: positive for hoarseness Respiratory: positive for cough and dyspnea on exertion Cardiovascular: negative Gastrointestinal: negative Genitourinary:negative Integument/breast: negative Hematologic/lymphatic: negative Musculoskeletal:negative Neurological: negative Behavioral/Psych: negative Endocrine: negative Allergic/Immunologic: negative  Physical Exam  EVO:JJKKX, healthy, no distress, well nourished, well developed and anxious SKIN: skin color, texture, turgor are normal, no rashes or significant lesions HEAD: Normocephalic, No masses, lesions, tenderness or abnormalities EYES: normal, PERRLA EARS: External ears  normal, Canals clear OROPHARYNX:no exudate, no erythema and lips, buccal mucosa, and tongue normal  NECK: supple, no adenopathy, no JVD LYMPH:  no palpable lymphadenopathy, no hepatosplenomegaly LUNGS: clear to auscultation , and palpation HEART: regular rate & rhythm, no murmurs and no gallops ABDOMEN:abdomen soft, non-tender, normal bowel sounds and no masses or organomegaly BACK: Back symmetric, no curvature., No CVA tenderness EXTREMITIES:no joint deformities, effusion, or inflammation, no edema, no skin discoloration, no clubbing  NEURO: alert & oriented x 3 with fluent speech, no focal motor/sensory deficits  PERFORMANCE STATUS: ECOG 1  LABORATORY DATA: Lab Results  Component Value Date   WBC 7.3 12/07/2013   HGB 15.5 12/07/2013   HCT 44.9 12/07/2013   MCV 86.2 12/07/2013   PLT 205 12/07/2013      Chemistry      Component Value Date/Time   NA 137 12/07/2013 1326   NA 137 10/23/2013 1112   K 4.5 12/07/2013 1326   K 4.9 10/23/2013 1112   CL 105 10/23/2013 1112   CO2 23 12/07/2013 1326   CO2 24 10/23/2013 1112   BUN 10.8 12/07/2013 1326   BUN 10 10/23/2013 1112   CREATININE 0.9 12/07/2013 1326  CREATININE 0.79 10/23/2013 1112   CREATININE 0.84 07/21/2010 2105      Component Value Date/Time   CALCIUM 9.6 12/07/2013 1326   CALCIUM 9.5 10/23/2013 1112   ALKPHOS 111 12/07/2013 1326   ALKPHOS 90 10/23/2013 1112   AST 24 12/07/2013 1326   AST 18 10/23/2013 1112   ALT 27 12/07/2013 1326   ALT 21 10/23/2013 1112   BILITOT 0.49 12/07/2013 1326   BILITOT 0.4 10/23/2013 1112       RADIOGRAPHIC STUDIES: Ct Biopsy  11/28/2013   CLINICAL DATA:  53 year old male with HIV and long history of smoking. A pulmonary nodule is identified on screening chest CT. Subsequent PET-CT demonstrates positive hypermetabolic activity concerning for primary bronchogenic carcinoma. CT-guided biopsy is warranted to facilitate tissue diagnosis and a treatment planning. Given the surrounding emphysema, the patient is  very high risk for pneumothorax. He understands and wishes to proceed.  EXAM: CT BIOPSY; CT PERC PLEURAL DRAIN W/INDWELL CATH W/IMG GUIDE  Date: 11/28/2013  PROCEDURE: 1. CT-guided core biopsy of right upper lobe pulmonary nodule 2. Placement of a 10 French percutaneous thoracostomy tube Interventional Radiologist:  Criselda Peaches, MD  ANESTHESIA/SEDATION: Moderate (conscious) sedation was used. Five mg Versed, 200 mcg Fentanyl were administered intravenously. The patient's vital signs were monitored continuously by radiology nursing throughout the procedure.  Sedation Time: 56 minutes  TECHNIQUE: Informed consent was obtained from the patient following explanation of the procedure, risks, benefits and alternatives. The patient understands, agrees and consents for the procedure. All questions were addressed. A time out was performed.  A planning axial CT scan was performed. The right upper lobe pulmonary nodule was identified. A suitable skin entry site was selected and marked. Local anesthesia was attained by infiltration with 1% lidocaine. Under intermittent CT fluoroscopic guidance, a 17 gauge trocar needle was carefully advanced through the right posterolateral chest wall and into the inferior aspect of the nodule. A total of two 18 gauge core biopsies were then coaxially obtained while directing the needle cephalad into the central mass of the nodule. Biopsy specimens were placed in formalin and sent to pathology for further analysis.  10 mL of peripheral blood was then aspirated from the patient's IV in used as a parenchymal blood patch while removing the outer trocar needle. A post biopsy axial CT scan demonstrates a small immediate postprocedural pneumothorax. Given the patient's relatively high risk of progressive pneumothorax he was repositioned on no is back. After a 10 min delay a repeat CT scan was performed. The pneumothorax was rapidly expanding. Therefore, the decision was made to proceed with  percutaneous thoracostomy tube placement.  The CT images were used to select a skin entry site which was marked. The right anterolateral chest wall was then sterilely prepped and draped in the usual fashion. Local anesthesia was attained by infiltration with 1% lidocaine. Using routine trocar technique, a Cook 10 [place] multipurpose drain was advanced into the pleural space and directed apically over the lung. The locking loop was formed and the chest tube was connected to wall suction via a Pleur-Evac at -20 cm water. Followup axial CT imaging demonstrates near-total reinflation of the lung with only minimal residual basilar pneumothorax. The thoracostomy tube was secured to the skin with 0 Prolene suture and a sterile bandage was placed.  The patient tolerated the procedure very well.  COMPLICATIONS: Postprocedural pneumothorax requiring chest tube placement.  IMPRESSION: 1. Technically successful CT-guided lung biopsy of right upper lobe pulmonary nodule. 2. Placement of a 10  French percutaneous thoracostomy tube for treatment of immediate enlarging postprocedural pneumothorax. The patient remained hemodynamically stable and clinically asymptomatic throughout the procedure. He will be admitted for observation and management of the chest tube. Signed,  Criselda Peaches, MD  [facility]  [facility]   Electronically Signed   By: Jacqulynn Cadet M.D.   On: 11/28/2013 17:38   Dg Chest Port 1 View  11/30/2013   CLINICAL DATA:  Status post chest tube removal.  EXAM: PORTABLE CHEST - 1 VIEW  COMPARISON:  11/30/2013  FINDINGS: Since prior study, the right sided chest tube has been removed. There is no evidence of a pneumothorax.  There is persistent lung base opacity, likely atelectasis. Right upper lobe nodule described on the earlier study is unchanged. Lungs are hyperexpanded but otherwise clear. Cardiac silhouette is normal in size. Normal mediastinal and hilar  contours.  IMPRESSION: No pneumothorax following right-sided chest tube removal. No other change from the prior study.   Electronically Signed   By: Lajean Manes M.D.   On: 11/30/2013 14:18   Dg Chest Port 1 View  11/30/2013   CLINICAL DATA:  Chest tube clamping.  EXAM: PORTABLE CHEST - 1 VIEW  COMPARISON:  Earlier same date and 11/29/2013.  FINDINGS: 1204 hr. Pigtail catheter on the right pleural space appears unchanged. No recurrent pneumothorax identified. The heart size and mediastinal contours are stable. The lungs are hyperinflated with a stable right upper lobe nodule.  IMPRESSION: No evidence of recurrent pneumothorax.   Electronically Signed   By: Camie Patience M.D.   On: 11/30/2013 12:19   Dg Chest Port 1 View  11/30/2013   CLINICAL DATA:  Chest tube.  EXAM: PORTABLE CHEST - 1 VIEW  COMPARISON:  Chest x-ray 11/29/2013.  Chest CT 10/24/2013.  FINDINGS: Right chest tube in stable position. Mediastinum and hilar structures are unremarkable. Stable cardiomegaly. Normal pulmonary vascularity. Persistent pulmonary nodule right upper lung. Mild basilar atelectasis. No significant pneumothorax noted. No acute bony abnormality.  IMPRESSION: 1. Right chest tube in stable position. No evidence of pneumothorax. 2. Pulmonary nodule right upper lung unchanged. 3. Bibasilar atelectasis. 4. Stable cardiomegaly.   Electronically Signed   By: Marcello Moores  Register   On: 11/30/2013 08:11   Portable Chest 1 View  11/29/2013   CLINICAL DATA:  Chest tube.  Evaluate pneumothorax.  EXAM: PORTABLE CHEST - 1 VIEW  COMPARISON:  11/28/2013  FINDINGS: No residual pneumothorax. Mild medial lung base opacity is stable likely atelectasis. Lungs are hyperexpanded but otherwise clear. Right-sided chest tube is unchanged.  Cardiopericardial silhouette is normal in size. Normal mediastinal and hilar contours.  IMPRESSION: Stable chest tube. No residual pneumothorax. Mild stable lung base atelectasis. No change from the previous exam  allowing for differences in technique.   Electronically Signed   By: Lajean Manes M.D.   On: 11/29/2013 09:35   Dg Chest Port 1 View  11/28/2013   CLINICAL DATA:  Right-sided chest tube placement for pneumothorax status post lung biopsy.  EXAM: PORTABLE CHEST - 1 VIEW  COMPARISON:  Images from CT-guided lung biopsy earlier today. Chest CT 10/24/2013. Chest radiographs 03/15/2013.  FINDINGS: The cardiomediastinal silhouette is within normal limits. A right-sided pigtail catheter overlies the right upper lung. No pneumothorax is identified. Emphysematous changes are noted in the lungs. Patchy opacities are present in both lung bases. Known right upper lobe lung nodule is not well seen. No large pleural effusion is identified. No acute osseous abnormality is identified.  IMPRESSION: 1. Right chest tube in place.  No pneumothorax identified. 2. Bibasilar lung opacities, likely atelectasis.   Electronically Signed   By: Logan Bores   On: 11/28/2013 12:06   Ct Perc Pleural Drain W/indwell Cath W/img Guide  11/28/2013   CLINICAL DATA:  53 year old male with HIV and long history of smoking. A pulmonary nodule is identified on screening chest CT. Subsequent PET-CT demonstrates positive hypermetabolic activity concerning for primary bronchogenic carcinoma. CT-guided biopsy is warranted to facilitate tissue diagnosis and a treatment planning. Given the surrounding emphysema, the patient is very high risk for pneumothorax. He understands and wishes to proceed.  EXAM: CT BIOPSY; CT PERC PLEURAL DRAIN W/INDWELL CATH W/IMG GUIDE  Date: 11/28/2013  PROCEDURE: 1. CT-guided core biopsy of right upper lobe pulmonary nodule 2. Placement of a 10 French percutaneous thoracostomy tube Interventional Radiologist:  Criselda Peaches, MD  ANESTHESIA/SEDATION: Moderate (conscious) sedation was used. Five mg Versed, 200 mcg Fentanyl were administered intravenously. The patient's vital signs were monitored continuously by radiology  nursing throughout the procedure.  Sedation Time: 56 minutes  TECHNIQUE: Informed consent was obtained from the patient following explanation of the procedure, risks, benefits and alternatives. The patient understands, agrees and consents for the procedure. All questions were addressed. A time out was performed.  A planning axial CT scan was performed. The right upper lobe pulmonary nodule was identified. A suitable skin entry site was selected and marked. Local anesthesia was attained by infiltration with 1% lidocaine. Under intermittent CT fluoroscopic guidance, a 17 gauge trocar needle was carefully advanced through the right posterolateral chest wall and into the inferior aspect of the nodule. A total of two 18 gauge core biopsies were then coaxially obtained while directing the needle cephalad into the central mass of the nodule. Biopsy specimens were placed in formalin and sent to pathology for further analysis.  10 mL of peripheral blood was then aspirated from the patient's IV in used as a parenchymal blood patch while removing the outer trocar needle. A post biopsy axial CT scan demonstrates a small immediate postprocedural pneumothorax. Given the patient's relatively high risk of progressive pneumothorax he was repositioned on no is back. After a 10 min delay a repeat CT scan was performed. The pneumothorax was rapidly expanding. Therefore, the decision was made to proceed with percutaneous thoracostomy tube placement.  The CT images were used to select a skin entry site which was marked. The right anterolateral chest wall was then sterilely prepped and draped in the usual fashion. Local anesthesia was attained by infiltration with 1% lidocaine. Using routine trocar technique, a Cook 10 [place] multipurpose drain was advanced into the pleural space and directed apically over the lung. The locking loop was formed and the chest tube was connected to wall suction via a Pleur-Evac at -20 cm water. Followup  axial CT imaging demonstrates near-total reinflation of the lung with only minimal residual basilar pneumothorax. The thoracostomy tube was secured to the skin with 0 Prolene suture and a sterile bandage was placed.  The patient tolerated the procedure very well.  COMPLICATIONS: Postprocedural pneumothorax requiring chest tube placement.  IMPRESSION: 1. Technically successful CT-guided lung biopsy of right upper lobe pulmonary nodule. 2. Placement of a 10 French percutaneous thoracostomy tube for treatment of immediate enlarging postprocedural pneumothorax. The patient remained hemodynamically stable and clinically asymptomatic throughout the procedure. He will be admitted for observation and management of the chest tube. Signed,  Criselda Peaches, MD  [facility]  [facility]   Electronically Signed   By: Jacqulynn Cadet M.D.   On: 11/28/2013 17:38    ASSESSMENT: This is a very pleasant 53 years old African American male recently diagnosed with extensive stage (T1a, N1, M1b) small cell lung, diagnosed in June of 2015, presented with right upper lobe lung nodule in addition to a right hilar and infrahilar adenopathy as well as questionable right adrenal gland lesion.   PLAN: I had a lengthy discussion with the patient and his girlfriend today about his current disease stage, prognosis and treatment options. I explained to the patient that he has an incurable condition and the treatment would be of palliative nature. I discussed with the patient his treatment options including palliative care and hospice referral versus consideration of systemic chemotherapy with carboplatin for AUC of 5 on day 1 and etoposide at 100 mg/M2 on days 1, 2 and 3 with Neulasta support on day 4. I discussed with the patient adverse effect of the chemotherapy including but not limited to alopecia, myelosuppression, nausea and vomiting, peripheral neuropathy, liver or renal  dysfunction. I also explained to the patient that his treatment will be a little bit more risky compared to other patients because of his history of HIV and immunosuppressive effect of the treatment. The patient would like to proceed with his systemic chemotherapy as planned. She is expected to start the first cycle of this treatment on 12/18/2013. I will arrange for the patient to have a Port-A-Cath placed by Dr. [name] before starting the first cycle of his systemic chemotherapy. I will arrange for the patient to have a chemotherapy education class before starting the first dose of his treatment. I will Escribe Compazine 10 mg by mouth every 6 hours as needed for nausea in addition to Emla Cream to be applied to the Port-A-Cath site before chemotherapy. He may be considered for radiotherapy for any residual disease after completion of 4 cycles of systemic chemotherapy. He was seen by Dr. Pablo Ledger for discussion of this option. The patient was seen during his multidisciplinary thoracic oncology clinic today by medical oncology, radiation oncology, thoracic multidetector, social worker as well as physical therapist. He would come back for followup visit in 3 weeks for reevaluation and management any adverse effect of his treatment. The patient was advised to call immediately if he has any concerning symptoms in the interval.   The patient voices understanding of current disease status and treatment options and is in agreement with the current care plan.  All questions were answered. The patient knows to call the clinic with any problems, questions or concerns. We can certainly see the patient much sooner if necessary.  Thank you so much for allowing me to participate in the care of RIMAS GILHAM. I will continue to follow up the patient with you and assist in his care.  I spent 55 minutes counseling the patient face to face. The total time spent in the appointment was 80  minutes.  Disclaimer: This note was dictated with voice recognition software. Similar sounding words can inadvertently be transcribed and may not be corrected upon review.   Danyael Alipio K. 12/07/2013, 3:24 PM

## 2013-12-07 NOTE — Telephone Encounter (Signed)
Per staff phone call and POF I have schedueld appts. Scheduler advised of appts.  JMW  

## 2013-12-08 ENCOUNTER — Ambulatory Visit (HOSPITAL_COMMUNITY): Payer: 59 | Admitting: Certified Registered"

## 2013-12-08 ENCOUNTER — Encounter (HOSPITAL_COMMUNITY): Payer: 59 | Admitting: Certified Registered"

## 2013-12-08 ENCOUNTER — Ambulatory Visit (HOSPITAL_COMMUNITY)
Admission: AD | Admit: 2013-12-08 | Discharge: 2013-12-08 | Disposition: A | Discharge status: Home / Self Care | Payer: 59 | Source: Ambulatory Visit | Attending: Cardiothoracic Surgery | Admitting: Cardiothoracic Surgery

## 2013-12-08 ENCOUNTER — Encounter: Payer: Self-pay | Admitting: Internal Medicine

## 2013-12-08 ENCOUNTER — Ambulatory Visit (HOSPITAL_COMMUNITY): Payer: 59

## 2013-12-08 ENCOUNTER — Encounter (HOSPITAL_COMMUNITY): Payer: Self-pay | Admitting: *Deleted

## 2013-12-08 ENCOUNTER — Other Ambulatory Visit: Payer: Self-pay | Admitting: *Deleted

## 2013-12-08 ENCOUNTER — Other Ambulatory Visit: Payer: 59

## 2013-12-08 ENCOUNTER — Encounter (HOSPITAL_COMMUNITY)
Admission: AD | Disposition: A | Discharge status: Home / Self Care | Payer: Self-pay | Source: Ambulatory Visit | Attending: Cardiothoracic Surgery

## 2013-12-08 DIAGNOSIS — J4489 Other specified chronic obstructive pulmonary disease: Secondary | ICD-10-CM | POA: Insufficient documentation

## 2013-12-08 DIAGNOSIS — C3410 Malignant neoplasm of upper lobe, unspecified bronchus or lung: Secondary | ICD-10-CM

## 2013-12-08 DIAGNOSIS — Z21 Asymptomatic human immunodeficiency virus [HIV] infection status: Secondary | ICD-10-CM | POA: Insufficient documentation

## 2013-12-08 DIAGNOSIS — N62 Hypertrophy of breast: Secondary | ICD-10-CM | POA: Insufficient documentation

## 2013-12-08 DIAGNOSIS — E785 Hyperlipidemia, unspecified: Secondary | ICD-10-CM | POA: Insufficient documentation

## 2013-12-08 DIAGNOSIS — I10 Essential (primary) hypertension: Secondary | ICD-10-CM | POA: Insufficient documentation

## 2013-12-08 DIAGNOSIS — C3491 Malignant neoplasm of unspecified part of right bronchus or lung: Secondary | ICD-10-CM

## 2013-12-08 DIAGNOSIS — F17200 Nicotine dependence, unspecified, uncomplicated: Secondary | ICD-10-CM | POA: Insufficient documentation

## 2013-12-08 DIAGNOSIS — J449 Chronic obstructive pulmonary disease, unspecified: Secondary | ICD-10-CM | POA: Insufficient documentation

## 2013-12-08 DIAGNOSIS — C3490 Malignant neoplasm of unspecified part of unspecified bronchus or lung: Secondary | ICD-10-CM

## 2013-12-08 DIAGNOSIS — G4700 Insomnia, unspecified: Secondary | ICD-10-CM | POA: Insufficient documentation

## 2013-12-08 HISTORY — DX: Malignant neoplasm of unspecified part of unspecified bronchus or lung: C34.90

## 2013-12-08 HISTORY — DX: Other pulmonary collapse: J98.19

## 2013-12-08 HISTORY — PX: PORTACATH PLACEMENT: SHX2246

## 2013-12-08 HISTORY — DX: Personal history of pneumonia (recurrent): Z87.01

## 2013-12-08 HISTORY — DX: Personal history of other mental and behavioral disorders: Z86.59

## 2013-12-08 SURGERY — INSERTION, TUNNELED CENTRAL VENOUS DEVICE, WITH PORT
Anesthesia: General

## 2013-12-08 MED ORDER — DEXTROSE 5 % IV SOLN
1.5000 g | INTRAVENOUS | Status: AC
  Administered 2013-12-08: 1.5 g via INTRAVENOUS
  Filled 2013-12-08: qty 1.5

## 2013-12-08 MED ORDER — LACTATED RINGERS IV SOLN: INTRAVENOUS | Status: DC

## 2013-12-08 MED ORDER — HYDROCODONE-ACETAMINOPHEN 5-325 MG PO TABS: 1.0000 | ORAL_TABLET | Freq: Four times a day (QID) | ORAL | Status: DC | PRN

## 2013-12-08 MED ORDER — FENTANYL CITRATE 0.05 MG/ML IJ SOLN
INTRAMUSCULAR | Status: DC | PRN
  Administered 2013-12-08 (×2): 50 ug via INTRAVENOUS

## 2013-12-08 MED ORDER — SODIUM CHLORIDE 0.9 % IR SOLN
Status: DC | PRN
  Administered 2013-12-08: 08:00:00

## 2013-12-08 MED ORDER — HEPARIN SODIUM (PORCINE) 1000 UNIT/ML IJ SOLN
INTRAMUSCULAR | Status: DC | PRN
  Administered 2013-12-08: 3000 [IU] via INTRAVENOUS

## 2013-12-08 MED ORDER — MIDAZOLAM HCL 2 MG/2ML IJ SOLN
INTRAMUSCULAR | Status: AC
  Filled 2013-12-08: qty 2

## 2013-12-08 MED ORDER — HEPARIN SODIUM (PORCINE) 1000 UNIT/ML IJ SOLN
INTRAMUSCULAR | Status: AC
  Filled 2013-12-08: qty 1

## 2013-12-08 MED ORDER — FENTANYL CITRATE 0.05 MG/ML IJ SOLN
INTRAMUSCULAR | Status: AC
  Filled 2013-12-08: qty 5

## 2013-12-08 MED ORDER — SODIUM CHLORIDE 0.9 % IR SOLN
Status: DC | PRN
  Administered 2013-12-08: 1000 mL

## 2013-12-08 MED ORDER — LIDOCAINE HCL (PF) 1 % IJ SOLN
INTRAMUSCULAR | Status: DC | PRN
  Administered 2013-12-08: 14 mL

## 2013-12-08 MED ORDER — LIDOCAINE-PRILOCAINE 2.5-2.5 % EX CREA: 1.0000 "application " | TOPICAL_CREAM | CUTANEOUS | Status: DC | PRN

## 2013-12-08 MED ORDER — PROPOFOL 10 MG/ML IV BOLUS
INTRAVENOUS | Status: AC
  Filled 2013-12-08: qty 20

## 2013-12-08 MED ORDER — LIDOCAINE HCL (PF) 1 % IJ SOLN
INTRAMUSCULAR | Status: AC
  Filled 2013-12-08: qty 30

## 2013-12-08 MED ORDER — HYDROMORPHONE HCL PF 1 MG/ML IJ SOLN
INTRAMUSCULAR | Status: AC
  Filled 2013-12-08: qty 1

## 2013-12-08 MED ORDER — PROCHLORPERAZINE MALEATE 10 MG PO TABS: 10.0000 mg | ORAL_TABLET | Freq: Four times a day (QID) | ORAL | Status: DC | PRN

## 2013-12-08 MED ORDER — MIDAZOLAM HCL 5 MG/5ML IJ SOLN
INTRAMUSCULAR | Status: DC | PRN
Start: 2013-12-08 — End: 2013-12-08
  Administered 2013-12-08 (×2): 1 mg via INTRAVENOUS

## 2013-12-08 MED ORDER — PROPOFOL INFUSION 10 MG/ML OPTIME
INTRAVENOUS | Status: DC | PRN
  Administered 2013-12-08: 100 ug/kg/min via INTRAVENOUS

## 2013-12-08 MED ORDER — LIDOCAINE HCL (CARDIAC) 20 MG/ML IV SOLN
INTRAVENOUS | Status: AC
  Filled 2013-12-08: qty 5

## 2013-12-08 MED ORDER — LACTATED RINGERS IV SOLN
INTRAVENOUS | Status: DC | PRN
  Administered 2013-12-08: 07:00:00 via INTRAVENOUS

## 2013-12-08 MED ORDER — HYDROMORPHONE HCL PF 1 MG/ML IJ SOLN
0.2500 mg | INTRAMUSCULAR | Status: DC | PRN
  Administered 2013-12-08: 0.5 mg via INTRAVENOUS

## 2013-12-08 MED ORDER — HEPARIN SOD (PORK) LOCK FLUSH 100 UNIT/ML IV SOLN
INTRAVENOUS | Status: AC
  Filled 2013-12-08: qty 5

## 2013-12-08 SURGICAL SUPPLY — 46 items
ADH SKN CLS APL DERMABOND .7 (GAUZE/BANDAGES/DRESSINGS) ×1
BAG DECANTER FOR FLEXI CONT (MISCELLANEOUS) ×2 IMPLANT
BLADE SURG 11 STRL SS (BLADE) ×2 IMPLANT
CANISTER SUCTION 2500CC (MISCELLANEOUS) ×2 IMPLANT
COVER PROBE W GEL 5X96 (DRAPES) ×2 IMPLANT
COVER SURGICAL LIGHT HANDLE (MISCELLANEOUS) ×2 IMPLANT
DERMABOND ADVANCED (GAUZE/BANDAGES/DRESSINGS) ×1
DERMABOND ADVANCED .7 DNX12 (GAUZE/BANDAGES/DRESSINGS) ×1 IMPLANT
DRAPE C-ARM 42X72 X-RAY (DRAPES) ×2 IMPLANT
DRAPE CHEST BREAST 15X10 FENES (DRAPES) ×2 IMPLANT
ELECT CAUTERY BLADE 6.4 (BLADE) ×2 IMPLANT
ELECT REM PT RETURN 9FT ADLT (ELECTROSURGICAL) ×2
ELECTRODE REM PT RTRN 9FT ADLT (ELECTROSURGICAL) ×1 IMPLANT
GLOVE BIO SURGEON STRL SZ 6.5 (GLOVE) ×7 IMPLANT
GLOVE BIOGEL PI IND STRL 6 (GLOVE) IMPLANT
GLOVE BIOGEL PI IND STRL 6.5 (GLOVE) IMPLANT
GLOVE BIOGEL PI INDICATOR 6 (GLOVE) ×1
GLOVE BIOGEL PI INDICATOR 6.5 (GLOVE) ×1
GOWN STRL REUS W/ TWL LRG LVL3 (GOWN DISPOSABLE) ×2 IMPLANT
GOWN STRL REUS W/TWL LRG LVL3 (GOWN DISPOSABLE) ×4
GUIDEWIRE UNCOATED ST S 7038 (WIRE) IMPLANT
INTRODUCER 13FR (MISCELLANEOUS) IMPLANT
INTRODUCER COOK 11FR (CATHETERS) IMPLANT
KIT BASIN OR (CUSTOM PROCEDURE TRAY) ×2 IMPLANT
KIT PORT POWER 9.6FR MRI PREA (Catheter) ×1 IMPLANT
KIT PORT POWER ISP 8FR (Catheter) IMPLANT
KIT POWER CATH 8FR (Catheter) IMPLANT
KIT ROOM TURNOVER OR (KITS) ×2 IMPLANT
NDL HYPO 25GX1X1/2 BEV (NEEDLE) ×1 IMPLANT
NEEDLE 22X1 1/2 (OR ONLY) (NEEDLE) IMPLANT
NEEDLE HYPO 25GX1X1/2 BEV (NEEDLE) ×2 IMPLANT
NS IRRIG 1000ML POUR BTL (IV SOLUTION) ×2 IMPLANT
PACK GENERAL/GYN (CUSTOM PROCEDURE TRAY) ×2 IMPLANT
PAD ARMBOARD 7.5X6 YLW CONV (MISCELLANEOUS) ×4 IMPLANT
SET SHEATH INTRODUCER 10FR (MISCELLANEOUS) IMPLANT
SPONGE GAUZE 4X4 12PLY (GAUZE/BANDAGES/DRESSINGS) ×2 IMPLANT
SUT SILK 2 0 SH (SUTURE) ×2 IMPLANT
SUT VIC AB 3-0 SH 18 (SUTURE) ×2 IMPLANT
SUT VICRYL 4-0 PS2 18IN ABS (SUTURE) ×3 IMPLANT
SYR 20CC LL (SYRINGE) ×2 IMPLANT
SYR 5ML LUER SLIP (SYRINGE) IMPLANT
SYR CONTROL 10ML LL (SYRINGE) ×2 IMPLANT
TAPE CLOTH SURG 4X10 WHT LF (GAUZE/BANDAGES/DRESSINGS) ×1 IMPLANT
TOWEL OR 17X24 6PK STRL BLUE (TOWEL DISPOSABLE) ×2 IMPLANT
TOWEL OR 17X26 10 PK STRL BLUE (TOWEL DISPOSABLE) ×2 IMPLANT
WATER STERILE IRR 1000ML POUR (IV SOLUTION) ×2 IMPLANT

## 2013-12-08 NOTE — Transfer of Care (Signed)
Immediate Anesthesia Transfer of Care Note  Patient: Jaime Morris  Procedure(s) Performed: Procedure(s): INSERTION PORT-A-CATH (N/A)  Patient Location: PACU  Anesthesia Type:MAC  Level of Consciousness: awake  Airway & Oxygen Therapy: Patient Spontanous Breathing and Patient connected to nasal cannula oxygen  Post-op Assessment: Report given to PACU RN and Post -op Vital signs reviewed and stable  Post vital signs: Reviewed and stable  Complications: No apparent anesthesia complications

## 2013-12-08 NOTE — Anesthesia Preprocedure Evaluation (Addendum)
Anesthesia Evaluation  Patient identified by MRN, date of birth, ID band Patient awake    Reviewed: Allergy & Precautions, H&P , NPO status   Airway Mallampati: II      Dental  (+) Dental Advisory Given Partial out:   Pulmonary Current Smoker,  breath sounds clear to auscultation        Cardiovascular hypertension, Rhythm:Regular Rate:Normal     Neuro/Psych    GI/Hepatic negative GI ROS, Neg liver ROS,   Endo/Other  negative endocrine ROS  Renal/GU negative Renal ROS  negative genitourinary   Musculoskeletal   Abdominal   Peds  Hematology  (+) HIV,   Anesthesia Other Findings   Reproductive/Obstetrics                         Anesthesia Physical Anesthesia Plan  ASA: III  Anesthesia Plan: General   Post-op Pain Management:    Induction: Intravenous  Airway Management Planned: LMA  Additional Equipment:   Intra-op Plan:   Post-operative Plan: Extubation in OR  Informed Consent: I have reviewed the patients History and Physical, chart, labs and discussed the procedure including the risks, benefits and alternatives for the proposed anesthesia with the patient or authorized representative who has indicated his/her understanding and acceptance.   Dental advisory given  Plan Discussed with: CRNA and Anesthesiologist  Anesthesia Plan Comments:         Anesthesia Quick Evaluation

## 2013-12-08 NOTE — Discharge Instructions (Signed)
Implanted Port Insertion, Care After Refer to this sheet in the next few weeks. These instructions provide you with information on caring for yourself after your procedure. Your health care provider may also give you more specific instructions. Your treatment has been planned according to current medical practices, but problems sometimes occur. Call your health care provider if you have any problems or questions after your procedure. WHAT TO EXPECT AFTER THE PROCEDURE After your procedure, it is typical to have the following:   Discomfort at the port insertion site. Ice packs to the area will help.  Bruising on the skin over the port. This will subside in 3-4 days. HOME CARE INSTRUCTIONS  After your port is placed, you will get a manufacturer's information card. The card has information about your port. Keep this card with you at all times.   Know what kind of port you have. There are many types of ports available.   Wear a medical alert bracelet in case of an emergency. This can help alert health care workers that you have a port.   The port can stay in for as long as your health care provider believes it is necessary.   A home health care nurse may give medicines and take care of the port.   You or a family member can get special training and directions for giving medicine and taking care of the port at home.  SEEK MEDICAL CARE IF:   Your port does not flush or you are unable to get a blood return.   You have a fever or chills. SEEK IMMEDIATE MEDICAL CARE IF:  You have new fluid or pus coming from your incision.   You notice a bad smell coming from your incision site.   You have swelling, pain, or more redness at the incision or port site.   You have chest pain or shortness of breath. Document Released: 03/22/2013 Document Revised: 06/06/2013 Document Reviewed: 03/22/2013 [facility] Patient Information 2015 [place], [place]. This information is not intended to replace  advice given to you by your health care provider. Make sure you discuss any questions you have with your health care provider.   What to eat:  For your first meals, you should eat lightly; only small meals initially.  If you do not have nausea, you may eat larger meals.  Avoid spicy, greasy and heavy food.    General Anesthesia, Adult, Care After  Refer to this sheet in the next few weeks. These instructions provide you with information on caring for yourself after your procedure. Your health care provider may also give you more specific instructions. Your treatment has been planned according to current medical practices, but problems sometimes occur. Call your health care provider if you have any problems or questions after your procedure.  WHAT TO EXPECT AFTER THE PROCEDURE  After the procedure, it is typical to experience:  Sleepiness.  Nausea and vomiting. HOME CARE INSTRUCTIONS  For the first 24 hours after general anesthesia:  Have a responsible person with you.  Do not drive a car. If you are alone, do not take public transportation.  Do not drink alcohol.  Do not take medicine that has not been prescribed by your health care provider.  Do not sign important papers or make important decisions.  You may resume a normal diet and activities as directed by your health care provider.  Change bandages (dressings) as directed.  If you have questions or problems that seem related to general anesthesia, call the  hospital and ask for the anesthetist or anesthesiologist on call. SEEK MEDICAL CARE IF:  You have nausea and vomiting that continue the day after anesthesia.  You develop a rash. SEEK IMMEDIATE MEDICAL CARE IF:  You have difficulty breathing.  You have chest pain.  You have any allergic problems. Document Released: 09/07/2000 Document Revised: 02/01/2013 Document Reviewed: 12/15/2012  [facility] Patient Information 2014 [place], [place].

## 2013-12-08 NOTE — Progress Notes (Signed)
Spoke with Dr. [name] ok to cancel PT/ PTT

## 2013-12-08 NOTE — H&P (Signed)
[address]       [place] [address]             [phone]                    Jaime Morris [name] Medical Record #[id number] Date of Birth: 01/25/1961  Referring: No ref. provider found Primary Care: Jaime Shelling, MD  Chief Complaint:    No chief complaint on file.   History of Present Illness:    Jaime Morris 53 y.o. male is seen in the office  today for abnormal lung cancer screening CT. Patient has been long term smoker . He has been followed for COPD but is not significantly limited in his respiratory reserve. He is followed in the infectious disease clinic for HIV, with undetectable counts noted in November of 2014.    He denies any history of hemoptysis. 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   Zubrod Score: At the time of surgery this patient's most appropriate activity status/level should be described as: [x]     0    Normal activity, no symptoms []     1    Restricted in physical strenuous activity but ambulatory, able to do out light work []     2    Ambulatory and capable of self care, unable to do work activities, up and about               >50 % of waking hours                              []     3    Only limited self care, in bed greater than 50% of waking hours []     4    Completely disabled, no self care, confined to bed or chair []     5    Moribund   Past Medical History  Diagnosis Date  . HIV infection     DR. Linus Morris  . Hyperlipidemia   . Insomnia   . Lung nodule, multiple 10/24/13    CT CHEST W/O...DR. Jenny Reichmann Morris  . Gynecomastia, male 10/16/13    MILD RIGHT SIDED PER RADIOLOGY STUDY  . COPD (chronic obstructive pulmonary disease)     PFT'S 10/26/13 @ [facility]    Past Surgical History  Procedure Laterality Date  . Foot surgery      BUNIONECTOMY  . Back surgery     L 4-5 FUSION WITH SCREW  . Colonoscopy  UTD    Family History  Problem Relation Age of Onset  . Stroke Mother   . Heart disease Father   . Sarcoidosis Sister     History   Social History  . Marital Status: Single    Spouse Name: N/A    Number of Children: N/A  . Years of Education: N/A   Occupational History  . Not on file.   Social History Main Topics  . Smoking status: Current Every Day Smoker -- 1.00 packs/day for 38 years    Types: Cigarettes  . Smokeless tobacco: Current User    Types: Chew  . Alcohol Use: No     Comment: in alcohol treatment/04/20/13  . Drug Use: No  . Sexual Activity: No     Comment: pt. declined condoms  Other Topics Concern  . Works from home , Therapist, art for [employer]           History  Smoking status  . Current Every Day Smoker -- 1.00 packs/day for 38 years  . Types: Cigarettes  Smokeless tobacco  . Current User  . Types: Chew    History  Alcohol Use No    Comment: in alcohol treatment/04/20/13     No Known Allergies  Current Facility-Administered Medications  Medication Dose Route Frequency Provider Last Rate Last Dose  . cefUROXime (ZINACEF) 1.5 g in dextrose 5 % 50 mL IVPB  1.5 g Intravenous 60 min Pre-Op Jaime Isaac, MD      . lactated ringers infusion   Intravenous Continuous Jaime Sable, MD         Review of Systems:     Cardiac Review of Systems: Y or N  Chest Pain [ n   ]  Resting SOB [ n  ] Exertional SOB  Blue.Reese  ]  Vertell Limber Florencio.Farrier  ]   Pedal Edema [ n  ]    Palpitations Florencio.Farrier  ] Syncope  [ n ]   Presyncope [  n ]  General Review of Systems: [Y] = yes [  ]=no Constitional: recent weight change [ n ];  Wt loss over the last 3 months [   ] anorexia [  ]; fatigue [  ]; nausea [  ]; night sweats [  ]; fever [  ]; or chills [  ];          Dental: poor dentition[ n ]; Last Dentist visit:   Eye : blurred vision [n  ]; diplopia [   ]; vision changes [  ];  Amaurosis fugax[  ]; Resp: cough Blue.Reese  ];  wheezing[  n ];  hemoptysis[n  ]; shortness of breath[y  ]; paroxysmal nocturnal dyspnea[  ]; dyspnea on exertion[ y ]; or orthopnea[  ];  GI:  gallstones[  ], vomiting[  ];  dysphagia[  ]; melena[  ];  hematochezia [  ]; heartburn[  ];   Hx of  Colonoscopy[  ]; GU: kidney stones [  ]; hematuria[  ];   dysuria [  ];  nocturia[ y ];  history of     obstruction [  ]; urinary frequency [ n ]             Skin: rash, swelling[  ];, hair loss[  ];  peripheral edema[  ];  or itching[  ]; Musculosketetal: myalgias[  ];  joint swelling[  ];  joint erythema[  ];  joint pain[  ];  back pain[  ];  Heme/Lymph: bruising[  ];  bleeding[  ];  anemia[  ];  Neuro: TIA[  ];  headaches[  ];  stroke[  ];  vertigo[  ];  seizures[ n ];   paresthesias[  ];  difficulty walking[n  ];  Psych:depression[  ]; anxiety[  ];  Endocrine: diabetes[n  ];  thyroid dysfunction[ n ];  Immunizations: Flu up to date Blue.Reese  ]; Pneumococcal up to date [ y ];  Other:  Physical Exam: BP 164/88  Pulse 80  Temp(Src) 98.1 F (36.7 C) (Oral)  Resp 18  Wt 188 lb (85.276 kg)  SpO2 99%  PHYSICAL EXAMINATION:  General appearance: alert, cooperative, appears stated age and no distress Neurologic: intact Heart: regular rate and rhythm, S1, S2 normal, no murmur, click, rub or gallop Lungs: clear to auscultation bilaterally Abdomen:  soft, non-tender; bowel sounds normal; no masses,  no organomegaly Extremities: extremities normal, atraumatic, no cyanosis or edema and Homans sign is negative, no sign of DVT Patient has carotid bruits, no cervical or supraclavicular or axillary adenopathy  Diagnostic Studies & Laboratory data:     Recent Radiology Findings:   Nm Pet Image Initial (pi) Skull Base To Thigh  11/01/2013   CLINICAL DATA:  Initial treatment strategy for the lung nodule.  EXAM: NUCLEAR MEDICINE PET SKULL BASE TO THIGH  TECHNIQUE: 9.7 mCi F-18 FDG was injected intravenously. Full-ring PET imaging was performed from the skull base to thigh  after the radiotracer. CT data was obtained and used for attenuation correction and anatomic localization.  FASTING BLOOD GLUCOSE:  Value: 112 mg/dl  COMPARISON:  10/24/2013  FINDINGS: NECK  No hypermetabolic lymph nodes in the neck. Chronic ethmoid, left sphenoid, and bilateral maxillary sinusitis. The  CHEST  The 1.4 cm right upper lobe pulmonary nodule has a maximum standard uptake value of 7.5. Conglomerate right hilar and infrahilar adenopathy maximum standard uptake value 10.1. Mildly hypermetabolic left infrahilar lymph node, maximum standard uptake value 3.2.  Severe emphysema noted. Faint nodularity anteriorly in the lingula is not hypermetabolic but the tiny nodules are below sensitive PET-CT size thresholds. Incidental partial anomalous pulmonary venous return, left upper lobe.  ABDOMEN/PELVIS  Hypermetabolic lateral limb of the right adrenal gland with only equivocal thickening in this vicinity on the CT data. Maximum standard uptake value 5.0.  Enlarged prostate gland, 6.0 by 4.8 cm.  SKELETON  Negative for malignancy. Grade 2 anterolisthesis at L5-S1 with posterolateral rod and pedicle screw fixations at L4 and S1.  IMPRESSION: 1. Hypermetabolic right upper lobe nodule associated with hypermetabolic right hilar and infrahilar adenopathy, suspicion for hypermetabolic left infrahilar adenopathy, and a small hypermetabolic nodule of the lateral limb right adrenal gland suspicious for a metastatic lesion. 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 2. 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   Electronically Signed   By: Sherryl Barters M.D.   On: 11/01/2013 15:41   The patient's PET scan was reviewed with radiology at the [facility] and  also with interventional radiology. 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 Interventional radiology declined attempting a needle biopsy of the right adrenal gland because of its extremely small size and lack of any specific lesion.    Mm Diag Breast Tomo Bilateral  10/25/2013   CLINICAL DATA:  Patient notes tenderness fullness within the subareolar portion of the right breast.  EXAM: DIGITAL DIAGNOSTIC BILATERAL MAMMOGRAM WITH TOMOSYNTHESIS AND CAD  DIGITAL BREAST TOMOSYNTHESIS  Digital breast tomosynthesis images are acquired in two projections. These images are reviewed in combination with the digital mammogram, confirming the findings below.  COMPARISON:  None  ACR Breast Density Category a: The breast tissue is almost entirely fatty.  FINDINGS: There is mild asymmetrical fibroglandular tissue within the subareolar portion the right breast most consistent with gynecomastia. There is no mass, distortion, or worrisome calcification within either breast.  Mammographic images were processed with CAD.  On my directed physical examination of the right breast there is mild soft fullness within the subareolar portion right breast consistent with gynecomastia.  IMPRESSION: Mild right-sided gynecomastia. No findings worrisome for malignancy.  RECOMMENDATION:  Follow-up Breast self-examination.  I have discussed the findings and recommendations with the patient. Results were also provided in writing at the conclusion of the visit. If applicable, a reminder letter will be sent to the patient regarding the next appointment.  BI-RADS CATEGORY  2: Benign.   Electronically Signed   By: Luberta Robertson M.D.   On: 10/25/2013 12:07   Ct Chest Wo/cm Screening  10/24/2013   CLINICAL DATA:  53 year old male with 35 pack-year history of smoking, currently smoking. Lung cancer screening examination.  EXAM: CT CHEST SCREENING WITHOUT CONTRAST  TECHNIQUE: Multidetector CT imaging of the  chest was performed following the standard low-dose protocol without IV contrast.  COMPARISON:  No priors.  FINDINGS: Mediastinum: Heart size is normal. There is no significant pericardial fluid, thickening or pericardial calcification. There is atherosclerosis of the thoracic aorta, the great vessels of the mediastinum and the coronary arteries, including calcified atherosclerotic plaque in the left anterior descending coronary arteries. No pathologically enlarged mediastinal or hilar lymph nodes. Please note that accurate exclusion of hilar adenopathy is limited on noncontrast CT scans. Esophagus is unremarkable in appearance. 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  Lungs/Pleura: 19 x 14 mm (mean diameter of 16.5 mm) macrolobulated pleural based nodule in the posterior aspect of the right upper lobe (image 89 of series 3) is highly concerning for potential bronchogenic neoplasm. Several other scattered 2-4 mm pulmonary nodules are noted in the lungs bilaterally. No acute consolidative airspace disease. No pleural effusions. Moderate centrilobular and paraseptal emphysema, most pronounced throughout the lung apices bilaterally. Diffuse bronchial wall thickening. Extensive scarring and architectural distortion in the right middle lobe and inferior segment of the lingula.  Upper Abdomen: Unremarkable.  Musculoskeletal: There are no aggressive appearing lytic or blastic lesions noted in the visualized portions of the skeleton.  IMPRESSION: 1. Lung-RADS Category 4B, suspicious for malignancy. Additional imaging evaluation with PET-CT and/or consultation with pulmonary medicine or thoracic surgery is strongly recommended at this time. 2. Diffuse bronchial wall thickening with moderate centrilobular and paraseptal emphysema, most pronounced throughout the lung apices bilaterally; imaging findings suggestive of underlying COPD. 3.  Atherosclerosis, including left anterior descending coronary artery disease. Please note that although the presence of coronary artery calcium documents the presence of coronary artery disease, the severity of this disease and any potential stenosis cannot be assessed on this non-gated CT examination. Assessment for potential risk factor modification, dietary therapy or pharmacologic therapy may be warranted, if clinically indicated. 4. 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 These results were called by telephone at the time of interpretation on 10/24/2013 at 12:39 PM to Dr. [name], who verbally acknowledged these results.   Electronically Signed   By: Vinnie Langton M.D.   On: 10/24/2013 12:41      Recent Lab Findings: Lab Results  Component Value Date   WBC 7.3 12/07/2013   HGB 15.5 12/07/2013   HCT 44.9 12/07/2013   PLT 205 12/07/2013   GLUCOSE 113 12/07/2013   CHOL 198 10/23/2013   TRIG 76 10/23/2013   HDL 54 10/23/2013   LDLCALC 129* 10/23/2013   ALT 27 12/07/2013   AST 24 12/07/2013   NA 137 12/07/2013   K 4.5 12/07/2013   CL 105 10/23/2013   CREATININE 0.9 12/07/2013   BUN 10.8 12/07/2013   CO2 23 12/07/2013   INR  0.94 11/28/2013   FEV! 3.28  101%  DLCO 21.26  65%   Assessment / Plan:   Bx shows small cell Needs porta cath  The goals risks and alternatives of the planned surgical procedure portacath have been discussed with the patient in detail. The risks of the procedure including death, infection, stroke, myocardial infarction, bleeding, blood transfusion have all been discussed specifically.  I have quoted Jaime Morris a 1% of perioperative mortality and a complication rate as high as 10 %. The patient's questions have been answered.Jaime Morris is willing  to proceed with the planned procedure.  Jaime Isaac MD      [address] ,[place]  [address] Office [phone]   Beeper [phone]  12/08/2013 6:50 AM

## 2013-12-08 NOTE — Progress Notes (Signed)
Faxed fmla form to [phone]

## 2013-12-08 NOTE — Anesthesia Postprocedure Evaluation (Signed)
  Anesthesia Post-op Note  Patient: Jaime Morris  Procedure(s) Performed: Procedure(s): INSERTION PORT-A-CATH (N/A)  Patient Location: PACU  Anesthesia Type:MAC  Level of Consciousness: awake  Airway and Oxygen Therapy: Patient Spontanous Breathing  Post-op Pain: mild  Post-op Assessment: Post-op Vital signs reviewed  Post-op Vital Signs: Reviewed  Last Vitals:  Filed Vitals:   12/08/13 0903  BP: 130/70  Pulse: 78  Temp: 36.6 C  Resp: 14    Complications: No apparent anesthesia complications

## 2013-12-08 NOTE — Brief Op Note (Signed)
      [address]       [place] [address]             [phone]      12/08/2013  9:02 AM  PATIENT:  Jaime Morris  53 y.o. male  PRE-OPERATIVE DIAGNOSIS:  lung cancer  POST-OPERATIVE DIAGNOSIS:  lung cancer  PROCEDURE:  Procedure(s): INSERTION PORT-A-CATH (N/A) with fluro and sonosite guidance   SURGEON:  Surgeon(s) and Role:    * Grace Isaac, MD - Primary    ANESTHESIA:   MAC  EBL:  Total I/O In: 700 [I.V.:700] Out: 25 [Blood:25]  BLOOD ADMINISTERED:none  DRAINS: none   LOCAL MEDICATIONS USED:  LIDOCAINE   SPECIMEN:  No Specimen  DISPOSITION OF SPECIMEN:  N/A  COUNTS:  YES  DICTATION: .Dragon Dictation  PLAN OF CARE: Discharge to home after PACU  PATIENT DISPOSITION:  PACU - hemodynamically stable.   Delay start of Pharmacological VTE agent (>24hrs) due to surgical blood loss or risk of bleeding: yes

## 2013-12-11 ENCOUNTER — Telehealth: Payer: Self-pay | Admitting: *Deleted

## 2013-12-11 ENCOUNTER — Telehealth: Payer: Self-pay | Admitting: Internal Medicine

## 2013-12-11 NOTE — Telephone Encounter (Signed)
s.w. pt and advised on June appt....pt ok and aware °

## 2013-12-11 NOTE — Telephone Encounter (Signed)
Called to check on pt after [place] 12/07/13.  Left vm message to call if needed

## 2013-12-11 NOTE — Telephone Encounter (Signed)
Pt left a vm on [facility]'s nurses line that his FMLA paperwork needs some corrections.  Forwarded msg to [employer] in medical mgmt.  SLJ

## 2013-12-12 ENCOUNTER — Other Ambulatory Visit: Payer: 59

## 2013-12-12 ENCOUNTER — Encounter (HOSPITAL_COMMUNITY): Payer: Self-pay | Admitting: Cardiothoracic Surgery

## 2013-12-12 ENCOUNTER — Encounter: Payer: Self-pay | Admitting: *Deleted

## 2013-12-12 NOTE — Op Note (Signed)
NAMEVINCENTE, Jaime Morris             ACCOUNT NO.:  [id number]  MEDICAL RECORD NO.:  [id number]  LOCATION:  [facility]                         FACILITY:  [facility]  PHYSICIAN:  [name], MD    DATE OF BIRTH:  06-May-1961  DATE OF PROCEDURE:  12/08/2013 DATE OF DISCHARGE:  12/08/2013                              OPERATIVE REPORT   PREOPERATIVE DIAGNOSIS:  Small-cell lung cancer.  POSTOPERATIVE DIAGNOSIS:  Small-cell lung cancer.  SURGICAL PROCEDURE:  Insertion of left subclavian vein Port-A-Cath with fluoro and ultrasound guidance.  SURGEON:  [name], M.D.  BRIEF HISTORY:  The patient is a 53 year old male who has been diagnosed with small-cell lung cancer and is to start chemo.  Because of limited vascular access, Port-A-Cath was requested by Medical Oncology.  Risks and options were discussed with the patient.  He agreed and signed informed consent.  DESCRIPTION OF PROCEDURE:  After appropriate time-out, the patient had been sedated.  The neck and chest were prepped with Betadine and draped in sterile manner.  Using the SonoSite ultrasound device, the left subclavian vein was easily identified.  A 1% lidocaine was infiltrated over the infraclavicular space and across the left anterior chest.  A 16- gauge needle was introduced into the left subclavian vein without difficulty.  With ultrasound guidance, a guidewire was passed into the superior vena cava.  Fluoroscopy was used for the guidance of this and confirmation of placement.  A subcutaneous pocket was created over the anterior chest wall, and a preattached 9.6-French purple port device was placed in the subcutaneous pocket and the catheter tunneled to the insertion site.  Peel-away sheath was introduced over the guidewire into good position.  The catheter was trimmed to the appropriate length, and after being flushed with heparinized saline, it was introduced to the superior vena cava under fluoroscopic guidance.   Peel-away sheath was removed.  After this, the catheter was able to be aspirated and flushed without difficulty.  A 3 mL of concentrated heparin solution was instilled into the port.  The port was secured to the chest wall with a single silk suture.  The incision sites were then closed with interrupted 0 Vicryl and a running 4-0 subcuticular stitch.  Dermabond was applied.  The patient tolerated the procedure without obvious complication.  At the completion of procedure, fluoroscopy showed no evidence of pneumothorax and good position of the catheter.  Blood loss was minimal.     [name], MD     EG/MEDQ  D:  12/12/2013  T:  12/12/2013  Job:  [id number]

## 2013-12-13 ENCOUNTER — Other Ambulatory Visit: Payer: 59

## 2013-12-18 ENCOUNTER — Other Ambulatory Visit (HOSPITAL_BASED_OUTPATIENT_CLINIC_OR_DEPARTMENT_OTHER): Payer: 59

## 2013-12-18 ENCOUNTER — Ambulatory Visit (HOSPITAL_BASED_OUTPATIENT_CLINIC_OR_DEPARTMENT_OTHER): Payer: 59

## 2013-12-18 ENCOUNTER — Ambulatory Visit: Payer: 59

## 2013-12-18 ENCOUNTER — Encounter: Payer: Self-pay | Admitting: Internal Medicine

## 2013-12-18 ENCOUNTER — Encounter: Payer: Self-pay | Admitting: *Deleted

## 2013-12-18 VITALS — BP 130/82 | HR 76 | Temp 98.1°F | Resp 18

## 2013-12-18 DIAGNOSIS — C3410 Malignant neoplasm of upper lobe, unspecified bronchus or lung: Secondary | ICD-10-CM

## 2013-12-18 DIAGNOSIS — C3491 Malignant neoplasm of unspecified part of right bronchus or lung: Secondary | ICD-10-CM

## 2013-12-18 DIAGNOSIS — Z5111 Encounter for antineoplastic chemotherapy: Secondary | ICD-10-CM

## 2013-12-18 LAB — CBC WITH DIFFERENTIAL/PLATELET
BASO%: 0.6 % (ref 0.0–2.0)
Basophils Absolute: 0.1 10*3/uL (ref 0.0–0.1)
EOS%: 2.3 % (ref 0.0–7.0)
Eosinophils Absolute: 0.2 10*3/uL (ref 0.0–0.5)
HCT: 45.9 % (ref 38.4–49.9)
HEMOGLOBIN: 15.4 g/dL (ref 13.0–17.1)
LYMPH%: 29.8 % (ref 14.0–49.0)
MCH: 29.4 pg (ref 27.2–33.4)
MCHC: 33.6 g/dL (ref 32.0–36.0)
MCV: 87.4 fL (ref 79.3–98.0)
MONO#: 0.7 10*3/uL (ref 0.1–0.9)
MONO%: 7.5 % (ref 0.0–14.0)
NEUT#: 5.3 10*3/uL (ref 1.5–6.5)
NEUT%: 59.8 % (ref 39.0–75.0)
Platelets: 257 10*3/uL (ref 140–400)
RBC: 5.25 10*6/uL (ref 4.20–5.82)
RDW: 13.8 % (ref 11.0–14.6)
WBC: 8.9 10*3/uL (ref 4.0–10.3)
lymph#: 2.6 10*3/uL (ref 0.9–3.3)

## 2013-12-18 LAB — COMPREHENSIVE METABOLIC PANEL (CC13)
ALT: 25 U/L (ref 0–55)
ANION GAP: 7 meq/L (ref 3–11)
AST: 21 U/L (ref 5–34)
Albumin: 3.6 g/dL (ref 3.5–5.0)
Alkaline Phosphatase: 94 U/L (ref 40–150)
BUN: 11.5 mg/dL (ref 7.0–26.0)
CALCIUM: 9.4 mg/dL (ref 8.4–10.4)
CHLORIDE: 107 meq/L (ref 98–109)
CO2: 21 meq/L — AB (ref 22–29)
Creatinine: 1 mg/dL (ref 0.7–1.3)
Glucose: 117 mg/dl (ref 70–140)
POTASSIUM: 4.3 meq/L (ref 3.5–5.1)
Sodium: 136 mEq/L (ref 136–145)
Total Bilirubin: 0.35 mg/dL (ref 0.20–1.20)
Total Protein: 7.3 g/dL (ref 6.4–8.3)

## 2013-12-18 MED ORDER — ONDANSETRON 16 MG/50ML IVPB (CHCC)
16.0000 mg | Freq: Once | INTRAVENOUS | Status: AC
  Administered 2013-12-18: 16 mg via INTRAVENOUS

## 2013-12-18 MED ORDER — CARBOPLATIN CHEMO INJECTION 600 MG/60ML
687.0000 mg | Freq: Once | INTRAVENOUS | Status: AC
  Administered 2013-12-18: 690 mg via INTRAVENOUS
  Filled 2013-12-18: qty 69

## 2013-12-18 MED ORDER — SODIUM CHLORIDE 0.9 % IV SOLN
100.0000 mg/m2 | Freq: Once | INTRAVENOUS | Status: AC
  Administered 2013-12-18: 210 mg via INTRAVENOUS
  Filled 2013-12-18: qty 10.5

## 2013-12-18 MED ORDER — HEPARIN SOD (PORK) LOCK FLUSH 100 UNIT/ML IV SOLN
500.0000 [IU] | Freq: Once | INTRAVENOUS | Status: AC | PRN
  Administered 2013-12-18: 500 [IU]
  Filled 2013-12-18: qty 5

## 2013-12-18 MED ORDER — SODIUM CHLORIDE 0.9 % IV SOLN
Freq: Once | INTRAVENOUS | Status: AC
  Administered 2013-12-18: 10:00:00 via INTRAVENOUS

## 2013-12-18 MED ORDER — DEXAMETHASONE SODIUM PHOSPHATE 20 MG/5ML IJ SOLN
20.0000 mg | Freq: Once | INTRAMUSCULAR | Status: AC
  Administered 2013-12-18: 20 mg via INTRAVENOUS

## 2013-12-18 MED ORDER — SODIUM CHLORIDE 0.9 % IJ SOLN
10.0000 mL | INTRAMUSCULAR | Status: DC | PRN
  Administered 2013-12-18: 10 mL
  Filled 2013-12-18: qty 10

## 2013-12-18 NOTE — [facility] Oncology Navigator Note (Unsigned)
Spoke with pt today at [facility].  He is receiving his first chemotherapy.  He stated he was a bit anxious getting started.  I listened as he explained.  He also needed assistance coordination [facility] appt.  I arranged for them to come see him in chemo room.  He was thankful.

## 2013-12-18 NOTE — Patient Instructions (Addendum)
[facility] Discharge Instructions for Patients Receiving Chemotherapy  Today you received the following chemotherapy agents: Carboplatin and Etoposide   To help prevent nausea and vomiting after your treatment, we encourage you to take your nausea medication as prescribed. You received Zofran and Decadron in the infusion room prior to chemotherapy. You may take Compazine every 6 hours as needed for nausea beginning now. It may make you drowsy.   If you develop nausea and vomiting that is not controlled by your nausea medication, call the clinic.   BELOW ARE SYMPTOMS THAT SHOULD BE REPORTED IMMEDIATELY:  *FEVER GREATER THAN 100.5 F  *CHILLS WITH OR WITHOUT FEVER  NAUSEA AND VOMITING THAT IS NOT CONTROLLED WITH YOUR NAUSEA MEDICATION  *UNUSUAL SHORTNESS OF BREATH  *UNUSUAL BRUISING OR BLEEDING  TENDERNESS IN MOUTH AND THROAT WITH OR WITHOUT PRESENCE OF ULCERS  *URINARY PROBLEMS  *BOWEL PROBLEMS  UNUSUAL RASH Items with * indicate a potential emergency and should be followed up as soon as possible.  Feel free to call the clinic you have any questions or concerns. The clinic phone number is [phone].   Carboplatin injection What is this medicine? CARBOPLATIN (KAR boe pla tin) is a chemotherapy drug. It targets fast dividing cells, like cancer cells, and causes these cells to die. This medicine is used to treat ovarian cancer and many other cancers. This medicine may be used for other purposes; ask your health care provider or pharmacist if you have questions. COMMON BRAND NAME(S): Paraplatin What should I tell my health care provider before I take this medicine? They need to know if you have any of these conditions: -blood disorders -hearing problems -kidney disease -recent or ongoing radiation therapy -an unusual or allergic reaction to carboplatin, cisplatin, other chemotherapy, other medicines, foods, dyes, or preservatives -pregnant or trying to get  pregnant -breast-feeding How should I use this medicine? This drug is usually given as an infusion into a vein. It is administered in a hospital or clinic by a specially trained health care professional. Talk to your pediatrician regarding the use of this medicine in children. Special care may be needed. Overdosage: If you think you have taken too much of this medicine contact a poison control center or emergency room at once. NOTE: This medicine is only for you. Do not share this medicine with others. What if I miss a dose? It is important not to miss a dose. Call your doctor or health care professional if you are unable to keep an appointment. What may interact with this medicine? -medicines for seizures -medicines to increase blood counts like filgrastim, pegfilgrastim, sargramostim -some antibiotics like amikacin, gentamicin, neomycin, streptomycin, tobramycin -vaccines Talk to your doctor or health care professional before taking any of these medicines: -acetaminophen -aspirin -ibuprofen -ketoprofen -naproxen This list may not describe all possible interactions. Give your health care provider a list of all the medicines, herbs, non-prescription drugs, or dietary supplements you use. Also tell them if you smoke, drink alcohol, or use illegal drugs. Some items may interact with your medicine. What should I watch for while using this medicine? Your condition will be monitored carefully while you are receiving this medicine. You will need important blood work done while you are taking this medicine. This drug may make you feel generally unwell. This is not uncommon, as chemotherapy can affect healthy cells as well as cancer cells. Report any side effects. Continue your course of treatment even though you feel ill unless your doctor tells you to  stop. In some cases, you may be given additional medicines to help with side effects. Follow all directions for their use. Call your doctor or  health care professional for advice if you get a fever, chills or sore throat, or other symptoms of a cold or flu. Do not treat yourself. This drug decreases your body's ability to fight infections. Try to avoid being around people who are sick. This medicine may increase your risk to bruise or bleed. Call your doctor or health care professional if you notice any unusual bleeding. Be careful brushing and flossing your teeth or using a toothpick because you may get an infection or bleed more easily. If you have any dental work done, tell your dentist you are receiving this medicine. Avoid taking products that contain aspirin, acetaminophen, ibuprofen, naproxen, or ketoprofen unless instructed by your doctor. These medicines may hide a fever. Do not become pregnant while taking this medicine. Women should inform their doctor if they wish to become pregnant or think they might be pregnant. There is a potential for serious side effects to an unborn child. Talk to your health care professional or pharmacist for more information. Do not breast-feed an infant while taking this medicine. What side effects may I notice from receiving this medicine? Side effects that you should report to your doctor or health care professional as soon as possible: -allergic reactions like skin rash, itching or hives, swelling of the face, lips, or tongue -signs of infection - fever or chills, cough, sore throat, pain or difficulty passing urine -signs of decreased platelets or bleeding - bruising, pinpoint red spots on the skin, black, tarry stools, nosebleeds -signs of decreased red blood cells - unusually weak or tired, fainting spells, lightheadedness -breathing problems -changes in hearing -changes in vision -chest pain -high blood pressure -low blood counts - This drug may decrease the number of white blood cells, red blood cells and platelets. You may be at increased risk for infections and bleeding. -nausea and  vomiting -pain, swelling, redness or irritation at the injection site -pain, tingling, numbness in the hands or feet -problems with balance, talking, walking -trouble passing urine or change in the amount of urine Side effects that usually do not require medical attention (report to your doctor or health care professional if they continue or are bothersome): -hair loss -loss of appetite -metallic taste in the mouth or changes in taste This list may not describe all possible side effects. Call your doctor for medical advice about side effects. You may report side effects to FDA at 1-800-FDA-1088. Where should I keep my medicine? This drug is given in a hospital or clinic and will not be stored at home. NOTE: This sheet is a summary. It may not cover all possible information. If you have questions about this medicine, talk to your doctor, pharmacist, or health care provider.  2015, [employer] Standard. (2007-09-06 14:38:05) Etoposide, VP-16 injection What is this medicine? ETOPOSIDE, VP-16 (e toe POE side) is a chemotherapy drug. It is used to treat testicular cancer, lung cancer, and other cancers. This medicine may be used for other purposes; ask your health care provider or pharmacist if you have questions. COMMON BRAND NAME(S): Etopophos, Toposar, VePesid What should I tell my health care provider before I take this medicine? They need to know if you have any of these conditions: -infection -kidney disease -low blood counts, like low white cell, platelet, or red cell counts -an unusual or allergic reaction to etoposide, other chemotherapeutic agents, other  medicines, foods, dyes, or preservatives -pregnant or trying to get pregnant -breast-feeding How should I use this medicine? This medicine is for infusion into a vein. It is administered in a hospital or clinic by a specially trained health care professional. Talk to your pediatrician regarding the use of this medicine in  children. Special care may be needed. Overdosage: If you think you have taken too much of this medicine contact a poison control center or emergency room at once. NOTE: This medicine is only for you. Do not share this medicine with others. What if I miss a dose? It is important not to miss your dose. Call your doctor or health care professional if you are unable to keep an appointment. What may interact with this medicine? -cyclosporine -medicines to increase blood counts like filgrastim, pegfilgrastim, sargramostim -vaccines This list may not describe all possible interactions. Give your health care provider a list of all the medicines, herbs, non-prescription drugs, or dietary supplements you use. Also tell them if you smoke, drink alcohol, or use illegal drugs. Some items may interact with your medicine. What should I watch for while using this medicine? Visit your doctor for checks on your progress. This drug may make you feel generally unwell. This is not uncommon, as chemotherapy can affect healthy cells as well as cancer cells. Report any side effects. Continue your course of treatment even though you feel ill unless your doctor tells you to stop. In some cases, you may be given additional medicines to help with side effects. Follow all directions for their use. Call your doctor or health care professional for advice if you get a fever, chills or sore throat, or other symptoms of a cold or flu. Do not treat yourself. This drug decreases your body's ability to fight infections. Try to avoid being around people who are sick. This medicine may increase your risk to bruise or bleed. Call your doctor or health care professional if you notice any unusual bleeding. Be careful brushing and flossing your teeth or using a toothpick because you may get an infection or bleed more easily. If you have any dental work done, tell your dentist you are receiving this medicine. Avoid taking products that contain  aspirin, acetaminophen, ibuprofen, naproxen, or ketoprofen unless instructed by your doctor. These medicines may hide a fever. Do not become pregnant while taking this medicine. Women should inform their doctor if they wish to become pregnant or think they might be pregnant. There is a potential for serious side effects to an unborn child. Talk to your health care professional or pharmacist for more information. Do not breast-feed an infant while taking this medicine. What side effects may I notice from receiving this medicine? Side effects that you should report to your doctor or health care professional as soon as possible: -allergic reactions like skin rash, itching or hives, swelling of the face, lips, or tongue -low blood counts - this medicine may decrease the number of white blood cells, red blood cells and platelets. You may be at increased risk for infections and bleeding. -signs of infection - fever or chills, cough, sore throat, pain or difficulty passing urine -signs of decreased platelets or bleeding - bruising, pinpoint red spots on the skin, black, tarry stools, blood in the urine -signs of decreased red blood cells - unusually weak or tired, fainting spells, lightheadedness -breathing problems -changes in vision -mouth or throat sores or ulcers -pain, redness, swelling or irritation at the injection site -pain, tingling, numbness in  the hands or feet -redness, blistering, peeling or loosening of the skin, including inside the mouth -seizures -vomiting Side effects that usually do not require medical attention (report to your doctor or health care professional if they continue or are bothersome): -diarrhea -hair loss -loss of appetite -nausea -stomach pain This list may not describe all possible side effects. Call your doctor for medical advice about side effects. You may report side effects to FDA at 1-800-FDA-1088. Where should I keep my medicine? This drug is given in a  hospital or clinic and will not be stored at home. NOTE: This sheet is a summary. It may not cover all possible information. If you have questions about this medicine, talk to your doctor, pharmacist, or health care provider.  2015, [employer] Standard. (2007-10-03 17:24:12)

## 2013-12-18 NOTE — Progress Notes (Signed)
Prior to starting carboplatin, patient c/o mild dizziness which is "not very bad." He reports no changes from his normal routine, taking only chantix this AM and eating and drinking well. VSS. Patient only received zofran and decadron as premeds. He is anxious for his first chemo. Dr. Julien Nordmann notified of the above. OK to begin chemotherapy. Monitor closely.

## 2013-12-18 NOTE — Addendum Note (Signed)
Addendum created 12/18/13 1411 by Finis Bud, MD   Modules edited: Anesthesia Attestations

## 2013-12-18 NOTE — Progress Notes (Signed)
Checked in new pt with no financial concerns at this time.  I made him aware of the copay assistance program that's available if needed and gave him Raquel's card for any questions or concerns.

## 2013-12-19 ENCOUNTER — Ambulatory Visit (HOSPITAL_BASED_OUTPATIENT_CLINIC_OR_DEPARTMENT_OTHER): Payer: 59

## 2013-12-19 ENCOUNTER — Encounter: Payer: Self-pay | Admitting: *Deleted

## 2013-12-19 VITALS — BP 138/4 | HR 89 | Temp 97.7°F | Resp 18

## 2013-12-19 DIAGNOSIS — C3491 Malignant neoplasm of unspecified part of right bronchus or lung: Secondary | ICD-10-CM

## 2013-12-19 DIAGNOSIS — C3410 Malignant neoplasm of upper lobe, unspecified bronchus or lung: Secondary | ICD-10-CM

## 2013-12-19 DIAGNOSIS — Z5111 Encounter for antineoplastic chemotherapy: Secondary | ICD-10-CM

## 2013-12-19 MED ORDER — PROCHLORPERAZINE MALEATE 10 MG PO TABS
10.0000 mg | ORAL_TABLET | Freq: Once | ORAL | Status: AC
  Administered 2013-12-19: 10 mg via ORAL

## 2013-12-19 MED ORDER — HEPARIN SOD (PORK) LOCK FLUSH 100 UNIT/ML IV SOLN
500.0000 [IU] | Freq: Once | INTRAVENOUS | Status: AC | PRN
  Administered 2013-12-19: 500 [IU]
  Filled 2013-12-19: qty 5

## 2013-12-19 MED ORDER — PROCHLORPERAZINE MALEATE 10 MG PO TABS
ORAL_TABLET | ORAL | Status: AC
  Filled 2013-12-19: qty 1

## 2013-12-19 MED ORDER — SODIUM CHLORIDE 0.9 % IV SOLN
Freq: Once | INTRAVENOUS | Status: AC
  Administered 2013-12-19: 09:00:00 via INTRAVENOUS

## 2013-12-19 MED ORDER — SODIUM CHLORIDE 0.9 % IJ SOLN
10.0000 mL | INTRAMUSCULAR | Status: DC | PRN
  Administered 2013-12-19: 10 mL
  Filled 2013-12-19: qty 10

## 2013-12-19 MED ORDER — SODIUM CHLORIDE 0.9 % IV SOLN
100.0000 mg/m2 | Freq: Once | INTRAVENOUS | Status: AC
  Administered 2013-12-19: 210 mg via INTRAVENOUS
  Filled 2013-12-19: qty 10.5

## 2013-12-19 NOTE — Progress Notes (Signed)
[facility] Social Work  Clinical Social Work was referred by MD to review and complete healthcare advance directives.  Clinical Social Worker met with patient and pateint's friend in infusion room.  The patient designated brother Jaime Morris as their primary healthcare agent and sisters Jaime Morris and Jaime Morris as their secondary agents.  Patient also completed healthcare living will.    Clinical Social Worker notarized documents and made copies for patient/family. Clinical Social Worker will send documents to medical records to be scanned into patient's chart. Clinical Social Worker encouraged patient/family to contact with any additional questions or concerns.  [name], MSW, [facility] [phone]

## 2013-12-19 NOTE — Patient Instructions (Signed)
[facility] Discharge Instructions for Patients Receiving Chemotherapy  Today you received the following chemotherapy agents Etoposide.  To help prevent nausea and vomiting after your treatment, we encourage you to take your nausea medication.   If you develop nausea and vomiting that is not controlled by your nausea medication, call the clinic.   BELOW ARE SYMPTOMS THAT SHOULD BE REPORTED IMMEDIATELY:  *FEVER GREATER THAN 100.5 F  *CHILLS WITH OR WITHOUT FEVER  NAUSEA AND VOMITING THAT IS NOT CONTROLLED WITH YOUR NAUSEA MEDICATION  *UNUSUAL SHORTNESS OF BREATH  *UNUSUAL BRUISING OR BLEEDING  TENDERNESS IN MOUTH AND THROAT WITH OR WITHOUT PRESENCE OF ULCERS  *URINARY PROBLEMS  *BOWEL PROBLEMS  UNUSUAL RASH Items with * indicate a potential emergency and should be followed up as soon as possible.  Feel free to call the clinic you have any questions or concerns. The clinic phone number is [phone].

## 2013-12-20 ENCOUNTER — Ambulatory Visit (HOSPITAL_BASED_OUTPATIENT_CLINIC_OR_DEPARTMENT_OTHER): Payer: 59

## 2013-12-20 VITALS — BP 124/81 | HR 79 | Temp 97.7°F

## 2013-12-20 DIAGNOSIS — C3491 Malignant neoplasm of unspecified part of right bronchus or lung: Secondary | ICD-10-CM

## 2013-12-20 DIAGNOSIS — C3410 Malignant neoplasm of upper lobe, unspecified bronchus or lung: Secondary | ICD-10-CM

## 2013-12-20 DIAGNOSIS — Z5111 Encounter for antineoplastic chemotherapy: Secondary | ICD-10-CM

## 2013-12-20 MED ORDER — PROCHLORPERAZINE MALEATE 10 MG PO TABS
10.0000 mg | ORAL_TABLET | Freq: Once | ORAL | Status: AC
  Administered 2013-12-20: 10 mg via ORAL

## 2013-12-20 MED ORDER — HEPARIN SOD (PORK) LOCK FLUSH 100 UNIT/ML IV SOLN
500.0000 [IU] | Freq: Once | INTRAVENOUS | Status: AC | PRN
  Administered 2013-12-20: 500 [IU]
  Filled 2013-12-20: qty 5

## 2013-12-20 MED ORDER — SODIUM CHLORIDE 0.9 % IV SOLN
100.0000 mg/m2 | Freq: Once | INTRAVENOUS | Status: AC
  Administered 2013-12-20: 210 mg via INTRAVENOUS
  Filled 2013-12-20: qty 10.5

## 2013-12-20 MED ORDER — SODIUM CHLORIDE 0.9 % IJ SOLN
10.0000 mL | INTRAMUSCULAR | Status: DC | PRN
  Administered 2013-12-20: 10 mL
  Filled 2013-12-20: qty 10

## 2013-12-20 MED ORDER — PROCHLORPERAZINE MALEATE 10 MG PO TABS
ORAL_TABLET | ORAL | Status: AC
  Filled 2013-12-20: qty 1

## 2013-12-20 MED ORDER — SODIUM CHLORIDE 0.9 % IV SOLN
Freq: Once | INTRAVENOUS | Status: AC
  Administered 2013-12-20: 09:00:00 via INTRAVENOUS

## 2013-12-20 NOTE — Patient Instructions (Signed)
[facility] Discharge Instructions for Patients Receiving Chemotherapy  Today you received the following chemotherapy agents Etoposide (VP 16) To help prevent nausea and vomiting after your treatment, we encourage you to take your nausea medication as prescribed.  If you develop nausea and vomiting that is not controlled by your nausea medication, call the clinic.   BELOW ARE SYMPTOMS THAT SHOULD BE REPORTED IMMEDIATELY:  *FEVER GREATER THAN 100.5 F  *CHILLS WITH OR WITHOUT FEVER  NAUSEA AND VOMITING THAT IS NOT CONTROLLED WITH YOUR NAUSEA MEDICATION  *UNUSUAL SHORTNESS OF BREATH  *UNUSUAL BRUISING OR BLEEDING  TENDERNESS IN MOUTH AND THROAT WITH OR WITHOUT PRESENCE OF ULCERS  *URINARY PROBLEMS  *BOWEL PROBLEMS  UNUSUAL RASH Items with * indicate a potential emergency and should be followed up as soon as possible.  Feel free to call the clinic you have any questions or concerns. The clinic phone number is [phone].

## 2013-12-21 ENCOUNTER — Telehealth: Payer: Self-pay | Admitting: *Deleted

## 2013-12-21 ENCOUNTER — Ambulatory Visit (HOSPITAL_BASED_OUTPATIENT_CLINIC_OR_DEPARTMENT_OTHER): Payer: 59

## 2013-12-21 VITALS — BP 144/86 | HR 90 | Temp 99.2°F

## 2013-12-21 DIAGNOSIS — Z5189 Encounter for other specified aftercare: Secondary | ICD-10-CM

## 2013-12-21 DIAGNOSIS — C3410 Malignant neoplasm of upper lobe, unspecified bronchus or lung: Secondary | ICD-10-CM

## 2013-12-21 DIAGNOSIS — C3491 Malignant neoplasm of unspecified part of right bronchus or lung: Secondary | ICD-10-CM

## 2013-12-21 MED ORDER — PEGFILGRASTIM INJECTION 6 MG/0.6ML
6.0000 mg | Freq: Once | SUBCUTANEOUS | Status: AC
  Administered 2013-12-21: 6 mg via SUBCUTANEOUS
  Filled 2013-12-21: qty 0.6

## 2013-12-21 NOTE — Telephone Encounter (Signed)
Brayten here for Neulasta injection following 1st carbo/vp chemo treatment.  States that he is a little tired, but otherwise doing well.  No nausea, vomiting, or diarrhea.  Has not had a bowel movement yesterday or today, and he usually goes every day.  Is getting a little bloated.  Suggested that he get a stool softener or some Miralax for this.  Told him not to let this go more than 2-3 days.  Is drinking his fluids and eating well.  All questions answered.  Knows to call if he has any problems or concerns.

## 2013-12-21 NOTE — Patient Instructions (Signed)

## 2013-12-25 ENCOUNTER — Other Ambulatory Visit (HOSPITAL_BASED_OUTPATIENT_CLINIC_OR_DEPARTMENT_OTHER): Payer: 59

## 2013-12-25 ENCOUNTER — Encounter: Payer: Self-pay | Admitting: Physician Assistant

## 2013-12-25 ENCOUNTER — Ambulatory Visit (HOSPITAL_BASED_OUTPATIENT_CLINIC_OR_DEPARTMENT_OTHER): Payer: 59 | Admitting: Physician Assistant

## 2013-12-25 VITALS — BP 121/78 | HR 78 | Temp 98.6°F | Resp 18 | Ht 70.0 in | Wt 193.1 lb

## 2013-12-25 DIAGNOSIS — C3491 Malignant neoplasm of unspecified part of right bronchus or lung: Secondary | ICD-10-CM

## 2013-12-25 DIAGNOSIS — R5383 Other fatigue: Secondary | ICD-10-CM

## 2013-12-25 DIAGNOSIS — C3410 Malignant neoplasm of upper lobe, unspecified bronchus or lung: Secondary | ICD-10-CM

## 2013-12-25 DIAGNOSIS — C3490 Malignant neoplasm of unspecified part of unspecified bronchus or lung: Secondary | ICD-10-CM

## 2013-12-25 DIAGNOSIS — K5900 Constipation, unspecified: Secondary | ICD-10-CM

## 2013-12-25 DIAGNOSIS — R5381 Other malaise: Secondary | ICD-10-CM

## 2013-12-25 LAB — COMPREHENSIVE METABOLIC PANEL (CC13)
ALT: 28 U/L (ref 0–55)
ANION GAP: 7 meq/L (ref 3–11)
AST: 19 U/L (ref 5–34)
Albumin: 3.5 g/dL (ref 3.5–5.0)
Alkaline Phosphatase: 123 U/L (ref 40–150)
BUN: 12.9 mg/dL (ref 7.0–26.0)
CO2: 22 mEq/L (ref 22–29)
CREATININE: 0.9 mg/dL (ref 0.7–1.3)
Calcium: 9 mg/dL (ref 8.4–10.4)
Chloride: 109 mEq/L (ref 98–109)
Glucose: 109 mg/dl (ref 70–140)
Potassium: 4.4 mEq/L (ref 3.5–5.1)
Sodium: 138 mEq/L (ref 136–145)
Total Bilirubin: 0.22 mg/dL (ref 0.20–1.20)
Total Protein: 6.8 g/dL (ref 6.4–8.3)

## 2013-12-25 LAB — CBC WITH DIFFERENTIAL/PLATELET
BASO%: 0.3 % (ref 0.0–2.0)
Basophils Absolute: 0 10*3/uL (ref 0.0–0.1)
EOS%: 0.7 % (ref 0.0–7.0)
Eosinophils Absolute: 0.1 10*3/uL (ref 0.0–0.5)
HCT: 40.2 % (ref 38.4–49.9)
HEMOGLOBIN: 13.6 g/dL (ref 13.0–17.1)
LYMPH%: 16.8 % (ref 14.0–49.0)
MCH: 29.6 pg (ref 27.2–33.4)
MCHC: 33.8 g/dL (ref 32.0–36.0)
MCV: 87.4 fL (ref 79.3–98.0)
MONO#: 0.6 10*3/uL (ref 0.1–0.9)
MONO%: 4.5 % (ref 0.0–14.0)
NEUT#: 10.4 10*3/uL — ABNORMAL HIGH (ref 1.5–6.5)
NEUT%: 77.7 % — ABNORMAL HIGH (ref 39.0–75.0)
PLATELETS: 187 10*3/uL (ref 140–400)
RBC: 4.6 10*6/uL (ref 4.20–5.82)
RDW: 13.5 % (ref 11.0–14.6)
WBC: 13.4 10*3/uL — ABNORMAL HIGH (ref 4.0–10.3)
lymph#: 2.3 10*3/uL (ref 0.9–3.3)

## 2013-12-25 NOTE — Patient Instructions (Signed)
Continue weekly labs as scheduled Follow up in 2 weeks, prior to your next scheduled cycle of chemotherapy

## 2013-12-25 NOTE — Progress Notes (Addendum)
No images are attached to the encounter. No scans are attached to the encounter. No scans are attached to the encounter. [place] NOTE  Irven Shelling, MD [employer], [address] [place] [address]  DIAGNOSIS: Small cell carcinoma of lung, Extensive stage   Primary site: Lung (Right)   Staging method: AJCC 7th Edition   Clinical: Stage IV (T1a, N1, M1b) signed by Curt Bears, MD on 12/07/2013  3:19 PM   Summary: Stage IV (T1a, N1, M1b)  PRIOR THERAPY: none  CURRENT THERAPY: Systemic chemotherapy with carboplatin for AUC of 5 given on day 1, etoposide 100 mg/m2 given on days 1, 2 an3 with neulast support on day 4. Status post 1 cycle  DISEASE STAGE: Small cell carcinoma of lung, Extensive stage   Primary site: Lung (Right)   Staging method: AJCC 7th Edition   Clinical: Stage IV (T1a, N1, M1b) signed by Curt Bears, MD on 12/07/2013  3:19 PM   Summary: Stage IV (T1a, N1, M1b)  CHEMOTHERAPY INTENT: pallative  CURRENT # OF CHEMOTHERAPY CYCLES: 1  CURRENT ANTIEMETICS: Zofran, dexamethasone and compazine  CURRENT SMOKING STATUS: Former smoker, quit 12/18/2013  ORAL CHEMOTHERAPY AND CONSENT: n/a  CURRENT BISPHOSPHONATES USE: none  PAIN MANAGEMENT: Lortab 5/325 mg  NARCOTICS INDUCED CONSTIPATION: none  LIVING WILL AND CODE STATUS: ?   INTERVAL HISTORY: Jaime Morris 53 y.o. male returns for a scheduled regular symptom management visit for followup of recently diagnosed  Extensive stage small cell lung cancer. He is status post 1 cycle of systemic chemotherapy with carboplatin and etoposide with Neulasta support. He tolerated his first cycle of chemotherapy relatively well with the exception of fatigue and brief constipation. He denied fever, chills, cough, hemoptysis , shortness of breath, night sweats or significant weight loss.   MEDICAL HISTORY: Past Medical History  Diagnosis Date  . HIV infection     DR.  [name]  . Hyperlipidemia   . Insomnia   . Lung nodule, multiple 10/24/13    CT CHEST W/O...DR. [name]  . Gynecomastia, male 10/16/13    MILD RIGHT SIDED PER RADIOLOGY STUDY  . COPD (chronic obstructive pulmonary disease)     PFT'S 10/26/13 @ [place]  . History of depression   . History of pneumonia   . Small cell lung cancer   . Collapsed lung     d/t biopsy, right    ALLERGIES:  has No Known Allergies.  MEDICATIONS:  Current Outpatient Prescriptions  Medication Sig Dispense Refill  . albuterol (PROAIR HFA) 108 (90 BASE) MCG/ACT inhaler Inhale 2 puffs into the lungs every 4 (four) hours as needed for wheezing or shortness of breath.       . doxylamine, Sleep, (UNISOM) 25 MG tablet Take 50 mg by mouth at bedtime as needed for sleep.       Marland Kitchen efavirenz-emtricitabine-tenofovir (ATRIPLA) 600-200-300 MG per tablet Take 1 tablet by mouth daily.  90 tablet  3  . fluticasone (FLONASE) 50 MCG/ACT nasal spray Place 2 sprays into both nostrils daily as needed for allergies.       Marland Kitchen lidocaine-prilocaine (EMLA) cream Apply 1 application topically as needed. Apply to port 1 hour before chemo appointment.  30 g  0  . Melatonin 10 MG TABS Take 10 mg by mouth at bedtime as needed (sleep).      Marland Kitchen OVER THE COUNTER MEDICATION daily. Essential Oils      . traZODone (DESYREL) 100 MG tablet Take 100  mg by mouth at bedtime.      . varenicline (CHANTIX) 1 MG tablet Take 1 mg by mouth 2 (two) times daily.       Marland Kitchen HYDROcodone-acetaminophen (LORTAB) 5-325 MG per tablet Take 1-2 tablets by mouth every 6 (six) hours as needed for moderate pain.  30 tablet  0  . prochlorperazine (COMPAZINE) 10 MG tablet Take 1 tablet (10 mg total) by mouth every 6 (six) hours as needed for nausea or vomiting.  30 tablet  1   No current facility-administered medications for this visit.    SURGICAL HISTORY:  Past Surgical History  Procedure Laterality Date  . Foot surgery      BUNIONECTOMY  . Back surgery      L 4-5  FUSION WITH SCREW  . Colonoscopy  UTD  . Portacath placement N/A 12/08/2013    Procedure: INSERTION PORT-A-CATH;  Surgeon: Grace Isaac, MD;  Location: [facility] OR;  Service: Thoracic;  Laterality: N/A;    REVIEW OF SYSTEMS:  Constitutional: positive for fatigue Eyes: negative Ears, nose, mouth, throat, and face: negative Respiratory: negative Cardiovascular: negative Gastrointestinal: positive for constipation Genitourinary:negative Integument/breast: negative Hematologic/lymphatic: negative Musculoskeletal:negative Neurological: negative Behavioral/Psych: negative Endocrine: negative Allergic/Immunologic: negative   PHYSICAL EXAMINATION: General appearance: alert, cooperative, appears stated age and no distress Head: Normocephalic, without obvious abnormality, atraumatic Neck: no adenopathy, no carotid bruit, no JVD, supple, symmetrical, trachea midline and thyroid not enlarged, symmetric, no tenderness/mass/nodules Lymph nodes: Cervical, supraclavicular, and axillary nodes normal. Resp: clear to auscultation bilaterally Back: symmetric, no curvature. ROM normal. No CVA tenderness. Cardio: regular rate and rhythm, S1, S2 normal, no murmur, click, rub or gallop GI: soft, non-tender; bowel sounds normal; no masses,  no organomegaly Extremities: extremities normal, atraumatic, no cyanosis or edema Neurologic: Alert and oriented X 3, normal strength and tone. Normal symmetric reflexes. Normal coordination and gait  ECOG PERFORMANCE STATUS: 1 - Symptomatic but completely ambulatory  Blood pressure 121/78, pulse 78, temperature 98.6 F (37 C), temperature source Oral, resp. rate 18, height 5\' 10"  (1.778 m), weight 193 lb 1.6 oz (87.59 kg), SpO2 98.00%.  LABORATORY DATA: Lab Results  Component Value Date   WBC 13.4* 12/25/2013   HGB 13.6 12/25/2013   HCT 40.2 12/25/2013   MCV 87.4 12/25/2013   PLT 187 12/25/2013      Chemistry      Component Value Date/Time   NA 138 12/25/2013  1003   NA 137 10/23/2013 1112   K 4.4 12/25/2013 1003   K 4.9 10/23/2013 1112   CL 105 10/23/2013 1112   CO2 22 12/25/2013 1003   CO2 24 10/23/2013 1112   BUN 12.9 12/25/2013 1003   BUN 10 10/23/2013 1112   CREATININE 0.9 12/25/2013 1003   CREATININE 0.79 10/23/2013 1112   CREATININE 0.84 07/21/2010 2105      Component Value Date/Time   CALCIUM 9.0 12/25/2013 1003   CALCIUM 9.5 10/23/2013 1112   ALKPHOS 123 12/25/2013 1003   ALKPHOS 90 10/23/2013 1112   AST 19 12/25/2013 1003   AST 18 10/23/2013 1112   ALT 28 12/25/2013 1003   ALT 21 10/23/2013 1112   BILITOT 0.22 12/25/2013 1003   BILITOT 0.4 10/23/2013 1112       RADIOGRAPHIC STUDIES:  Dg Chest 2 View  12/08/2013   CLINICAL DATA:  Postop port placement.  EXAM: CHEST  2 VIEW  COMPARISON:  12/08/2013 and 11/30/2013 as well as chest CT 10/24/2013  FINDINGS: Left subclavian Port-A-Cath has tip within the  SVC at the level of the carina. No evidence of pneumothorax. Lungs are adequately inflated with stable emphysematous disease. Known nodule over the right upper lobe slightly smaller likely due to recent biopsy. Cardiomediastinal silhouette and remainder the exam is unchanged.  IMPRESSION: Stable emphysematous disease. Known right upper lobe nodule slightly smaller.  Left subclavian Port-A-Cath with tip within the SVC. No pneumothorax.   Electronically Signed   By: Marin Olp M.D.   On: 12/08/2013 10:52   Dg Chest 2 View Within Previous 72 Hours.  Films Obtained On Friday Are Acceptable For Monday And Tuesday Cases  12/08/2013   CLINICAL DATA:  History of lung cancer. Recent chest tube placement and removal.  EXAM: CHEST  2 VIEW  COMPARISON:  11/30/2013 and multiple previous  FINDINGS: No pneumothorax on the right. Widespread changes of emphysema persist. 1.5 cm mass density posteriorly in the right upper lobe remains evident. No effusions. No acute bony finding.  IMPRESSION: No residual or recurrent pneumothorax on the right. Continued visualization of  the posterior right upper lobe lung mass. Widespread changes of emphysema.   Electronically Signed   By: Nelson Chimes M.D.   On: 12/08/2013 07:14   Ct Biopsy  11/28/2013   CLINICAL DATA:  53 year old male with HIV and long history of smoking. A pulmonary nodule is identified on screening chest CT. Subsequent PET-CT demonstrates positive hypermetabolic activity concerning for primary bronchogenic carcinoma. CT-guided biopsy is warranted to facilitate tissue diagnosis and a treatment planning. Given the surrounding emphysema, the patient is very high risk for pneumothorax. He understands and wishes to proceed.  EXAM: CT BIOPSY; CT PERC PLEURAL DRAIN W/INDWELL CATH W/IMG GUIDE  Date: 11/28/2013  PROCEDURE: 1. CT-guided core biopsy of right upper lobe pulmonary nodule 2. Placement of a 10 French percutaneous thoracostomy tube Interventional Radiologist:  Criselda Peaches, MD  ANESTHESIA/SEDATION: Moderate (conscious) sedation was used. Five mg Versed, 200 mcg Fentanyl were administered intravenously. The patient's vital signs were monitored continuously by radiology nursing throughout the procedure.  Sedation Time: 56 minutes  TECHNIQUE: Informed consent was obtained from the patient following explanation of the procedure, risks, benefits and alternatives. The patient understands, agrees and consents for the procedure. All questions were addressed. A time out was performed.  A planning axial CT scan was performed. The right upper lobe pulmonary nodule was identified. A suitable skin entry site was selected and marked. Local anesthesia was attained by infiltration with 1% lidocaine. Under intermittent CT fluoroscopic guidance, a 17 gauge trocar needle was carefully advanced through the right posterolateral chest wall and into the inferior aspect of the nodule. A total of two 18 gauge core biopsies were then coaxially obtained while directing the needle cephalad into the central mass of the nodule. Biopsy specimens  were placed in formalin and sent to pathology for further analysis.  10 mL of peripheral blood was then aspirated from the patient's IV in used as a parenchymal blood patch while removing the outer trocar needle. A post biopsy axial CT scan demonstrates a small immediate postprocedural pneumothorax. Given the patient's relatively high risk of progressive pneumothorax he was repositioned on no is back. After a 10 min delay a repeat CT scan was performed. The pneumothorax was rapidly expanding. Therefore, the decision was made to proceed with percutaneous thoracostomy tube placement.  The CT images were used to select a skin entry site which was marked. The right anterolateral chest wall was then sterilely prepped and draped in the usual fashion. Local anesthesia was  attained by infiltration with 1% lidocaine. Using routine trocar technique, a Cook 10 [place] multipurpose drain was advanced into the pleural space and directed apically over the lung. The locking loop was formed and the chest tube was connected to wall suction via a Pleur-Evac at -20 cm water. Followup axial CT imaging demonstrates near-total reinflation of the lung with only minimal residual basilar pneumothorax. The thoracostomy tube was secured to the skin with 0 Prolene suture and a sterile bandage was placed.  The patient tolerated the procedure very well.  COMPLICATIONS: Postprocedural pneumothorax requiring chest tube placement.  IMPRESSION: 1. Technically successful CT-guided lung biopsy of right upper lobe pulmonary nodule. 2. Placement of a 10 French percutaneous thoracostomy tube for treatment of immediate enlarging postprocedural pneumothorax. The patient remained hemodynamically stable and clinically asymptomatic throughout the procedure. He will be admitted for observation and management of the chest tube. Signed,  Criselda Peaches, MD  [facility]  [facility]   Electronically Signed   By:  Jacqulynn Cadet M.D.   On: 11/28/2013 17:38   Dg Chest Port 1 View  11/30/2013   CLINICAL DATA:  Status post chest tube removal.  EXAM: PORTABLE CHEST - 1 VIEW  COMPARISON:  11/30/2013  FINDINGS: Since prior study, the right sided chest tube has been removed. There is no evidence of a pneumothorax.  There is persistent lung base opacity, likely atelectasis. Right upper lobe nodule described on the earlier study is unchanged. Lungs are hyperexpanded but otherwise clear. Cardiac silhouette is normal in size. Normal mediastinal and hilar contours.  IMPRESSION: No pneumothorax following right-sided chest tube removal. No other change from the prior study.   Electronically Signed   By: Lajean Manes M.D.   On: 11/30/2013 14:18   Dg Chest Port 1 View  11/30/2013   CLINICAL DATA:  Chest tube clamping.  EXAM: PORTABLE CHEST - 1 VIEW  COMPARISON:  Earlier same date and 11/29/2013.  FINDINGS: 1204 hr. Pigtail catheter on the right pleural space appears unchanged. No recurrent pneumothorax identified. The heart size and mediastinal contours are stable. The lungs are hyperinflated with a stable right upper lobe nodule.  IMPRESSION: No evidence of recurrent pneumothorax.   Electronically Signed   By: Camie Patience M.D.   On: 11/30/2013 12:19   Dg Chest Port 1 View  11/30/2013   CLINICAL DATA:  Chest tube.  EXAM: PORTABLE CHEST - 1 VIEW  COMPARISON:  Chest x-ray 11/29/2013.  Chest CT 10/24/2013.  FINDINGS: Right chest tube in stable position. Mediastinum and hilar structures are unremarkable. Stable cardiomegaly. Normal pulmonary vascularity. Persistent pulmonary nodule right upper lung. Mild basilar atelectasis. No significant pneumothorax noted. No acute bony abnormality.  IMPRESSION: 1. Right chest tube in stable position. No evidence of pneumothorax. 2. Pulmonary nodule right upper lung unchanged. 3. Bibasilar atelectasis. 4. Stable cardiomegaly.   Electronically Signed   By: Marcello Moores  Register   On: 11/30/2013 08:11    Portable Chest 1 View  11/29/2013   CLINICAL DATA:  Chest tube.  Evaluate pneumothorax.  EXAM: PORTABLE CHEST - 1 VIEW  COMPARISON:  11/28/2013  FINDINGS: No residual pneumothorax. Mild medial lung base opacity is stable likely atelectasis. Lungs are hyperexpanded but otherwise clear. Right-sided chest tube is unchanged.  Cardiopericardial silhouette is normal in size. Normal mediastinal and hilar contours.  IMPRESSION: Stable chest tube. No residual pneumothorax. Mild stable lung base atelectasis. No change from the previous exam allowing for differences in technique.   Electronically Signed  By: Lajean Manes M.D.   On: 11/29/2013 09:35   Dg Chest Port 1 View  11/28/2013   CLINICAL DATA:  Right-sided chest tube placement for pneumothorax status post lung biopsy.  EXAM: PORTABLE CHEST - 1 VIEW  COMPARISON:  Images from CT-guided lung biopsy earlier today. Chest CT 10/24/2013. Chest radiographs 03/15/2013.  FINDINGS: The cardiomediastinal silhouette is within normal limits. A right-sided pigtail catheter overlies the right upper lung. No pneumothorax is identified. Emphysematous changes are noted in the lungs. Patchy opacities are present in both lung bases. Known right upper lobe lung nodule is not well seen. No large pleural effusion is identified. No acute osseous abnormality is identified.  IMPRESSION: 1. Right chest tube in place.  No pneumothorax identified. 2. Bibasilar lung opacities, likely atelectasis.   Electronically Signed   By: Logan Bores   On: 11/28/2013 12:06   Dg Fluoro Guide Cv Line-no Report  12/08/2013   CLINICAL DATA: Surgery   FLOURO GUIDE CV LINE  Fluoroscopy was utilized by the requesting physician.  No radiographic  interpretation.    Ct Perc Pleural Drain W/indwell Cath W/img Guide  11/28/2013   CLINICAL DATA:  53 year old male with HIV and long history of smoking. A pulmonary nodule is identified on screening chest CT. Subsequent PET-CT demonstrates positive hypermetabolic  activity concerning for primary bronchogenic carcinoma. CT-guided biopsy is warranted to facilitate tissue diagnosis and a treatment planning. Given the surrounding emphysema, the patient is very high risk for pneumothorax. He understands and wishes to proceed.  EXAM: CT BIOPSY; CT PERC PLEURAL DRAIN W/INDWELL CATH W/IMG GUIDE  Date: 11/28/2013  PROCEDURE: 1. CT-guided core biopsy of right upper lobe pulmonary nodule 2. Placement of a 10 French percutaneous thoracostomy tube Interventional Radiologist:  Criselda Peaches, MD  ANESTHESIA/SEDATION: Moderate (conscious) sedation was used. Five mg Versed, 200 mcg Fentanyl were administered intravenously. The patient's vital signs were monitored continuously by radiology nursing throughout the procedure.  Sedation Time: 56 minutes  TECHNIQUE: Informed consent was obtained from the patient following explanation of the procedure, risks, benefits and alternatives. The patient understands, agrees and consents for the procedure. All questions were addressed. A time out was performed.  A planning axial CT scan was performed. The right upper lobe pulmonary nodule was identified. A suitable skin entry site was selected and marked. Local anesthesia was attained by infiltration with 1% lidocaine. Under intermittent CT fluoroscopic guidance, a 17 gauge trocar needle was carefully advanced through the right posterolateral chest wall and into the inferior aspect of the nodule. A total of two 18 gauge core biopsies were then coaxially obtained while directing the needle cephalad into the central mass of the nodule. Biopsy specimens were placed in formalin and sent to pathology for further analysis.  10 mL of peripheral blood was then aspirated from the patient's IV in used as a parenchymal blood patch while removing the outer trocar needle. A post biopsy axial CT scan demonstrates a small immediate postprocedural pneumothorax. Given the patient's relatively high risk of progressive  pneumothorax he was repositioned on no is back. After a 10 min delay a repeat CT scan was performed. The pneumothorax was rapidly expanding. Therefore, the decision was made to proceed with percutaneous thoracostomy tube placement.  The CT images were used to select a skin entry site which was marked. The right anterolateral chest wall was then sterilely prepped and draped in the usual fashion. Local anesthesia was attained by infiltration with 1% lidocaine. Using routine trocar technique, a Lacinda Axon  10 [place] multipurpose drain was advanced into the pleural space and directed apically over the lung. The locking loop was formed and the chest tube was connected to wall suction via a Pleur-Evac at -20 cm water. Followup axial CT imaging demonstrates near-total reinflation of the lung with only minimal residual basilar pneumothorax. The thoracostomy tube was secured to the skin with 0 Prolene suture and a sterile bandage was placed.  The patient tolerated the procedure very well.  COMPLICATIONS: Postprocedural pneumothorax requiring chest tube placement.  IMPRESSION: 1. Technically successful CT-guided lung biopsy of right upper lobe pulmonary nodule. 2. Placement of a 10 French percutaneous thoracostomy tube for treatment of immediate enlarging postprocedural pneumothorax. The patient remained hemodynamically stable and clinically asymptomatic throughout the procedure. He will be admitted for observation and management of the chest tube. Signed,  Criselda Peaches, MD  [facility]  [facility]   Electronically Signed   By: Jacqulynn Cadet M.D.   On: 11/28/2013 17:38     ASSESSMENT/PLAN: Jaime Morris is a pleasant 54 year old African American male recently diagnosed with extensive stage small cell lung cancer. He is status post 1 cycle of systemic chemotherapy with carboplatin for AUC of 5 given on day 1, etoposide 100 mg/m2 given on days 1, 2 an3 with neulast support  on day 4. Overall he tolerated his first cycle of chemotherapy relatively well with the exception of fatigue and brief constipation. The patient was discussed with and also seen by Dr. Julien Nordmann. He will continue with weekly labs as scheduled. He will follow up in 2 weeks for another symptom management visit prior to cycle # 2.  Jaime Morris, Jaime Stiverson E, PA-C  All questions were answered. The patient knows to call the clinic with any problems, questions or concerns. We can certainly see the patient much sooner if necessary.  ADDENDUM: Hematology/Oncology Attending: I had a face to face encounter with the patient. I recommended his care plan. This is a very pleasant 53 years old [place] American male with recently diagnosed with extensive stage small cell lung cancer and currently undergoing systemic chemotherapy with carboplatin and etoposide status post 1 cycle. He tolerated the first cycle of his treatment fairly well with no significant adverse effects. He denied having any significant nausea or vomiting. He has no fever or chills. I recommended for the patient to have his weekly lab work as scheduled and he would come back for followup visit in 2 weeks for evaluation before starting cycle #2.  He was advised to call immediately if she has any concerning symptoms in the interval.  Disclaimer: This note was dictated with voice recognition software. Similar sounding words can inadvertently be transcribed and may not be corrected upon review. Jaime Morris., MD 12/30/2013

## 2013-12-26 ENCOUNTER — Telehealth: Payer: Self-pay | Admitting: *Deleted

## 2013-12-26 NOTE — Telephone Encounter (Signed)
No new orders received from Dr. Julien Nordmann.  Called patient informing him this is nothing to worry about.  Asked that he call if area worsens (increased size, pain, itching, burning or weeping).  Reviewed how to call during and after hours.

## 2013-12-26 NOTE — Telephone Encounter (Signed)
Patient called reporting "red, raised rash or hives to left arm where neulasta received on 12-21-2013.  Touched left arm today and felt bumps.  Area not round but the size of a half dollar".   Denies itching, pain or burning.  No drainage.  "It doesn't bother me, I just don't know if this is normal so I want to report it.  Will notify providers.  May reach patient at [phone].

## 2014-01-01 ENCOUNTER — Other Ambulatory Visit (HOSPITAL_BASED_OUTPATIENT_CLINIC_OR_DEPARTMENT_OTHER): Payer: 59

## 2014-01-01 DIAGNOSIS — C3491 Malignant neoplasm of unspecified part of right bronchus or lung: Secondary | ICD-10-CM

## 2014-01-01 DIAGNOSIS — C3410 Malignant neoplasm of upper lobe, unspecified bronchus or lung: Secondary | ICD-10-CM

## 2014-01-01 LAB — CBC WITH DIFFERENTIAL/PLATELET
BASO%: 0.7 % (ref 0.0–2.0)
Basophils Absolute: 0.1 10*3/uL (ref 0.0–0.1)
EOS%: 0.7 % (ref 0.0–7.0)
Eosinophils Absolute: 0.1 10*3/uL (ref 0.0–0.5)
HCT: 43.6 % (ref 38.4–49.9)
HEMOGLOBIN: 14.4 g/dL (ref 13.0–17.1)
LYMPH#: 3.2 10*3/uL (ref 0.9–3.3)
LYMPH%: 24.2 % (ref 14.0–49.0)
MCH: 28.8 pg (ref 27.2–33.4)
MCHC: 32.9 g/dL (ref 32.0–36.0)
MCV: 87.6 fL (ref 79.3–98.0)
MONO#: 0.5 10*3/uL (ref 0.1–0.9)
MONO%: 3.9 % (ref 0.0–14.0)
NEUT#: 9.2 10*3/uL — ABNORMAL HIGH (ref 1.5–6.5)
NEUT%: 70.5 % (ref 39.0–75.0)
Platelets: 143 10*3/uL (ref 140–400)
RBC: 4.98 10*6/uL (ref 4.20–5.82)
RDW: 13.9 % (ref 11.0–14.6)
WBC: 13 10*3/uL — ABNORMAL HIGH (ref 4.0–10.3)

## 2014-01-01 LAB — COMPREHENSIVE METABOLIC PANEL (CC13)
ALBUMIN: 3.6 g/dL (ref 3.5–5.0)
ALT: 28 U/L (ref 0–55)
ANION GAP: 8 meq/L (ref 3–11)
AST: 19 U/L (ref 5–34)
Alkaline Phosphatase: 119 U/L (ref 40–150)
BUN: 9.3 mg/dL (ref 7.0–26.0)
CHLORIDE: 108 meq/L (ref 98–109)
CO2: 22 meq/L (ref 22–29)
CREATININE: 0.9 mg/dL (ref 0.7–1.3)
Calcium: 9.1 mg/dL (ref 8.4–10.4)
Glucose: 115 mg/dl (ref 70–140)
POTASSIUM: 4 meq/L (ref 3.5–5.1)
Sodium: 138 mEq/L (ref 136–145)
Total Bilirubin: 0.24 mg/dL (ref 0.20–1.20)
Total Protein: 7 g/dL (ref 6.4–8.3)

## 2014-01-02 ENCOUNTER — Telehealth: Payer: Self-pay | Admitting: *Deleted

## 2014-01-02 NOTE — Telephone Encounter (Signed)
Received call from Dr Nydia Bouton office stating that pt has an abcessed tooth that needs to be extracted.  He had started the pt on amoxicillin but he did not  Know if there was a time when he could have the tooth extracted.  Per Dr Vista Mink, pt's lab work looks good from 7/20, his next cycle is 7/27.  It is okay for pt to get tooth extracted this week.  Letter sent to Dr Lynne Logan office stating okay for pt to have a tooth extraction.  Letter signed and faxed to [phone].  SLJ

## 2014-01-03 ENCOUNTER — Telehealth: Payer: Self-pay | Admitting: *Deleted

## 2014-01-03 NOTE — Telephone Encounter (Signed)
Pt called wanting to verify that it was okay for him to have oral surgery today for his abscessed tooth.  Informed him that per Dr Vista Mink, it is okay for him to have the oral surgery.  (Refer to telephone note 01/02/14).  Pt verbalized understanding.  SLJ

## 2014-01-08 ENCOUNTER — Ambulatory Visit (HOSPITAL_BASED_OUTPATIENT_CLINIC_OR_DEPARTMENT_OTHER): Payer: 59 | Admitting: Internal Medicine

## 2014-01-08 ENCOUNTER — Telehealth: Payer: Self-pay | Admitting: Internal Medicine

## 2014-01-08 ENCOUNTER — Other Ambulatory Visit: Payer: Self-pay | Admitting: Internal Medicine

## 2014-01-08 ENCOUNTER — Ambulatory Visit (HOSPITAL_BASED_OUTPATIENT_CLINIC_OR_DEPARTMENT_OTHER): Payer: 59

## 2014-01-08 ENCOUNTER — Other Ambulatory Visit (HOSPITAL_BASED_OUTPATIENT_CLINIC_OR_DEPARTMENT_OTHER): Payer: 59

## 2014-01-08 VITALS — BP 117/73 | HR 76 | Temp 97.0°F | Resp 19

## 2014-01-08 DIAGNOSIS — C3410 Malignant neoplasm of upper lobe, unspecified bronchus or lung: Secondary | ICD-10-CM

## 2014-01-08 DIAGNOSIS — C3491 Malignant neoplasm of unspecified part of right bronchus or lung: Secondary | ICD-10-CM

## 2014-01-08 DIAGNOSIS — Z5111 Encounter for antineoplastic chemotherapy: Secondary | ICD-10-CM

## 2014-01-08 LAB — CBC WITH DIFFERENTIAL/PLATELET
BASO%: 0.7 % (ref 0.0–2.0)
BASOS ABS: 0.1 10*3/uL (ref 0.0–0.1)
EOS ABS: 0.1 10*3/uL (ref 0.0–0.5)
EOS%: 1.1 % (ref 0.0–7.0)
HCT: 41.8 % (ref 38.4–49.9)
HEMOGLOBIN: 14.1 g/dL (ref 13.0–17.1)
LYMPH#: 2.6 10*3/uL (ref 0.9–3.3)
LYMPH%: 31.4 % (ref 14.0–49.0)
MCH: 29.6 pg (ref 27.2–33.4)
MCHC: 33.8 g/dL (ref 32.0–36.0)
MCV: 87.5 fL (ref 79.3–98.0)
MONO#: 0.6 10*3/uL (ref 0.1–0.9)
MONO%: 7.4 % (ref 0.0–14.0)
NEUT%: 59.4 % (ref 39.0–75.0)
NEUTROS ABS: 4.9 10*3/uL (ref 1.5–6.5)
Platelets: 270 10*3/uL (ref 140–400)
RBC: 4.77 10*6/uL (ref 4.20–5.82)
RDW: 14.2 % (ref 11.0–14.6)
WBC: 8.2 10*3/uL (ref 4.0–10.3)

## 2014-01-08 LAB — COMPREHENSIVE METABOLIC PANEL (CC13)
ALBUMIN: 3.5 g/dL (ref 3.5–5.0)
ALK PHOS: 92 U/L (ref 40–150)
ALT: 32 U/L (ref 0–55)
AST: 23 U/L (ref 5–34)
Anion Gap: 9 mEq/L (ref 3–11)
BUN: 11.7 mg/dL (ref 7.0–26.0)
CHLORIDE: 110 meq/L — AB (ref 98–109)
CO2: 20 mEq/L — ABNORMAL LOW (ref 22–29)
Calcium: 9 mg/dL (ref 8.4–10.4)
Creatinine: 0.8 mg/dL (ref 0.7–1.3)
GLUCOSE: 136 mg/dL (ref 70–140)
POTASSIUM: 3.8 meq/L (ref 3.5–5.1)
Sodium: 139 mEq/L (ref 136–145)
Total Bilirubin: 0.29 mg/dL (ref 0.20–1.20)
Total Protein: 6.8 g/dL (ref 6.4–8.3)

## 2014-01-08 MED ORDER — SODIUM CHLORIDE 0.9 % IV SOLN
Freq: Once | INTRAVENOUS | Status: AC
  Administered 2014-01-08: 09:00:00 via INTRAVENOUS

## 2014-01-08 MED ORDER — ONDANSETRON 16 MG/50ML IVPB (CHCC)
16.0000 mg | Freq: Once | INTRAVENOUS | Status: AC
  Administered 2014-01-08: 16 mg via INTRAVENOUS

## 2014-01-08 MED ORDER — SODIUM CHLORIDE 0.9 % IV SOLN
750.0000 mg | Freq: Once | INTRAVENOUS | Status: AC
  Administered 2014-01-08: 750 mg via INTRAVENOUS
  Filled 2014-01-08: qty 75

## 2014-01-08 MED ORDER — SODIUM CHLORIDE 0.9 % IJ SOLN
10.0000 mL | INTRAMUSCULAR | Status: DC | PRN
  Administered 2014-01-08: 10 mL
  Filled 2014-01-08: qty 10

## 2014-01-08 MED ORDER — HEPARIN SOD (PORK) LOCK FLUSH 100 UNIT/ML IV SOLN
500.0000 [IU] | Freq: Once | INTRAVENOUS | Status: AC | PRN
  Administered 2014-01-08: 500 [IU]
  Filled 2014-01-08: qty 5

## 2014-01-08 MED ORDER — DEXAMETHASONE SODIUM PHOSPHATE 20 MG/5ML IJ SOLN
20.0000 mg | Freq: Once | INTRAMUSCULAR | Status: AC
  Administered 2014-01-08: 20 mg via INTRAVENOUS

## 2014-01-08 MED ORDER — DEXAMETHASONE SODIUM PHOSPHATE 20 MG/5ML IJ SOLN
INTRAMUSCULAR | Status: AC
  Filled 2014-01-08: qty 5

## 2014-01-08 MED ORDER — ONDANSETRON 16 MG/50ML IVPB (CHCC)
INTRAVENOUS | Status: AC
  Filled 2014-01-08: qty 16

## 2014-01-08 MED ORDER — SODIUM CHLORIDE 0.9 % IV SOLN
100.0000 mg/m2 | Freq: Once | INTRAVENOUS | Status: AC
  Administered 2014-01-08: 210 mg via INTRAVENOUS
  Filled 2014-01-08: qty 10.5

## 2014-01-08 NOTE — Progress Notes (Signed)
[facility] [phone]   [phone]  OFFICE PROGRESS NOTE  Irven Shelling, MD [employer], [address] [place] [address]  DIAGNOSIS: Small cell carcinoma of lung, Extensive stage  Primary site: Lung (Right)  Staging method: AJCC 7th Edition  Clinical: Stage IV (T1a, N1, M1b) signed by Curt Bears, MD on 12/07/2013 3:19 PM  Summary: Stage IV (T1a, N1, M1b)   PRIOR THERAPY: none   CURRENT THERAPY: Systemic chemotherapy with carboplatin for AUC of 5 given on day 1, etoposide 100 mg/m2 given on days 1, 2 an3 with neulast support on day 4. Status post 1 cycle.   DISEASE STAGE:  Small cell carcinoma of lung, Extensive stage  Primary site: Lung (Right)  Staging method: AJCC 7th Edition  Clinical: Stage IV (T1a, N1, M1b) signed by Curt Bears, MD on 12/07/2013 3:19 PM  Summary: Stage IV (T1a, N1, M1b)  CHEMOTHERAPY INTENT: pallative  CURRENT # OF CHEMOTHERAPY CYCLES: 2 CURRENT ANTIEMETICS: Zofran, dexamethasone and compazine  CURRENT SMOKING STATUS: Former smoker, quit 12/18/2013  ORAL CHEMOTHERAPY AND CONSENT: n/a  CURRENT BISPHOSPHONATES USE: none  PAIN MANAGEMENT: Lortab 5/325 mg  NARCOTICS INDUCED CONSTIPATION: none  LIVING WILL AND CODE STATUS: ?   INTERVAL HISTORY: Jaime Morris 53 y.o. male returns to the clinic today for followup visit. The patient related the first cycle of his systemic chemotherapy with carboplatin and etoposide fairly well with no significant adverse effects. He denied having any significant fever or chills, no nausea or vomiting. He denied having any significant weight loss or night sweats. He has no chest pain, shortness breath, cough or hemoptysis. He is here today to start cycle #2 of his systemic chemotherapy.  MEDICAL HISTORY: Past Medical History  Diagnosis Date  . HIV infection     DR. [name]  . Hyperlipidemia   . Insomnia   . Lung nodule, multiple 10/24/13    CT CHEST W/O...DR.  [name]  . Gynecomastia, male 10/16/13    MILD RIGHT SIDED PER RADIOLOGY STUDY  . COPD (chronic obstructive pulmonary disease)     PFT'S 10/26/13 @ [facility]  . History of depression   . History of pneumonia   . Small cell lung cancer   . Collapsed lung     d/t biopsy, right    ALLERGIES:  has No Known Allergies.  MEDICATIONS:  Current Outpatient Prescriptions  Medication Sig Dispense Refill  . albuterol (PROAIR HFA) 108 (90 BASE) MCG/ACT inhaler Inhale 2 puffs into the lungs every 4 (four) hours as needed for wheezing or shortness of breath.       . doxylamine, Sleep, (UNISOM) 25 MG tablet Take 50 mg by mouth at bedtime as needed for sleep.       Marland Kitchen efavirenz-emtricitabine-tenofovir (ATRIPLA) 600-200-300 MG per tablet Take 1 tablet by mouth daily.  90 tablet  3  . fluticasone (FLONASE) 50 MCG/ACT nasal spray Place 2 sprays into both nostrils daily as needed for allergies.       Marland Kitchen HYDROcodone-acetaminophen (LORTAB) 5-325 MG per tablet Take 1-2 tablets by mouth every 6 (six) hours as needed for moderate pain.  30 tablet  0  . lidocaine-prilocaine (EMLA) cream Apply 1 application topically as needed. Apply to port 1 hour before chemo appointment.  30 g  0  . Melatonin 10 MG TABS Take 10 mg by mouth at bedtime as needed (sleep).      Marland Kitchen OVER THE COUNTER MEDICATION daily. Essential Oils      .  prochlorperazine (COMPAZINE) 10 MG tablet Take 1 tablet (10 mg total) by mouth every 6 (six) hours as needed for nausea or vomiting.  30 tablet  1  . traZODone (DESYREL) 100 MG tablet Take 100 mg by mouth at bedtime.      . varenicline (CHANTIX) 1 MG tablet Take 1 mg by mouth 2 (two) times daily.        No current facility-administered medications for this visit.   Facility-Administered Medications Ordered in Other Visits  Medication Dose Route Frequency Provider Last Rate Last Dose  . CARBOplatin (PARAPLATIN) 750 mg in sodium chloride 0.9 % 250 mL chemo infusion  750 mg Intravenous Once Curt Bears, MD      . etoposide (VEPESID) 210 mg in sodium chloride 0.9 % 600 mL chemo infusion  100 mg/m2 (Treatment Plan Actual) Intravenous Once Curt Bears, MD      . heparin lock flush 100 unit/mL  500 Units Intracatheter Once PRN Curt Bears, MD      . sodium chloride 0.9 % injection 10 mL  10 mL Intracatheter PRN Curt Bears, MD        SURGICAL HISTORY:  Past Surgical History  Procedure Laterality Date  . Foot surgery      BUNIONECTOMY  . Back surgery      L 4-5 FUSION WITH SCREW  . Colonoscopy  UTD  . Portacath placement N/A 12/08/2013    Procedure: INSERTION PORT-A-CATH;  Surgeon: Grace Isaac, MD;  Location: [facility] OR;  Service: Thoracic;  Laterality: N/A;    REVIEW OF SYSTEMS:  A comprehensive review of systems was negative except for: Constitutional: positive for fatigue   PHYSICAL EXAMINATION: General appearance: alert, cooperative, fatigued and no distress Head: Normocephalic, without obvious abnormality, atraumatic Neck: no adenopathy, no JVD, supple, symmetrical, trachea midline and thyroid not enlarged, symmetric, no tenderness/mass/nodules Lymph nodes: Cervical, supraclavicular, and axillary nodes normal. Resp: clear to auscultation bilaterally Back: symmetric, no curvature. ROM normal. No CVA tenderness. Cardio: regular rate and rhythm, S1, S2 normal, no murmur, click, rub or gallop GI: soft, non-tender; bowel sounds normal; no masses,  no organomegaly Extremities: extremities normal, atraumatic, no cyanosis or edema Neurologic: Alert and oriented X 3, normal strength and tone. Normal symmetric reflexes. Normal coordination and gait  ECOG PERFORMANCE STATUS: 1 - Symptomatic but completely ambulatory  There were no vitals taken for this visit.  LABORATORY DATA: Lab Results  Component Value Date   WBC 8.2 01/08/2014   HGB 14.1 01/08/2014   HCT 41.8 01/08/2014   MCV 87.5 01/08/2014   PLT 270 01/08/2014      Chemistry      Component Value Date/Time    NA 139 01/08/2014 0823   NA 137 10/23/2013 1112   K 3.8 01/08/2014 0823   K 4.9 10/23/2013 1112   CL 105 10/23/2013 1112   CO2 20* 01/08/2014 0823   CO2 24 10/23/2013 1112   BUN 11.7 01/08/2014 0823   BUN 10 10/23/2013 1112   CREATININE 0.8 01/08/2014 0823   CREATININE 0.79 10/23/2013 1112   CREATININE 0.84 07/21/2010 2105      Component Value Date/Time   CALCIUM 9.0 01/08/2014 0823   CALCIUM 9.5 10/23/2013 1112   ALKPHOS 92 01/08/2014 0823   ALKPHOS 90 10/23/2013 1112   AST 23 01/08/2014 0823   AST 18 10/23/2013 1112   ALT 32 01/08/2014 0823   ALT 21 10/23/2013 1112   BILITOT 0.29 01/08/2014 0823   BILITOT 0.4 10/23/2013 1112  RADIOGRAPHIC STUDIES: No results found.  ASSESSMENT AND PLAN:  This is a very pleasant 53 years old African American male recently diagnosed with extensive stage small cell lung cancer and currently undergoing systemic chemotherapy with carboplatin and etoposide status post 1 cycle. The patient related the first cycle of his treatment fairly well with no significant adverse effects. I recommended for him to proceed with cycle #2 today as scheduled. I would see the patient back for followup visit in 3 weeks after repeating CT scan of the chest, abdomen and pelvis for restaging of his disease.  He was advised to call immediately if he has any concerning symptoms in the interval. The patient voices understanding of current disease status and treatment options and is in agreement with the current care plan.  All questions were answered. The patient knows to call the clinic with any problems, questions or concerns. We can certainly see the patient much sooner if necessary.  Disclaimer: This note was dictated with voice recognition software. Similar sounding words can inadvertently be transcribed and may not be corrected upon review.

## 2014-01-08 NOTE — Telephone Encounter (Signed)
gv adn rpinted appt sched and avs for pt for July adn Aug...sed added tx.

## 2014-01-08 NOTE — Patient Instructions (Addendum)
[facility] Discharge Instructions for Patients Receiving Chemotherapy  Today you received the following chemotherapy agents: Carboplatin, Etoposide  To help prevent nausea and vomiting after your treatment, we encourage you to take your nausea medication as prescribed by your physician.    If you develop nausea and vomiting that is not controlled by your nausea medication, call the clinic.   BELOW ARE SYMPTOMS THAT SHOULD BE REPORTED IMMEDIATELY:  *FEVER GREATER THAN 100.5 F  *CHILLS WITH OR WITHOUT FEVER  NAUSEA AND VOMITING THAT IS NOT CONTROLLED WITH YOUR NAUSEA MEDICATION  *UNUSUAL SHORTNESS OF BREATH  *UNUSUAL BRUISING OR BLEEDING  TENDERNESS IN MOUTH AND THROAT WITH OR WITHOUT PRESENCE OF ULCERS  *URINARY PROBLEMS  *BOWEL PROBLEMS  UNUSUAL RASH Items with * indicate a potential emergency and should be followed up as soon as possible.  Feel free to call the clinic you have any questions or concerns. The clinic phone number is [phone].

## 2014-01-08 NOTE — Telephone Encounter (Signed)
per Diane add to MD sched...done....pt was seen in chemo

## 2014-01-08 NOTE — Telephone Encounter (Signed)
lvm for pt regarding to MD visit on day 2..per MD ok.Marland KitchenMarland Kitchen

## 2014-01-09 ENCOUNTER — Encounter: Payer: Self-pay | Admitting: Internal Medicine

## 2014-01-09 ENCOUNTER — Ambulatory Visit (HOSPITAL_BASED_OUTPATIENT_CLINIC_OR_DEPARTMENT_OTHER): Payer: 59

## 2014-01-09 ENCOUNTER — Ambulatory Visit: Payer: 59 | Admitting: Internal Medicine

## 2014-01-09 VITALS — BP 148/85 | HR 86 | Temp 98.5°F | Resp 16

## 2014-01-09 DIAGNOSIS — Z5111 Encounter for antineoplastic chemotherapy: Secondary | ICD-10-CM

## 2014-01-09 DIAGNOSIS — C3410 Malignant neoplasm of upper lobe, unspecified bronchus or lung: Secondary | ICD-10-CM

## 2014-01-09 DIAGNOSIS — C3491 Malignant neoplasm of unspecified part of right bronchus or lung: Secondary | ICD-10-CM

## 2014-01-09 MED ORDER — PROCHLORPERAZINE MALEATE 10 MG PO TABS
ORAL_TABLET | ORAL | Status: AC
  Filled 2014-01-09: qty 1

## 2014-01-09 MED ORDER — HEPARIN SOD (PORK) LOCK FLUSH 100 UNIT/ML IV SOLN
500.0000 [IU] | Freq: Once | INTRAVENOUS | Status: AC | PRN
  Administered 2014-01-09: 500 [IU]
  Filled 2014-01-09: qty 5

## 2014-01-09 MED ORDER — SODIUM CHLORIDE 0.9 % IJ SOLN
10.0000 mL | INTRAMUSCULAR | Status: DC | PRN
  Administered 2014-01-09: 10 mL
  Filled 2014-01-09: qty 10

## 2014-01-09 MED ORDER — PROCHLORPERAZINE MALEATE 10 MG PO TABS
10.0000 mg | ORAL_TABLET | Freq: Once | ORAL | Status: AC
  Administered 2014-01-09: 10 mg via ORAL

## 2014-01-09 MED ORDER — SODIUM CHLORIDE 0.9 % IV SOLN
Freq: Once | INTRAVENOUS | Status: AC
  Administered 2014-01-09: 09:00:00 via INTRAVENOUS

## 2014-01-09 MED ORDER — SODIUM CHLORIDE 0.9 % IV SOLN
100.0000 mg/m2 | Freq: Once | INTRAVENOUS | Status: AC
  Administered 2014-01-09: 210 mg via INTRAVENOUS
  Filled 2014-01-09: qty 10.5

## 2014-01-09 NOTE — Patient Instructions (Signed)
[facility] Discharge Instructions for Patients Receiving Chemotherapy  Today you received the following chemotherapy agents etoposide.  To help prevent nausea and vomiting after your treatment, we encourage you to take your nausea medication compazine.   If you develop nausea and vomiting that is not controlled by your nausea medication, call the clinic.   BELOW ARE SYMPTOMS THAT SHOULD BE REPORTED IMMEDIATELY:  *FEVER GREATER THAN 100.5 F  *CHILLS WITH OR WITHOUT FEVER  NAUSEA AND VOMITING THAT IS NOT CONTROLLED WITH YOUR NAUSEA MEDICATION  *UNUSUAL SHORTNESS OF BREATH  *UNUSUAL BRUISING OR BLEEDING  TENDERNESS IN MOUTH AND THROAT WITH OR WITHOUT PRESENCE OF ULCERS  *URINARY PROBLEMS  *BOWEL PROBLEMS  UNUSUAL RASH Items with * indicate a potential emergency and should be followed up as soon as possible.  Feel free to call the clinic you have any questions or concerns. The clinic phone number is [phone].

## 2014-01-09 NOTE — Progress Notes (Signed)
Put 3 disability forms on nurse's desk.

## 2014-01-10 ENCOUNTER — Ambulatory Visit (HOSPITAL_BASED_OUTPATIENT_CLINIC_OR_DEPARTMENT_OTHER): Payer: 59

## 2014-01-10 VITALS — BP 124/79 | HR 76 | Temp 97.9°F

## 2014-01-10 DIAGNOSIS — C3491 Malignant neoplasm of unspecified part of right bronchus or lung: Secondary | ICD-10-CM

## 2014-01-10 DIAGNOSIS — Z5111 Encounter for antineoplastic chemotherapy: Secondary | ICD-10-CM

## 2014-01-10 DIAGNOSIS — C3410 Malignant neoplasm of upper lobe, unspecified bronchus or lung: Secondary | ICD-10-CM

## 2014-01-10 MED ORDER — HEPARIN SOD (PORK) LOCK FLUSH 100 UNIT/ML IV SOLN
500.0000 [IU] | Freq: Once | INTRAVENOUS | Status: AC | PRN
Start: 2014-01-10 — End: 2014-01-10
  Administered 2014-01-10: 500 [IU]
  Filled 2014-01-10: qty 5

## 2014-01-10 MED ORDER — SODIUM CHLORIDE 0.9 % IV SOLN
100.0000 mg/m2 | Freq: Once | INTRAVENOUS | Status: AC
  Administered 2014-01-10: 210 mg via INTRAVENOUS
  Filled 2014-01-10: qty 10.5

## 2014-01-10 MED ORDER — SODIUM CHLORIDE 0.9 % IV SOLN
Freq: Once | INTRAVENOUS | Status: AC
  Administered 2014-01-10: 09:00:00 via INTRAVENOUS

## 2014-01-10 MED ORDER — PROCHLORPERAZINE MALEATE 10 MG PO TABS
10.0000 mg | ORAL_TABLET | Freq: Once | ORAL | Status: AC
  Administered 2014-01-10: 10 mg via ORAL

## 2014-01-10 MED ORDER — SODIUM CHLORIDE 0.9 % IJ SOLN
10.0000 mL | INTRAMUSCULAR | Status: DC | PRN
  Administered 2014-01-10: 10 mL
  Filled 2014-01-10: qty 10

## 2014-01-10 MED ORDER — PROCHLORPERAZINE MALEATE 10 MG PO TABS
ORAL_TABLET | ORAL | Status: AC
  Filled 2014-01-10: qty 1

## 2014-01-10 NOTE — Patient Instructions (Signed)
[facility] Discharge Instructions for Patients Receiving Chemotherapy  Today you received the following chemotherapy agent: Etoposide  To help prevent nausea and vomiting after your treatment, we encourage you to take your nausea medication as directed:  Compazine 10 mg every 6 hours as needed   If you develop nausea and vomiting that is not controlled by your nausea medication, call the clinic.   BELOW ARE SYMPTOMS THAT SHOULD BE REPORTED IMMEDIATELY:  *FEVER GREATER THAN 100.5 F  *CHILLS WITH OR WITHOUT FEVER  NAUSEA AND VOMITING THAT IS NOT CONTROLLED WITH YOUR NAUSEA MEDICATION  *UNUSUAL SHORTNESS OF BREATH  *UNUSUAL BRUISING OR BLEEDING  TENDERNESS IN MOUTH AND THROAT WITH OR WITHOUT PRESENCE OF ULCERS  *URINARY PROBLEMS  *BOWEL PROBLEMS  UNUSUAL RASH Items with * indicate a potential emergency and should be followed up as soon as possible.  Feel free to call the clinic should you have any questions or concerns. The clinic phone number is [phone].  It has been a pleasure to serve you today!

## 2014-01-11 ENCOUNTER — Encounter: Payer: Self-pay | Admitting: Internal Medicine

## 2014-01-11 ENCOUNTER — Ambulatory Visit (HOSPITAL_BASED_OUTPATIENT_CLINIC_OR_DEPARTMENT_OTHER): Payer: 59

## 2014-01-11 VITALS — BP 132/85 | HR 87 | Temp 98.0°F

## 2014-01-11 DIAGNOSIS — C3410 Malignant neoplasm of upper lobe, unspecified bronchus or lung: Secondary | ICD-10-CM

## 2014-01-11 DIAGNOSIS — C3491 Malignant neoplasm of unspecified part of right bronchus or lung: Secondary | ICD-10-CM

## 2014-01-11 DIAGNOSIS — Z5189 Encounter for other specified aftercare: Secondary | ICD-10-CM

## 2014-01-11 MED ORDER — PEGFILGRASTIM INJECTION 6 MG/0.6ML
6.0000 mg | Freq: Once | SUBCUTANEOUS | Status: AC
  Administered 2014-01-11: 6 mg via SUBCUTANEOUS
  Filled 2014-01-11: qty 0.6

## 2014-01-11 NOTE — Progress Notes (Signed)
Put disability forms in registration

## 2014-01-15 ENCOUNTER — Other Ambulatory Visit (HOSPITAL_BASED_OUTPATIENT_CLINIC_OR_DEPARTMENT_OTHER): Payer: 59

## 2014-01-15 DIAGNOSIS — C3410 Malignant neoplasm of upper lobe, unspecified bronchus or lung: Secondary | ICD-10-CM

## 2014-01-15 DIAGNOSIS — C3491 Malignant neoplasm of unspecified part of right bronchus or lung: Secondary | ICD-10-CM

## 2014-01-15 LAB — CBC WITH DIFFERENTIAL/PLATELET
BASO%: 0.8 % (ref 0.0–2.0)
Basophils Absolute: 0.1 10*3/uL (ref 0.0–0.1)
EOS%: 0.4 % (ref 0.0–7.0)
Eosinophils Absolute: 0.1 10*3/uL (ref 0.0–0.5)
HCT: 38.9 % (ref 38.4–49.9)
HGB: 12.7 g/dL — ABNORMAL LOW (ref 13.0–17.1)
LYMPH%: 13.9 % — ABNORMAL LOW (ref 14.0–49.0)
MCH: 28.7 pg (ref 27.2–33.4)
MCHC: 32.7 g/dL (ref 32.0–36.0)
MCV: 87.8 fL (ref 79.3–98.0)
MONO#: 0.5 10*3/uL (ref 0.1–0.9)
MONO%: 2.8 % (ref 0.0–14.0)
NEUT%: 82.1 % — ABNORMAL HIGH (ref 39.0–75.0)
NEUTROS ABS: 13.2 10*3/uL — AB (ref 1.5–6.5)
Platelets: 271 10*3/uL (ref 140–400)
RBC: 4.43 10*6/uL (ref 4.20–5.82)
RDW: 14.1 % (ref 11.0–14.6)
WBC: 16.1 10*3/uL — AB (ref 4.0–10.3)
lymph#: 2.2 10*3/uL (ref 0.9–3.3)

## 2014-01-15 LAB — COMPREHENSIVE METABOLIC PANEL (CC13)
ALK PHOS: 146 U/L (ref 40–150)
ALT: 25 U/L (ref 0–55)
AST: 19 U/L (ref 5–34)
Albumin: 3.7 g/dL (ref 3.5–5.0)
Anion Gap: 7 mEq/L (ref 3–11)
BUN: 11.9 mg/dL (ref 7.0–26.0)
CO2: 24 meq/L (ref 22–29)
Calcium: 9 mg/dL (ref 8.4–10.4)
Chloride: 107 mEq/L (ref 98–109)
Creatinine: 0.8 mg/dL (ref 0.7–1.3)
GLUCOSE: 112 mg/dL (ref 70–140)
POTASSIUM: 4.2 meq/L (ref 3.5–5.1)
Sodium: 138 mEq/L (ref 136–145)
Total Bilirubin: 0.34 mg/dL (ref 0.20–1.20)
Total Protein: 6.9 g/dL (ref 6.4–8.3)

## 2014-01-16 ENCOUNTER — Ambulatory Visit (INDEPENDENT_AMBULATORY_CARE_PROVIDER_SITE_OTHER): Payer: 59 | Admitting: Internal Medicine

## 2014-01-16 ENCOUNTER — Encounter: Payer: Self-pay | Admitting: Internal Medicine

## 2014-01-16 VITALS — BP 154/81 | HR 81 | Temp 98.1°F | Ht 70.0 in | Wt 194.0 lb

## 2014-01-16 DIAGNOSIS — B20 Human immunodeficiency virus [HIV] disease: Secondary | ICD-10-CM

## 2014-01-16 DIAGNOSIS — C3490 Malignant neoplasm of unspecified part of unspecified bronchus or lung: Secondary | ICD-10-CM

## 2014-01-17 ENCOUNTER — Encounter: Payer: Self-pay | Admitting: *Deleted

## 2014-01-17 LAB — T-HELPER CELL (CD4) - (RCID CLINIC ONLY)
CD4 % Helper T Cell: 41 % (ref 33–55)
CD4 T Cell Abs: 1060 /uL (ref 400–2700)

## 2014-01-17 NOTE — Progress Notes (Signed)
   Subjective:    Patient ID: Jaime Morris, male    DOB: [birth year], 53 y.o.   MRN: [id number]  HPI Jaime Morris comes in for 042.  He continues on Atripla and last labs in May 2015 show CD4 of 840 with a continued undetectable viral load.  He has had no issues related to HIV.  Unfortunately, he was diagnosed earlier this year with terminal small cell lung cancer.  He is undergoing chemotherapy.  He is planning to retire from his job later this year and move to [place] to be closer to family.     Review of Systems  Constitutional: Negative for fever, chills, fatigue and unexpected weight change.  Gastrointestinal: Negative for nausea and diarrhea.  Skin: Negative for rash.  Neurological: Negative for dizziness.  Hematological: Negative for adenopathy.       Objective:   Physical Exam  Constitutional: He appears well-developed and well-nourished. No distress.  Eyes: No scleral icterus.  Cardiovascular: Normal rate, regular rhythm and normal heart sounds.   No murmur heard. Pulmonary/Chest: Effort normal and breath sounds normal. No respiratory distress.  Lymphadenopathy:    He has no cervical adenopathy.  Skin:  Has port-a-cath          Assessment & Plan:

## 2014-01-17 NOTE — Assessment & Plan Note (Signed)
He is applying for disability and I discussed with him to be sure he maintains medical coverage.

## 2014-01-17 NOTE — Assessment & Plan Note (Signed)
I will recheck his labs today and again in three months.

## 2014-01-19 LAB — HIV-1 RNA QUANT-NO REFLEX-BLD: HIV-1 RNA Quant, Log: <1.3 {Log} (ref <1.30)

## 2014-01-22 ENCOUNTER — Other Ambulatory Visit (HOSPITAL_BASED_OUTPATIENT_CLINIC_OR_DEPARTMENT_OTHER): Payer: 59

## 2014-01-22 DIAGNOSIS — C3410 Malignant neoplasm of upper lobe, unspecified bronchus or lung: Secondary | ICD-10-CM

## 2014-01-22 DIAGNOSIS — C3491 Malignant neoplasm of unspecified part of right bronchus or lung: Secondary | ICD-10-CM

## 2014-01-22 LAB — CBC WITH DIFFERENTIAL/PLATELET
BASO%: 0.7 % (ref 0.0–2.0)
BASOS ABS: 0.1 10*3/uL (ref 0.0–0.1)
EOS ABS: 0.1 10*3/uL (ref 0.0–0.5)
EOS%: 0.7 % (ref 0.0–7.0)
HCT: 42.8 % (ref 38.4–49.9)
HGB: 14.5 g/dL (ref 13.0–17.1)
LYMPH%: 22.9 % (ref 14.0–49.0)
MCH: 29.6 pg (ref 27.2–33.4)
MCHC: 34 g/dL (ref 32.0–36.0)
MCV: 87.2 fL (ref 79.3–98.0)
MONO#: 0.6 10*3/uL (ref 0.1–0.9)
MONO%: 5.1 % (ref 0.0–14.0)
NEUT#: 8.1 10*3/uL — ABNORMAL HIGH (ref 1.5–6.5)
NEUT%: 70.6 % (ref 39.0–75.0)
PLATELETS: 120 10*3/uL — AB (ref 140–400)
RBC: 4.91 10*6/uL (ref 4.20–5.82)
RDW: 14.2 % (ref 11.0–14.6)
WBC: 11.5 10*3/uL — ABNORMAL HIGH (ref 4.0–10.3)
lymph#: 2.6 10*3/uL (ref 0.9–3.3)

## 2014-01-22 LAB — COMPREHENSIVE METABOLIC PANEL
ALT: 28 U/L (ref 0–53)
AST: 23 U/L (ref 0–37)
Albumin: 4.2 g/dL (ref 3.5–5.2)
Alkaline Phosphatase: 138 U/L — ABNORMAL HIGH (ref 39–117)
BILIRUBIN TOTAL: 0.5 mg/dL (ref 0.2–1.2)
BUN: 12 mg/dL (ref 6–23)
CO2: 23 mEq/L (ref 19–32)
CREATININE: 0.97 mg/dL (ref 0.50–1.35)
Calcium: 9.6 mg/dL (ref 8.4–10.5)
Chloride: 102 mEq/L (ref 96–112)
Glucose, Bld: 106 mg/dL — ABNORMAL HIGH (ref 70–99)
Potassium: 4.2 mEq/L (ref 3.5–5.3)
SODIUM: 134 meq/L — AB (ref 135–145)
Total Protein: 7.3 g/dL (ref 6.0–8.3)

## 2014-01-29 ENCOUNTER — Other Ambulatory Visit (HOSPITAL_BASED_OUTPATIENT_CLINIC_OR_DEPARTMENT_OTHER): Payer: 59

## 2014-01-29 ENCOUNTER — Other Ambulatory Visit: Payer: Self-pay | Admitting: Internal Medicine

## 2014-01-29 ENCOUNTER — Ambulatory Visit (HOSPITAL_BASED_OUTPATIENT_CLINIC_OR_DEPARTMENT_OTHER): Payer: 59

## 2014-01-29 VITALS — BP 139/75 | HR 83 | Temp 98.7°F | Resp 18

## 2014-01-29 DIAGNOSIS — C3410 Malignant neoplasm of upper lobe, unspecified bronchus or lung: Secondary | ICD-10-CM

## 2014-01-29 DIAGNOSIS — C3491 Malignant neoplasm of unspecified part of right bronchus or lung: Secondary | ICD-10-CM

## 2014-01-29 DIAGNOSIS — Z5111 Encounter for antineoplastic chemotherapy: Secondary | ICD-10-CM

## 2014-01-29 LAB — CBC WITH DIFFERENTIAL/PLATELET
BASO%: 0.3 % (ref 0.0–2.0)
Basophils Absolute: 0 10*3/uL (ref 0.0–0.1)
EOS%: 0.9 % (ref 0.0–7.0)
Eosinophils Absolute: 0.1 10*3/uL (ref 0.0–0.5)
HEMATOCRIT: 40.6 % (ref 38.4–49.9)
HGB: 13.5 g/dL (ref 13.0–17.1)
LYMPH%: 25.4 % (ref 14.0–49.0)
MCH: 29.6 pg (ref 27.2–33.4)
MCHC: 33.4 g/dL (ref 32.0–36.0)
MCV: 88.7 fL (ref 79.3–98.0)
MONO#: 0.8 10*3/uL (ref 0.1–0.9)
MONO%: 8.1 % (ref 0.0–14.0)
NEUT#: 6.6 10*3/uL — ABNORMAL HIGH (ref 1.5–6.5)
NEUT%: 65.3 % (ref 39.0–75.0)
PLATELETS: 294 10*3/uL (ref 140–400)
RBC: 4.57 10*6/uL (ref 4.20–5.82)
RDW: 15.7 % — ABNORMAL HIGH (ref 11.0–14.6)
WBC: 10.2 10*3/uL (ref 4.0–10.3)
lymph#: 2.6 10*3/uL (ref 0.9–3.3)

## 2014-01-29 LAB — COMPREHENSIVE METABOLIC PANEL (CC13)
ALT: 24 U/L (ref 0–55)
AST: 24 U/L (ref 5–34)
Albumin: 3.6 g/dL (ref 3.5–5.0)
Alkaline Phosphatase: 104 U/L (ref 40–150)
Anion Gap: 8 mEq/L (ref 3–11)
BILIRUBIN TOTAL: 0.25 mg/dL (ref 0.20–1.20)
BUN: 12.7 mg/dL (ref 7.0–26.0)
CO2: 25 mEq/L (ref 22–29)
CREATININE: 0.9 mg/dL (ref 0.7–1.3)
Calcium: 9.4 mg/dL (ref 8.4–10.4)
Chloride: 105 mEq/L (ref 98–109)
Glucose: 103 mg/dl (ref 70–140)
Potassium: 4.5 mEq/L (ref 3.5–5.1)
Sodium: 138 mEq/L (ref 136–145)
Total Protein: 7.1 g/dL (ref 6.4–8.3)

## 2014-01-29 MED ORDER — SODIUM CHLORIDE 0.9 % IV SOLN
100.0000 mg/m2 | Freq: Once | INTRAVENOUS | Status: AC
  Administered 2014-01-29: 210 mg via INTRAVENOUS
  Filled 2014-01-29: qty 10.5

## 2014-01-29 MED ORDER — SODIUM CHLORIDE 0.9 % IV SOLN
700.0000 mg | Freq: Once | INTRAVENOUS | Status: AC
  Administered 2014-01-29: 700 mg via INTRAVENOUS
  Filled 2014-01-29: qty 70

## 2014-01-29 MED ORDER — HEPARIN SOD (PORK) LOCK FLUSH 100 UNIT/ML IV SOLN
500.0000 [IU] | Freq: Once | INTRAVENOUS | Status: AC | PRN
  Administered 2014-01-29: 500 [IU]
  Filled 2014-01-29: qty 5

## 2014-01-29 MED ORDER — ONDANSETRON 16 MG/50ML IVPB (CHCC)
INTRAVENOUS | Status: AC
  Filled 2014-01-29: qty 16

## 2014-01-29 MED ORDER — SODIUM CHLORIDE 0.9 % IV SOLN
Freq: Once | INTRAVENOUS | Status: AC
  Administered 2014-01-29: 12:00:00 via INTRAVENOUS

## 2014-01-29 MED ORDER — SODIUM CHLORIDE 0.9 % IJ SOLN
10.0000 mL | INTRAMUSCULAR | Status: DC | PRN
  Administered 2014-01-29: 10 mL
  Filled 2014-01-29: qty 10

## 2014-01-29 MED ORDER — DEXAMETHASONE SODIUM PHOSPHATE 20 MG/5ML IJ SOLN
INTRAMUSCULAR | Status: AC
  Filled 2014-01-29: qty 5

## 2014-01-29 MED ORDER — ONDANSETRON 16 MG/50ML IVPB (CHCC)
16.0000 mg | Freq: Once | INTRAVENOUS | Status: AC
  Administered 2014-01-29: 16 mg via INTRAVENOUS

## 2014-01-29 MED ORDER — DEXAMETHASONE SODIUM PHOSPHATE 20 MG/5ML IJ SOLN
20.0000 mg | Freq: Once | INTRAMUSCULAR | Status: AC
  Administered 2014-01-29: 20 mg via INTRAVENOUS

## 2014-01-29 NOTE — Patient Instructions (Signed)
[facility] Discharge Instructions for Patients Receiving Chemotherapy  Today you received the following chemotherapy agent Carboplatin and Etoposide (vp-16).  To help prevent nausea and vomiting after your treatment, we encourage you to take your nausea medication as directed by your doctor.   If you develop nausea and vomiting that is not controlled by your nausea medication, call the clinic.   BELOW ARE SYMPTOMS THAT SHOULD BE REPORTED IMMEDIATELY:  *FEVER GREATER THAN 100.5 F  *CHILLS WITH OR WITHOUT FEVER  NAUSEA AND VOMITING THAT IS NOT CONTROLLED WITH YOUR NAUSEA MEDICATION  *UNUSUAL SHORTNESS OF BREATH  *UNUSUAL BRUISING OR BLEEDING  TENDERNESS IN MOUTH AND THROAT WITH OR WITHOUT PRESENCE OF ULCERS  *URINARY PROBLEMS  *BOWEL PROBLEMS  UNUSUAL RASH Items with * indicate a potential emergency and should be followed up as soon as possible.  Feel free to call the clinic you have any questions or concerns. The clinic phone number is [phone].

## 2014-01-30 ENCOUNTER — Encounter: Payer: Self-pay | Admitting: Internal Medicine

## 2014-01-30 ENCOUNTER — Ambulatory Visit (HOSPITAL_BASED_OUTPATIENT_CLINIC_OR_DEPARTMENT_OTHER): Payer: 59

## 2014-01-30 ENCOUNTER — Ambulatory Visit (HOSPITAL_BASED_OUTPATIENT_CLINIC_OR_DEPARTMENT_OTHER): Payer: 59 | Admitting: Internal Medicine

## 2014-01-30 VITALS — BP 145/86 | HR 92 | Temp 98.4°F | Resp 18 | Ht 70.0 in | Wt 192.0 lb

## 2014-01-30 DIAGNOSIS — C3410 Malignant neoplasm of upper lobe, unspecified bronchus or lung: Secondary | ICD-10-CM

## 2014-01-30 DIAGNOSIS — Z5111 Encounter for antineoplastic chemotherapy: Secondary | ICD-10-CM

## 2014-01-30 DIAGNOSIS — C3490 Malignant neoplasm of unspecified part of unspecified bronchus or lung: Secondary | ICD-10-CM

## 2014-01-30 DIAGNOSIS — C3491 Malignant neoplasm of unspecified part of right bronchus or lung: Secondary | ICD-10-CM

## 2014-01-30 MED ORDER — SODIUM CHLORIDE 0.9 % IJ SOLN
10.0000 mL | INTRAMUSCULAR | Status: AC | PRN
  Administered 2014-01-30: 10 mL
  Filled 2014-01-30: qty 10

## 2014-01-30 MED ORDER — PROCHLORPERAZINE EDISYLATE 5 MG/ML IJ SOLN
INTRAMUSCULAR | Status: AC
Start: 2014-01-30 — End: 2014-01-30
  Filled 2014-01-30: qty 2

## 2014-01-30 MED ORDER — SODIUM CHLORIDE 0.9 % IV SOLN
Freq: Once | INTRAVENOUS | Status: AC
  Administered 2014-01-30: 13:00:00 via INTRAVENOUS

## 2014-01-30 MED ORDER — HEPARIN SOD (PORK) LOCK FLUSH 100 UNIT/ML IV SOLN
500.0000 [IU] | Freq: Once | INTRAVENOUS | Status: AC | PRN
  Administered 2014-01-30: 500 [IU]
  Filled 2014-01-30: qty 5

## 2014-01-30 MED ORDER — PROCHLORPERAZINE MALEATE 10 MG PO TABS: 10.0000 mg | ORAL_TABLET | Freq: Once | ORAL | Status: DC

## 2014-01-30 MED ORDER — PROCHLORPERAZINE EDISYLATE 5 MG/ML IJ SOLN
10.0000 mg | Freq: Once | INTRAMUSCULAR | Status: AC
  Administered 2014-01-30: 10 mg via INTRAVENOUS

## 2014-01-30 MED ORDER — SODIUM CHLORIDE 0.9 % IV SOLN
100.0000 mg/m2 | Freq: Once | INTRAVENOUS | Status: AC
  Administered 2014-01-30: 210 mg via INTRAVENOUS
  Filled 2014-01-30: qty 10.5

## 2014-01-30 NOTE — Progress Notes (Signed)
[facility] [phone]   [phone]  OFFICE PROGRESS NOTE  Irven Shelling, MD [employer], [address] [place] [address]  DIAGNOSIS: Small cell carcinoma of lung, Extensive stage  Primary site: Lung (Right)  Staging method: AJCC 7th Edition  Clinical: Stage IV (T1a, N1, M1b) signed by Curt Bears, MD on 12/07/2013 3:19 PM  Summary: Stage IV (T1a, N1, M1b)   PRIOR THERAPY: none   CURRENT THERAPY: Systemic chemotherapy with carboplatin for AUC of 5 given on day 1, etoposide 100 mg/m2 given on days 1, 2 an3 with neulast support on day 4. Status post 2 cycles.   DISEASE STAGE:  Small cell carcinoma of lung, Extensive stage  Primary site: Lung (Right)  Staging method: AJCC 7th Edition  Clinical: Stage IV (T1a, N1, M1b) signed by Curt Bears, MD on 12/07/2013 3:19 PM  Summary: Stage IV (T1a, N1, M1b)  CHEMOTHERAPY INTENT: pallative  CURRENT # OF CHEMOTHERAPY CYCLES: 3 CURRENT ANTIEMETICS: Zofran, dexamethasone and compazine  CURRENT SMOKING STATUS: Former smoker, quit 12/18/2013  ORAL CHEMOTHERAPY AND CONSENT: n/a  CURRENT BISPHOSPHONATES USE: none  PAIN MANAGEMENT: Lortab 5/325 mg  NARCOTICS INDUCED CONSTIPATION: none  LIVING WILL AND CODE STATUS: ?   INTERVAL HISTORY: Jaime Morris 53 y.o. male returns to the clinic today for followup visit accompanied by a friend. The patient tolerated the second cycle of his systemic chemotherapy with carboplatin and etoposide fairly well with no significant adverse effects except for one episode of nausea resolved spontaneously and the patient did not take his nausea medicine. He denied having any significant fever or chills, or vomiting. He denied having any significant weight loss or night sweats. He has no chest pain, shortness of breath, cough or hemoptysis. He started cycle #3 of his chemotherapy yesterday.  MEDICAL HISTORY: Past Medical History  Diagnosis Date  . HIV  infection     DR. [name]  . Hyperlipidemia   . Insomnia   . Lung nodule, multiple 10/24/13    CT CHEST W/O...DR. [name]  . Gynecomastia, male 10/16/13    MILD RIGHT SIDED PER RADIOLOGY STUDY  . COPD (chronic obstructive pulmonary disease)     PFT'S 10/26/13 @ [facility]  . History of depression   . History of pneumonia   . Small cell lung cancer   . Collapsed lung     d/t biopsy, right    ALLERGIES:  has No Known Allergies.  MEDICATIONS:  Current Outpatient Prescriptions  Medication Sig Dispense Refill  . albuterol (PROAIR HFA) 108 (90 BASE) MCG/ACT inhaler Inhale 2 puffs into the lungs every 4 (four) hours as needed for wheezing or shortness of breath.       . doxylamine, Sleep, (UNISOM) 25 MG tablet Take 50 mg by mouth at bedtime as needed for sleep.       Marland Kitchen efavirenz-emtricitabine-tenofovir (ATRIPLA) 600-200-300 MG per tablet Take 1 tablet by mouth daily.  90 tablet  3  . lidocaine-prilocaine (EMLA) cream Apply 1 application topically as needed. Apply to port 1 hour before chemo appointment.  30 g  0  . Melatonin 10 MG TABS Take 10 mg by mouth at bedtime as needed (sleep).      . traZODone (DESYREL) 100 MG tablet Take 100 mg by mouth at bedtime.      . varenicline (CHANTIX) 1 MG tablet Take 1 mg by mouth 2 (two) times daily.       . fluticasone (FLONASE) 50 MCG/ACT  nasal spray Place 2 sprays into both nostrils daily as needed for allergies.       Marland Kitchen HYDROcodone-acetaminophen (LORTAB) 5-325 MG per tablet Take 1-2 tablets by mouth every 6 (six) hours as needed for moderate pain.  30 tablet  0  . OVER THE COUNTER MEDICATION daily. Essential Oils      . prochlorperazine (COMPAZINE) 10 MG tablet Take 1 tablet (10 mg total) by mouth every 6 (six) hours as needed for nausea or vomiting.  30 tablet  1   No current facility-administered medications for this visit.    SURGICAL HISTORY:  Past Surgical History  Procedure Laterality Date  . Foot surgery      BUNIONECTOMY  . Back  surgery      L 4-5 FUSION WITH SCREW  . Colonoscopy  UTD  . Portacath placement N/A 12/08/2013    Procedure: INSERTION PORT-A-CATH;  Surgeon: Grace Isaac, MD;  Location: [facility] OR;  Service: Thoracic;  Laterality: N/A;    REVIEW OF SYSTEMS:  Constitutional: negative Eyes: negative Ears, nose, mouth, throat, and face: negative Respiratory: negative Cardiovascular: negative Gastrointestinal: negative Genitourinary:negative Integument/breast: negative Hematologic/lymphatic: negative Musculoskeletal:negative Neurological: negative Behavioral/Psych: negative Endocrine: negative Allergic/Immunologic: negative   PHYSICAL EXAMINATION: General appearance: alert, cooperative, fatigued and no distress Head: Normocephalic, without obvious abnormality, atraumatic Neck: no adenopathy, no JVD, supple, symmetrical, trachea midline and thyroid not enlarged, symmetric, no tenderness/mass/nodules Lymph nodes: Cervical, supraclavicular, and axillary nodes normal. Resp: clear to auscultation bilaterally Back: symmetric, no curvature. ROM normal. No CVA tenderness. Cardio: regular rate and rhythm, S1, S2 normal, no murmur, click, rub or gallop GI: soft, non-tender; bowel sounds normal; no masses,  no organomegaly Extremities: extremities normal, atraumatic, no cyanosis or edema Neurologic: Alert and oriented X 3, normal strength and tone. Normal symmetric reflexes. Normal coordination and gait  ECOG PERFORMANCE STATUS: 1 - Symptomatic but completely ambulatory  Blood pressure 145/86, pulse 92, temperature 98.4 F (36.9 C), temperature source Oral, resp. rate 18, height 5\' 10"  (1.778 m), weight 192 lb (87.091 kg), SpO2 100.00%.  LABORATORY DATA: Lab Results  Component Value Date   WBC 10.2 01/29/2014   HGB 13.5 01/29/2014   HCT 40.6 01/29/2014   MCV 88.7 01/29/2014   PLT 294 01/29/2014      Chemistry      Component Value Date/Time   NA 138 01/29/2014 1058   NA 134* 01/22/2014 0955   K 4.5  01/29/2014 1058   K 4.2 01/22/2014 0955   CL 102 01/22/2014 0955   CO2 25 01/29/2014 1058   CO2 23 01/22/2014 0955   BUN 12.7 01/29/2014 1058   BUN 12 01/22/2014 0955   CREATININE 0.9 01/29/2014 1058   CREATININE 0.97 01/22/2014 0955   CREATININE 0.79 10/23/2013 1112      Component Value Date/Time   CALCIUM 9.4 01/29/2014 1058   CALCIUM 9.6 01/22/2014 0955   ALKPHOS 104 01/29/2014 1058   ALKPHOS 138* 01/22/2014 0955   AST 24 01/29/2014 1058   AST 23 01/22/2014 0955   ALT 24 01/29/2014 1058   ALT 28 01/22/2014 0955   BILITOT 0.25 01/29/2014 1058   BILITOT 0.5 01/22/2014 0955       RADIOGRAPHIC STUDIES: No results found.  ASSESSMENT AND PLAN:  This is a very pleasant 53 years old African American male recently diagnosed with extensive stage small cell lung cancer and currently undergoing systemic chemotherapy with carboplatin and etoposide status post 2 cycles. The patient tolerated the first 2 cycles of his treatment fairly  well with no significant adverse effects. I recommended for him to proceed with cycle #3 as scheduled.  I will delay the staging workup until completion of cycle #3 with repeat CT scan of the chest, abdomen and pelvis for restaging of his disease. The patient had several questions today about his condition and he is also considering a second opinion at some point after completion of his systemic therapy. He had several questions today and I answered them completely to his satisfaction. He was advised to call immediately if he has any concerning symptoms in the interval. The patient voices understanding of current disease status and treatment options and is in agreement with the current care plan.  All questions were answered. The patient knows to call the clinic with any problems, questions or concerns. We can certainly see the patient much sooner if necessary. I spent 15 minutes of face-to-face counseling with the patient out of the total visit time 25 minutes.  Disclaimer:  This note was dictated with voice recognition software. Similar sounding words can inadvertently be transcribed and may not be corrected upon review.

## 2014-01-30 NOTE — Patient Instructions (Signed)
[facility] Discharge Instructions for Patients Receiving Chemotherapy  Today you received the following chemotherapy agents Etopeside(VP-16)  To help prevent nausea and vomiting after your treatment, we encourage you to take your nausea medication Compazine10mg  as ordered or 30 minutes before meals andat bedtime if needed.   If you develop nausea and vomiting that is not controlled by your nausea medication, call the clinic.   BELOW ARE SYMPTOMS THAT SHOULD BE REPORTED IMMEDIATELY:  *FEVER GREATER THAN 100.5 F  *CHILLS WITH OR WITHOUT FEVER  NAUSEA AND VOMITING THAT IS NOT CONTROLLED WITH YOUR NAUSEA MEDICATION  *UNUSUAL SHORTNESS OF BREATH  *UNUSUAL BRUISING OR BLEEDING  TENDERNESS IN MOUTH AND THROAT WITH OR WITHOUT PRESENCE OF ULCERS  *URINARY PROBLEMS  *BOWEL PROBLEMS  UNUSUAL RASH Items with * indicate a potential emergency and should be followed up as soon as possible.  Feel free to call the clinic you have any questions or concerns. The clinic phone number is [phone].

## 2014-01-31 ENCOUNTER — Telehealth: Payer: Self-pay | Admitting: Internal Medicine

## 2014-01-31 ENCOUNTER — Telehealth: Payer: Self-pay | Admitting: *Deleted

## 2014-01-31 ENCOUNTER — Ambulatory Visit (HOSPITAL_BASED_OUTPATIENT_CLINIC_OR_DEPARTMENT_OTHER): Payer: 59

## 2014-01-31 VITALS — BP 125/76 | HR 90 | Temp 97.9°F | Resp 18

## 2014-01-31 DIAGNOSIS — C3491 Malignant neoplasm of unspecified part of right bronchus or lung: Secondary | ICD-10-CM

## 2014-01-31 DIAGNOSIS — Z5111 Encounter for antineoplastic chemotherapy: Secondary | ICD-10-CM

## 2014-01-31 DIAGNOSIS — C3410 Malignant neoplasm of upper lobe, unspecified bronchus or lung: Secondary | ICD-10-CM

## 2014-01-31 MED ORDER — PROCHLORPERAZINE MALEATE 10 MG PO TABS
10.0000 mg | ORAL_TABLET | Freq: Once | ORAL | Status: AC
  Administered 2014-01-31: 10 mg via ORAL

## 2014-01-31 MED ORDER — SODIUM CHLORIDE 0.9 % IV SOLN
100.0000 mg/m2 | Freq: Once | INTRAVENOUS | Status: AC
  Administered 2014-01-31: 210 mg via INTRAVENOUS
  Filled 2014-01-31: qty 10.5

## 2014-01-31 MED ORDER — HEPARIN SOD (PORK) LOCK FLUSH 100 UNIT/ML IV SOLN
500.0000 [IU] | Freq: Once | INTRAVENOUS | Status: AC | PRN
  Administered 2014-01-31: 500 [IU]
  Filled 2014-01-31: qty 5

## 2014-01-31 MED ORDER — PROCHLORPERAZINE MALEATE 10 MG PO TABS
ORAL_TABLET | ORAL | Status: AC
  Filled 2014-01-31: qty 1

## 2014-01-31 MED ORDER — SODIUM CHLORIDE 0.9 % IJ SOLN
10.0000 mL | INTRAMUSCULAR | Status: DC | PRN
  Administered 2014-01-31: 10 mL
  Filled 2014-01-31: qty 10

## 2014-01-31 MED ORDER — SODIUM CHLORIDE 0.9 % IV SOLN
Freq: Once | INTRAVENOUS | Status: AC
  Administered 2014-01-31: 12:00:00 via INTRAVENOUS

## 2014-01-31 NOTE — Telephone Encounter (Signed)
gv adn printed appt sched and avs for pt for Aug adn SEpt .Marland Kitchen..sed added tx.

## 2014-01-31 NOTE — Telephone Encounter (Signed)
Per staff message and POF I have scheduled appts. Advised scheduler of appts. JMW  

## 2014-01-31 NOTE — Patient Instructions (Signed)
[facility] Discharge Instructions for Patients Receiving Chemotherapy  Today you received the following chemotherapy agents Etoposide.  To help prevent nausea and vomiting after your treatment, we encourage you to take your nausea medication.   If you develop nausea and vomiting that is not controlled by your nausea medication, call the clinic.   BELOW ARE SYMPTOMS THAT SHOULD BE REPORTED IMMEDIATELY:  *FEVER GREATER THAN 100.5 F  *CHILLS WITH OR WITHOUT FEVER  NAUSEA AND VOMITING THAT IS NOT CONTROLLED WITH YOUR NAUSEA MEDICATION  *UNUSUAL SHORTNESS OF BREATH  *UNUSUAL BRUISING OR BLEEDING  TENDERNESS IN MOUTH AND THROAT WITH OR WITHOUT PRESENCE OF ULCERS  *URINARY PROBLEMS  *BOWEL PROBLEMS  UNUSUAL RASH Items with * indicate a potential emergency and should be followed up as soon as possible.  Feel free to call the clinic you have any questions or concerns. The clinic phone number is [phone].

## 2014-02-01 ENCOUNTER — Ambulatory Visit: Payer: 59

## 2014-02-01 ENCOUNTER — Ambulatory Visit (HOSPITAL_BASED_OUTPATIENT_CLINIC_OR_DEPARTMENT_OTHER): Payer: 59

## 2014-02-01 VITALS — BP 151/75 | HR 94 | Temp 98.2°F

## 2014-02-01 DIAGNOSIS — C3410 Malignant neoplasm of upper lobe, unspecified bronchus or lung: Secondary | ICD-10-CM

## 2014-02-01 DIAGNOSIS — C3491 Malignant neoplasm of unspecified part of right bronchus or lung: Secondary | ICD-10-CM

## 2014-02-01 DIAGNOSIS — Z5189 Encounter for other specified aftercare: Secondary | ICD-10-CM

## 2014-02-01 MED ORDER — PEGFILGRASTIM INJECTION 6 MG/0.6ML
6.0000 mg | Freq: Once | SUBCUTANEOUS | Status: AC
  Administered 2014-02-01: 6 mg via SUBCUTANEOUS
  Filled 2014-02-01: qty 0.6

## 2014-02-01 NOTE — Patient Instructions (Signed)
Pegfilgrastim injection What is this medicine? PEGFILGRASTIM (peg fil GRA stim) is a long-acting granulocyte colony-stimulating factor that stimulates the growth of neutrophils, a type of white blood cell important in the body's fight against infection. It is used to reduce the incidence of fever and infection in patients with certain types of cancer who are receiving chemotherapy that affects the bone marrow. This medicine may be used for other purposes; ask your health care provider or pharmacist if you have questions. COMMON BRAND NAME(S): Neulasta What should I tell my health care provider before I take this medicine? They need to know if you have any of these conditions: -latex allergy -ongoing radiation therapy -sickle cell disease -skin reactions to acrylic adhesives (On-Body Injector only) -an unusual or allergic reaction to pegfilgrastim, filgrastim, other medicines, foods, dyes, or preservatives -pregnant or trying to get pregnant -breast-feeding How should I use this medicine? This medicine is for injection under the skin. If you get this medicine at home, you will be taught how to prepare and give the pre-filled syringe or how to use the On-body Injector. Refer to the patient Instructions for Use for detailed instructions. Use exactly as directed. Take your medicine at regular intervals. Do not take your medicine more often than directed. It is important that you put your used needles and syringes in a special sharps container. Do not put them in a trash can. If you do not have a sharps container, call your pharmacist or healthcare provider to get one. Talk to your pediatrician regarding the use of this medicine in children. Special care may be needed. Overdosage: If you think you have taken too much of this medicine contact a poison control center or emergency room at once. NOTE: This medicine is only for you. Do not share this medicine with others. What if I miss a dose? It is  important not to miss your dose. Call your doctor or health care professional if you miss your dose. If you miss a dose due to an On-body Injector failure or leakage, a new dose should be administered as soon as possible using a single prefilled syringe for manual use. What may interact with this medicine? Interactions have not been studied. Give your health care provider a list of all the medicines, herbs, non-prescription drugs, or dietary supplements you use. Also tell them if you smoke, drink alcohol, or use illegal drugs. Some items may interact with your medicine. This list may not describe all possible interactions. Give your health care provider a list of all the medicines, herbs, non-prescription drugs, or dietary supplements you use. Also tell them if you smoke, drink alcohol, or use illegal drugs. Some items may interact with your medicine. What should I watch for while using this medicine? You may need blood work done while you are taking this medicine. If you are going to need a MRI, CT scan, or other procedure, tell your doctor that you are using this medicine (On-Body Injector only). What side effects may I notice from receiving this medicine? Side effects that you should report to your doctor or health care professional as soon as possible: -allergic reactions like skin rash, itching or hives, swelling of the face, lips, or tongue -dizziness -fever -pain, redness, or irritation at site where injected -pinpoint red spots on the skin -shortness of breath or breathing problems -stomach or side pain, or pain at the shoulder -swelling -tiredness -trouble passing urine Side effects that usually do not require medical attention (report to your doctor   or health care professional if they continue or are bothersome): -bone pain -muscle pain This list may not describe all possible side effects. Call your doctor for medical advice about side effects. You may report side effects to FDA at  1-800-FDA-1088. Where should I keep my medicine? Keep out of the reach of children. Store pre-filled syringes in a refrigerator between 2 and 8 degrees C (36 and 46 degrees F). Do not freeze. Keep in carton to protect from light. Throw away this medicine if it is left out of the refrigerator for more than 48 hours. Throw away any unused medicine after the expiration date. NOTE: This sheet is a summary. It may not cover all possible information. If you have questions about this medicine, talk to your doctor, pharmacist, or health care provider.  2015, [employer]vier/Gold Standard. (2013-08-31 16:14:05)  

## 2014-02-05 ENCOUNTER — Other Ambulatory Visit: Payer: 59

## 2014-02-06 ENCOUNTER — Other Ambulatory Visit (HOSPITAL_BASED_OUTPATIENT_CLINIC_OR_DEPARTMENT_OTHER): Payer: 59

## 2014-02-06 DIAGNOSIS — C3410 Malignant neoplasm of upper lobe, unspecified bronchus or lung: Secondary | ICD-10-CM

## 2014-02-06 DIAGNOSIS — C3491 Malignant neoplasm of unspecified part of right bronchus or lung: Secondary | ICD-10-CM

## 2014-02-06 LAB — COMPREHENSIVE METABOLIC PANEL (CC13)
ALT: 17 U/L (ref 0–55)
ANION GAP: 6 meq/L (ref 3–11)
AST: 16 U/L (ref 5–34)
Albumin: 3.7 g/dL (ref 3.5–5.0)
Alkaline Phosphatase: 158 U/L — ABNORMAL HIGH (ref 40–150)
BUN: 12.7 mg/dL (ref 7.0–26.0)
CALCIUM: 8.8 mg/dL (ref 8.4–10.4)
CHLORIDE: 107 meq/L (ref 98–109)
CO2: 23 meq/L (ref 22–29)
Creatinine: 0.8 mg/dL (ref 0.7–1.3)
Glucose: 108 mg/dl (ref 70–140)
Potassium: 4.3 mEq/L (ref 3.5–5.1)
Sodium: 137 mEq/L (ref 136–145)
Total Bilirubin: 0.25 mg/dL (ref 0.20–1.20)
Total Protein: 7 g/dL (ref 6.4–8.3)

## 2014-02-06 LAB — CBC WITH DIFFERENTIAL/PLATELET
BASO%: 0.5 % (ref 0.0–2.0)
BASOS ABS: 0.1 10*3/uL (ref 0.0–0.1)
EOS ABS: 0.1 10*3/uL (ref 0.0–0.5)
EOS%: 0.6 % (ref 0.0–7.0)
HEMATOCRIT: 35.9 % — AB (ref 38.4–49.9)
HEMOGLOBIN: 11.9 g/dL — AB (ref 13.0–17.1)
LYMPH#: 2.7 10*3/uL (ref 0.9–3.3)
LYMPH%: 15.1 % (ref 14.0–49.0)
MCH: 29.8 pg (ref 27.2–33.4)
MCHC: 33 g/dL (ref 32.0–36.0)
MCV: 90.3 fL (ref 79.3–98.0)
MONO#: 1.4 10*3/uL — AB (ref 0.1–0.9)
MONO%: 8 % (ref 0.0–14.0)
NEUT%: 75.8 % — ABNORMAL HIGH (ref 39.0–75.0)
NEUTROS ABS: 13.4 10*3/uL — AB (ref 1.5–6.5)
Platelets: 252 10*3/uL (ref 140–400)
RBC: 3.98 10*6/uL — ABNORMAL LOW (ref 4.20–5.82)
RDW: 16.8 % — ABNORMAL HIGH (ref 11.0–14.6)
WBC: 17.7 10*3/uL — AB (ref 4.0–10.3)

## 2014-02-12 ENCOUNTER — Other Ambulatory Visit (HOSPITAL_BASED_OUTPATIENT_CLINIC_OR_DEPARTMENT_OTHER): Payer: 59

## 2014-02-12 DIAGNOSIS — C3410 Malignant neoplasm of upper lobe, unspecified bronchus or lung: Secondary | ICD-10-CM

## 2014-02-12 DIAGNOSIS — C3491 Malignant neoplasm of unspecified part of right bronchus or lung: Secondary | ICD-10-CM

## 2014-02-12 LAB — CBC WITH DIFFERENTIAL/PLATELET
BASO%: 0.2 % (ref 0.0–2.0)
BASOS ABS: 0 10*3/uL (ref 0.0–0.1)
EOS%: 0.4 % (ref 0.0–7.0)
Eosinophils Absolute: 0.1 10*3/uL (ref 0.0–0.5)
HCT: 39 % (ref 38.4–49.9)
HEMOGLOBIN: 13 g/dL (ref 13.0–17.1)
LYMPH#: 2 10*3/uL (ref 0.9–3.3)
LYMPH%: 16.4 % (ref 14.0–49.0)
MCH: 30.7 pg (ref 27.2–33.4)
MCHC: 33.3 g/dL (ref 32.0–36.0)
MCV: 92.2 fL (ref 79.3–98.0)
MONO#: 0.5 10*3/uL (ref 0.1–0.9)
MONO%: 4.3 % (ref 0.0–14.0)
NEUT%: 78.7 % — ABNORMAL HIGH (ref 39.0–75.0)
NEUTROS ABS: 9.5 10*3/uL — AB (ref 1.5–6.5)
Platelets: 100 10*3/uL — ABNORMAL LOW (ref 140–400)
RBC: 4.23 10*6/uL (ref 4.20–5.82)
RDW: 18.8 % — ABNORMAL HIGH (ref 11.0–14.6)
WBC: 12.1 10*3/uL — ABNORMAL HIGH (ref 4.0–10.3)

## 2014-02-12 LAB — COMPREHENSIVE METABOLIC PANEL (CC13)
ALBUMIN: 3.7 g/dL (ref 3.5–5.0)
ALT: 25 U/L (ref 0–55)
AST: 22 U/L (ref 5–34)
Alkaline Phosphatase: 156 U/L — ABNORMAL HIGH (ref 40–150)
Anion Gap: 8 mEq/L (ref 3–11)
BUN: 6.9 mg/dL — AB (ref 7.0–26.0)
CHLORIDE: 107 meq/L (ref 98–109)
CO2: 26 mEq/L (ref 22–29)
Calcium: 9.5 mg/dL (ref 8.4–10.4)
Creatinine: 0.9 mg/dL (ref 0.7–1.3)
Glucose: 116 mg/dl (ref 70–140)
Potassium: 4.5 mEq/L (ref 3.5–5.1)
SODIUM: 140 meq/L (ref 136–145)
TOTAL PROTEIN: 7.5 g/dL (ref 6.4–8.3)
Total Bilirubin: 0.54 mg/dL (ref 0.20–1.20)

## 2014-02-15 ENCOUNTER — Encounter: Payer: Self-pay | Admitting: Internal Medicine

## 2014-02-15 ENCOUNTER — Telehealth: Payer: Self-pay | Admitting: Nurse Practitioner

## 2014-02-15 NOTE — Telephone Encounter (Signed)
Pt moved from NP/LT to NP/CB per 09/03 POF, D/T same...Marland KitchenMarland KitchenKJ

## 2014-02-15 NOTE — Progress Notes (Signed)
Called and spoke with patient about asst with his mortgage. Per acctg they reissued check and will send again. The last one was never cashed. I let patient know.

## 2014-02-16 ENCOUNTER — Ambulatory Visit (HOSPITAL_COMMUNITY)
Admission: RE | Admit: 2014-02-16 | Discharge: 2014-02-16 | Disposition: A | Discharge status: Home / Self Care | Payer: 59 | Source: Ambulatory Visit | Attending: Diagnostic Radiology | Admitting: Diagnostic Radiology

## 2014-02-16 ENCOUNTER — Encounter (HOSPITAL_COMMUNITY): Payer: Self-pay

## 2014-02-16 DIAGNOSIS — C3490 Malignant neoplasm of unspecified part of unspecified bronchus or lung: Secondary | ICD-10-CM | POA: Diagnosis not present

## 2014-02-16 DIAGNOSIS — R599 Enlarged lymph nodes, unspecified: Secondary | ICD-10-CM | POA: Diagnosis not present

## 2014-02-16 DIAGNOSIS — J984 Other disorders of lung: Secondary | ICD-10-CM | POA: Diagnosis not present

## 2014-02-16 MED ORDER — IOHEXOL 300 MG/ML  SOLN
100.0000 mL | Freq: Once | INTRAMUSCULAR | Status: AC | PRN
  Administered 2014-02-16: 100 mL via INTRAVENOUS

## 2014-02-20 ENCOUNTER — Ambulatory Visit: Payer: 59 | Admitting: Physician Assistant

## 2014-02-20 ENCOUNTER — Ambulatory Visit (HOSPITAL_BASED_OUTPATIENT_CLINIC_OR_DEPARTMENT_OTHER): Payer: 59 | Admitting: Nurse Practitioner

## 2014-02-20 ENCOUNTER — Other Ambulatory Visit (HOSPITAL_BASED_OUTPATIENT_CLINIC_OR_DEPARTMENT_OTHER): Payer: 59

## 2014-02-20 ENCOUNTER — Ambulatory Visit (HOSPITAL_BASED_OUTPATIENT_CLINIC_OR_DEPARTMENT_OTHER): Payer: 59

## 2014-02-20 ENCOUNTER — Other Ambulatory Visit: Payer: 59

## 2014-02-20 VITALS — BP 127/81 | HR 81 | Temp 98.2°F | Resp 18 | Wt 194.3 lb

## 2014-02-20 DIAGNOSIS — C3410 Malignant neoplasm of upper lobe, unspecified bronchus or lung: Secondary | ICD-10-CM

## 2014-02-20 DIAGNOSIS — Z5111 Encounter for antineoplastic chemotherapy: Secondary | ICD-10-CM

## 2014-02-20 DIAGNOSIS — R252 Cramp and spasm: Secondary | ICD-10-CM

## 2014-02-20 DIAGNOSIS — C3490 Malignant neoplasm of unspecified part of unspecified bronchus or lung: Secondary | ICD-10-CM

## 2014-02-20 DIAGNOSIS — Z95828 Presence of other vascular implants and grafts: Secondary | ICD-10-CM

## 2014-02-20 DIAGNOSIS — Z23 Encounter for immunization: Secondary | ICD-10-CM

## 2014-02-20 DIAGNOSIS — C3491 Malignant neoplasm of unspecified part of right bronchus or lung: Secondary | ICD-10-CM

## 2014-02-20 LAB — COMPREHENSIVE METABOLIC PANEL (CC13)
ALBUMIN: 3.6 g/dL (ref 3.5–5.0)
ALT: 22 U/L (ref 0–55)
ANION GAP: 8 meq/L (ref 3–11)
AST: 22 U/L (ref 5–34)
Alkaline Phosphatase: 107 U/L (ref 40–150)
BUN: 10.6 mg/dL (ref 7.0–26.0)
CO2: 22 mEq/L (ref 22–29)
Calcium: 9.3 mg/dL (ref 8.4–10.4)
Chloride: 108 mEq/L (ref 98–109)
Creatinine: 0.9 mg/dL (ref 0.7–1.3)
GLUCOSE: 142 mg/dL — AB (ref 70–140)
POTASSIUM: 4.5 meq/L (ref 3.5–5.1)
SODIUM: 138 meq/L (ref 136–145)
TOTAL PROTEIN: 7.4 g/dL (ref 6.4–8.3)
Total Bilirubin: 0.23 mg/dL (ref 0.20–1.20)

## 2014-02-20 LAB — CBC WITH DIFFERENTIAL/PLATELET
BASO%: 0.4 % (ref 0.0–2.0)
Basophils Absolute: 0 10*3/uL (ref 0.0–0.1)
EOS ABS: 0.1 10*3/uL (ref 0.0–0.5)
EOS%: 1.3 % (ref 0.0–7.0)
HCT: 39 % (ref 38.4–49.9)
HGB: 13 g/dL (ref 13.0–17.1)
LYMPH%: 26.5 % (ref 14.0–49.0)
MCH: 31.1 pg (ref 27.2–33.4)
MCHC: 33.3 g/dL (ref 32.0–36.0)
MCV: 93.3 fL (ref 79.3–98.0)
MONO#: 0.6 10*3/uL (ref 0.1–0.9)
MONO%: 8 % (ref 0.0–14.0)
NEUT%: 63.8 % (ref 39.0–75.0)
NEUTROS ABS: 4.8 10*3/uL (ref 1.5–6.5)
PLATELETS: 320 10*3/uL (ref 140–400)
RBC: 4.18 10*6/uL — AB (ref 4.20–5.82)
RDW: 18.4 % — AB (ref 11.0–14.6)
WBC: 7.5 10*3/uL (ref 4.0–10.3)
lymph#: 2 10*3/uL (ref 0.9–3.3)

## 2014-02-20 MED ORDER — DEXAMETHASONE SODIUM PHOSPHATE 20 MG/5ML IJ SOLN
20.0000 mg | Freq: Once | INTRAMUSCULAR | Status: AC
  Administered 2014-02-20: 20 mg via INTRAVENOUS

## 2014-02-20 MED ORDER — DEXAMETHASONE SODIUM PHOSPHATE 20 MG/5ML IJ SOLN
INTRAMUSCULAR | Status: AC
  Filled 2014-02-20: qty 5

## 2014-02-20 MED ORDER — SODIUM CHLORIDE 0.9 % IJ SOLN
10.0000 mL | INTRAMUSCULAR | Status: DC | PRN
  Administered 2014-02-20: 10 mL
  Filled 2014-02-20: qty 10

## 2014-02-20 MED ORDER — SODIUM CHLORIDE 0.9 % IV SOLN
687.0000 mg | Freq: Once | INTRAVENOUS | Status: AC
  Administered 2014-02-20: 690 mg via INTRAVENOUS
  Filled 2014-02-20: qty 69

## 2014-02-20 MED ORDER — ONDANSETRON 16 MG/50ML IVPB (CHCC)
INTRAVENOUS | Status: AC
  Filled 2014-02-20: qty 16

## 2014-02-20 MED ORDER — SODIUM CHLORIDE 0.9 % IJ SOLN
10.0000 mL | INTRAMUSCULAR | Status: DC | PRN
  Administered 2014-02-20: 10 mL via INTRAVENOUS
  Filled 2014-02-20: qty 10

## 2014-02-20 MED ORDER — SODIUM CHLORIDE 0.9 % IV SOLN
Freq: Once | INTRAVENOUS | Status: AC
  Administered 2014-02-20: 13:00:00 via INTRAVENOUS

## 2014-02-20 MED ORDER — ONDANSETRON 16 MG/50ML IVPB (CHCC)
16.0000 mg | Freq: Once | INTRAVENOUS | Status: AC
  Administered 2014-02-20: 16 mg via INTRAVENOUS

## 2014-02-20 MED ORDER — INFLUENZA VAC SPLIT QUAD 0.5 ML IM SUSY
0.5000 mL | PREFILLED_SYRINGE | Freq: Once | INTRAMUSCULAR | Status: AC
  Administered 2014-02-20: 0.5 mL via INTRAMUSCULAR
  Filled 2014-02-20: qty 0.5

## 2014-02-20 MED ORDER — HEPARIN SOD (PORK) LOCK FLUSH 100 UNIT/ML IV SOLN
500.0000 [IU] | Freq: Once | INTRAVENOUS | Status: AC | PRN
  Administered 2014-02-20: 500 [IU]
  Filled 2014-02-20: qty 5

## 2014-02-20 MED ORDER — SODIUM CHLORIDE 0.9 % IV SOLN
100.0000 mg/m2 | Freq: Once | INTRAVENOUS | Status: AC
  Administered 2014-02-20: 210 mg via INTRAVENOUS
  Filled 2014-02-20: qty 10.5

## 2014-02-20 NOTE — Patient Instructions (Signed)

## 2014-02-20 NOTE — Patient Instructions (Addendum)
[facility] Discharge Instructions for Patients Receiving Chemotherapy  Today you received the following chemotherapy agents: Carboplatin, Etoposide.   To help prevent nausea and vomiting after your treatment, we encourage you to take your nausea medication as directed.   If you develop nausea and vomiting that is not controlled by your nausea medication, call the clinic.   BELOW ARE SYMPTOMS THAT SHOULD BE REPORTED IMMEDIATELY:  *FEVER GREATER THAN 100.5 F  *CHILLS WITH OR WITHOUT FEVER  NAUSEA AND VOMITING THAT IS NOT CONTROLLED WITH YOUR NAUSEA MEDICATION  *UNUSUAL SHORTNESS OF BREATH  *UNUSUAL BRUISING OR BLEEDING  TENDERNESS IN MOUTH AND THROAT WITH OR WITHOUT PRESENCE OF ULCERS  *URINARY PROBLEMS  *BOWEL PROBLEMS  UNUSUAL RASH Items with * indicate a potential emergency and should be followed up as soon as possible.  Feel free to call the clinic you have any questions or concerns. The clinic phone number is [phone].

## 2014-02-21 ENCOUNTER — Ambulatory Visit (HOSPITAL_BASED_OUTPATIENT_CLINIC_OR_DEPARTMENT_OTHER): Payer: 59

## 2014-02-21 ENCOUNTER — Encounter: Payer: Self-pay | Admitting: Nurse Practitioner

## 2014-02-21 VITALS — BP 134/88 | HR 89 | Temp 99.6°F | Resp 18

## 2014-02-21 DIAGNOSIS — C3491 Malignant neoplasm of unspecified part of right bronchus or lung: Secondary | ICD-10-CM

## 2014-02-21 DIAGNOSIS — R252 Cramp and spasm: Secondary | ICD-10-CM | POA: Insufficient documentation

## 2014-02-21 DIAGNOSIS — C3410 Malignant neoplasm of upper lobe, unspecified bronchus or lung: Secondary | ICD-10-CM

## 2014-02-21 DIAGNOSIS — Z5111 Encounter for antineoplastic chemotherapy: Secondary | ICD-10-CM

## 2014-02-21 MED ORDER — SODIUM CHLORIDE 0.9 % IV SOLN
Freq: Once | INTRAVENOUS | Status: AC
  Administered 2014-02-21: 12:00:00 via INTRAVENOUS

## 2014-02-21 MED ORDER — SODIUM CHLORIDE 0.9 % IV SOLN
100.0000 mg/m2 | Freq: Once | INTRAVENOUS | Status: AC
  Administered 2014-02-21: 210 mg via INTRAVENOUS
  Filled 2014-02-21: qty 10.5

## 2014-02-21 MED ORDER — SODIUM CHLORIDE 0.9 % IJ SOLN
10.0000 mL | INTRAMUSCULAR | Status: DC | PRN
  Administered 2014-02-21: 10 mL
  Filled 2014-02-21: qty 10

## 2014-02-21 MED ORDER — PROCHLORPERAZINE MALEATE 10 MG PO TABS
ORAL_TABLET | ORAL | Status: AC
  Filled 2014-02-21: qty 1

## 2014-02-21 MED ORDER — PROCHLORPERAZINE MALEATE 10 MG PO TABS
10.0000 mg | ORAL_TABLET | Freq: Once | ORAL | Status: AC
  Administered 2014-02-21: 10 mg via ORAL

## 2014-02-21 MED ORDER — HEPARIN SOD (PORK) LOCK FLUSH 100 UNIT/ML IV SOLN
500.0000 [IU] | Freq: Once | INTRAVENOUS | Status: AC | PRN
  Administered 2014-02-21: 500 [IU]
  Filled 2014-02-21: qty 5

## 2014-02-21 NOTE — Assessment & Plan Note (Signed)
Patient's only major complaint at this time is some occasional, intermittent, mild cramping of his hands.  Most likely, this is related to his chemotherapy.  There were no electrolyte abnormalities up her lab results today.

## 2014-02-21 NOTE — Assessment & Plan Note (Signed)
Restaging CT of the chest/abdomen/pelvis revealed marked interval reduction in size of the posterior right lower lobe lesion.  Scattered tiny bilateral pulmonary nodules are unchanged in the interval.  Patient appears to be tolerating his chemotherapy regimen fairly well.  Carefully reviewed all of today's lab results with the patient in detail.  Patient will proceed today with cycle 4 of his carboplatin/etoposide chemotherapy regimen as previously directed.  He'll return on day 4 for his Neulasta injection for growth factor support.  He has plans to return on 03/13/2014 for cycle 5 of the same regimen.  He'll also continue to obtain a weekly labs.

## 2014-02-21 NOTE — Patient Instructions (Signed)
[facility] Discharge Instructions for Patients Receiving Chemotherapy  Today you received the following chemotherapy agents: etoposide  To help prevent nausea and vomiting after your treatment, we encourage you to take your nausea medication as prescribed.    If you develop nausea and vomiting that is not controlled by your nausea medication, call the clinic.   BELOW ARE SYMPTOMS THAT SHOULD BE REPORTED IMMEDIATELY:  *FEVER GREATER THAN 100.5 F  *CHILLS WITH OR WITHOUT FEVER  NAUSEA AND VOMITING THAT IS NOT CONTROLLED WITH YOUR NAUSEA MEDICATION  *UNUSUAL SHORTNESS OF BREATH  *UNUSUAL BRUISING OR BLEEDING  TENDERNESS IN MOUTH AND THROAT WITH OR WITHOUT PRESENCE OF ULCERS  *URINARY PROBLEMS  *BOWEL PROBLEMS  UNUSUAL RASH Items with * indicate a potential emergency and should be followed up as soon as possible.  Feel free to call the clinic you have any questions or concerns. The clinic phone number is [phone].

## 2014-02-21 NOTE — Progress Notes (Signed)
[facility]   Chief Complaint  Patient presents with  . Follow-up    HPI: Jaime Morris 53 y.o. male diagnosed with lung cancer.  Currently undergoing carboplatin/etoposide chemotherapy regimen.  Patient presents today for cycle 4 of his carboplatin/etoposide chemotherapy regimen.  He states he's been tolerating his chemotherapy regimen fairly well.  He denies any specific issues with either fatigue or appetite.  He denies any recent fever or chills.  His major complaint today is some mild, intermittent cramping of his bilateral hands.  He is requesting a nutrition consult at today as well.  He would also like to receive his influenza shot today.   HPI  CURRENT THERAPY: Upcoming Treatment Dates - LUNG SMALL CELL Carboplatin D1 / Etoposide D1-3 q21d Days with orders from any treatment category:  02/22/2014      SCHEDULING COMMUNICATION      prochlorperazine (COMPAZINE) tablet 10 mg      etoposide (VEPESID) 210 mg in sodium chloride 0.9 % 600 mL chemo infusion      sodium chloride 0.9 % injection 10 mL      heparin lock flush 100 unit/mL      heparin lock flush 100 unit/mL      alteplase (CATHFLO ACTIVASE) injection 2 mg      sodium chloride 0.9 % injection 3 mL      Hot Pack 1 packet      0.9 %  sodium chloride infusion 02/23/2014      SCHEDULING COMMUNICATION INJECTION      pegfilgrastim (NEULASTA) injection 6 mg 03/13/2014      SCHEDULING COMMUNICATION      ondansetron (ZOFRAN) IVPB 16 mg      Dexamethasone Sodium Phosphate (DECADRON) injection 20 mg      CARBOplatin (PARAPLATIN) in sodium chloride 0.9 % 100 mL chemo infusion      etoposide (VEPESID) 210 mg in sodium chloride 0.9 % 600 mL chemo infusion      sodium chloride 0.9 % injection 10 mL      heparin lock flush 100 unit/mL      heparin lock flush 100 unit/mL      alteplase (CATHFLO ACTIVASE) injection 2 mg      sodium chloride 0.9 % injection 3 mL      Hot Pack 1 packet      0.9 %  sodium  chloride infusion      TREATMENT CONDITIONS    Review of Systems  Constitutional: Negative for fever and chills.  HENT: Negative.   Eyes: Negative.   Respiratory: Negative.   Cardiovascular: Negative.   Gastrointestinal: Negative.   Genitourinary: Negative.   Musculoskeletal: Negative for back pain, falls, joint pain and neck pain.       Complaint of cramping of bilateral hands on occasion.  Skin: Negative.   Neurological: Negative.   Endo/Heme/Allergies: Negative.   Psychiatric/Behavioral: Negative.   All other systems reviewed and are negative.   Past Medical History  Diagnosis Date  . HIV infection     DR. [name]  . Hyperlipidemia   . Insomnia   . Lung nodule, multiple 10/24/13    CT CHEST W/O...DR. [name]  . Gynecomastia, male 10/16/13    MILD RIGHT SIDED PER RADIOLOGY STUDY  . COPD (chronic obstructive pulmonary disease)     PFT'S 10/26/13 @   . History of depression   . History of pneumonia   . Small cell lung cancer   . Collapsed lung  d/t biopsy, right    Past Surgical History  Procedure Laterality Date  . Foot surgery      BUNIONECTOMY  . Back surgery      L 4-5 FUSION WITH SCREW  . Colonoscopy  UTD  . Portacath placement N/A 12/08/2013    Procedure: INSERTION PORT-A-CATH;  Surgeon: Grace Isaac, MD;  Location: [facility];  Service: Thoracic;  Laterality: N/A;    has HIV INFECTION; HSV; DEPRESSION; HYPERTENSION; COPD; ECZEMA, ATOPIC; ACQUIRED SPONDYLOLISTHESIS; INSOMNIA; TB SKIN TEST, POSITIVE; ALCOHOL ABUSE, HX OF; SMOKER; COPD (chronic obstructive pulmonary disease); Lung nodule, multiple; Gynecomastia, male; Pneumothorax of right lung after biopsy; Pneumothorax; Small cell carcinoma of lung; and Cramping of hands on his problem list.     has No Known Allergies.    Medication List       This list is accurate as of: 02/20/14 11:59 PM.  Always use your most recent med list.               doxylamine (Sleep) 25 MG tablet  Commonly  known as:  UNISOM  Take 50 mg by mouth at bedtime as needed for sleep.     efavirenz-emtricitabine-tenofovir 600-200-300 MG per tablet  Commonly known as:  ATRIPLA  Take 1 tablet by mouth daily.     fluticasone 50 MCG/ACT nasal spray  Commonly known as:  FLONASE  Place 2 sprays into both nostrils daily as needed for allergies.     HYDROcodone-acetaminophen 5-325 MG per tablet  Commonly known as:  LORTAB  Take 1-2 tablets by mouth every 6 (six) hours as needed for moderate pain.     lidocaine-prilocaine cream  Commonly known as:  EMLA  Apply 1 application topically as needed. Apply to port 1 hour before chemo appointment.     Melatonin 10 MG Tabs  Take 10 mg by mouth at bedtime as needed (sleep).     OVER THE COUNTER MEDICATION  daily. Essential Oils     PROAIR HFA 108 (90 BASE) MCG/ACT inhaler  Generic drug:  albuterol  Inhale 2 puffs into the lungs every 4 (four) hours as needed for wheezing or shortness of breath.     prochlorperazine 10 MG tablet  Commonly known as:  COMPAZINE  Take 1 tablet (10 mg total) by mouth every 6 (six) hours as needed for nausea or vomiting.     traZODone 100 MG tablet  Commonly known as:  DESYREL  Take 100 mg by mouth at bedtime.     varenicline 1 MG tablet  Commonly known as:  CHANTIX  Take 1 mg by mouth 2 (two) times daily.         PHYSICAL EXAMINATION  Blood pressure 127/81, pulse 81, temperature 98.2 F (36.8 C), temperature source Oral, resp. rate 18, weight 194 lb 4.8 oz (88.134 kg), SpO2 99.00%.  Physical Exam  Nursing note and vitals reviewed. Constitutional: He is oriented to person, place, and time and well-developed, well-nourished, and in no distress. No distress.  HENT:  Head: Normocephalic and atraumatic.  Mouth/Throat: Oropharynx is clear and moist. No oropharyngeal exudate.  Eyes: Conjunctivae are normal. Pupils are equal, round, and reactive to light.  Neck: Normal range of motion. Neck supple. No thyromegaly  present.  Cardiovascular: Normal rate, regular rhythm, normal heart sounds and intact distal pulses.  Exam reveals no friction rub.   No murmur heard. Pulmonary/Chest: Effort normal and breath sounds normal. No respiratory distress.  Abdominal: Soft. Bowel sounds are normal. He exhibits no distension. There  is no tenderness. There is no rebound.  Musculoskeletal: Normal range of motion. He exhibits no edema and no tenderness.  Lymphadenopathy:    He has no cervical adenopathy.  Neurological: He is alert and oriented to person, place, and time. Gait normal.  Skin: Skin is warm and dry. No rash noted. No erythema.  Psychiatric: Affect normal.    LABORATORY DATA:. CBC  Lab Results  Component Value Date   WBC 7.5 02/20/2014   RBC 4.18* 02/20/2014   HGB 13.0 02/20/2014   HCT 39.0 02/20/2014   PLT 320 02/20/2014   MCV 93.3 02/20/2014   MCH 31.1 02/20/2014   MCHC 33.3 02/20/2014   RDW 18.4* 02/20/2014   LYMPHSABS 2.0 02/20/2014   MONOABS 0.6 02/20/2014   EOSABS 0.1 02/20/2014   BASOSABS 0.0 02/20/2014     CMET  Lab Results  Component Value Date   NA 138 02/20/2014   K 4.5 02/20/2014   CL 102 01/22/2014   CO2 22 02/20/2014   GLUCOSE 142* 02/20/2014   BUN 10.6 02/20/2014   CREATININE 0.9 02/20/2014   CALCIUM 9.3 02/20/2014   PROT 7.4 02/20/2014   ALBUMIN 3.6 02/20/2014   AST 22 02/20/2014   ALT 22 02/20/2014   ALKPHOS 107 02/20/2014   BILITOT 0.23 02/20/2014   GFRNONAA >89 10/23/2013   GFRAA >89 10/23/2013      RADIOGRAPHIC STUDIES:  CT Chest W Contrast Status: Final result         PACS Images    Show images for CT Chest W Contrast         Study Result    CLINICAL DATA: Lung cancer. Cough and shortness of breath. Nausea.  EXAM:  CT CHEST, ABDOMEN, AND PELVIS WITH CONTRAST  TECHNIQUE:  Multidetector CT imaging of the chest, abdomen and pelvis was  performed following the standard protocol during bolus  administration of intravenous contrast.  CONTRAST: 123mL OMNIPAQUE IOHEXOL 300 MG/ML SOLN    COMPARISON: PET-CT from 11/01/2013. Chest CT from 10/24/2013.  FINDINGS:  CT CHEST FINDINGS  Soft tissue / Mediastinum: Left Port-A-Cath tip is positioned at the  mid SVC.  There is no axillary lymphadenopathy. No mediastinal or hilar  lymphadenopathy. Thoracic esophagus is unremarkable. 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 Heart size  is normal. Coronary artery calcification is noted. Trace anterior  pericardial fluid or thickening is minimally progressed in the  interval.  Lungs / Pleura: Marked changes of emphysema noted. Posterior right  upper lobe nodule seen on the previous study has decreased  substantially in size in the interval. The lesion measures 7 x 6 mm  today compared to 19 x 14 mm previously. Tiny right upper lobe  pulmonary nodules on images 35 and 36 are stable. Multiple other  tiny nodules scattered through both lungs are unchanged when taking  into account the thicker slice collimation. No edema or focal  airspace consolidation. No pleural effusion.  Bones: Bone windows reveal no worrisome lytic or sclerotic osseous  lesions.  CT ABDOMEN AND PELVIS FINDINGS  Liver: Insert normal Liver  Spleen: No splenomegaly. No focal mass lesion.  Stomach: Nondistended. No gastric wall thickening. No evidence of  outlet obstruction.  Pancreas: No focal mass lesion. No dilatation of the main duct. No  intraparenchymal cyst. No peripancreatic edema.  Gallbladder/Biliary: No evidence for gallstones. No pericholecystic  fluid. No intrahepatic or extrahepatic biliary dilation.  Kidneys/Adrenals: No adrenal nodule. Kidneys are unremarkable.  Bowel Loops: Duodenum is normally positioned  as is the ligament of  Treitz. No small bowel dilatation or evidence of small bowel wall  thickening. Terminal ileum is normal. The appendix is normal.  Scattered diverticular changes are seen in the left colon without  diverticulitis.  Nodes: No retroperitoneal  lymphadenopathy. The 1.3 x 1.4 cm  gastrohepatic ligament lymph nodes seen on image 63 is slightly more  prominent than on the previous studies. No pelvic sidewall  lymphadenopathy.  Vasculature: Atherosclerotic calcification is noted in the wall of  the abdominal aorta without aneurysm.  Pelvic Genitourinary: Bladder is decompressed which may account for  the apparent circumferential wall thickening. The prostate gland is  enlarged.  Bones/Musculoskeletal: Patient is status post lower lumbar fusion.  Bone windows reveal no worrisome lytic or sclerotic osseous lesions.  Body Wall: No abdominal wall hernia.  Other: No intraperitoneal free fluid.  IMPRESSION:  Marked interval reduction in size of the posterior right lower lobe  lesion. Scattered tiny bilateral pulmonary nodules are unchanged in  the interval.  Isolated gastrohepatic ligament lymph node is slightly more  prominent on previous studies. While this is probably reactive,  attention on followup imaging is recommended.  Electronically Signed  By: Misty Stanley M.D.  On: 02/16/2014 15:43     ASSESSMENT/PLAN:    Small cell carcinoma of lung  Assessment & Plan Restaging CT of the chest/abdomen/pelvis revealed marked interval reduction in size of the posterior right lower lobe lesion.  Scattered tiny bilateral pulmonary nodules are unchanged in the interval.  Patient appears to be tolerating his chemotherapy regimen fairly well.  Carefully reviewed all of today's lab results with the patient in detail.  Patient will proceed today with cycle 4 of his carboplatin/etoposide chemotherapy regimen as previously directed.  He'll return on day 4 for his Neulasta injection for growth factor support.  He has plans to return on 03/13/2014 for cycle 5 of the same regimen.  He'll also continue to obtain a weekly labs.   Cramping of hands  Assessment & Plan Patient's only major complaint at this time is some occasional, intermittent, mild  cramping of his hands.  Most likely, this is related to his chemotherapy.  There were no electrolyte abnormalities up her lab results today.    Also,  patient will be given a nutrition consult per his request.  Patient will also receive his influenza injection as well today.    Patient stated understanding of all instructions; and was in agreement with this plan of care. The patient knows to call the clinic with any problems, questions or concerns.   This was a shared visit with Dr. Julien Nordmann today.  Total time spent with patient was 25 minutes;  with greater than 75 percent of that time spent in face to face counseling regarding reviewing his symptoms, review of his lab results and restaging scans, and coordination of care and follow up.  Disclaimer: This note was dictated with voice recognition software. Similar sounding words can inadvertently be transcribed and may not be corrected upon review.   Drue Second, NP 02/21/2014   ADDENDUM: Hematology/Oncology Attending: I had a face to face encounter with the patient. I recommended his care plan. This is a very pleasant 53 years old [place] American male recently diagnosed with extensive stage small cell lung cancer and currently undergoing systemic chemotherapy with carboplatin and etoposide status post 3 cycles. He is rating his treatment fairly well with no significant adverse effect except for mild fatigue. His recent CT scan of the chest, abdomen and  pelvis showed improvement in his disease. I discussed the scan results and showed the images to the patient today. I recommended for him to continue his systemic chemotherapy with carboplatin and etoposide as scheduled. He will start cycle #4 today. The patient would come back for followup visit in 3 weeks for evaluation and management any adverse effect of his treatment. He would also receive influenza vaccine today. He was advised to call immediately if he has any concerning symptoms in  the interval.  Disclaimer: This note was dictated with voice recognition software. Similar sounding words can inadvertently be transcribed and may be missed upon review. Eilleen Kempf., MD 02/21/2014

## 2014-02-22 ENCOUNTER — Ambulatory Visit (HOSPITAL_BASED_OUTPATIENT_CLINIC_OR_DEPARTMENT_OTHER): Payer: 59

## 2014-02-22 VITALS — BP 137/75 | HR 79 | Temp 98.3°F | Resp 18

## 2014-02-22 DIAGNOSIS — Z5111 Encounter for antineoplastic chemotherapy: Secondary | ICD-10-CM

## 2014-02-22 DIAGNOSIS — C3491 Malignant neoplasm of unspecified part of right bronchus or lung: Secondary | ICD-10-CM

## 2014-02-22 DIAGNOSIS — C3410 Malignant neoplasm of upper lobe, unspecified bronchus or lung: Secondary | ICD-10-CM

## 2014-02-22 MED ORDER — SODIUM CHLORIDE 0.9 % IV SOLN
Freq: Once | INTRAVENOUS | Status: AC
  Administered 2014-02-22: 12:00:00 via INTRAVENOUS

## 2014-02-22 MED ORDER — HEPARIN SOD (PORK) LOCK FLUSH 100 UNIT/ML IV SOLN
500.0000 [IU] | Freq: Once | INTRAVENOUS | Status: AC | PRN
  Administered 2014-02-22: 500 [IU]
  Filled 2014-02-22: qty 5

## 2014-02-22 MED ORDER — PROCHLORPERAZINE MALEATE 10 MG PO TABS
10.0000 mg | ORAL_TABLET | Freq: Once | ORAL | Status: AC
  Administered 2014-02-22: 10 mg via ORAL

## 2014-02-22 MED ORDER — ETOPOSIDE CHEMO INJECTION 1 GM/50ML
100.0000 mg/m2 | Freq: Once | INTRAVENOUS | Status: AC
  Administered 2014-02-22: 210 mg via INTRAVENOUS
  Filled 2014-02-22: qty 10.5

## 2014-02-22 MED ORDER — SODIUM CHLORIDE 0.9 % IJ SOLN
10.0000 mL | INTRAMUSCULAR | Status: DC | PRN
Start: 2014-02-22 — End: 2014-02-22
  Administered 2014-02-22: 10 mL
  Filled 2014-02-22: qty 10

## 2014-02-22 MED ORDER — PROCHLORPERAZINE MALEATE 10 MG PO TABS
ORAL_TABLET | ORAL | Status: AC
Start: 2014-02-22 — End: 2014-02-22
  Filled 2014-02-22: qty 1

## 2014-02-22 NOTE — Patient Instructions (Signed)
[facility] Discharge Instructions for Patients Receiving Chemotherapy  Today you received the following chemotherapy agents Etoposide  To help prevent nausea and vomiting after your treatment, we encourage you to take your nausea medication as directed.   If you develop nausea and vomiting that is not controlled by your nausea medication, call the clinic.   BELOW ARE SYMPTOMS THAT SHOULD BE REPORTED IMMEDIATELY:  *FEVER GREATER THAN 100.5 F  *CHILLS WITH OR WITHOUT FEVER  NAUSEA AND VOMITING THAT IS NOT CONTROLLED WITH YOUR NAUSEA MEDICATION  *UNUSUAL SHORTNESS OF BREATH  *UNUSUAL BRUISING OR BLEEDING  TENDERNESS IN MOUTH AND THROAT WITH OR WITHOUT PRESENCE OF ULCERS  *URINARY PROBLEMS  *BOWEL PROBLEMS  UNUSUAL RASH Items with * indicate a potential emergency and should be followed up as soon as possible.  Feel free to call the clinic you have any questions or concerns. The clinic phone number is [phone].

## 2014-02-23 ENCOUNTER — Ambulatory Visit (HOSPITAL_BASED_OUTPATIENT_CLINIC_OR_DEPARTMENT_OTHER): Payer: 59

## 2014-02-23 VITALS — BP 156/87 | HR 90 | Temp 99.3°F

## 2014-02-23 DIAGNOSIS — C3491 Malignant neoplasm of unspecified part of right bronchus or lung: Secondary | ICD-10-CM

## 2014-02-23 DIAGNOSIS — C3410 Malignant neoplasm of upper lobe, unspecified bronchus or lung: Secondary | ICD-10-CM

## 2014-02-23 DIAGNOSIS — Z5189 Encounter for other specified aftercare: Secondary | ICD-10-CM

## 2014-02-23 MED ORDER — PEGFILGRASTIM INJECTION 6 MG/0.6ML
6.0000 mg | Freq: Once | SUBCUTANEOUS | Status: AC
  Administered 2014-02-23: 6 mg via SUBCUTANEOUS
  Filled 2014-02-23: qty 0.6

## 2014-02-27 ENCOUNTER — Other Ambulatory Visit (HOSPITAL_BASED_OUTPATIENT_CLINIC_OR_DEPARTMENT_OTHER): Payer: 59

## 2014-02-27 DIAGNOSIS — C3410 Malignant neoplasm of upper lobe, unspecified bronchus or lung: Secondary | ICD-10-CM

## 2014-02-27 DIAGNOSIS — C3491 Malignant neoplasm of unspecified part of right bronchus or lung: Secondary | ICD-10-CM

## 2014-02-27 LAB — COMPREHENSIVE METABOLIC PANEL (CC13)
ALT: 26 U/L (ref 0–55)
ANION GAP: 7 meq/L (ref 3–11)
AST: 18 U/L (ref 5–34)
Albumin: 3.6 g/dL (ref 3.5–5.0)
Alkaline Phosphatase: 154 U/L — ABNORMAL HIGH (ref 40–150)
BUN: 10.2 mg/dL (ref 7.0–26.0)
CALCIUM: 9.1 mg/dL (ref 8.4–10.4)
CHLORIDE: 107 meq/L (ref 98–109)
CO2: 24 meq/L (ref 22–29)
Creatinine: 0.8 mg/dL (ref 0.7–1.3)
Glucose: 100 mg/dl (ref 70–140)
Potassium: 4 mEq/L (ref 3.5–5.1)
Sodium: 138 mEq/L (ref 136–145)
Total Bilirubin: 0.23 mg/dL (ref 0.20–1.20)
Total Protein: 7.3 g/dL (ref 6.4–8.3)

## 2014-02-27 LAB — CBC WITH DIFFERENTIAL/PLATELET
BASO%: 0.5 % (ref 0.0–2.0)
Basophils Absolute: 0.1 10e3/uL (ref 0.0–0.1)
EOS%: 0.5 % (ref 0.0–7.0)
Eosinophils Absolute: 0.1 10e3/uL (ref 0.0–0.5)
HCT: 35.8 % — ABNORMAL LOW (ref 38.4–49.9)
HGB: 11.7 g/dL — ABNORMAL LOW (ref 13.0–17.1)
LYMPH%: 10.7 % — ABNORMAL LOW (ref 14.0–49.0)
MCH: 30.9 pg (ref 27.2–33.4)
MCHC: 32.6 g/dL (ref 32.0–36.0)
MCV: 94.8 fL (ref 79.3–98.0)
MONO#: 0.7 10e3/uL (ref 0.1–0.9)
MONO%: 3.5 % (ref 0.0–14.0)
NEUT#: 16.1 10e3/uL — ABNORMAL HIGH (ref 1.5–6.5)
NEUT%: 84.8 % — ABNORMAL HIGH (ref 39.0–75.0)
Platelets: 233 10e3/uL (ref 140–400)
RBC: 3.77 10e6/uL — ABNORMAL LOW (ref 4.20–5.82)
RDW: 18 % — ABNORMAL HIGH (ref 11.0–14.6)
WBC: 19 10e3/uL — ABNORMAL HIGH (ref 4.0–10.3)
lymph#: 2 10e3/uL (ref 0.9–3.3)

## 2014-03-02 DIAGNOSIS — Z0279 Encounter for issue of other medical certificate: Secondary | ICD-10-CM

## 2014-03-06 ENCOUNTER — Ambulatory Visit (HOSPITAL_BASED_OUTPATIENT_CLINIC_OR_DEPARTMENT_OTHER): Payer: 59

## 2014-03-06 DIAGNOSIS — C3491 Malignant neoplasm of unspecified part of right bronchus or lung: Secondary | ICD-10-CM

## 2014-03-06 DIAGNOSIS — C3410 Malignant neoplasm of upper lobe, unspecified bronchus or lung: Secondary | ICD-10-CM

## 2014-03-06 LAB — COMPREHENSIVE METABOLIC PANEL (CC13)
ALT: 18 U/L (ref 0–55)
AST: 20 U/L (ref 5–34)
Albumin: 3.7 g/dL (ref 3.5–5.0)
Alkaline Phosphatase: 149 U/L (ref 40–150)
Anion Gap: 8 mEq/L (ref 3–11)
BILIRUBIN TOTAL: 0.38 mg/dL (ref 0.20–1.20)
BUN: 10.3 mg/dL (ref 7.0–26.0)
CHLORIDE: 108 meq/L (ref 98–109)
CO2: 25 mEq/L (ref 22–29)
CREATININE: 0.8 mg/dL (ref 0.7–1.3)
Calcium: 8.8 mg/dL (ref 8.4–10.4)
Glucose: 105 mg/dl (ref 70–140)
Potassium: 4.1 mEq/L (ref 3.5–5.1)
Sodium: 142 mEq/L (ref 136–145)
Total Protein: 7.2 g/dL (ref 6.4–8.3)

## 2014-03-06 LAB — CBC WITH DIFFERENTIAL/PLATELET
BASO%: 0.9 % (ref 0.0–2.0)
Basophils Absolute: 0.1 10*3/uL (ref 0.0–0.1)
EOS%: 0.4 % (ref 0.0–7.0)
Eosinophils Absolute: 0 10*3/uL (ref 0.0–0.5)
HCT: 39.8 % (ref 38.4–49.9)
HGB: 13 g/dL (ref 13.0–17.1)
LYMPH#: 1.9 10*3/uL (ref 0.9–3.3)
LYMPH%: 18.5 % (ref 14.0–49.0)
MCH: 31.6 pg (ref 27.2–33.4)
MCHC: 32.6 g/dL (ref 32.0–36.0)
MCV: 97.2 fL (ref 79.3–98.0)
MONO#: 0.5 10*3/uL (ref 0.1–0.9)
MONO%: 4.4 % (ref 0.0–14.0)
NEUT#: 7.9 10*3/uL — ABNORMAL HIGH (ref 1.5–6.5)
NEUT%: 75.8 % — AB (ref 39.0–75.0)
Platelets: 118 10*3/uL — ABNORMAL LOW (ref 140–400)
RBC: 4.09 10*6/uL — ABNORMAL LOW (ref 4.20–5.82)
RDW: 18.7 % — ABNORMAL HIGH (ref 11.0–14.6)
WBC: 10.4 10*3/uL — ABNORMAL HIGH (ref 4.0–10.3)

## 2014-03-13 ENCOUNTER — Ambulatory Visit (HOSPITAL_BASED_OUTPATIENT_CLINIC_OR_DEPARTMENT_OTHER): Payer: 59

## 2014-03-13 ENCOUNTER — Ambulatory Visit (HOSPITAL_BASED_OUTPATIENT_CLINIC_OR_DEPARTMENT_OTHER): Payer: 59 | Admitting: Nurse Practitioner

## 2014-03-13 ENCOUNTER — Ambulatory Visit: Payer: 59 | Admitting: Nutrition

## 2014-03-13 ENCOUNTER — Encounter: Payer: Self-pay | Admitting: Nurse Practitioner

## 2014-03-13 ENCOUNTER — Other Ambulatory Visit: Payer: Self-pay | Admitting: Nurse Practitioner

## 2014-03-13 ENCOUNTER — Other Ambulatory Visit: Payer: 59

## 2014-03-13 ENCOUNTER — Ambulatory Visit: Payer: 59

## 2014-03-13 ENCOUNTER — Other Ambulatory Visit: Payer: Self-pay | Admitting: Internal Medicine

## 2014-03-13 VITALS — BP 144/82 | HR 87 | Temp 99.0°F | Resp 18 | Ht 70.0 in

## 2014-03-13 DIAGNOSIS — F3289 Other specified depressive episodes: Secondary | ICD-10-CM

## 2014-03-13 DIAGNOSIS — R197 Diarrhea, unspecified: Secondary | ICD-10-CM | POA: Insufficient documentation

## 2014-03-13 DIAGNOSIS — Z5111 Encounter for antineoplastic chemotherapy: Secondary | ICD-10-CM

## 2014-03-13 DIAGNOSIS — R5383 Other fatigue: Secondary | ICD-10-CM | POA: Insufficient documentation

## 2014-03-13 DIAGNOSIS — C3490 Malignant neoplasm of unspecified part of unspecified bronchus or lung: Secondary | ICD-10-CM

## 2014-03-13 DIAGNOSIS — F17200 Nicotine dependence, unspecified, uncomplicated: Secondary | ICD-10-CM

## 2014-03-13 DIAGNOSIS — R530 Neoplastic (malignant) related fatigue: Secondary | ICD-10-CM

## 2014-03-13 DIAGNOSIS — C3410 Malignant neoplasm of upper lobe, unspecified bronchus or lung: Secondary | ICD-10-CM

## 2014-03-13 DIAGNOSIS — F329 Major depressive disorder, single episode, unspecified: Secondary | ICD-10-CM

## 2014-03-13 DIAGNOSIS — Z95828 Presence of other vascular implants and grafts: Secondary | ICD-10-CM

## 2014-03-13 DIAGNOSIS — C3491 Malignant neoplasm of unspecified part of right bronchus or lung: Secondary | ICD-10-CM

## 2014-03-13 LAB — CBC WITH DIFFERENTIAL/PLATELET
BASO%: 0.5 % (ref 0.0–2.0)
BASOS ABS: 0 10*3/uL (ref 0.0–0.1)
EOS%: 0.8 % (ref 0.0–7.0)
Eosinophils Absolute: 0.1 10*3/uL (ref 0.0–0.5)
HEMATOCRIT: 38 % — AB (ref 38.4–49.9)
HGB: 12.7 g/dL — ABNORMAL LOW (ref 13.0–17.1)
LYMPH%: 26.2 % (ref 14.0–49.0)
MCH: 32.6 pg (ref 27.2–33.4)
MCHC: 33.3 g/dL (ref 32.0–36.0)
MCV: 97.8 fL (ref 79.3–98.0)
MONO#: 0.6 10*3/uL (ref 0.1–0.9)
MONO%: 7.2 % (ref 0.0–14.0)
NEUT%: 65.3 % (ref 39.0–75.0)
NEUTROS ABS: 5 10*3/uL (ref 1.5–6.5)
PLATELETS: 254 10*3/uL (ref 140–400)
RBC: 3.88 10*6/uL — ABNORMAL LOW (ref 4.20–5.82)
RDW: 18.8 % — ABNORMAL HIGH (ref 11.0–14.6)
WBC: 7.6 10*3/uL (ref 4.0–10.3)
lymph#: 2 10*3/uL (ref 0.9–3.3)

## 2014-03-13 LAB — COMPREHENSIVE METABOLIC PANEL (CC13)
ALK PHOS: 127 U/L (ref 40–150)
ALT: 26 U/L (ref 0–55)
AST: 28 U/L (ref 5–34)
Albumin: 3.6 g/dL (ref 3.5–5.0)
Anion Gap: 7 mEq/L (ref 3–11)
BUN: 12.4 mg/dL (ref 7.0–26.0)
CALCIUM: 9.2 mg/dL (ref 8.4–10.4)
CO2: 22 mEq/L (ref 22–29)
Chloride: 109 mEq/L (ref 98–109)
Creatinine: 0.9 mg/dL (ref 0.7–1.3)
Glucose: 106 mg/dl (ref 70–140)
Potassium: 4.4 mEq/L (ref 3.5–5.1)
Sodium: 138 mEq/L (ref 136–145)
Total Bilirubin: 0.42 mg/dL (ref 0.20–1.20)
Total Protein: 7.2 g/dL (ref 6.4–8.3)

## 2014-03-13 MED ORDER — MIRTAZAPINE 15 MG PO TABS: 15.0000 mg | ORAL_TABLET | Freq: Every day | ORAL | Status: DC

## 2014-03-13 MED ORDER — SODIUM CHLORIDE 0.9 % IJ SOLN
10.0000 mL | INTRAMUSCULAR | Status: DC | PRN
  Administered 2014-03-13: 10 mL via INTRAVENOUS
  Filled 2014-03-13: qty 10

## 2014-03-13 MED ORDER — SODIUM CHLORIDE 0.9 % IV SOLN
Freq: Once | INTRAVENOUS | Status: AC
  Administered 2014-03-13: 10:00:00 via INTRAVENOUS

## 2014-03-13 MED ORDER — SODIUM CHLORIDE 0.9 % IJ SOLN
10.0000 mL | INTRAMUSCULAR | Status: DC | PRN
  Administered 2014-03-13: 10 mL
  Filled 2014-03-13: qty 10

## 2014-03-13 MED ORDER — ETOPOSIDE CHEMO INJECTION 1 GM/50ML
100.0000 mg/m2 | Freq: Once | INTRAVENOUS | Status: AC
  Administered 2014-03-13: 210 mg via INTRAVENOUS
  Filled 2014-03-13: qty 10.5

## 2014-03-13 MED ORDER — ONDANSETRON 16 MG/50ML IVPB (CHCC)
16.0000 mg | Freq: Once | INTRAVENOUS | Status: AC
  Administered 2014-03-13: 16 mg via INTRAVENOUS

## 2014-03-13 MED ORDER — HEPARIN SOD (PORK) LOCK FLUSH 100 UNIT/ML IV SOLN
500.0000 [IU] | Freq: Once | INTRAVENOUS | Status: AC | PRN
  Administered 2014-03-13: 500 [IU]
  Filled 2014-03-13: qty 5

## 2014-03-13 MED ORDER — LIDOCAINE-PRILOCAINE 2.5-2.5 % EX CREA: 1.0000 "application " | TOPICAL_CREAM | CUTANEOUS | Status: DC | PRN

## 2014-03-13 MED ORDER — PROCHLORPERAZINE MALEATE 10 MG PO TABS: 10.0000 mg | ORAL_TABLET | Freq: Four times a day (QID) | ORAL | Status: DC | PRN

## 2014-03-13 MED ORDER — ONDANSETRON 16 MG/50ML IVPB (CHCC)
INTRAVENOUS | Status: AC
  Filled 2014-03-13: qty 16

## 2014-03-13 MED ORDER — DEXAMETHASONE SODIUM PHOSPHATE 20 MG/5ML IJ SOLN
20.0000 mg | Freq: Once | INTRAMUSCULAR | Status: AC
  Administered 2014-03-13: 20 mg via INTRAVENOUS

## 2014-03-13 MED ORDER — DEXAMETHASONE SODIUM PHOSPHATE 20 MG/5ML IJ SOLN
INTRAMUSCULAR | Status: AC
  Filled 2014-03-13: qty 5

## 2014-03-13 MED ORDER — CARBOPLATIN CHEMO INJECTION 600 MG/60ML
687.0000 mg | Freq: Once | INTRAVENOUS | Status: AC
  Administered 2014-03-13: 690 mg via INTRAVENOUS
  Filled 2014-03-13: qty 69

## 2014-03-13 NOTE — Assessment & Plan Note (Signed)
Patient is complaining of some occasional diarrhea.  Most, this is secondary to his chemotherapy.  Patient states he has not tried any over-the-counter Imodium.  Advised patient to try the Imodium as directed.

## 2014-03-13 NOTE — Patient Instructions (Signed)

## 2014-03-13 NOTE — Assessment & Plan Note (Signed)
Patient appears to be tolerating his chemotherapy fairly well.  Reviewed all lab results with patient in detail today.  Patient will proceed today with cycle 5 of his carboplatin/etoposide chemotherapy regimen.  Patient will return on day 4 for his Neulasta injection for growth factor support.  He will continue with weekly labs.  Patient will return for labs, visit, and chemotherapy again on 04/03/2014.

## 2014-03-13 NOTE — Progress Notes (Signed)
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  He is a patient of Dr. Earlie Server.  Past medical history includes HIV, hyperlipidemia, insomnia, COPD, depression, and pneumonia.  Medications include melatonin, Compazine, and Chantix.  Labs include glucose 142 on September 8.  Height: 5 feet 10 inches. Weight: 194.3 pounds September 8. Usual body weight: 203 pounds November 2014. BMI: 27.88.  Patient reports he has had occasional nausea and diarrhea.  He has lost approximately 10 pounds.  He can describe no pattern of nausea and diarrhea.  He tells me he is lactose intolerant.  Nutrition diagnosis: Inadequate oral intake related to nutrition impact symptoms associated with small cell lung cancer and associated treatments as evidenced by approximate 10 pound weight loss from usual body weight.  Intervention: Educated patient on smaller, more frequent meals and snacks, including protein 6 times daily. Educated patient on strategies for improved eating with nausea.  Provided fact sheets. Educated patient on strategies for eating with diarrhea.  Provided fact sheets. Encouraged patient to try oral nutrition supplements if unable to eat well.   Questions were answered.  Teach back method used.  Contact information provided.  Monitoring, evaluation, goals: Patient will tolerate adequate calories and protein to promote weight maintenance throughout treatment.  Next visit: Patient will contact me with questions or concerns.  **Disclaimer: This note was dictated with voice recognition software. Similar sounding words can inadvertently be transcribed and this note may contain transcription errors which may not have been corrected upon publication of note.**

## 2014-03-13 NOTE — Assessment & Plan Note (Signed)
Most likely, fatigue is secondary to chemotherapy.  Patient was encouraged to remain as active as possible.

## 2014-03-13 NOTE — Assessment & Plan Note (Signed)
Patient states that he had completely stopped smoking; but has started smoking again out of boredom.  Patient was once again encouraged to discontinue all smoking if at all possible.

## 2014-03-13 NOTE — Progress Notes (Signed)
[name]   Chief Complaint  Patient presents with  . Follow-up    HPI: Jaime Morris 53 y.o. male diagnosed with lung cancer.  Patient is currently undergoing carboplatin/etoposide chemotherapy regimen.  Patient presents today for cycle 5 of his carboplatin/etoposide chemotherapy regimen he states he's been doing fairly well recently; with the exception of some chronic, mild fatigue.  He also states that he has had some intermittent, mild diarrhea.  He admits he is not tried any over-the-counter Imodium.  He states that he has had no nausea or vomiting.  He denies any recent fevers or chills.  He is complaining of a slight increase in his of chronic depression.  He states he has been taking trazodone every evening; but is wondering if there is anything else he may try for depression.  He denies any suicidal or homicidal ideation.  Also, patient states that he ate complete stop smoking; but has started smoking again " out of boredom".  The patient is also questioning if he is a candidate for the shingles vaccine or the pneumonia vaccine.  He received his flu shot a few weeks ago. He also has some extended leave paperwork he would like have filled out within this next week or so.   HPI  CURRENT THERAPY: Upcoming Treatment Dates - LUNG SMALL CELL Carboplatin D1 / Etoposide D1-3 q21d Days with orders from any treatment category:  03/14/2014      SCHEDULING COMMUNICATION      prochlorperazine (COMPAZINE) tablet 10 mg      etoposide (VEPESID) 210 mg in sodium chloride 0.9 % 600 mL chemo infusion      sodium chloride 0.9 % injection 10 mL      heparin lock flush 100 unit/mL      heparin lock flush 100 unit/mL      alteplase (CATHFLO ACTIVASE) injection 2 mg      sodium chloride 0.9 % injection 3 mL      Hot Pack 1 packet      0.9 %  sodium chloride infusion 03/15/2014      SCHEDULING COMMUNICATION      prochlorperazine (COMPAZINE) tablet 10 mg      etoposide  (VEPESID) 210 mg in sodium chloride 0.9 % 600 mL chemo infusion      sodium chloride 0.9 % injection 10 mL      heparin lock flush 100 unit/mL      heparin lock flush 100 unit/mL      alteplase (CATHFLO ACTIVASE) injection 2 mg      sodium chloride 0.9 % injection 3 mL      Hot Pack 1 packet      0.9 %  sodium chloride infusion 03/16/2014      SCHEDULING COMMUNICATION INJECTION      pegfilgrastim (NEULASTA) injection 6 mg    ROS  Past Medical History  Diagnosis Date  . HIV infection     DR. [name]  . Hyperlipidemia   . Insomnia   . Lung nodule, multiple 10/24/13    CT CHEST W/O...DR. [name]  . Gynecomastia, male 10/16/13    MILD RIGHT SIDED PER RADIOLOGY STUDY  . COPD (chronic obstructive pulmonary disease)     PFT'S 10/26/13 @ [facility]  . History of depression   . History of pneumonia   . Small cell lung cancer   . Collapsed lung     d/t biopsy, right    Past Surgical History  Procedure Laterality Date  .  Foot surgery      BUNIONECTOMY  . Back surgery      L 4-5 FUSION WITH SCREW  . Colonoscopy  UTD  . Portacath placement N/A 12/08/2013    Procedure: INSERTION PORT-A-CATH;  Surgeon: Grace Isaac, MD;  Location: [facility];  Service: Thoracic;  Laterality: N/A;    has HIV INFECTION; HSV; DEPRESSION; HYPERTENSION; COPD; ECZEMA, ATOPIC; ACQUIRED SPONDYLOLISTHESIS; INSOMNIA; TB SKIN TEST, POSITIVE; ALCOHOL ABUSE, HX OF; SMOKER; COPD (chronic obstructive pulmonary disease); Lung nodule, multiple; Gynecomastia, male; Pneumothorax of right lung after biopsy; Pneumothorax; Small cell carcinoma of lung; Cramping of hands; Diarrhea; and Fatigue on his problem list.     is allergic to lactose intolerance (gi).    Medication List       This list is accurate as of: 03/13/14  7:31 PM.  Always use your most recent med list.               doxylamine (Sleep) 25 MG tablet  Commonly known as:  UNISOM  Take 50 mg by mouth at bedtime as needed for sleep.      efavirenz-emtricitabine-tenofovir 600-200-300 MG per tablet  Commonly known as:  ATRIPLA  Take 1 tablet by mouth daily.     fluticasone 50 MCG/ACT nasal spray  Commonly known as:  FLONASE  Place 2 sprays into both nostrils daily as needed for allergies.     HYDROcodone-acetaminophen 5-325 MG per tablet  Commonly known as:  LORTAB  Take 1-2 tablets by mouth every 6 (six) hours as needed for moderate pain.     lidocaine-prilocaine cream  Commonly known as:  EMLA  Apply 1 application topically as needed. Apply to port 1 hour before chemo appointment.     Melatonin 10 MG Tabs  Take 10 mg by mouth at bedtime as needed (sleep).     mirtazapine 15 MG tablet  Commonly known as:  REMERON  Take 1 tablet (15 mg total) by mouth at bedtime.     OVER THE COUNTER MEDICATION  daily. Essential Oils     PROAIR HFA 108 (90 BASE) MCG/ACT inhaler  Generic drug:  albuterol  Inhale 2 puffs into the lungs every 4 (four) hours as needed for wheezing or shortness of breath.     prochlorperazine 10 MG tablet  Commonly known as:  COMPAZINE  Take 1 tablet (10 mg total) by mouth every 6 (six) hours as needed for nausea or vomiting.     varenicline 1 MG tablet  Commonly known as:  CHANTIX  Take 1 mg by mouth 2 (two) times daily.         PHYSICAL EXAMINATION  Vitals: BP 144/82, HR 87, temp 99.0  Physical Exam  Nursing note and vitals reviewed. Constitutional: He is oriented to person, place, and time. No distress.  HENT:  Head: Normocephalic and atraumatic.  Right Ear: External ear normal.  Mouth/Throat: Oropharynx is clear and moist. No oropharyngeal exudate.  Eyes: Conjunctivae and EOM are normal. Pupils are equal, round, and reactive to light. No scleral icterus.  Neck: Normal range of motion. Neck supple. No JVD present. No thyromegaly present.  Cardiovascular: Normal rate, regular rhythm, normal heart sounds and intact distal pulses.  Exam reveals no friction rub.   No murmur  heard. Pulmonary/Chest: Effort normal and breath sounds normal. No respiratory distress.  Abdominal: Soft. Bowel sounds are normal. He exhibits no distension. There is no tenderness. There is no rebound.  Musculoskeletal: Normal range of motion. He exhibits no edema and  no tenderness.  Lymphadenopathy:    He has no cervical adenopathy.  Neurological: He is alert and oriented to person, place, and time. Gait normal.  Skin: Skin is warm and dry. No rash noted. No erythema.  Psychiatric: Affect normal.    LABORATORY DATA:. CBC  Lab Results  Component Value Date   WBC 7.6 03/13/2014   RBC 3.88* 03/13/2014   HGB 12.7* 03/13/2014   HCT 38.0* 03/13/2014   PLT 254 03/13/2014   MCV 97.8 03/13/2014   MCH 32.6 03/13/2014   MCHC 33.3 03/13/2014   RDW 18.8* 03/13/2014   LYMPHSABS 2.0 03/13/2014   MONOABS 0.6 03/13/2014   EOSABS 0.1 03/13/2014   BASOSABS 0.0 03/13/2014     CMET  Lab Results  Component Value Date   NA 138 03/13/2014   K 4.4 03/13/2014   CL 102 01/22/2014   CO2 22 03/13/2014   GLUCOSE 106 03/13/2014   BUN 12.4 03/13/2014   CREATININE 0.9 03/13/2014   CALCIUM 9.2 03/13/2014   PROT 7.2 03/13/2014   ALBUMIN 3.6 03/13/2014   AST 28 03/13/2014   ALT 26 03/13/2014   ALKPHOS 127 03/13/2014   BILITOT 0.42 03/13/2014   GFRNONAA >89 10/23/2013   GFRAA >89 10/23/2013     ASSESSMENT/PLAN:    DEPRESSION  Assessment & Plan Patient does complain today of some increased depression.  He continues to deny any suicidal or homicidal ideation.  Patient has been taking trazodone at night to assist in sleep and for treatment of his depression.  Have advised patient to discontinue the trazodone; to try Remeron 15 mg every evening at bedtime.  Patient requested that the Remeron prescription be [facility].   SMOKER  Assessment & Plan Patient states that he had completely stopped smoking; but has started smoking again out of boredom.  Patient was once again encouraged to discontinue all  smoking if at all possible.   Small cell carcinoma of lung  Assessment & Plan Patient appears to be tolerating his chemotherapy fairly well.  Reviewed all lab results with patient in detail today.  Patient will proceed today with cycle 5 of his carboplatin/etoposide chemotherapy regimen.  Patient will return on day 4 for his Neulasta injection for growth factor support.  He will continue with weekly labs.  Patient will return for labs, visit, and chemotherapy again on 04/03/2014.   Diarrhea  Assessment & Plan Patient is complaining of some occasional diarrhea.  Most, this is secondary to his chemotherapy.  Patient states he has not tried any over-the-counter Imodium.  Advised patient to try the Imodium as directed.   Fatigue  Assessment & Plan Most likely, fatigue is secondary to chemotherapy.  Patient was encouraged to remain as active as possible.   Patient stated understanding of all instructions; and was in agreement with this plan of care. The patient knows to call the clinic with any problems, questions or concerns.   This was a shared visit with Dr. Julien Nordmann today.   Total time spent with patient was 25 minutes;  with greater than 75 percent of that time spent in face to face counseling regarding his symptoms, and coordination of care and follow up.  Disclaimer: This note was dictated with voice recognition software. Similar sounding words can inadvertently be transcribed and may not be corrected upon review.   Jaime Second, NP 03/13/2014   ADDENDUM: Hematology/Oncology Attending: I had a face to face encounter with the patient. I recommended his care plan. This is a  very pleasant 53 years old [place] American male with extensive stage small cell lung cancer currently undergoing systemic chemotherapy with carboplatin and etoposide status post 4 cycles. He tolerated the last cycle of his treatment fairly well with no significant adverse effects except for mild fatigue. I  recommended for the patient to proceed with cycle #5 today as scheduled. He would come back for follow up visit in 3 weeks with the start of cycle #6. He was advised to call immediately if he has any concerning symptoms in the interval.  Disclaimer: This note was dictated with voice recognition software. Similar sounding words can inadvertently be transcribed and may be missed upon review. Eilleen Kempf., MD 03/14/2014

## 2014-03-13 NOTE — Assessment & Plan Note (Signed)
Patient does complain today of some increased depression.  He continues to deny any suicidal or homicidal ideation.  Patient has been taking trazodone at night to assist in sleep and for treatment of his depression.  Have advised patient to discontinue the trazodone; to try Remeron 15 mg every evening at bedtime.  Patient requested that the Remeron prescription be [facility].

## 2014-03-13 NOTE — Patient Instructions (Signed)
[facility] Discharge Instructions for Patients Receiving Chemotherapy  Today you received the following chemotherapy agents Etoposide, Carboplatin  To help prevent nausea and vomiting after your treatment, we encourage you to take your nausea medication Compazine 10 mg every 6 hours for  nausea   If you develop nausea and vomiting that is not controlled by your nausea medication, call the clinic.   BELOW ARE SYMPTOMS THAT SHOULD BE REPORTED IMMEDIATELY:  *FEVER GREATER THAN 100.5 F  *CHILLS WITH OR WITHOUT FEVER  NAUSEA AND VOMITING THAT IS NOT CONTROLLED WITH YOUR NAUSEA MEDICATION  *UNUSUAL SHORTNESS OF BREATH  *UNUSUAL BRUISING OR BLEEDING  TENDERNESS IN MOUTH AND THROAT WITH OR WITHOUT PRESENCE OF ULCERS  *URINARY PROBLEMS  *BOWEL PROBLEMS  UNUSUAL RASH Items with * indicate a potential emergency and should be followed up as soon as possible.  Feel free to call the clinic you have any questions or concerns. The clinic phone number is [phone].

## 2014-03-14 ENCOUNTER — Telehealth: Payer: Self-pay | Admitting: Internal Medicine

## 2014-03-14 ENCOUNTER — Ambulatory Visit (HOSPITAL_BASED_OUTPATIENT_CLINIC_OR_DEPARTMENT_OTHER): Payer: 59

## 2014-03-14 VITALS — BP 146/75 | HR 75 | Temp 98.2°F

## 2014-03-14 DIAGNOSIS — C3491 Malignant neoplasm of unspecified part of right bronchus or lung: Secondary | ICD-10-CM

## 2014-03-14 DIAGNOSIS — C3410 Malignant neoplasm of upper lobe, unspecified bronchus or lung: Secondary | ICD-10-CM

## 2014-03-14 DIAGNOSIS — Z5111 Encounter for antineoplastic chemotherapy: Secondary | ICD-10-CM

## 2014-03-14 MED ORDER — SODIUM CHLORIDE 0.9 % IJ SOLN
10.0000 mL | INTRAMUSCULAR | Status: DC | PRN
  Administered 2014-03-14: 10 mL
  Filled 2014-03-14: qty 10

## 2014-03-14 MED ORDER — PROCHLORPERAZINE MALEATE 10 MG PO TABS
10.0000 mg | ORAL_TABLET | Freq: Once | ORAL | Status: AC
  Administered 2014-03-14: 10 mg via ORAL

## 2014-03-14 MED ORDER — PROCHLORPERAZINE MALEATE 10 MG PO TABS
ORAL_TABLET | ORAL | Status: AC
  Filled 2014-03-14: qty 1

## 2014-03-14 MED ORDER — ETOPOSIDE CHEMO INJECTION 1 GM/50ML
100.0000 mg/m2 | Freq: Once | INTRAVENOUS | Status: AC
  Administered 2014-03-14: 210 mg via INTRAVENOUS
  Filled 2014-03-14: qty 10.5

## 2014-03-14 MED ORDER — HEPARIN SOD (PORK) LOCK FLUSH 100 UNIT/ML IV SOLN
500.0000 [IU] | Freq: Once | INTRAVENOUS | Status: AC | PRN
  Administered 2014-03-14: 500 [IU]
  Filled 2014-03-14: qty 5

## 2014-03-14 MED ORDER — SODIUM CHLORIDE 0.9 % IV SOLN
Freq: Once | INTRAVENOUS | Status: AC
  Administered 2014-03-14: 10:00:00 via INTRAVENOUS

## 2014-03-14 NOTE — Patient Instructions (Signed)
[facility] Discharge Instructions for Patients Receiving Chemotherapy  Today you received the following chemotherapy agents vp 16 To help prevent nausea and vomiting after your treatment, we encourage you to take your nausea medication as directed   If you develop nausea and vomiting that is not controlled by your nausea medication, call the clinic.   BELOW ARE SYMPTOMS THAT SHOULD BE REPORTED IMMEDIATELY:  *FEVER GREATER THAN 100.5 F  *CHILLS WITH OR WITHOUT FEVER  NAUSEA AND VOMITING THAT IS NOT CONTROLLED WITH YOUR NAUSEA MEDICATION  *UNUSUAL SHORTNESS OF BREATH  *UNUSUAL BRUISING OR BLEEDING  TENDERNESS IN MOUTH AND THROAT WITH OR WITHOUT PRESENCE OF ULCERS  *URINARY PROBLEMS  *BOWEL PROBLEMS  UNUSUAL RASH Items with * indicate a potential emergency and should be followed up as soon as possible.  Feel free to call the clinic you have any questions or concerns. The clinic phone number is [phone].

## 2014-03-14 NOTE — Telephone Encounter (Signed)
s.w. pt he is in tx room now...he will get new sched print out.Marland Kitchen

## 2014-03-15 ENCOUNTER — Ambulatory Visit (HOSPITAL_BASED_OUTPATIENT_CLINIC_OR_DEPARTMENT_OTHER): Payer: 59

## 2014-03-15 VITALS — BP 130/80 | HR 88 | Temp 97.8°F | Resp 18

## 2014-03-15 DIAGNOSIS — Z5111 Encounter for antineoplastic chemotherapy: Secondary | ICD-10-CM

## 2014-03-15 DIAGNOSIS — C3491 Malignant neoplasm of unspecified part of right bronchus or lung: Secondary | ICD-10-CM

## 2014-03-15 DIAGNOSIS — C3410 Malignant neoplasm of upper lobe, unspecified bronchus or lung: Secondary | ICD-10-CM

## 2014-03-15 MED ORDER — PROCHLORPERAZINE MALEATE 10 MG PO TABS
10.0000 mg | ORAL_TABLET | Freq: Once | ORAL | Status: AC
  Administered 2014-03-15: 10 mg via ORAL

## 2014-03-15 MED ORDER — PROCHLORPERAZINE MALEATE 10 MG PO TABS
ORAL_TABLET | ORAL | Status: AC
  Filled 2014-03-15: qty 1

## 2014-03-15 MED ORDER — SODIUM CHLORIDE 0.9 % IJ SOLN
10.0000 mL | INTRAMUSCULAR | Status: DC | PRN
Start: 2014-03-15 — End: 2014-03-15
  Administered 2014-03-15: 10 mL
  Filled 2014-03-15: qty 10

## 2014-03-15 MED ORDER — SODIUM CHLORIDE 0.9 % IV SOLN
Freq: Once | INTRAVENOUS | Status: AC
  Administered 2014-03-15: 10:00:00 via INTRAVENOUS

## 2014-03-15 MED ORDER — HEPARIN SOD (PORK) LOCK FLUSH 100 UNIT/ML IV SOLN
500.0000 [IU] | Freq: Once | INTRAVENOUS | Status: AC | PRN
  Administered 2014-03-15: 500 [IU]
  Filled 2014-03-15: qty 5

## 2014-03-15 MED ORDER — SODIUM CHLORIDE 0.9 % IV SOLN
100.0000 mg/m2 | Freq: Once | INTRAVENOUS | Status: AC
  Administered 2014-03-15: 210 mg via INTRAVENOUS
  Filled 2014-03-15: qty 10.5

## 2014-03-15 NOTE — Patient Instructions (Signed)
[facility] Discharge Instructions for Patients Receiving Chemotherapy  Today you received the following chemotherapy agents: Etoposide.  To help prevent nausea and vomiting after your treatment, we encourage you to take your nausea medication as prescribed.   If you develop nausea and vomiting that is not controlled by your nausea medication, call the clinic.   BELOW ARE SYMPTOMS THAT SHOULD BE REPORTED IMMEDIATELY:  *FEVER GREATER THAN 100.5 F  *CHILLS WITH OR WITHOUT FEVER  NAUSEA AND VOMITING THAT IS NOT CONTROLLED WITH YOUR NAUSEA MEDICATION  *UNUSUAL SHORTNESS OF BREATH  *UNUSUAL BRUISING OR BLEEDING  TENDERNESS IN MOUTH AND THROAT WITH OR WITHOUT PRESENCE OF ULCERS  *URINARY PROBLEMS  *BOWEL PROBLEMS  UNUSUAL RASH Items with * indicate a potential emergency and should be followed up as soon as possible.  Feel free to call the clinic you have any questions or concerns. The clinic phone number is [phone].

## 2014-03-16 ENCOUNTER — Ambulatory Visit (HOSPITAL_BASED_OUTPATIENT_CLINIC_OR_DEPARTMENT_OTHER): Payer: 59

## 2014-03-16 VITALS — BP 151/89 | HR 78 | Temp 98.7°F

## 2014-03-16 DIAGNOSIS — C3410 Malignant neoplasm of upper lobe, unspecified bronchus or lung: Secondary | ICD-10-CM

## 2014-03-16 DIAGNOSIS — C3491 Malignant neoplasm of unspecified part of right bronchus or lung: Secondary | ICD-10-CM

## 2014-03-16 DIAGNOSIS — Z5189 Encounter for other specified aftercare: Secondary | ICD-10-CM

## 2014-03-16 MED ORDER — PEGFILGRASTIM INJECTION 6 MG/0.6ML
6.0000 mg | Freq: Once | SUBCUTANEOUS | Status: AC
  Administered 2014-03-16: 6 mg via SUBCUTANEOUS
  Filled 2014-03-16: qty 0.6

## 2014-03-20 ENCOUNTER — Other Ambulatory Visit (HOSPITAL_BASED_OUTPATIENT_CLINIC_OR_DEPARTMENT_OTHER): Payer: 59

## 2014-03-20 DIAGNOSIS — C3410 Malignant neoplasm of upper lobe, unspecified bronchus or lung: Secondary | ICD-10-CM

## 2014-03-20 DIAGNOSIS — C3491 Malignant neoplasm of unspecified part of right bronchus or lung: Secondary | ICD-10-CM

## 2014-03-20 LAB — CBC WITH DIFFERENTIAL/PLATELET
BASO%: 0.6 % (ref 0.0–2.0)
BASOS ABS: 0.1 10*3/uL (ref 0.0–0.1)
EOS%: 0.5 % (ref 0.0–7.0)
Eosinophils Absolute: 0.1 10*3/uL (ref 0.0–0.5)
HCT: 36.9 % — ABNORMAL LOW (ref 38.4–49.9)
HEMOGLOBIN: 12.2 g/dL — AB (ref 13.0–17.1)
LYMPH%: 12.9 % — ABNORMAL LOW (ref 14.0–49.0)
MCH: 32.9 pg (ref 27.2–33.4)
MCHC: 32.9 g/dL (ref 32.0–36.0)
MCV: 99.8 fL — ABNORMAL HIGH (ref 79.3–98.0)
MONO#: 0.8 10*3/uL (ref 0.1–0.9)
MONO%: 4.5 % (ref 0.0–14.0)
NEUT#: 14.5 10*3/uL — ABNORMAL HIGH (ref 1.5–6.5)
NEUT%: 81.5 % — ABNORMAL HIGH (ref 39.0–75.0)
Platelets: 240 10*3/uL (ref 140–400)
RBC: 3.7 10*6/uL — ABNORMAL LOW (ref 4.20–5.82)
RDW: 16.7 % — AB (ref 11.0–14.6)
WBC: 17.8 10*3/uL — ABNORMAL HIGH (ref 4.0–10.3)
lymph#: 2.3 10*3/uL (ref 0.9–3.3)

## 2014-03-20 LAB — COMPREHENSIVE METABOLIC PANEL (CC13)
ALBUMIN: 3.8 g/dL (ref 3.5–5.0)
ALK PHOS: 159 U/L — AB (ref 40–150)
ALT: 24 U/L (ref 0–55)
AST: 20 U/L (ref 5–34)
Anion Gap: 6 mEq/L (ref 3–11)
BUN: 8.5 mg/dL (ref 7.0–26.0)
CO2: 27 mEq/L (ref 22–29)
Calcium: 9.6 mg/dL (ref 8.4–10.4)
Chloride: 108 mEq/L (ref 98–109)
Creatinine: 0.8 mg/dL (ref 0.7–1.3)
Glucose: 107 mg/dl (ref 70–140)
POTASSIUM: 4.6 meq/L (ref 3.5–5.1)
Sodium: 141 mEq/L (ref 136–145)
Total Bilirubin: 0.23 mg/dL (ref 0.20–1.20)
Total Protein: 7.3 g/dL (ref 6.4–8.3)

## 2014-03-23 ENCOUNTER — Other Ambulatory Visit: Payer: Self-pay | Admitting: *Deleted

## 2014-03-23 DIAGNOSIS — C3490 Malignant neoplasm of unspecified part of unspecified bronchus or lung: Secondary | ICD-10-CM

## 2014-03-27 ENCOUNTER — Ambulatory Visit (HOSPITAL_COMMUNITY)
Admission: RE | Admit: 2014-03-27 | Discharge: 2014-03-27 | Disposition: A | Discharge status: Home / Self Care | Payer: 59 | Source: Ambulatory Visit | Attending: Nurse Practitioner | Admitting: Nurse Practitioner

## 2014-03-27 ENCOUNTER — Other Ambulatory Visit: Payer: 59

## 2014-03-27 ENCOUNTER — Ambulatory Visit (HOSPITAL_BASED_OUTPATIENT_CLINIC_OR_DEPARTMENT_OTHER): Payer: 59

## 2014-03-27 ENCOUNTER — Emergency Department (HOSPITAL_COMMUNITY): Admission: EM | Admit: 2014-03-27 | Discharge: 2014-03-27 | Discharge status: Against Medical Advice | Payer: 59

## 2014-03-27 ENCOUNTER — Telehealth: Payer: Self-pay | Admitting: Nurse Practitioner

## 2014-03-27 ENCOUNTER — Encounter: Payer: Self-pay | Admitting: Nurse Practitioner

## 2014-03-27 ENCOUNTER — Ambulatory Visit (HOSPITAL_BASED_OUTPATIENT_CLINIC_OR_DEPARTMENT_OTHER): Payer: 59 | Admitting: Nurse Practitioner

## 2014-03-27 ENCOUNTER — Telehealth: Payer: Self-pay | Admitting: Medical Oncology

## 2014-03-27 VITALS — BP 155/84 | HR 108 | Temp 99.1°F | Resp 19 | Ht 70.0 in | Wt 189.3 lb

## 2014-03-27 VITALS — Temp 100.6°F

## 2014-03-27 DIAGNOSIS — E860 Dehydration: Secondary | ICD-10-CM | POA: Insufficient documentation

## 2014-03-27 DIAGNOSIS — Z79899 Other long term (current) drug therapy: Secondary | ICD-10-CM | POA: Diagnosis not present

## 2014-03-27 DIAGNOSIS — R509 Fever, unspecified: Secondary | ICD-10-CM | POA: Insufficient documentation

## 2014-03-27 DIAGNOSIS — C3490 Malignant neoplasm of unspecified part of unspecified bronchus or lung: Secondary | ICD-10-CM

## 2014-03-27 DIAGNOSIS — C3410 Malignant neoplasm of upper lobe, unspecified bronchus or lung: Secondary | ICD-10-CM

## 2014-03-27 DIAGNOSIS — D72829 Elevated white blood cell count, unspecified: Secondary | ICD-10-CM

## 2014-03-27 DIAGNOSIS — C3491 Malignant neoplasm of unspecified part of right bronchus or lung: Secondary | ICD-10-CM

## 2014-03-27 DIAGNOSIS — R530 Neoplastic (malignant) related fatigue: Secondary | ICD-10-CM

## 2014-03-27 LAB — URINALYSIS, MICROSCOPIC - CHCC
Bilirubin (Urine): NEGATIVE
GLUCOSE UR CHCC: NEGATIVE mg/dL
Ketones: NEGATIVE mg/dL
LEUKOCYTE ESTERASE: NEGATIVE
Nitrite: NEGATIVE
PROTEIN: NEGATIVE mg/dL
Specific Gravity, Urine: 1.015 (ref 1.003–1.035)
UROBILINOGEN UR: 0.2 mg/dL (ref 0.2–1)
pH: 6 (ref 4.6–8.0)

## 2014-03-27 LAB — CBC WITH DIFFERENTIAL/PLATELET
BASO%: 0.2 % (ref 0.0–2.0)
BASOS ABS: 0 10*3/uL (ref 0.0–0.1)
EOS%: 0.1 % (ref 0.0–7.0)
Eosinophils Absolute: 0 10*3/uL (ref 0.0–0.5)
HCT: 39.6 % (ref 38.4–49.9)
HEMOGLOBIN: 12.8 g/dL — AB (ref 13.0–17.1)
LYMPH%: 10.7 % — AB (ref 14.0–49.0)
MCH: 32.2 pg (ref 27.2–33.4)
MCHC: 32.4 g/dL (ref 32.0–36.0)
MCV: 99.2 fL — AB (ref 79.3–98.0)
MONO#: 0.8 10*3/uL (ref 0.1–0.9)
MONO%: 5.4 % (ref 0.0–14.0)
NEUT#: 12.8 10*3/uL — ABNORMAL HIGH (ref 1.5–6.5)
NEUT%: 83.6 % — AB (ref 39.0–75.0)
PLATELETS: 130 10*3/uL — AB (ref 140–400)
RBC: 3.99 10*6/uL — ABNORMAL LOW (ref 4.20–5.82)
RDW: 16.1 % — ABNORMAL HIGH (ref 11.0–14.6)
WBC: 15.3 10*3/uL — ABNORMAL HIGH (ref 4.0–10.3)
lymph#: 1.6 10*3/uL (ref 0.9–3.3)

## 2014-03-27 LAB — COMPREHENSIVE METABOLIC PANEL (CC13)
ALK PHOS: 135 U/L (ref 40–150)
ALT: 20 U/L (ref 0–55)
AST: 16 U/L (ref 5–34)
Albumin: 3.6 g/dL (ref 3.5–5.0)
Anion Gap: 9 mEq/L (ref 3–11)
BUN: 6.3 mg/dL — AB (ref 7.0–26.0)
CO2: 23 mEq/L (ref 22–29)
Calcium: 9.8 mg/dL (ref 8.4–10.4)
Chloride: 104 mEq/L (ref 98–109)
Creatinine: 0.9 mg/dL (ref 0.7–1.3)
Glucose: 108 mg/dl (ref 70–140)
Potassium: 4.7 mEq/L (ref 3.5–5.1)
Sodium: 137 mEq/L (ref 136–145)
Total Bilirubin: 0.45 mg/dL (ref 0.20–1.20)
Total Protein: 8 g/dL (ref 6.4–8.3)

## 2014-03-27 MED ORDER — LEVOFLOXACIN 500 MG PO TABS: ORAL_TABLET | ORAL | Status: DC

## 2014-03-27 MED ORDER — SODIUM CHLORIDE 0.9 % IV SOLN
INTRAVENOUS | Status: DC
  Administered 2014-03-27: 16:00:00 via INTRAVENOUS

## 2014-03-27 MED ORDER — SODIUM CHLORIDE 0.9 % IJ SOLN
10.0000 mL | INTRAMUSCULAR | Status: DC | PRN
  Administered 2014-03-27: 10 mL via INTRAVENOUS
  Filled 2014-03-27: qty 10

## 2014-03-27 MED ORDER — HEPARIN SOD (PORK) LOCK FLUSH 100 UNIT/ML IV SOLN
500.0000 [IU] | Freq: Once | INTRAVENOUS | Status: AC
  Administered 2014-03-27: 500 [IU] via INTRAVENOUS
  Filled 2014-03-27: qty 5

## 2014-03-27 NOTE — Telephone Encounter (Signed)
Called patient this evening to review his chest x-ray results from earlier this afternoon.  It does appear the patient has some right middle lobe pneumonia.  Patient was prescribed Levaquin earlier this afternoon.  He states he has plans to take Tylenol for his fever.  Pending the blood cultures and urine culture results.  Patient was advised to call or go to the to the emergency department overnight if he has any worsening symptoms whatsoever.  Patient stated understanding of all instructions.  Advised patient that I would call him again tomorrow morning to check on him.

## 2014-03-27 NOTE — Assessment & Plan Note (Signed)
Patient has had a fever intermittently for the past few days to a maximum of 100.1.  Patient states that Tylenol has been ineffective in treating his fever.  White count slightly elevated; but most likely secondary to recent Neulasta injection.  Blood cultures x2 were drawn; but no blood culture from Port-A-Cath.  Urinalysis today was within normal limits; the exception of some moderate blood.  Pending chest x-ray results.  Patient was initiated on Levaquin 500 mg daily for a total of 5 days; and will followup regarding chest x-ray, urine culture, and blood cultures.  Patient was strongly encouraged to call or go directed to the emergency department overnight he develops any new or worsening symptoms whatsoever.

## 2014-03-27 NOTE — Assessment & Plan Note (Signed)
Patient does appear dehydrated today.  Patient will receive 1 L normal saline IV fluid rehydration today.  He was also encouraged to push fluids is much as possible.

## 2014-03-27 NOTE — Assessment & Plan Note (Signed)
Most likely, patient's chronic fatigue is a symptom of his lung cancer; as well as chemotherapy side effect.  Patient was encouraged to remain as active as possible.

## 2014-03-27 NOTE — Telephone Encounter (Signed)
Day 3 of temps >100.5 , Just back in to town after traveling to [place] and [place]. Chemo/Neulasta  last week. He feels achy.appt made for labs and [place] bacon.

## 2014-03-27 NOTE — Patient Instructions (Signed)
Levofloxacin tablets What is this medicine? LEVOFLOXACIN (lee voe FLOX a sin) is a quinolone antibiotic. It is used to treat certain kinds of bacterial infections. It will not work for colds, flu, or other viral infections. This medicine may be used for other purposes; ask your health care provider or pharmacist if you have questions. COMMON BRAND NAME(S): Levaquin, Levaquin Leva-Pak What should I tell my health care provider before I take this medicine? They need to know if you have any of these conditions: -cerebral disease -irregular heartbeat -kidney disease -seizure disorder -an unusual or allergic reaction to levofloxacin, other antibiotics or medicines, foods, dyes, or preservatives -pregnant or trying to get pregnant -breast-feeding How should I use this medicine? Take this medicine by mouth with a full glass of water. Follow the directions on the prescription label. This medicine can be taken with or without food. Take your medicine at regular intervals. Do not take your medicine more often than directed. Do not skip doses or stop your medicine early even if you feel better. Do not stop taking except on your doctor's advice. A special MedGuide will be given to you by the pharmacist with each prescription and refill. Be sure to read this information carefully each time. Talk to your pediatrician regarding the use of this medicine in children. While this drug may be prescribed for children as young as 6 months for selected conditions, precautions do apply. Overdosage: If you think you have taken too much of this medicine contact a poison control center or emergency room at once. NOTE: This medicine is only for you. Do not share this medicine with others. What if I miss a dose? If you miss a dose, take it as soon as you remember. If it is almost time for your next dose, take only that dose. Do not take double or extra doses. What may interact with this medicine? Do not take this medicine  with any of the following medications: -arsenic trioxide -chloroquine -droperidol -medicines for irregular heart rhythm like amiodarone, disopyramide, dofetilide, flecainide, quinidine, procainamide, sotalol -some medicines for depression or mental problems like phenothiazines, pimozide, and ziprasidone This medicine may also interact with the following medications: -amoxapine -antacids -birth control pills -cisapride -dairy products -didanosine (ddI) buffered tablets or powder -haloperidol -multivitamins -NSAIDS, medicines for pain and inflammation, like ibuprofen or naproxen -retinoid products like tretinoin or isotretinoin -risperidone -some other antibiotics like clarithromycin or erythromycin -sucralfate -theophylline -warfarin This list may not describe all possible interactions. Give your health care provider a list of all the medicines, herbs, non-prescription drugs, or dietary supplements you use. Also tell them if you smoke, drink alcohol, or use illegal drugs. Some items may interact with your medicine. What should I watch for while using this medicine? Tell your doctor or health care professional if your symptoms do not improve or if they get worse. Drink several glasses of water a day and cut down on drinks that contain caffeine. You must not get dehydrated while taking this medicine. You may get drowsy or dizzy. Do not drive, use machinery, or do anything that needs mental alertness until you know how this medicine affects you. Do not sit or stand up quickly, especially if you are an older patient. This reduces the risk of dizzy or fainting spells. This medicine can make you more sensitive to the sun. Keep out of the sun. If you cannot avoid being in the sun, wear protective clothing and use a sunscreen. Do not use sun lamps or tanning beds/booths.  Contact your doctor if you get a sunburn. If you are a diabetic monitor your blood glucose carefully. If you get an unusual  reading stop taking this medicine and call your doctor right away. Do not treat diarrhea with over-the-counter products. Contact your doctor if you have diarrhea that lasts more than 2 days or if the diarrhea is severe and watery. Avoid antacids, calcium, iron, and zinc products for 2 hours before and 2 hours after taking a dose of this medicine. What side effects may I notice from receiving this medicine? Side effects that you should report to your doctor or health care professional as soon as possible: -allergic reactions like skin rash or hives, swelling of the face, lips, or tongue -changes in vision -confusion, nightmares or hallucinations -difficulty breathing -irregular heartbeat, chest pain -joint, muscle or tendon pain -pain or difficulty passing urine -persistent headache with or without blurred vision -redness, blistering, peeling or loosening of the skin, including inside the mouth -seizures -unusual pain, numbness, tingling, or weakness -vaginal irritation, discharge Side effects that usually do not require medical attention (report to your doctor or health care professional if they continue or are bothersome): -diarrhea -dry mouth -headache -stomach upset, nausea -trouble sleeping This list may not describe all possible side effects. Call your doctor for medical advice about side effects. You may report side effects to FDA at 1-800-FDA-1088. Where should I keep my medicine? Keep out of the reach of children. Store at room temperature between 15 and 30 degrees C (59 and 86 degrees F). Keep in a tightly closed container. Throw away any unused medicine after the expiration date. NOTE: This sheet is a summary. It may not cover all possible information. If you have questions about this medicine, talk to your doctor, pharmacist, or health care provider.  2015, [employer] Standard. (2013-01-06 07:45:07)

## 2014-03-27 NOTE — Progress Notes (Signed)
Patient discharged from infusion and agreeable to go to [facility] radiology for chest xray. Told patient to please call us or go to emergency room if he still has a fever in 24-36 hours. Also, encouraged patient to drink lots of fluids the next few days. Patient agreeable to this.

## 2014-03-27 NOTE — Progress Notes (Signed)
SYMPTOM MANAGEMENT CLINIC   HPI: Jaime Morris 53 y.o. male diagnosed with lung cancer.  Currently undergoing carboplatin/etoposide chemotherapy regimen.  Patient presented as a walk in today for urgent care visit.  He states that he just returned from a trip to [place] earlier today; and has been experiencing intermittent fevers and chills for the past few days.  He feels increased fatigue as well.  He feels that he is slightly dehydrated.  He denies any cough or congestion.  He denies any GI symptoms whatsoever.  He denies any dysuria or frequency.  Patient has also been diagnosed in the past with HIV; and is followed by Dr.Comer.      Fever  Associated symptoms include headaches. Pertinent negatives include no congestion, ear pain or sore throat.    CURRENT THERAPY: Upcoming Treatment Dates - LUNG SMALL CELL Carboplatin D1 / Etoposide D1-3 q21d Days with orders from any treatment category:  04/03/2014      SCHEDULING COMMUNICATION      ondansetron (ZOFRAN) IVPB 16 mg      Dexamethasone Sodium Phosphate (DECADRON) injection 20 mg      CARBOplatin (PARAPLATIN) in sodium chloride 0.9 % 100 mL chemo infusion      etoposide (VEPESID) 210 mg in sodium chloride 0.9 % 600 mL chemo infusion      sodium chloride 0.9 % injection 10 mL      heparin lock flush 100 unit/mL      heparin lock flush 100 unit/mL      alteplase (CATHFLO ACTIVASE) injection 2 mg      sodium chloride 0.9 % injection 3 mL      Hot Pack 1 packet      0.9 %  sodium chloride infusion      TREATMENT CONDITIONS 04/04/2014      SCHEDULING COMMUNICATION      prochlorperazine (COMPAZINE) tablet 10 mg      etoposide (VEPESID) 210 mg in sodium chloride 0.9 % 600 mL chemo infusion      sodium chloride 0.9 % injection 10 mL      heparin lock flush 100 unit/mL      heparin lock flush 100 unit/mL      alteplase (CATHFLO ACTIVASE) injection 2 mg      sodium chloride 0.9 % injection 3 mL      Hot Pack 1 packet  0.9 %  sodium chloride infusion 04/05/2014      SCHEDULING COMMUNICATION      prochlorperazine (COMPAZINE) tablet 10 mg      etoposide (VEPESID) 210 mg in sodium chloride 0.9 % 600 mL chemo infusion      sodium chloride 0.9 % injection 10 mL      heparin lock flush 100 unit/mL      heparin lock flush 100 unit/mL      alteplase (CATHFLO ACTIVASE) injection 2 mg      sodium chloride 0.9 % injection 3 mL      Hot Pack 1 packet      0.9 %  sodium chloride infusion    Review of Systems  Constitutional: Positive for fever, chills and malaise/fatigue. Negative for diaphoresis.  HENT: Negative for congestion, ear pain and sore throat.   Eyes: Negative.   Respiratory: Negative.   Cardiovascular: Negative.   Gastrointestinal: Negative.   Genitourinary: Negative.   Musculoskeletal: Positive for myalgias. Negative for back pain, joint pain and neck pain.  Skin: Negative.   Neurological: Positive for headaches. Negative for weakness.  Endo/Heme/Allergies: Negative.   Psychiatric/Behavioral: Negative.   All other systems reviewed and are negative.   Past Medical History  Diagnosis Date  . HIV infection     DR. [name]  . Hyperlipidemia   . Insomnia   . Lung nodule, multiple 10/24/13    CT CHEST W/O...DR. [name]  . Gynecomastia, male 10/16/13    MILD RIGHT SIDED PER RADIOLOGY STUDY  . COPD (chronic obstructive pulmonary disease)     PFT'S 10/26/13 @ [facility]  . History of depression   . History of pneumonia   . Small cell lung cancer   . Collapsed lung     d/t biopsy, right    Past Surgical History  Procedure Laterality Date  . Foot surgery      BUNIONECTOMY  . Back surgery      L 4-5 FUSION WITH SCREW  . Colonoscopy  UTD  . Portacath placement N/A 12/08/2013    Procedure: INSERTION PORT-A-CATH;  Surgeon: Grace Isaac, MD;  Location: [facility];  Service: Thoracic;  Laterality: N/A;    has HIV INFECTION; HSV; DEPRESSION; HYPERTENSION; COPD; ECZEMA, ATOPIC; ACQUIRED  SPONDYLOLISTHESIS; INSOMNIA; TB SKIN TEST, POSITIVE; ALCOHOL ABUSE, HX OF; SMOKER; COPD (chronic obstructive pulmonary disease); Lung nodule, multiple; Gynecomastia, male; Pneumothorax of right lung after biopsy; Pneumothorax; Small cell carcinoma of lung; Cramping of hands; Diarrhea; Fatigue; Dehydration; and Fever on his problem list.     is allergic to lactose intolerance (gi).    Medication List       This list is accurate as of: 03/27/14  5:50 PM.  Always use your most recent med list.               doxylamine (Sleep) 25 MG tablet  Commonly known as:  UNISOM  Take 50 mg by mouth at bedtime as needed for sleep.     efavirenz-emtricitabine-tenofovir 600-200-300 MG per tablet  Commonly known as:  ATRIPLA  Take 1 tablet by mouth daily.     fluticasone 50 MCG/ACT nasal spray  Commonly known as:  FLONASE  Place 2 sprays into both nostrils daily as needed for allergies.     HYDROcodone-acetaminophen 5-325 MG per tablet  Commonly known as:  LORTAB  Take 1-2 tablets by mouth every 6 (six) hours as needed for moderate pain.     levofloxacin 500 MG tablet  Commonly known as:  LEVAQUIN  Take 1 tablet by mouth daily for 5 days.     lidocaine-prilocaine cream  Commonly known as:  EMLA  Apply 1 application topically as needed. Apply to port 1 hour before chemo appointment.     Melatonin 10 MG Tabs  Take 10 mg by mouth at bedtime as needed (sleep).     mirtazapine 15 MG tablet  Commonly known as:  REMERON  Take 1 tablet (15 mg total) by mouth at bedtime.     PROAIR HFA 108 (90 BASE) MCG/ACT inhaler  Generic drug:  albuterol  Inhale 2 puffs into the lungs every 4 (four) hours as needed for wheezing or shortness of breath.     prochlorperazine 10 MG tablet  Commonly known as:  COMPAZINE  Take 1 tablet (10 mg total) by mouth every 6 (six) hours as needed for nausea or vomiting.     varenicline 1 MG tablet  Commonly known as:  CHANTIX  Take 1 mg by mouth 2 (two) times daily.           PHYSICAL EXAMINATION  Blood pressure 155/84, pulse  108, temperature 99.1 F (37.3 C), temperature source Oral, resp. rate 19, height 5\' 10"  (1.778 m), weight 189 lb 4.8 oz (85.866 kg).  Physical Exam  LABORATORY DATA:. Appointment on 03/27/2014  Component Date Value Ref Range Status  . WBC 03/27/2014 15.3* 4.0 - 10.3 10e3/uL Final  . NEUT# 03/27/2014 12.8* 1.5 - 6.5 10e3/uL Final  . HGB 03/27/2014 12.8* 13.0 - 17.1 g/dL Final  . HCT 03/27/2014 39.6  38.4 - 49.9 % Final  . Platelets 03/27/2014 130* 140 - 400 10e3/uL Final  . MCV 03/27/2014 99.2* 79.3 - 98.0 fL Final  . MCH 03/27/2014 32.2  27.2 - 33.4 pg Final  . MCHC 03/27/2014 32.4  32.0 - 36.0 g/dL Final  . RBC 03/27/2014 3.99* 4.20 - 5.82 10e6/uL Final  . RDW 03/27/2014 16.1* 11.0 - 14.6 % Final  . lymph# 03/27/2014 1.6  0.9 - 3.3 10e3/uL Final  . MONO# 03/27/2014 0.8  0.1 - 0.9 10e3/uL Final  . Eosinophils Absolute 03/27/2014 0.0  0.0 - 0.5 10e3/uL Final  . Basophils Absolute 03/27/2014 0.0  0.0 - 0.1 10e3/uL Final  . NEUT% 03/27/2014 83.6* 39.0 - 75.0 % Final  . LYMPH% 03/27/2014 10.7* 14.0 - 49.0 % Final  . MONO% 03/27/2014 5.4  0.0 - 14.0 % Final  . EOS% 03/27/2014 0.1  0.0 - 7.0 % Final  . BASO% 03/27/2014 0.2  0.0 - 2.0 % Final  . Sodium 03/27/2014 137  136 - 145 mEq/L Final  . Potassium 03/27/2014 4.7  3.5 - 5.1 mEq/L Final  . Chloride 03/27/2014 104  98 - 109 mEq/L Final  . CO2 03/27/2014 23  22 - 29 mEq/L Final  . Glucose 03/27/2014 108  70 - 140 mg/dl Final  . BUN 03/27/2014 6.3* 7.0 - 26.0 mg/dL Final  . Creatinine 03/27/2014 0.9  0.7 - 1.3 mg/dL Final  . Total Bilirubin 03/27/2014 0.45  0.20 - 1.20 mg/dL Final  . Alkaline Phosphatase 03/27/2014 135  40 - 150 U/L Final  . AST 03/27/2014 16  5 - 34 U/L Final  . ALT 03/27/2014 20  0 - 55 U/L Final  . Total Protein 03/27/2014 8.0  6.4 - 8.3 g/dL Final  . Albumin 03/27/2014 3.6  3.5 - 5.0 g/dL Final  . Calcium 03/27/2014 9.8  8.4 - 10.4 mg/dL Final  .  Anion Gap 03/27/2014 9  3 - 11 mEq/L Final  . Glucose 03/27/2014 Negative  Negative mg/dL Final  . Bilirubin (Urine) 03/27/2014 Negative  Negative Final  . Ketones 03/27/2014 Negative  Negative mg/dL Final  . Specific Gravity, Urine 03/27/2014 1.015  1.003 - 1.035 Final  . Blood 03/27/2014 Moderate  Negative Final  . pH 03/27/2014 6.0  4.6 - 8.0 Final  . Protein 03/27/2014 Negative  Negative- <30 mg/dL Final  . Urobilinogen, UR 03/27/2014 0.2  0.2 - 1 mg/dL Final  . Nitrite 03/27/2014 Negative  Negative Final  . Leukocyte Esterase 03/27/2014 Negative  Negative Final  . RBC / HPF 03/27/2014 7-10  0 - 2 Final  . WBC, UA 03/27/2014 0-2  0 - 2 Final  . Epithelial Cells 03/27/2014 Occasional  Negative- Few Final  . Amorphous, UA 03/27/2014 Large  Negative- Small Final     RADIOGRAPHIC STUDIES: Pending CXR results. ASSESSMENT/PLAN:    Small cell carcinoma of lung  Assessment & Plan Patient  initiated cycle 5 of his carboplatin/etoposide chemotherapy regimen on 03/13/2014. He is scheduled to start cycle 6 of the same regimen on 04/03/2014.  He is  off 1 week his chemotherapy regimen due to traveling recently.  We will cancel that were previously scheduled to be drawn on 03/29/2014; since they were just drawn today.     Fatigue  Assessment & Plan Most likely, patient's chronic fatigue is a symptom of his lung cancer; as well as chemotherapy side effect.  Patient was encouraged to remain as active as possible.   Dehydration  Assessment & Plan Patient does appear dehydrated today.  Patient will receive 1 L normal saline IV fluid rehydration today.  He was also encouraged to push fluids is much as possible.   Fever  Assessment & Plan  Patient has had a fever intermittently for the past few days to a maximum of 100.1.  Patient states that Tylenol has been ineffective in treating his fever.  White count slightly elevated; but most likely secondary to recent Neulasta injection.   Blood cultures x2 were drawn; but no blood culture from Port-A-Cath.  Urinalysis today was within normal limits; the exception of some moderate blood.  Pending chest x-ray results.  Patient was initiated on Levaquin 500 mg daily for a total of 5 days; and will followup regarding chest x-ray, urine culture, and blood cultures.  Patient was strongly encouraged to call or go directed to the emergency department overnight he develops any new or worsening symptoms whatsoever.   Patient stated understanding of all instructions; and was in agreement with this plan of care. The patient knows to call the clinic with any problems, questions or concerns.   This was a shared visit with Dr. Julien Nordmann today.   Total time spent with patient was 25 minutes;  with greater than 75 percent of that time spent in face to face counseling regarding his symptoms, and coordination of care and follow up.  Disclaimer: This note was dictated with voice recognition software. Similar sounding words can inadvertently be transcribed and may not be corrected upon review.   Drue Second, NP 03/27/2014   ADDENDUM: Hematology/Oncology Attending: I had a face to face encounter with the patient today. I recommended his care plan. This is a very pleasant 53 years old [place] American male with extensive stage small cell lung cancer currently undergoing systemic chemotherapy with carboplatin and etoposide status post 5 cycles. The patient just came back home from a trip to [place].  He presented for a walk-in visit complaining of worsening fatigue and weakness as well as chills. He has low-grade fever of 99.1. His CBC showed leukocytosis which could be secondary to his treatment with Neulasta versus new infection. I recommended for the patient to have chest x-ray today as well as urinalysis. We will start him empirically on Levaquin 500 mg by mouth daily for the next 5 days. The patient would also receive IV hydration at the [facility] today with normal saline. He was advised to go immediately to the emergency department if he has no improvement or has worsening in his condition and the next 1-2 days. He would come back for followup visit as previously. He was advised to call immediately if he has any other concerning symptoms in the interval.  Disclaimer: This note was dictated with voice recognition software. Similar sounding words can inadvertently be transcribed and may be missed upon review. Eilleen Kempf., MD 03/27/2014

## 2014-03-27 NOTE — Assessment & Plan Note (Signed)
Patient  initiated cycle 5 of his carboplatin/etoposide chemotherapy regimen on 03/13/2014. He is scheduled to start cycle 6 of the same regimen on 04/03/2014.  He is off 1 week his chemotherapy regimen due to traveling recently.  We will cancel that were previously scheduled to be drawn on 03/29/2014; since they were just drawn today.

## 2014-03-28 ENCOUNTER — Telehealth: Payer: Self-pay | Admitting: *Deleted

## 2014-03-28 ENCOUNTER — Encounter: Payer: Self-pay | Admitting: Nurse Practitioner

## 2014-03-28 ENCOUNTER — Ambulatory Visit: Payer: 59 | Admitting: Physical Therapy

## 2014-03-28 LAB — URINE CULTURE

## 2014-03-28 NOTE — Telephone Encounter (Signed)
Per Selena Lesser, NP called pt to see how he was feeling today. Pt states that his last temperature reading was 99.6 at 9:45 am today. He says he is feeling a little bit better but wonders if he needs to be admitted especially since there was blood in his urine. Told patient I would talk with Cyndee and call him back.   After speaking with Cyndee, called patient back and let him know that Dr. Julien Nordmann is aware of the blood in his urine and that solely is not a reason to be admitted. Encouraged patient to continue drinking lots of fluids and to take tylenol as needed for a fever. Told him again that if his fever is greater than 100.5 or he begins to feel worse to please call us or go to the emergency room. Patient agreeable to this.

## 2014-03-28 NOTE — Telephone Encounter (Signed)
Called patient after receiving his MyChart message regarding having a fever. Has not taken Tylenol since 8pm last night. Has only had one dose of Levaquin (last night). He is not short of breath and port looks fine. Counts good-had Neulasta. Instructed him to take Tylenol and CALL, not email if it does not respond to Tylenol or he gets short of breath or worsens and take your next dose of Levaquin now. He understands and agrees. Reviewed this with Dr. Lona Kettle, who agrees to this advice. Triage nurse will call and check on him tomorrow.

## 2014-03-29 ENCOUNTER — Telehealth: Payer: Self-pay | Admitting: *Deleted

## 2014-03-29 ENCOUNTER — Other Ambulatory Visit: Payer: 59

## 2014-03-29 NOTE — Telephone Encounter (Signed)
THIS MORNING PT. HAD A TEMPERATURE OF 100.4. HE TOOK TYLENOL AND HIS TEMPERATURE IS 98.6. PT. HAS HAD A MILD INTERMITTED HEADACHE AND IS VERY TIRED. ENCOURAGED PT. TO REST AND CONTINUE FORCING FLUIDS.

## 2014-03-29 NOTE — Telephone Encounter (Signed)
REQUESTED A RETURN CALL TO THIS OFFICE.

## 2014-03-29 NOTE — Telephone Encounter (Signed)
NO NOTE

## 2014-03-30 ENCOUNTER — Telehealth: Payer: Self-pay | Admitting: Medical Oncology

## 2014-03-30 NOTE — Telephone Encounter (Signed)
F/u call to see how pt is feeling. No answer on mobile phone or home phone. I left message to call me back.

## 2014-04-02 ENCOUNTER — Ambulatory Visit: Payer: 59 | Attending: Internal Medicine | Admitting: Physical Therapy

## 2014-04-02 DIAGNOSIS — R530 Neoplastic (malignant) related fatigue: Secondary | ICD-10-CM | POA: Diagnosis not present

## 2014-04-02 DIAGNOSIS — C3490 Malignant neoplasm of unspecified part of unspecified bronchus or lung: Secondary | ICD-10-CM | POA: Diagnosis not present

## 2014-04-02 LAB — CULTURE, BLOOD (SINGLE)

## 2014-04-03 ENCOUNTER — Ambulatory Visit (HOSPITAL_BASED_OUTPATIENT_CLINIC_OR_DEPARTMENT_OTHER): Payer: 59 | Admitting: Internal Medicine

## 2014-04-03 ENCOUNTER — Ambulatory Visit: Payer: 59

## 2014-04-03 ENCOUNTER — Telehealth: Payer: Self-pay | Admitting: Internal Medicine

## 2014-04-03 ENCOUNTER — Other Ambulatory Visit (HOSPITAL_BASED_OUTPATIENT_CLINIC_OR_DEPARTMENT_OTHER): Payer: 59

## 2014-04-03 ENCOUNTER — Encounter: Payer: Self-pay | Admitting: Internal Medicine

## 2014-04-03 VITALS — BP 128/63 | HR 92 | Temp 98.1°F | Resp 19 | Ht 70.0 in | Wt 193.5 lb

## 2014-04-03 DIAGNOSIS — J189 Pneumonia, unspecified organism: Secondary | ICD-10-CM

## 2014-04-03 DIAGNOSIS — J181 Lobar pneumonia, unspecified organism: Secondary | ICD-10-CM

## 2014-04-03 DIAGNOSIS — C3490 Malignant neoplasm of unspecified part of unspecified bronchus or lung: Secondary | ICD-10-CM

## 2014-04-03 DIAGNOSIS — Z95828 Presence of other vascular implants and grafts: Secondary | ICD-10-CM

## 2014-04-03 DIAGNOSIS — C3410 Malignant neoplasm of upper lobe, unspecified bronchus or lung: Secondary | ICD-10-CM

## 2014-04-03 DIAGNOSIS — C3491 Malignant neoplasm of unspecified part of right bronchus or lung: Secondary | ICD-10-CM

## 2014-04-03 DIAGNOSIS — R5382 Chronic fatigue, unspecified: Secondary | ICD-10-CM

## 2014-04-03 LAB — COMPREHENSIVE METABOLIC PANEL (CC13)
ALBUMIN: 2.9 g/dL — AB (ref 3.5–5.0)
ALT: 31 U/L (ref 0–55)
ANION GAP: 9 meq/L (ref 3–11)
AST: 24 U/L (ref 5–34)
Alkaline Phosphatase: 95 U/L (ref 40–150)
BUN: 8.3 mg/dL (ref 7.0–26.0)
CALCIUM: 9.3 mg/dL (ref 8.4–10.4)
CHLORIDE: 109 meq/L (ref 98–109)
CO2: 21 mEq/L — ABNORMAL LOW (ref 22–29)
Creatinine: 0.7 mg/dL (ref 0.7–1.3)
GLUCOSE: 104 mg/dL (ref 70–140)
Potassium: 4.4 mEq/L (ref 3.5–5.1)
Sodium: 139 mEq/L (ref 136–145)
Total Bilirubin: <0.2 mg/dL (ref 0.20–1.20)
Total Protein: 7 g/dL (ref 6.4–8.3)

## 2014-04-03 LAB — CBC WITH DIFFERENTIAL/PLATELET
BASO%: 1.5 % (ref 0.0–2.0)
BASOS ABS: 0.1 10*3/uL (ref 0.0–0.1)
EOS ABS: 0.1 10*3/uL (ref 0.0–0.5)
EOS%: 0.7 % (ref 0.0–7.0)
HEMATOCRIT: 36.7 % — AB (ref 38.4–49.9)
HEMOGLOBIN: 12.1 g/dL — AB (ref 13.0–17.1)
LYMPH#: 1.7 10*3/uL (ref 0.9–3.3)
LYMPH%: 20.1 % (ref 14.0–49.0)
MCH: 32.3 pg (ref 27.2–33.4)
MCHC: 32.9 g/dL (ref 32.0–36.0)
MCV: 98.2 fL — ABNORMAL HIGH (ref 79.3–98.0)
MONO#: 0.6 10*3/uL (ref 0.1–0.9)
MONO%: 7.4 % (ref 0.0–14.0)
NEUT#: 5.9 10*3/uL (ref 1.5–6.5)
NEUT%: 70.3 % (ref 39.0–75.0)
Platelets: 428 10*3/uL — ABNORMAL HIGH (ref 140–400)
RBC: 3.74 10*6/uL — ABNORMAL LOW (ref 4.20–5.82)
RDW: 15.6 % — ABNORMAL HIGH (ref 11.0–14.6)
WBC: 8.5 10*3/uL (ref 4.0–10.3)

## 2014-04-03 MED ORDER — HEPARIN SOD (PORK) LOCK FLUSH 100 UNIT/ML IV SOLN
500.0000 [IU] | Freq: Once | INTRAVENOUS | Status: AC
  Administered 2014-04-03: 500 [IU] via INTRAVENOUS
  Filled 2014-04-03: qty 5

## 2014-04-03 MED ORDER — SODIUM CHLORIDE 0.9 % IJ SOLN
10.0000 mL | INTRAMUSCULAR | Status: DC | PRN
  Administered 2014-04-03: 10 mL via INTRAVENOUS
  Filled 2014-04-03: qty 10

## 2014-04-03 MED ORDER — LEVOFLOXACIN 500 MG PO TABS: ORAL_TABLET | ORAL | Status: DC

## 2014-04-03 NOTE — Patient Instructions (Signed)
Smoking Cessation, Tips for Success  If you are ready to quit smoking, congratulations! You have chosen to help yourself be healthier. Cigarettes bring nicotine, tar, carbon monoxide, and other irritants into your body. Your lungs, heart, and blood vessels will be able to work better without these poisons. There are many different ways to quit smoking. Nicotine gum, nicotine patches, a nicotine inhaler, or nicotine nasal spray can help with physical craving. Hypnosis, support groups, and medicines help break the habit of smoking.  WHAT THINGS CAN I DO TO MAKE QUITTING EASIER?   Here are some tips to help you quit for good:  · Pick a date when you will quit smoking completely. Tell all of your friends and family about your plan to quit on that date.  · Do not try to slowly cut down on the number of cigarettes you are smoking. Pick a quit date and quit smoking completely starting on that day.  · Throw away all cigarettes.    · Clean and remove all ashtrays from your home, work, and car.  · On a card, write down your reasons for quitting. Carry the card with you and read it when you get the urge to smoke.  · Cleanse your body of nicotine. Drink enough water and fluids to keep your urine clear or pale yellow. Do this after quitting to flush the nicotine from your body.  · Learn to predict your moods. Do not let a bad situation be your excuse to have a cigarette. Some situations in your life might tempt you into wanting a cigarette.  · Never have "just one" cigarette. It leads to wanting another and another. Remind yourself of your decision to quit.  · Change habits associated with smoking. If you smoked while driving or when feeling stressed, try other activities to replace smoking. Stand up when drinking your coffee. Brush your teeth after eating. Sit in a different chair when you read the paper. Avoid alcohol while trying to quit, and try to drink fewer caffeinated beverages. Alcohol and caffeine may urge you to  smoke.  · Avoid foods and drinks that can trigger a desire to smoke, such as sugary or spicy foods and alcohol.  · Ask people who smoke not to smoke around you.  · Have something planned to do right after eating or having a cup of coffee. For example, plan to take a walk or exercise.  · Try a relaxation exercise to calm you down and decrease your stress. Remember, you may be tense and nervous for the first 2 weeks after you quit, but this will pass.  · Find new activities to keep your hands busy. Play with a pen, coin, or rubber band. Doodle or draw things on paper.  · Brush your teeth right after eating. This will help cut down on the craving for the taste of tobacco after meals. You can also try mouthwash.    · Use oral substitutes in place of cigarettes. Try using lemon drops, carrots, cinnamon sticks, or chewing gum. Keep them handy so they are available when you have the urge to smoke.  · When you have the urge to smoke, try deep breathing.  · Designate your home as a nonsmoking area.  · If you are a heavy smoker, ask your health care provider about a prescription for nicotine chewing gum. It can ease your withdrawal from nicotine.  · Reward yourself. Set aside the cigarette money you save and buy yourself something nice.  · Look for   support from others. Join a support group or smoking cessation program. Ask someone at home or at work to help you with your plan to quit smoking.  · Always ask yourself, "Do I need this cigarette or is this just a reflex?" Tell yourself, "Today, I choose not to smoke," or "I do not want to smoke." You are reminding yourself of your decision to quit.  · Do not replace cigarette smoking with electronic cigarettes (commonly called e-cigarettes). The safety of e-cigarettes is unknown, and some may contain harmful chemicals.  · If you relapse, do not give up! Plan ahead and think about what you will do the next time you get the urge to smoke.  HOW WILL I FEEL WHEN I QUIT SMOKING?  You  may have symptoms of withdrawal because your body is used to nicotine (the addictive substance in cigarettes). You may crave cigarettes, be irritable, feel very hungry, cough often, get headaches, or have difficulty concentrating. The withdrawal symptoms are only temporary. They are strongest when you first quit but will go away within 10-14 days. When withdrawal symptoms occur, stay in control. Think about your reasons for quitting. Remind yourself that these are signs that your body is healing and getting used to being without cigarettes. Remember that withdrawal symptoms are easier to treat than the major diseases that smoking can cause.   Even after the withdrawal is over, expect periodic urges to smoke. However, these cravings are generally short lived and will go away whether you smoke or not. Do not smoke!  WHAT RESOURCES ARE AVAILABLE TO HELP ME QUIT SMOKING?  Your health care provider can direct you to community resources or hospitals for support, which may include:  · Group support.  · Education.  · Hypnosis.  · Therapy.  Document Released: 02/28/2004 Document Revised: 10/16/2013 Document Reviewed: 11/17/2012  [employer]® Patient Information ©2015 [employer]. This information is not intended to replace advice given to you by your health care provider. Make sure you discuss any questions you have with your health care provider.

## 2014-04-03 NOTE — Patient Instructions (Signed)

## 2014-04-03 NOTE — Telephone Encounter (Signed)
gv and printed appt sched and avs for pt for OCT and NVO....sed added tx.Marland KitchenMarland KitchenMarland KitchenMarland Kitchenpt sched to see Dr. Elsworth Soho on 11.30.15 @ 2:15pm

## 2014-04-03 NOTE — Progress Notes (Signed)
[name] [phone]   [phone]) [phone]  OFFICE PROGRESS NOTE  Irven Shelling, MD [employer], [address] [place] [address]  DIAGNOSIS: Small cell carcinoma of lung, Extensive stage  Primary site: Lung (Right)  Staging method: AJCC 7th Edition  Clinical: Stage IV (T1a, N1, M1b) signed by Curt Bears, MD on 12/07/2013 3:19 PM  Summary: Stage IV (T1a, N1, M1b)   PRIOR THERAPY: none   CURRENT THERAPY: Systemic chemotherapy with carboplatin for AUC of 5 given on day 1, etoposide 100 mg/m2 given on days 1, 2 an3 with neulast support on day 4. Status post 5 cycles.   DISEASE STAGE:  Small cell carcinoma of lung, Extensive stage  Primary site: Lung (Right)  Staging method: AJCC 7th Edition  Clinical: Stage IV (T1a, N1, M1b) signed by Curt Bears, MD on 12/07/2013 3:19 PM  Summary: Stage IV (T1a, N1, M1b)  CHEMOTHERAPY INTENT: pallative  CURRENT # OF CHEMOTHERAPY CYCLES: 5 CURRENT ANTIEMETICS: Zofran, dexamethasone and compazine  CURRENT SMOKING STATUS: Former smoker, quit 12/18/2013  ORAL CHEMOTHERAPY AND CONSENT: n/a  CURRENT BISPHOSPHONATES USE: none  PAIN MANAGEMENT: Lortab 5/325 mg  NARCOTICS INDUCED CONSTIPATION: none  LIVING WILL AND CODE STATUS: ?   INTERVAL HISTORY: Jaime Morris 53 y.o. male returns to the clinic today for followup visit. The patient tolerated the second cycle of his systemic chemotherapy with carboplatin and etoposide fairly well. He was in [place] 10 days ago and came back home complaining of fever and chills as well as increasing fatigue and weakness. He was started on treatment with Levaquin 500 mg by mouth daily for 5 days. He felt a little bit better but not complete resolution of his symptoms. He still complaining of increasing fatigue and weakness but denied having any significant fever or chills. He denied having any significant weight loss or night sweats. He has no chest pain, shortness  of breath, cough or hemoptysis. His no significant weight loss or night sweats. He was supposed to start cycle #6 today.  MEDICAL HISTORY: Past Medical History  Diagnosis Date  . HIV infection     DR. [name]  . Hyperlipidemia   . Insomnia   . Lung nodule, multiple 10/24/13    CT CHEST W/O...DR. [name]  . Gynecomastia, male 10/16/13    MILD RIGHT SIDED PER RADIOLOGY STUDY  . COPD (chronic obstructive pulmonary disease)     PFT'S 10/26/13 @ [facility]  . History of depression   . History of pneumonia   . Small cell lung cancer   . Collapsed lung     d/t biopsy, right    ALLERGIES:  is allergic to lactose intolerance (gi).  MEDICATIONS:  Current Outpatient Prescriptions  Medication Sig Dispense Refill  . albuterol (PROAIR HFA) 108 (90 BASE) MCG/ACT inhaler Inhale 2 puffs into the lungs every 4 (four) hours as needed for wheezing or shortness of breath.       . doxylamine, Sleep, (UNISOM) 25 MG tablet Take 50 mg by mouth at bedtime as needed for sleep.       Marland Kitchen efavirenz-emtricitabine-tenofovir (ATRIPLA) 600-200-300 MG per tablet Take 1 tablet by mouth daily.  90 tablet  3  . fluticasone (FLONASE) 50 MCG/ACT nasal spray Place 2 sprays into both nostrils daily as needed for allergies.       Marland Kitchen HYDROcodone-acetaminophen (LORTAB) 5-325 MG per tablet Take 1-2 tablets by mouth every 6 (six) hours as needed for moderate pain.  [place]  tablet  0  . lidocaine-prilocaine (EMLA) cream Apply 1 application topically as needed. Apply to port 1 hour before chemo appointment.  30 g  0  . Melatonin 10 MG TABS Take 10 mg by mouth at bedtime as needed (sleep).      . mirtazapine (REMERON) 15 MG tablet Take 1 tablet (15 mg total) by mouth at bedtime.  90 tablet  1  . prochlorperazine (COMPAZINE) 10 MG tablet Take 1 tablet (10 mg total) by mouth every 6 (six) hours as needed for nausea or vomiting.  30 tablet  1  . varenicline (CHANTIX) 1 MG tablet Take 1 mg by mouth 2 (two) times daily.        No current  facility-administered medications for this visit.   Facility-Administered Medications Ordered in Other Visits  Medication Dose Route Frequency Provider Last Rate Last Dose  . sodium chloride 0.9 % injection 10 mL  10 mL Intracatheter PRN Curt Bears, MD   10 mL at 01/30/14 1451    SURGICAL HISTORY:  Past Surgical History  Procedure Laterality Date  . Foot surgery      BUNIONECTOMY  . Back surgery      L 4-5 FUSION WITH SCREW  . Colonoscopy  UTD  . Portacath placement N/A 12/08/2013    Procedure: INSERTION PORT-A-CATH;  Surgeon: Grace Isaac, MD;  Location: [facility];  Service: Thoracic;  Laterality: N/A;    REVIEW OF SYSTEMS:  Constitutional: positive for fatigue Eyes: negative Ears, nose, mouth, throat, and face: negative Respiratory: negative Cardiovascular: negative Gastrointestinal: negative Genitourinary:negative Integument/breast: negative Hematologic/lymphatic: negative Musculoskeletal:negative Neurological: negative Behavioral/Psych: negative Endocrine: negative Allergic/Immunologic: negative   PHYSICAL EXAMINATION: General appearance: alert, cooperative, fatigued and no distress Head: Normocephalic, without obvious abnormality, atraumatic Neck: no adenopathy, no JVD, supple, symmetrical, trachea midline and thyroid not enlarged, symmetric, no tenderness/mass/nodules Lymph nodes: Cervical, supraclavicular, and axillary nodes normal. Resp: clear to auscultation bilaterally Back: symmetric, no curvature. ROM normal. No CVA tenderness. Cardio: regular rate and rhythm, S1, S2 normal, no murmur, click, rub or gallop GI: soft, non-tender; bowel sounds normal; no masses,  no organomegaly Extremities: extremities normal, atraumatic, no cyanosis or edema Neurologic: Alert and oriented X 3, normal strength and tone. Normal symmetric reflexes. Normal coordination and gait  ECOG PERFORMANCE STATUS: 1 - Symptomatic but completely ambulatory  Blood pressure 128/63, pulse  92, temperature 98.1 F (36.7 C), temperature source Oral, resp. rate 19, height 5\' 10"  (1.778 m), weight 193 lb 8 oz (87.771 kg), SpO2 100.00%.  LABORATORY DATA: Lab Results  Component Value Date   WBC 8.5 04/03/2014   HGB 12.1* 04/03/2014   HCT 36.7* 04/03/2014   MCV 98.2* 04/03/2014   PLT 428* 04/03/2014      Chemistry      Component Value Date/Time   NA 139 04/03/2014 1013   NA 134* 01/22/2014 0955   K 4.4 04/03/2014 1013   K 4.2 01/22/2014 0955   CL 102 01/22/2014 0955   CO2 21* 04/03/2014 1013   CO2 23 01/22/2014 0955   BUN 8.3 04/03/2014 1013   BUN 12 01/22/2014 0955   CREATININE 0.7 04/03/2014 1013   CREATININE 0.97 01/22/2014 0955   CREATININE 0.79 10/23/2013 1112      Component Value Date/Time   CALCIUM 9.3 04/03/2014 1013   CALCIUM 9.6 01/22/2014 0955   ALKPHOS 95 04/03/2014 1013   ALKPHOS 138* 01/22/2014 0955   AST 24 04/03/2014 1013   AST 23 01/22/2014 0955   ALT 31 04/03/2014 1013  ALT 28 01/22/2014 0955   BILITOT <0.20 04/03/2014 1013   BILITOT 0.5 01/22/2014 0955       RADIOGRAPHIC STUDIES: Dg Chest 2 View  03/27/2014   CLINICAL DATA:  Fever for 2 days. Patient currently on chemotherapy. Small cell carcinoma of the lung.  fever, cough  EXAM: CHEST  2 VIEW  COMPARISON:  CT 02/16/2014.  Radiographs 12/08/2013.  FINDINGS: Cardiopericardial silhouette is within normal limits. Port-A-Cath from LEFT subclavian approach terminating in the mid SVC. There is new airspace disease in the RIGHT middle lobe, compatible with pneumonia. This respect the minor fissure. No pleural effusion. Severe emphysema.  IMPRESSION: RIGHT middle lobe airspace disease, most compatible with pneumonia.   Electronically Signed   By: Dereck Ligas M.D.   On: 03/27/2014 18:19   ASSESSMENT AND PLAN:  This is a very pleasant 53 years old [place] American male recently diagnosed with:  1) Extensive stage small cell lung cancer: Currently undergoing systemic chemotherapy with carboplatin and  etoposide status post 5 cycles. He has been tolerating his treatment fairly well except for the recent pneumonia after his travel to [place]. I will delay the start of cycle #6 by 1 week. He would come back for followup visit in 4 weeks with repeat CT scan of the chest, abdomen and pelvis for restaging of his disease.  2) right middle lobe pneumonia: He was treated with 5 days of Levaquin but he has not completely improved. I recommended for him treatment with 5 more days of Levaquin 500 mg by mouth daily.  He was advised to call immediately if he has any concerning symptoms in the interval. The patient voices understanding of current disease status and treatment options and is in agreement with the current care plan.  All questions were answered. The patient knows to call the clinic with any problems, questions or concerns. We can certainly see the patient much sooner if necessary.  Disclaimer: This note was dictated with voice recognition software. Similar sounding words can inadvertently be transcribed and may not be corrected upon review.

## 2014-04-04 ENCOUNTER — Ambulatory Visit: Payer: 59

## 2014-04-04 ENCOUNTER — Encounter: Payer: Self-pay | Admitting: Internal Medicine

## 2014-04-04 NOTE — Progress Notes (Signed)
Put disability form on nurse's desk. °

## 2014-04-05 ENCOUNTER — Encounter: Payer: Self-pay | Admitting: Internal Medicine

## 2014-04-05 ENCOUNTER — Ambulatory Visit: Payer: 59

## 2014-04-05 ENCOUNTER — Encounter: Payer: 59 | Admitting: Nutrition

## 2014-04-05 NOTE — Progress Notes (Signed)
Faxed disability form to [facility] @ [phone]

## 2014-04-06 ENCOUNTER — Ambulatory Visit: Payer: 59

## 2014-04-10 ENCOUNTER — Other Ambulatory Visit (HOSPITAL_BASED_OUTPATIENT_CLINIC_OR_DEPARTMENT_OTHER): Payer: 59

## 2014-04-10 ENCOUNTER — Ambulatory Visit: Payer: 59

## 2014-04-10 ENCOUNTER — Other Ambulatory Visit: Payer: 59

## 2014-04-10 ENCOUNTER — Ambulatory Visit (HOSPITAL_BASED_OUTPATIENT_CLINIC_OR_DEPARTMENT_OTHER): Payer: 59

## 2014-04-10 VITALS — BP 140/60 | HR 90 | Temp 98.2°F | Resp 18

## 2014-04-10 DIAGNOSIS — Z5111 Encounter for antineoplastic chemotherapy: Secondary | ICD-10-CM | POA: Diagnosis not present

## 2014-04-10 DIAGNOSIS — C3411 Malignant neoplasm of upper lobe, right bronchus or lung: Secondary | ICD-10-CM | POA: Diagnosis not present

## 2014-04-10 DIAGNOSIS — C3491 Malignant neoplasm of unspecified part of right bronchus or lung: Secondary | ICD-10-CM

## 2014-04-10 DIAGNOSIS — Z95828 Presence of other vascular implants and grafts: Secondary | ICD-10-CM

## 2014-04-10 LAB — COMPREHENSIVE METABOLIC PANEL (CC13)
ALK PHOS: 121 U/L (ref 40–150)
ALT: 35 U/L (ref 0–55)
AST: 29 U/L (ref 5–34)
Albumin: 3.4 g/dL — ABNORMAL LOW (ref 3.5–5.0)
Anion Gap: 8 mEq/L (ref 3–11)
BUN: 9.9 mg/dL (ref 7.0–26.0)
CO2: 22 mEq/L (ref 22–29)
Calcium: 9.4 mg/dL (ref 8.4–10.4)
Chloride: 107 mEq/L (ref 98–109)
Creatinine: 0.8 mg/dL (ref 0.7–1.3)
Glucose: 95 mg/dl (ref 70–140)
Potassium: 4.5 mEq/L (ref 3.5–5.1)
Sodium: 136 mEq/L (ref 136–145)
Total Protein: 7.4 g/dL (ref 6.4–8.3)

## 2014-04-10 LAB — CBC WITH DIFFERENTIAL/PLATELET
BASO%: 0.9 % (ref 0.0–2.0)
Basophils Absolute: 0.1 10*3/uL (ref 0.0–0.1)
EOS%: 2.4 % (ref 0.0–7.0)
Eosinophils Absolute: 0.2 10*3/uL (ref 0.0–0.5)
HCT: 41.5 % (ref 38.4–49.9)
HGB: 13.5 g/dL (ref 13.0–17.1)
LYMPH%: 27.4 % (ref 14.0–49.0)
MCH: 31.9 pg (ref 27.2–33.4)
MCHC: 32.5 g/dL (ref 32.0–36.0)
MCV: 98.2 fL — AB (ref 79.3–98.0)
MONO#: 0.7 10*3/uL (ref 0.1–0.9)
MONO%: 10.9 % (ref 0.0–14.0)
NEUT#: 3.9 10*3/uL (ref 1.5–6.5)
NEUT%: 58.4 % (ref 39.0–75.0)
PLATELETS: 328 10*3/uL (ref 140–400)
RBC: 4.23 10*6/uL (ref 4.20–5.82)
RDW: 15.8 % — ABNORMAL HIGH (ref 11.0–14.6)
WBC: 6.8 10*3/uL (ref 4.0–10.3)
lymph#: 1.9 10*3/uL (ref 0.9–3.3)

## 2014-04-10 MED ORDER — ONDANSETRON 16 MG/50ML IVPB (CHCC)
16.0000 mg | Freq: Once | INTRAVENOUS | Status: AC
  Administered 2014-04-10: 16 mg via INTRAVENOUS

## 2014-04-10 MED ORDER — SODIUM CHLORIDE 0.9 % IV SOLN
100.0000 mg/m2 | Freq: Once | INTRAVENOUS | Status: AC
  Administered 2014-04-10: 210 mg via INTRAVENOUS
  Filled 2014-04-10: qty 10.5

## 2014-04-10 MED ORDER — SODIUM CHLORIDE 0.9 % IJ SOLN
10.0000 mL | INTRAMUSCULAR | Status: DC | PRN
  Administered 2014-04-10: 10 mL via INTRAVENOUS
  Filled 2014-04-10: qty 10

## 2014-04-10 MED ORDER — DEXAMETHASONE SODIUM PHOSPHATE 20 MG/5ML IJ SOLN
INTRAMUSCULAR | Status: AC
  Filled 2014-04-10: qty 5

## 2014-04-10 MED ORDER — ONDANSETRON 16 MG/50ML IVPB (CHCC)
INTRAVENOUS | Status: AC
  Filled 2014-04-10: qty 16

## 2014-04-10 MED ORDER — DEXAMETHASONE SODIUM PHOSPHATE 20 MG/5ML IJ SOLN
20.0000 mg | Freq: Once | INTRAMUSCULAR | Status: AC
  Administered 2014-04-10: 20 mg via INTRAVENOUS

## 2014-04-10 MED ORDER — SODIUM CHLORIDE 0.9 % IV SOLN
750.0000 mg | Freq: Once | INTRAVENOUS | Status: AC
  Administered 2014-04-10: 750 mg via INTRAVENOUS
  Filled 2014-04-10: qty 75

## 2014-04-10 MED ORDER — SODIUM CHLORIDE 0.9 % IV SOLN
Freq: Once | INTRAVENOUS | Status: AC
  Administered 2014-04-10: 14:00:00 via INTRAVENOUS

## 2014-04-10 MED ORDER — HEPARIN SOD (PORK) LOCK FLUSH 100 UNIT/ML IV SOLN
500.0000 [IU] | Freq: Once | INTRAVENOUS | Status: AC | PRN
  Administered 2014-04-10: 500 [IU]
  Filled 2014-04-10: qty 5

## 2014-04-10 MED ORDER — SODIUM CHLORIDE 0.9 % IJ SOLN
10.0000 mL | INTRAMUSCULAR | Status: DC | PRN
  Administered 2014-04-10: 10 mL
  Filled 2014-04-10: qty 10

## 2014-04-10 NOTE — Patient Instructions (Signed)

## 2014-04-10 NOTE — Patient Instructions (Signed)
[facility] Discharge Instructions for Patients Receiving Chemotherapy  Today you received the following chemotherapy agents Etoposide and Carboplatin  To help prevent nausea and vomiting after your treatment, we encourage you to take your nausea medication Compazine 10 mg every 6 hours as needed.   If you develop nausea and vomiting that is not controlled by your nausea medication, call the clinic.   BELOW ARE SYMPTOMS THAT SHOULD BE REPORTED IMMEDIATELY:  *FEVER GREATER THAN 100.5 F  *CHILLS WITH OR WITHOUT FEVER  NAUSEA AND VOMITING THAT IS NOT CONTROLLED WITH YOUR NAUSEA MEDICATION  *UNUSUAL SHORTNESS OF BREATH  *UNUSUAL BRUISING OR BLEEDING  TENDERNESS IN MOUTH AND THROAT WITH OR WITHOUT PRESENCE OF ULCERS  *URINARY PROBLEMS  *BOWEL PROBLEMS  UNUSUAL RASH Items with * indicate a potential emergency and should be followed up as soon as possible.  Feel free to call the clinic you have any questions or concerns. The clinic phone number is [phone].

## 2014-04-11 ENCOUNTER — Ambulatory Visit (HOSPITAL_BASED_OUTPATIENT_CLINIC_OR_DEPARTMENT_OTHER): Payer: 59

## 2014-04-11 VITALS — BP 134/81 | HR 86 | Temp 98.4°F | Resp 18

## 2014-04-11 DIAGNOSIS — Z5111 Encounter for antineoplastic chemotherapy: Secondary | ICD-10-CM

## 2014-04-11 DIAGNOSIS — C3491 Malignant neoplasm of unspecified part of right bronchus or lung: Secondary | ICD-10-CM

## 2014-04-11 DIAGNOSIS — C3410 Malignant neoplasm of upper lobe, unspecified bronchus or lung: Secondary | ICD-10-CM

## 2014-04-11 MED ORDER — SODIUM CHLORIDE 0.9 % IV SOLN
100.0000 mg/m2 | Freq: Once | INTRAVENOUS | Status: AC
  Administered 2014-04-11: 210 mg via INTRAVENOUS
  Filled 2014-04-11: qty 10.5

## 2014-04-11 MED ORDER — PROCHLORPERAZINE MALEATE 10 MG PO TABS
10.0000 mg | ORAL_TABLET | Freq: Once | ORAL | Status: AC
  Administered 2014-04-11: 10 mg via ORAL

## 2014-04-11 MED ORDER — PROCHLORPERAZINE MALEATE 10 MG PO TABS
ORAL_TABLET | ORAL | Status: AC
  Filled 2014-04-11: qty 1

## 2014-04-11 MED ORDER — SODIUM CHLORIDE 0.9 % IJ SOLN
10.0000 mL | INTRAMUSCULAR | Status: DC | PRN
  Administered 2014-04-11: 10 mL
  Filled 2014-04-11: qty 10

## 2014-04-11 MED ORDER — SODIUM CHLORIDE 0.9 % IV SOLN
Freq: Once | INTRAVENOUS | Status: AC
  Administered 2014-04-11: 10:00:00 via INTRAVENOUS

## 2014-04-11 MED ORDER — HEPARIN SOD (PORK) LOCK FLUSH 100 UNIT/ML IV SOLN
500.0000 [IU] | Freq: Once | INTRAVENOUS | Status: AC | PRN
  Administered 2014-04-11: 500 [IU]
  Filled 2014-04-11: qty 5

## 2014-04-11 NOTE — Patient Instructions (Signed)
[facility] Discharge Instructions for Patients Receiving Chemotherapy  Today you received the following chemotherapy agents Etoposide.   To help prevent nausea and vomiting after your treatment, we encourage you to take your nausea medication as directed.    If you develop nausea and vomiting that is not controlled by your nausea medication, call the clinic.   BELOW ARE SYMPTOMS THAT SHOULD BE REPORTED IMMEDIATELY:  *FEVER GREATER THAN 100.5 F  *CHILLS WITH OR WITHOUT FEVER  NAUSEA AND VOMITING THAT IS NOT CONTROLLED WITH YOUR NAUSEA MEDICATION  *UNUSUAL SHORTNESS OF BREATH  *UNUSUAL BRUISING OR BLEEDING  TENDERNESS IN MOUTH AND THROAT WITH OR WITHOUT PRESENCE OF ULCERS  *URINARY PROBLEMS  *BOWEL PROBLEMS  UNUSUAL RASH Items with * indicate a potential emergency and should be followed up as soon as possible.  Feel free to call the clinic you have any questions or concerns. The clinic phone number is [phone].

## 2014-04-12 ENCOUNTER — Ambulatory Visit (HOSPITAL_BASED_OUTPATIENT_CLINIC_OR_DEPARTMENT_OTHER): Payer: 59

## 2014-04-12 ENCOUNTER — Encounter: Payer: Self-pay | Admitting: Internal Medicine

## 2014-04-12 VITALS — BP 122/81 | HR 94 | Temp 98.0°F | Resp 19 | Ht 70.0 in

## 2014-04-12 DIAGNOSIS — C3491 Malignant neoplasm of unspecified part of right bronchus or lung: Secondary | ICD-10-CM

## 2014-04-12 DIAGNOSIS — Z5111 Encounter for antineoplastic chemotherapy: Secondary | ICD-10-CM

## 2014-04-12 DIAGNOSIS — C3410 Malignant neoplasm of upper lobe, unspecified bronchus or lung: Secondary | ICD-10-CM

## 2014-04-12 MED ORDER — PROCHLORPERAZINE MALEATE 10 MG PO TABS
ORAL_TABLET | ORAL | Status: AC
  Filled 2014-04-12: qty 1

## 2014-04-12 MED ORDER — SODIUM CHLORIDE 0.9 % IV SOLN
100.0000 mg/m2 | Freq: Once | INTRAVENOUS | Status: AC
  Administered 2014-04-12: 210 mg via INTRAVENOUS
  Filled 2014-04-12: qty 10.5

## 2014-04-12 MED ORDER — PROCHLORPERAZINE MALEATE 10 MG PO TABS
10.0000 mg | ORAL_TABLET | Freq: Once | ORAL | Status: AC
  Administered 2014-04-12: 10 mg via ORAL

## 2014-04-12 MED ORDER — SODIUM CHLORIDE 0.9 % IJ SOLN
10.0000 mL | INTRAMUSCULAR | Status: DC | PRN
  Administered 2014-04-12: 10 mL
  Filled 2014-04-12: qty 10

## 2014-04-12 MED ORDER — HEPARIN SOD (PORK) LOCK FLUSH 100 UNIT/ML IV SOLN
500.0000 [IU] | Freq: Once | INTRAVENOUS | Status: AC | PRN
  Administered 2014-04-12: 500 [IU]
  Filled 2014-04-12: qty 5

## 2014-04-12 MED ORDER — SODIUM CHLORIDE 0.9 % IV SOLN
Freq: Once | INTRAVENOUS | Status: AC
  Administered 2014-04-12: 13:00:00 via INTRAVENOUS

## 2014-04-12 NOTE — Patient Instructions (Signed)
[facility] Discharge Instructions for Patients Receiving Chemotherapy  Today you received the following chemotherapy agents Etoposide (VP 16) To help prevent nausea and vomiting after your treatment, we encourage you to take your nausea medication as prescribed.If you develop nausea and vomiting that is not controlled by your nausea medication, call the clinic.   BELOW ARE SYMPTOMS THAT SHOULD BE REPORTED IMMEDIATELY:  *FEVER GREATER THAN 100.5 F  *CHILLS WITH OR WITHOUT FEVER  NAUSEA AND VOMITING THAT IS NOT CONTROLLED WITH YOUR NAUSEA MEDICATION  *UNUSUAL SHORTNESS OF BREATH  *UNUSUAL BRUISING OR BLEEDING  TENDERNESS IN MOUTH AND THROAT WITH OR WITHOUT PRESENCE OF ULCERS  *URINARY PROBLEMS  *BOWEL PROBLEMS  UNUSUAL RASH Items with * indicate a potential emergency and should be followed up as soon as possible.  Feel free to call the clinic you have any questions or concerns. The clinic phone number is [phone].

## 2014-04-12 NOTE — Progress Notes (Signed)
Patient complains of indigestion, patient states if he drinks ginger ale he thinks it will help. Patient instructed to notify nursing if the indigestion doesn't subside.

## 2014-04-13 ENCOUNTER — Ambulatory Visit (HOSPITAL_BASED_OUTPATIENT_CLINIC_OR_DEPARTMENT_OTHER): Payer: 59

## 2014-04-13 VITALS — BP 157/90 | HR 85 | Temp 98.6°F | Resp 18

## 2014-04-13 DIAGNOSIS — Z5189 Encounter for other specified aftercare: Secondary | ICD-10-CM

## 2014-04-13 DIAGNOSIS — C3491 Malignant neoplasm of unspecified part of right bronchus or lung: Secondary | ICD-10-CM

## 2014-04-13 DIAGNOSIS — C3410 Malignant neoplasm of upper lobe, unspecified bronchus or lung: Secondary | ICD-10-CM

## 2014-04-13 MED ORDER — PEGFILGRASTIM INJECTION 6 MG/0.6ML
6.0000 mg | Freq: Once | SUBCUTANEOUS | Status: AC
  Administered 2014-04-13: 6 mg via SUBCUTANEOUS
  Filled 2014-04-13: qty 0.6

## 2014-04-17 ENCOUNTER — Other Ambulatory Visit: Payer: 59

## 2014-04-18 ENCOUNTER — Ambulatory Visit (HOSPITAL_BASED_OUTPATIENT_CLINIC_OR_DEPARTMENT_OTHER): Payer: 59

## 2014-04-18 ENCOUNTER — Other Ambulatory Visit: Payer: Self-pay | Admitting: *Deleted

## 2014-04-18 ENCOUNTER — Telehealth: Payer: Self-pay | Admitting: *Deleted

## 2014-04-18 ENCOUNTER — Other Ambulatory Visit (HOSPITAL_BASED_OUTPATIENT_CLINIC_OR_DEPARTMENT_OTHER): Payer: 59

## 2014-04-18 ENCOUNTER — Ambulatory Visit (HOSPITAL_COMMUNITY)
Admission: RE | Admit: 2014-04-18 | Discharge: 2014-04-18 | Disposition: A | Discharge status: Home / Self Care | Payer: 59 | Source: Ambulatory Visit | Attending: Diagnostic Radiology | Admitting: Diagnostic Radiology

## 2014-04-18 ENCOUNTER — Encounter (HOSPITAL_COMMUNITY): Payer: Self-pay

## 2014-04-18 ENCOUNTER — Other Ambulatory Visit: Payer: 59

## 2014-04-18 DIAGNOSIS — I2699 Other pulmonary embolism without acute cor pulmonale: Secondary | ICD-10-CM

## 2014-04-18 DIAGNOSIS — J181 Lobar pneumonia, unspecified organism: Secondary | ICD-10-CM | POA: Diagnosis not present

## 2014-04-18 DIAGNOSIS — I289 Disease of pulmonary vessels, unspecified: Secondary | ICD-10-CM | POA: Diagnosis not present

## 2014-04-18 DIAGNOSIS — C3490 Malignant neoplasm of unspecified part of unspecified bronchus or lung: Secondary | ICD-10-CM | POA: Diagnosis present

## 2014-04-18 DIAGNOSIS — C3410 Malignant neoplasm of upper lobe, unspecified bronchus or lung: Secondary | ICD-10-CM

## 2014-04-18 DIAGNOSIS — R918 Other nonspecific abnormal finding of lung field: Secondary | ICD-10-CM | POA: Insufficient documentation

## 2014-04-18 DIAGNOSIS — Z95828 Presence of other vascular implants and grafts: Secondary | ICD-10-CM

## 2014-04-18 DIAGNOSIS — C3491 Malignant neoplasm of unspecified part of right bronchus or lung: Secondary | ICD-10-CM

## 2014-04-18 LAB — CBC WITH DIFFERENTIAL/PLATELET
BASO%: 0.5 % (ref 0.0–2.0)
Basophils Absolute: 0.1 10*3/uL (ref 0.0–0.1)
EOS%: 0.5 % (ref 0.0–7.0)
Eosinophils Absolute: 0.1 10*3/uL (ref 0.0–0.5)
HCT: 39.8 % (ref 38.4–49.9)
HGB: 13.1 g/dL (ref 13.0–17.1)
LYMPH#: 2.1 10*3/uL (ref 0.9–3.3)
LYMPH%: 17.4 % (ref 14.0–49.0)
MCH: 31.8 pg (ref 27.2–33.4)
MCHC: 33 g/dL (ref 32.0–36.0)
MCV: 96.4 fL (ref 79.3–98.0)
MONO#: 1 10*3/uL — AB (ref 0.1–0.9)
MONO%: 8.1 % (ref 0.0–14.0)
NEUT%: 73.5 % (ref 39.0–75.0)
NEUTROS ABS: 8.8 10*3/uL — AB (ref 1.5–6.5)
Platelets: 227 10*3/uL (ref 140–400)
RBC: 4.13 10*6/uL — AB (ref 4.20–5.82)
RDW: 15.1 % — ABNORMAL HIGH (ref 11.0–14.6)
WBC: 12 10*3/uL — AB (ref 4.0–10.3)

## 2014-04-18 LAB — COMPREHENSIVE METABOLIC PANEL (CC13)
ALK PHOS: 181 U/L — AB (ref 40–150)
ALT: 27 U/L (ref 0–55)
AST: 19 U/L (ref 5–34)
Albumin: 3.8 g/dL (ref 3.5–5.0)
Anion Gap: 8 mEq/L (ref 3–11)
BUN: 10.3 mg/dL (ref 7.0–26.0)
CO2: 23 mEq/L (ref 22–29)
Calcium: 9.3 mg/dL (ref 8.4–10.4)
Chloride: 104 mEq/L (ref 98–109)
Creatinine: 0.8 mg/dL (ref 0.7–1.3)
Glucose: 87 mg/dl (ref 70–140)
Potassium: 4.2 mEq/L (ref 3.5–5.1)
SODIUM: 134 meq/L — AB (ref 136–145)
Total Bilirubin: 0.22 mg/dL (ref 0.20–1.20)
Total Protein: 7.5 g/dL (ref 6.4–8.3)

## 2014-04-18 MED ORDER — HEPARIN SOD (PORK) LOCK FLUSH 100 UNIT/ML IV SOLN
500.0000 [IU] | Freq: Once | INTRAVENOUS | Status: AC
  Administered 2014-04-18: 500 [IU] via INTRAVENOUS
  Filled 2014-04-18: qty 5

## 2014-04-18 MED ORDER — IOHEXOL 300 MG/ML  SOLN
100.0000 mL | Freq: Once | INTRAMUSCULAR | Status: AC | PRN
  Administered 2014-04-18: 100 mL via INTRAVENOUS

## 2014-04-18 MED ORDER — ENOXAPARIN SODIUM 120 MG/0.8ML ~~LOC~~ SOLN
120.0000 mg | Freq: Once | SUBCUTANEOUS | Status: DC
Start: 2014-04-18 — End: 2014-04-18
  Administered 2014-04-18: 120 mg via SUBCUTANEOUS
  Filled 2014-04-18: qty 0.8

## 2014-04-18 MED ORDER — ENOXAPARIN SODIUM 120 MG/0.8ML ~~LOC~~ SOLN: 120.0000 mg | SUBCUTANEOUS | Status: DC

## 2014-04-18 MED ORDER — SODIUM CHLORIDE 0.9 % IJ SOLN
10.0000 mL | INTRAMUSCULAR | Status: DC | PRN
  Administered 2014-04-18: 10 mL via INTRAVENOUS
  Filled 2014-04-18: qty 10

## 2014-04-18 NOTE — Telephone Encounter (Signed)
Dr Vista Mink informed that pt has a positive PE.  Per Dr Vista Mink, pt needs lovenox 120mg  shot now and will be given teaching for home injections for the next 2 weeks.  Per Dr Vista Mink he should be able to transition to xarelto at that time.  Pt verbalized understanding.  He states he is currently asymptomatic.

## 2014-04-18 NOTE — Patient Instructions (Signed)
Enoxaparin, Home Use Enoxaparin (Lovenox) injection is a medication used to prevent clots from developing in your veins. Medications such as enoxaparin are called blood thinners or anticoagulants. If blood clots are untreated they could travel to your lungs. This is called a pulmonary embolus. A blood clot in your lungs can be fatal. Caregivers often use anticoagulants such as enoxaparin to prevent clots following surgery. It is also used along with aspirin when the heart is not getting enough blood. Continue the enoxaparin injections as directed by your caregiver. Your caregiver will use blood clotting test results to decide when you can safely stop using enoxaparin injections. If your caregiver prescribes any additional anticoagulant, you must take it exactly as directed. RISKS AND COMPLICATIONS  If you have received recent epidural anesthesia, spinal anesthesia, or a spinal tap while receiving anticoagulants, you are at risk for developing a blood clot in or around the spine. This condition could result in long-term or permanent paralysis.  Because anticoagulants thin your blood, severe bleeding may occur from any tissue or organ. Symptoms of the blood being too thin may include:  Bleeding from the nose or gums that does not stop quickly.  Unusual bruising or bruising easily.  Swelling or pain at an injection site.  A cut that does not stop bleeding within 10 minutes.  Continual nausea for more than 1 day or vomiting blood.  Coughing up blood.  Blood in the urine which may appear as pink, red, or brown urine.  Blood in bowel movements which may appear as red, dark or black stools.  Sudden weakness or numbness of the face, arm, or leg, especially on one side of the body.  Sudden confusion.  Trouble speaking (aphasia) or understanding.  Sudden trouble seeing in one or both eyes.  Sudden trouble walking.  Dizziness.  Loss of balance or coordination.  Severe pain, such as a  headache, joint pain, or back pain.  Fever.  Bruising around the injection sites may be expected.  Platelet drops, known as "thrombocytopenia," can occur with enoxaparin use. A condition called "heparin-induced thrombocytopenia" has been seen. If you have had this condition, you should tell your caregiver. Your caregiver may direct you to have blood tests to monitor this condition.  Do not use if you have allergies to the medication, heparin, or pork products.  Other side effects may include mild local reactions or irritation at the site of injection, pain, bruising, and redness of skin. HOME CARE INSTRUCTIONS You will be instructed by your caregiver how to give enoxaparin injections. 1. Before giving your medication you should make sure the injection is a clear and colorless or pale yellow solution. If your medication becomes discolored or has particles in the bottle, do not use and notify your caregiver. 2. When using the 30 and 40 mg pre-filled syringes, do not expel the air bubble from the syringe before the injection. This makes sure you use all the medication in the syringe. 3. The injections will be given subcutaneously. This means it is given into the fat over the belly (abdomen). It is given deep beneath the skin but not into the muscle. The shots should be injected around the abdominal wall. Change the sites of injection each time. The whole length of the needle should be introduced into a skin fold held between the thumb and forefinger; the skin fold should be held throughout the injection. Do not rub the injection site after completion of the injection. This increases bruising. Enoxaparin injection pre-filled syringes  and graduated pre-filled syringes are available with a system that shields the needle after injection. 4. Inject by pushing the plunger to the bottom of the syringe. 5. Remove the syringe from the injection site keeping your finger on the plunger rod. Be careful not to  stick yourself or others. 6. After injection and the syringe is empty, set off the safety system by firmly pushing the plunger rod. The protective sleeve will automatically cover the needle and you can hear a click. The click means your needle is safely covered. Do not try replacing the needle shield. 7. Get rid of the syringe in the nearest sharps container. 8. Keep your medication safely stored at room temperatures.  Due to the complications of anticoagulants, it is very important that you take your anticoagulant as directed by your caregiver. Anticoagulants need to be taken exactly as instructed. Be sure you understand all your anticoagulant instructions.  Changes in medicines, supplements, diet, and illness can affect your anticoagulation therapy. Be sure to inform your caregivers of any of these changes.  While on anticoagulants, you will need to have blood tests done routinely as directed by your caregivers.  Be careful not to cut yourself when using sharp objects.  Limit physical activities or sports that could result in a fall or cause injury.  It is extremely important that you tell all of your caregivers and dentist that you are taking an anticoagulant, especially if you are injured or plan to have any type of procedure or operation.  Follow up with your laboratory test and caregiver appointments as directed. It is very important to keep your appointments. Not keeping appointments could result in a chronic or permanent injury, pain, or disability. SEEK MEDICAL CARE IF:  You develop any rashes.  You have any worsening of the condition for which you are receiving anticoagulation therapy. SEEK IMMEDIATE MEDICAL CARE IF:  Bleeding from the nose or gums does not stop quickly.  You have unusual bruising or are bruising easily.  Swelling or pain occurs at an injection site.  A cut does not stop bleeding within 10 minutes.  You have continual nausea for more than 1 day or are  vomiting blood.  You are coughing up blood.  You have blood in the urine.  You have dark or black stools.  You have sudden weakness or numbness of the face, arm, or leg, especially on one side of the body.  You have sudden confusion.  You have trouble speaking (aphasia) or understanding.  You have sudden trouble seeing in one or both eyes.  You have sudden trouble walking.  You have dizziness.  You have a loss of balance or coordination.  You have severe pain, such as a headache, joint pain, or back pain.  You have a serious fall or head injury, even if you are not bleeding.  You have an oral temperature above 102 F (38.9 C), not controlled by medicine. ANY OF THESE SYMPTOMS MAY REPRESENT A SERIOUS PROBLEM THAT IS AN EMERGENCY. Do not wait to see if the symptoms will go away. Get medical help right away. Call your local emergency services (911 in [place] DO NOT drive yourself to the hospital. MAKE SURE YOU:  Understand these instructions.  Will watch your condition.  Will get help right away if you are not doing well or get worse. Document Released: 04/02/2004 Document Revised: 08/24/2011 Document Reviewed: 06/01/2005 [facility] Patient Information 2015 [place], [place]. This information is not intended to replace advice given to you  by your health care provider. Make sure you discuss any questions you have with your health care provider.   Enoxaparin injection What is this medicine? ENOXAPARIN (ee nox a PA rin) is used after knee, hip, or abdominal surgeries to prevent blood clotting. It is also used to treat existing blood clots in the lungs or in the veins. This medicine may be used for other purposes; ask your health care provider or pharmacist if you have questions. COMMON BRAND NAME(S): Lovenox What should I tell my health care provider before I take this medicine? They need to know if you have any of these conditions: -bleeding disorders, hemorrhage, or  hemophilia -infection of the heart or heart valves -kidney or liver disease -previous stroke -prosthetic heart valve -recent surgery or delivery of a baby -ulcer in the stomach or intestine, diverticulitis, or other bowel disease -an unusual or allergic reaction to enoxaparin, heparin, pork or pork products, other medicines, foods, dyes, or preservatives -pregnant or trying to get pregnant -breast-feeding How should I use this medicine? This medicine is for injection under the skin. It is usually given by a health-care professional. You or a family member may be trained on how to give the injections. If you are to give yourself injections, make sure you understand how to use the syringe, measure the dose if necessary, and give the injection. To avoid bruising, do not rub the site where this medicine has been injected. Do not take your medicine more often than directed. Do not stop taking except on the advice of your doctor or health care professional. Make sure you receive a puncture-resistant container to dispose of the needles and syringes once you have finished with them. Do not reuse these items. Return the container to your doctor or health care professional for proper disposal. Talk to your pediatrician regarding the use of this medicine in children. Special care may be needed. Overdosage: If you think you have taken too much of this medicine contact a poison control center or emergency room at once. NOTE: This medicine is only for you. Do not share this medicine with others. What if I miss a dose? If you miss a dose, take it as soon as you can. If it is almost time for your next dose, take only that dose. Do not take double or extra doses. What may interact with this medicine? -aspirin and aspirin-like medicines -certain medicines that treat or prevent blood clots -dipyridamole -NSAIDs, medicines for pain and inflammation, like ibuprofen or naproxen This list may not describe all  possible interactions. Give your health care provider a list of all the medicines, herbs, non-prescription drugs, or dietary supplements you use. Also tell them if you smoke, drink alcohol, or use illegal drugs. Some items may interact with your medicine. What should I watch for while using this medicine? Visit your doctor or health care professional for regular checks on your progress. Your condition will be monitored carefully while you are receiving this medicine. Notify your doctor or health care professional and seek emergency treatment if you develop breathing problems; changes in vision; chest pain; severe, sudden headache; pain, swelling, warmth in the leg; trouble speaking; sudden numbness or weakness of the face, arm, or leg. These can be signs that your condition has gotten worse. If you are going to have surgery, tell your doctor or health care professional that you are taking this medicine. Do not stop taking this medicine without first talking to your doctor. Be sure to refill your prescription  before you run out of medicine. Avoid sports and activities that might cause injury while you are using this medicine. Severe falls or injuries can cause unseen bleeding. Be careful when using sharp tools or knives. Consider using an Copy. Take special care brushing or flossing your teeth. Report any injuries, bruising, or red spots on the skin to your doctor or health care professional. What side effects may I notice from receiving this medicine? Side effects that you should report to your doctor or health care professional as soon as possible: -allergic reactions like skin rash, itching or hives, swelling of the face, lips, or tongue -feeling faint or lightheaded, falls -signs and symptoms of bleeding such as bloody or black, tarry stools; red or dark-brown urine; spitting up blood or brown material that looks like coffee grounds; red spots on the skin; unusual bruising or bleeding from  the eye, gums, or nose Side effects that usually do not require medical attention (report to your doctor or health care professional if they continue or are bothersome): -pain, redness, or irritation at site where injected This list may not describe all possible side effects. Call your doctor for medical advice about side effects. You may report side effects to FDA at 1-800-FDA-1088. Where should I keep my medicine? Keep out of the reach of children. Store at room temperature between 15 and 30 degrees C (59 and 86 degrees F). Do not freeze. If your injections have been specially prepared, you may need to store them in the refrigerator. Ask your pharmacist. Throw away any unused medicine after the expiration date. NOTE: This sheet is a summary. It may not cover all possible information. If you have questions about this medicine, talk to your doctor, pharmacist, or health care provider.  2015, [employer]. (2013-10-03 16:06:21)

## 2014-04-18 NOTE — Patient Instructions (Signed)

## 2014-04-18 NOTE — Progress Notes (Signed)
Pt educated on how to administer lovenox injections at home. Pt performed lovenox injection under RN supervision without difficulty. Pt able to state proper steps in administering lovenox injection and given paper instructions. Told to call if any questions or concerns arise.

## 2014-04-19 ENCOUNTER — Other Ambulatory Visit: Payer: 59

## 2014-04-19 DIAGNOSIS — B20 Human immunodeficiency virus [HIV] disease: Secondary | ICD-10-CM

## 2014-04-20 LAB — T-HELPER CELL (CD4) - (RCID CLINIC ONLY)
CD4 T CELL HELPER: 41 % (ref 33–55)
CD4 T Cell Abs: 900 /uL (ref 400–2700)

## 2014-04-23 LAB — HIV-1 RNA QUANT-NO REFLEX-BLD
HIV 1 RNA Quant: <20 copies/mL (ref <20)
HIV-1 RNA Quant, Log: <1.3 {Log} (ref <1.30)

## 2014-04-24 ENCOUNTER — Other Ambulatory Visit (HOSPITAL_BASED_OUTPATIENT_CLINIC_OR_DEPARTMENT_OTHER): Payer: 59

## 2014-04-24 DIAGNOSIS — C3410 Malignant neoplasm of upper lobe, unspecified bronchus or lung: Secondary | ICD-10-CM

## 2014-04-24 DIAGNOSIS — C3491 Malignant neoplasm of unspecified part of right bronchus or lung: Secondary | ICD-10-CM

## 2014-04-24 LAB — CBC WITH DIFFERENTIAL/PLATELET
BASO%: 0.9 % (ref 0.0–2.0)
BASOS ABS: 0.1 10*3/uL (ref 0.0–0.1)
EOS%: 1.1 % (ref 0.0–7.0)
Eosinophils Absolute: 0.1 10*3/uL (ref 0.0–0.5)
HEMATOCRIT: 40.8 % (ref 38.4–49.9)
HEMOGLOBIN: 13.5 g/dL (ref 13.0–17.1)
LYMPH#: 2 10*3/uL (ref 0.9–3.3)
LYMPH%: 25.8 % (ref 14.0–49.0)
MCH: 32.2 pg (ref 27.2–33.4)
MCHC: 33 g/dL (ref 32.0–36.0)
MCV: 97.3 fL (ref 79.3–98.0)
MONO#: 0.5 10*3/uL (ref 0.1–0.9)
MONO%: 6.2 % (ref 0.0–14.0)
NEUT%: 66 % (ref 39.0–75.0)
NEUTROS ABS: 5 10*3/uL (ref 1.5–6.5)
Platelets: 120 10*3/uL — ABNORMAL LOW (ref 140–400)
RBC: 4.19 10*6/uL — ABNORMAL LOW (ref 4.20–5.82)
RDW: 15.6 % — ABNORMAL HIGH (ref 11.0–14.6)
WBC: 7.6 10*3/uL (ref 4.0–10.3)

## 2014-04-24 LAB — COMPREHENSIVE METABOLIC PANEL (CC13)
ALBUMIN: 3.6 g/dL (ref 3.5–5.0)
ALT: 25 U/L (ref 0–55)
ANION GAP: 6 meq/L (ref 3–11)
AST: 23 U/L (ref 5–34)
Alkaline Phosphatase: 148 U/L (ref 40–150)
BUN: 9 mg/dL (ref 7.0–26.0)
CALCIUM: 9.1 mg/dL (ref 8.4–10.4)
CHLORIDE: 108 meq/L (ref 98–109)
CO2: 24 meq/L (ref 22–29)
Creatinine: 0.8 mg/dL (ref 0.7–1.3)
Glucose: 101 mg/dl (ref 70–140)
POTASSIUM: 4.2 meq/L (ref 3.5–5.1)
Sodium: 138 mEq/L (ref 136–145)
Total Bilirubin: 0.2 mg/dL (ref 0.20–1.20)
Total Protein: 7.2 g/dL (ref 6.4–8.3)

## 2014-05-01 ENCOUNTER — Telehealth: Payer: Self-pay | Admitting: Medical Oncology

## 2014-05-01 ENCOUNTER — Encounter: Payer: Self-pay | Admitting: Internal Medicine

## 2014-05-01 ENCOUNTER — Ambulatory Visit (HOSPITAL_BASED_OUTPATIENT_CLINIC_OR_DEPARTMENT_OTHER): Payer: 59 | Admitting: Internal Medicine

## 2014-05-01 ENCOUNTER — Other Ambulatory Visit (HOSPITAL_BASED_OUTPATIENT_CLINIC_OR_DEPARTMENT_OTHER): Payer: 59

## 2014-05-01 VITALS — BP 135/89 | HR 96 | Temp 97.9°F | Resp 18 | Ht 70.0 in | Wt 198.0 lb

## 2014-05-01 DIAGNOSIS — C3410 Malignant neoplasm of upper lobe, unspecified bronchus or lung: Secondary | ICD-10-CM

## 2014-05-01 DIAGNOSIS — C3491 Malignant neoplasm of unspecified part of right bronchus or lung: Secondary | ICD-10-CM

## 2014-05-01 DIAGNOSIS — R918 Other nonspecific abnormal finding of lung field: Secondary | ICD-10-CM

## 2014-05-01 DIAGNOSIS — I2699 Other pulmonary embolism without acute cor pulmonale: Secondary | ICD-10-CM

## 2014-05-01 LAB — COMPREHENSIVE METABOLIC PANEL (CC13)
ALT: 51 U/L (ref 0–55)
AST: 39 U/L — AB (ref 5–34)
Albumin: 3.8 g/dL (ref 3.5–5.0)
Alkaline Phosphatase: 137 U/L (ref 40–150)
Anion Gap: 8 mEq/L (ref 3–11)
BUN: 9.2 mg/dL (ref 7.0–26.0)
CALCIUM: 9.4 mg/dL (ref 8.4–10.4)
CHLORIDE: 106 meq/L (ref 98–109)
CO2: 24 mEq/L (ref 22–29)
Creatinine: 0.8 mg/dL (ref 0.7–1.3)
Glucose: 99 mg/dl (ref 70–140)
POTASSIUM: 4.6 meq/L (ref 3.5–5.1)
Sodium: 138 mEq/L (ref 136–145)
TOTAL PROTEIN: 7.5 g/dL (ref 6.4–8.3)
Total Bilirubin: 0.24 mg/dL (ref 0.20–1.20)

## 2014-05-01 LAB — CBC WITH DIFFERENTIAL/PLATELET
BASO%: 1 % (ref 0.0–2.0)
BASOS ABS: 0.1 10*3/uL (ref 0.0–0.1)
EOS%: 1.2 % (ref 0.0–7.0)
Eosinophils Absolute: 0.1 10*3/uL (ref 0.0–0.5)
HCT: 42.6 % (ref 38.4–49.9)
HEMOGLOBIN: 14 g/dL (ref 13.0–17.1)
LYMPH#: 2.1 10*3/uL (ref 0.9–3.3)
LYMPH%: 37.5 % (ref 14.0–49.0)
MCH: 32.2 pg (ref 27.2–33.4)
MCHC: 32.9 g/dL (ref 32.0–36.0)
MCV: 97.7 fL (ref 79.3–98.0)
MONO#: 0.6 10*3/uL (ref 0.1–0.9)
MONO%: 10.2 % (ref 0.0–14.0)
NEUT%: 50.1 % (ref 39.0–75.0)
NEUTROS ABS: 2.8 10*3/uL (ref 1.5–6.5)
Platelets: 281 10*3/uL (ref 140–400)
RBC: 4.36 10*6/uL (ref 4.20–5.82)
RDW: 15.7 % — AB (ref 11.0–14.6)
WBC: 5.6 10*3/uL (ref 4.0–10.3)

## 2014-05-01 MED ORDER — RIVAROXABAN (XARELTO) VTE STARTER PACK (15 & 20 MG): ORAL_TABLET | ORAL | Status: DC

## 2014-05-01 NOTE — Patient Instructions (Signed)
Smoking Cessation Quitting smoking is important to your health and has many advantages. However, it is not always easy to quit since nicotine is a very addictive drug. Oftentimes, people try 3 times or more before being able to quit. This document explains the best ways for you to prepare to quit smoking. Quitting takes hard work and a lot of effort, but you can do it. ADVANTAGES OF QUITTING SMOKING  You will live longer, feel better, and live better.  Your body will feel the impact of quitting smoking almost immediately.  Within 20 minutes, blood pressure decreases. Your pulse returns to its normal level.  After 8 hours, carbon monoxide levels in the blood return to normal. Your oxygen level increases.  After 24 hours, the chance of having a heart attack starts to decrease. Your breath, hair, and body stop smelling like smoke.  After 48 hours, damaged nerve endings begin to recover. Your sense of taste and smell improve.  After 72 hours, the body is virtually free of nicotine. Your bronchial tubes relax and breathing becomes easier.  After 2 to 12 weeks, lungs can hold more air. Exercise becomes easier and circulation improves.  The risk of having a heart attack, stroke, cancer, or lung disease is greatly reduced.  After 1 year, the risk of coronary heart disease is cut in half.  After 5 years, the risk of stroke falls to the same as a nonsmoker.  After 10 years, the risk of lung cancer is cut in half and the risk of other cancers decreases significantly.  After 15 years, the risk of coronary heart disease drops, usually to the level of a nonsmoker.  If you are pregnant, quitting smoking will improve your chances of having a healthy baby.  The people you live with, especially any children, will be healthier.  You will have extra money to spend on things other than cigarettes. QUESTIONS TO THINK ABOUT BEFORE ATTEMPTING TO QUIT You may want to talk about your answers with your  health care provider.  Why do you want to quit?  If you tried to quit in the past, what helped and what did not?  What will be the most difficult situations for you after you quit? How will you plan to handle them?  Who can help you through the tough times? Your family? Friends? A health care provider?  What pleasures do you get from smoking? What ways can you still get pleasure if you quit? Here are some questions to ask your health care provider:  How can you help me to be successful at quitting?  What medicine do you think would be best for me and how should I take it?  What should I do if I need more help?  What is smoking withdrawal like? How can I get information on withdrawal? GET READY  Set a quit date.  Change your environment by getting rid of all cigarettes, ashtrays, matches, and lighters in your home, car, or work. Do not let people smoke in your home.  Review your past attempts to quit. Think about what worked and what did not. GET SUPPORT AND ENCOURAGEMENT You have a better chance of being successful if you have help. You can get support in many ways.  Tell your family, friends, and coworkers that you are going to quit and need their support. Ask them not to smoke around you.  Get individual, group, or telephone counseling and support. Programs are available at local hospitals and health centers. Call   your local health department for information about programs in your area.  Spiritual beliefs and practices may help some smokers quit.  Download a "quit meter" on your computer to keep track of quit statistics, such as how long you have gone without smoking, cigarettes not smoked, and money saved.  Get a self-help book about quitting smoking and staying off tobacco. LEARN NEW SKILLS AND BEHAVIORS  Distract yourself from urges to smoke. Talk to someone, go for a walk, or occupy your time with a task.  Change your normal routine. Take a different route to work.  Drink tea instead of coffee. Eat breakfast in a different place.  Reduce your stress. Take a hot bath, exercise, or read a book.  Plan something enjoyable to do every day. Reward yourself for not smoking.  Explore interactive web-based programs that specialize in helping you quit. GET MEDICINE AND USE IT CORRECTLY Medicines can help you stop smoking and decrease the urge to smoke. Combining medicine with the above behavioral methods and support can greatly increase your chances of successfully quitting smoking.  Nicotine replacement therapy helps deliver nicotine to your body without the negative effects and risks of smoking. Nicotine replacement therapy includes nicotine gum, lozenges, inhalers, nasal sprays, and skin patches. Some may be available over-the-counter and others require a prescription.  Antidepressant medicine helps people abstain from smoking, but how this works is unknown. This medicine is available by prescription.  Nicotinic receptor partial agonist medicine simulates the effect of nicotine in your brain. This medicine is available by prescription. Ask your health care provider for advice about which medicines to use and how to use them based on your health history. Your health care provider will tell you what side effects to look out for if you choose to be on a medicine or therapy. Carefully read the information on the package. Do not use any other product containing nicotine while using a nicotine replacement product.  RELAPSE OR DIFFICULT SITUATIONS Most relapses occur within the first 3 months after quitting. Do not be discouraged if you start smoking again. Remember, most people try several times before finally quitting. You may have symptoms of withdrawal because your body is used to nicotine. You may crave cigarettes, be irritable, feel very hungry, cough often, get headaches, or have difficulty concentrating. The withdrawal symptoms are only temporary. They are strongest  when you first quit, but they will go away within 10-14 days. To reduce the chances of relapse, try to:  Avoid drinking alcohol. Drinking lowers your chances of successfully quitting.  Reduce the amount of caffeine you consume. Once you quit smoking, the amount of caffeine in your body increases and can give you symptoms, such as a rapid heartbeat, sweating, and anxiety.  Avoid smokers because they can make you want to smoke.  Do not let weight gain distract you. Many smokers will gain weight when they quit, usually less than 10 pounds. Eat a healthy diet and stay active. You can always lose the weight gained after you quit.  Find ways to improve your mood other than smoking. FOR MORE INFORMATION  www.smokefree.gov  Document Released: 05/26/2001 Document Revised: 10/16/2013 Document Reviewed: 09/10/2011 [employer] Patient Information 2015 [employer]. This information is not intended to replace advice given to you by your health care provider. Make sure you discuss any questions you have with your health care provider.  

## 2014-05-01 NOTE — Telephone Encounter (Signed)
Called in [place] to local pharmacy.

## 2014-05-01 NOTE — Progress Notes (Signed)
[place] [phone]   [phone]  OFFICE PROGRESS NOTE  Irven Shelling, MD [employer], [address] [place] [address]  DIAGNOSIS:  1) Small cell carcinoma of lung, Extensive stage diagnosed in June 2015. 2) incidental diagnosis of pulmonary embolism on CT scan of the chest performed on 04/18/2014. Primary site: Lung (Right)  Staging method: AJCC 7th Edition  Clinical: Stage IV (T1a, N1, M1b) signed by Curt Bears, MD on 12/07/2013 3:19 PM  Summary: Stage IV (T1a, N1, M1b)   PRIOR THERAPY:  1)  Systemic chemotherapy with carboplatin for AUC of 5 given on day 1, etoposide 100 mg/m2 given on days 1, 2 an3 with neulast support on day 4. Status post 6 cycles.  2) Lovenox 120 mg subcutaneously started 04/18/2014  CURRENT THERAPY:  1) observation for the small cell lung cancer. 2) Xarelto 15 mg by mouth twice a day for 3 weeks and this will be followed by 20 mg by mouth daily. First dose of Systane treatment on 05/03/2014.  DISEASE STAGE:  Small cell carcinoma of lung, Extensive stage  Primary site: Lung (Right)  Staging method: AJCC 7th Edition  Clinical: Stage IV (T1a, N1, M1b) signed by Curt Bears, MD on 12/07/2013 3:19 PM  Summary: Stage IV (T1a, N1, M1b)  CHEMOTHERAPY INTENT: pallative  CURRENT # OF CHEMOTHERAPY CYCLES: 6 CURRENT ANTIEMETICS: Zofran, dexamethasone and compazine  CURRENT SMOKING STATUS: Former smoker, quit 12/18/2013  ORAL CHEMOTHERAPY AND CONSENT: n/a  CURRENT BISPHOSPHONATES USE: none  PAIN MANAGEMENT: Lortab 5/325 mg  NARCOTICS INDUCED CONSTIPATION: none  LIVING WILL AND CODE STATUS: ?   INTERVAL HISTORY: Jaime Morris 53 y.o. male returns to the clinic today for followup visit accompanied by a friend. The patient tolerated the last cycle of his systemic chemotherapy with carboplatin and etoposide fairly well. He denied having any significant weight loss or night sweats. He has no chest  pain, shortness of breath, cough or hemoptysis. His no significant weight loss or night sweats. He was recently found on CT scan of the chest, abdomen and pelvis performed on 04/18/2014 to have abnormal pulmonary arterial filling defect within the posterior basal branch of the right lower lobe pulmonary artery consistent with pulmonary embolism. The patient was started on Lovenox 120 mg subcutaneously. He is tolerating it well but would like to consider other oral options. He is here today for evaluation and discussion of his scan results and further recommendation regarding his lung cancer.  MEDICAL HISTORY: Past Medical History  Diagnosis Date  . HIV infection     DR. [name]  . Hyperlipidemia   . Insomnia   . Lung nodule, multiple 10/24/13    CT CHEST W/O...DR. [name]  . Gynecomastia, male 10/16/13    MILD RIGHT SIDED PER RADIOLOGY STUDY  . COPD (chronic obstructive pulmonary disease)     PFT'S 10/26/13 @ [place]  . History of depression   . History of pneumonia   . Small cell lung cancer   . Collapsed lung     d/t biopsy, right    ALLERGIES:  is allergic to lactose intolerance (gi).  MEDICATIONS:  Current Outpatient Prescriptions  Medication Sig Dispense Refill  . albuterol (PROAIR HFA) 108 (90 BASE) MCG/ACT inhaler Inhale 2 puffs into the lungs every 4 (four) hours as needed for wheezing or shortness of breath.     . doxylamine, Sleep, (UNISOM) 25 MG tablet Take 50 mg by mouth at bedtime as needed  for sleep.     Marland Kitchen efavirenz-emtricitabine-tenofovir (ATRIPLA) 600-200-300 MG per tablet Take 1 tablet by mouth daily. 90 tablet 3  . enoxaparin (LOVENOX) 120 MG/0.8ML injection Inject 0.8 mLs (120 mg total) into the skin daily. 14 Syringe 0  . fluticasone (FLONASE) 50 MCG/ACT nasal spray Place 2 sprays into both nostrils daily as needed for allergies.     Marland Kitchen HYDROcodone-acetaminophen (LORTAB) 5-325 MG per tablet Take 1-2 tablets by mouth every 6 (six) hours as needed for moderate  pain. 30 tablet 0  . levofloxacin (LEVAQUIN) 500 MG tablet Take 1 tablet by mouth daily for 5 days. 5 tablet 0  . lidocaine-prilocaine (EMLA) cream Apply 1 application topically as needed. Apply to port 1 hour before chemo appointment. 30 g 0  . Melatonin 10 MG TABS Take 10 mg by mouth at bedtime as needed (sleep).    . mirtazapine (REMERON) 15 MG tablet Take 1 tablet (15 mg total) by mouth at bedtime. 90 tablet 1  . prochlorperazine (COMPAZINE) 10 MG tablet Take 1 tablet (10 mg total) by mouth every 6 (six) hours as needed for nausea or vomiting. 30 tablet 1  . varenicline (CHANTIX) 1 MG tablet Take 1 mg by mouth 2 (two) times daily.      No current facility-administered medications for this visit.   Facility-Administered Medications Ordered in Other Visits  Medication Dose Route Frequency Provider Last Rate Last Dose  . sodium chloride 0.9 % injection 10 mL  10 mL Intracatheter PRN Curt Bears, MD   10 mL at 01/30/14 1451    SURGICAL HISTORY:  Past Surgical History  Procedure Laterality Date  . Foot surgery      BUNIONECTOMY  . Back surgery      L 4-5 FUSION WITH SCREW  . Colonoscopy  UTD  . Portacath placement N/A 12/08/2013    Procedure: INSERTION PORT-A-CATH;  Surgeon: Grace Isaac, MD;  Location: [facility];  Service: Thoracic;  Laterality: N/A;    REVIEW OF SYSTEMS:  Constitutional: positive for fatigue Eyes: negative Ears, nose, mouth, throat, and face: negative Respiratory: negative Cardiovascular: negative Gastrointestinal: negative Genitourinary:negative Integument/breast: negative Hematologic/lymphatic: negative Musculoskeletal:negative Neurological: negative Behavioral/Psych: negative Endocrine: negative Allergic/Immunologic: negative   PHYSICAL EXAMINATION: General appearance: alert, cooperative, fatigued and no distress Head: Normocephalic, without obvious abnormality, atraumatic Neck: no adenopathy, no JVD, supple, symmetrical, trachea midline and  thyroid not enlarged, symmetric, no tenderness/mass/nodules Lymph nodes: Cervical, supraclavicular, and axillary nodes normal. Resp: clear to auscultation bilaterally Back: symmetric, no curvature. ROM normal. No CVA tenderness. Cardio: regular rate and rhythm, S1, S2 normal, no murmur, click, rub or gallop GI: soft, non-tender; bowel sounds normal; no masses,  no organomegaly Extremities: extremities normal, atraumatic, no cyanosis or edema Neurologic: Alert and oriented X 3, normal strength and tone. Normal symmetric reflexes. Normal coordination and gait  ECOG PERFORMANCE STATUS: 1 - Symptomatic but completely ambulatory  Blood pressure 135/89, pulse 96, temperature 97.9 F (36.6 C), temperature source Oral, resp. rate 18, height 5\' 10"  (1.778 m), weight 198 lb (89.812 kg), SpO2 97 %.  LABORATORY DATA: Lab Results  Component Value Date   WBC 5.6 05/01/2014   HGB 14.0 05/01/2014   HCT 42.6 05/01/2014   MCV 97.7 05/01/2014   PLT 281 05/01/2014      Chemistry      Component Value Date/Time   NA 138 04/24/2014 1141   NA 134* 01/22/2014 0955   K 4.2 04/24/2014 1141   K 4.2 01/22/2014 0955   CL 102  01/22/2014 0955   CO2 24 04/24/2014 1141   CO2 23 01/22/2014 0955   BUN 9.0 04/24/2014 1141   BUN 12 01/22/2014 0955   CREATININE 0.8 04/24/2014 1141   CREATININE 0.97 01/22/2014 0955   CREATININE 0.79 10/23/2013 1112      Component Value Date/Time   CALCIUM 9.1 04/24/2014 1141   CALCIUM 9.6 01/22/2014 0955   ALKPHOS 148 04/24/2014 1141   ALKPHOS 138* 01/22/2014 0955   AST 23 04/24/2014 1141   AST 23 01/22/2014 0955   ALT 25 04/24/2014 1141   ALT 28 01/22/2014 0955   BILITOT 0.20 04/24/2014 1141   BILITOT 0.5 01/22/2014 0955       RADIOGRAPHIC STUDIES: Ct Chest W Contrast  04/18/2014   CLINICAL DATA:  Squamous cell carcinoma of the lung restaging  EXAM: CT CHEST, ABDOMEN, AND PELVIS WITH CONTRAST  TECHNIQUE: Multidetector CT imaging of the chest, abdomen and pelvis was  performed following the standard protocol during bolus administration of intravenous contrast.  CONTRAST:  172mL OMNIPAQUE IOHEXOL 300 MG/ML  SOLN  COMPARISON:  02/16/2014  FINDINGS: CT CHEST FINDINGS  Mediastinum: The heart size is normal. There is no pericardial effusion. The trachea appears patent and is midline. Normal appearance of the esophagus. No enlarged mediastinal or hilar lymph nodes. There is a filling defect within the posterior and basilar segment of right lower lobe pulmonary artery consistent with embolus.  Lungs/Pleura: Moderate changes of centrilobular and paraseptal emphysema are noted. No pleural effusion identified. There is a wedge-shaped area of consolidation/atelectasis involving the right middle lobe which is new from previous exam. This measures 2.6 x 3.1 cm, image number 41/series 2. The small nodules within the right middle lobe and right lower lobe are stable when compared with the previous exam measuring up to 4 mm, image number 39/series 4. Stable 3 mm left upper lobe nodule, image 33/series 4.  Musculoskeletal: Review of the visualized osseous structures is significant for mild spondylosis within the thoracic spine. There is no aggressive lytic or sclerotic bone lesions identified.  CT ABDOMEN AND PELVIS FINDINGS  Hepatobiliary: No focal liver abnormality identified. The gallbladder appears normal. No biliary dilatation.  Pancreas: Normal appearance of the pancreas.  Spleen: The spleen is unremarkable.  Adrenals/Urinary Tract: The adrenal glands are both normal. Normal appearance of both kidneys.  Stomach/Bowel: The stomach is normal. The small bowel loops have a normal course and caliber without obstruction. The appendix is visualized and appears normal. Normal appearance of the colon.  Vascular/Lymphatic: There is calcified atherosclerotic disease involving the abdominal aorta. No aneurysm. There is no retroperitoneal adenopathy identified. No pelvic or inguinal adenopathy  identified.  Reproductive: The prostate gland appears enlarged. Symmetric appearance of the seminal vesicles.  Other: There is no free fluid or fluid collections within the abdomen or pelvis.  Musculoskeletal: Review of the visualized osseous structures is negative for aggressive lytic or sclerotic bone lesion. Advanced lumbar degenerative disc disease is noted. Significant anterolisthesis of L5 on S1 is again noted. The patient is status post hardware fixation at the L4 through S1 level.  IMPRESSION: 1. New masslike consolidation in the right middle lobe is nonspecific. Short-term followup imaging is recommended to ensure resolution. If this does not resolve then consider a PET-CT to assess for malignant range FDG uptake within this area suggestive of recurrent tumor. 2. Multiple small pulmonary nodules are identified within both lungs and appear unchanged from the previous exam. 3. Abnormal pulmonary arterial filling defect within posterior basal branch of the  right lower lobe pulmonary artery consistent with pulmonary embolus. 4. No evidence for metastatic disease to the abdomen or pelvis.   Electronically Signed   By: Kerby Moors M.D.   On: 04/18/2014 16:20   Ct Abdomen Pelvis W Contrast  04/18/2014   CLINICAL DATA:  Squamous cell carcinoma of the lung restaging  EXAM: CT CHEST, ABDOMEN, AND PELVIS WITH CONTRAST  TECHNIQUE: Multidetector CT imaging of the chest, abdomen and pelvis was performed following the standard protocol during bolus administration of intravenous contrast.  CONTRAST:  135mL OMNIPAQUE IOHEXOL 300 MG/ML  SOLN  COMPARISON:  02/16/2014  FINDINGS: CT CHEST FINDINGS  Mediastinum: The heart size is normal. There is no pericardial effusion. The trachea appears patent and is midline. Normal appearance of the esophagus. No enlarged mediastinal or hilar lymph nodes. There is a filling defect within the posterior and basilar segment of right lower lobe pulmonary artery consistent with embolus.   Lungs/Pleura: Moderate changes of centrilobular and paraseptal emphysema are noted. No pleural effusion identified. There is a wedge-shaped area of consolidation/atelectasis involving the right middle lobe which is new from previous exam. This measures 2.6 x 3.1 cm, image number 41/series 2. The small nodules within the right middle lobe and right lower lobe are stable when compared with the previous exam measuring up to 4 mm, image number 39/series 4. Stable 3 mm left upper lobe nodule, image 33/series 4.  Musculoskeletal: Review of the visualized osseous structures is significant for mild spondylosis within the thoracic spine. There is no aggressive lytic or sclerotic bone lesions identified.  CT ABDOMEN AND PELVIS FINDINGS  Hepatobiliary: No focal liver abnormality identified. The gallbladder appears normal. No biliary dilatation.  Pancreas: Normal appearance of the pancreas.  Spleen: The spleen is unremarkable.  Adrenals/Urinary Tract: The adrenal glands are both normal. Normal appearance of both kidneys.  Stomach/Bowel: The stomach is normal. The small bowel loops have a normal course and caliber without obstruction. The appendix is visualized and appears normal. Normal appearance of the colon.  Vascular/Lymphatic: There is calcified atherosclerotic disease involving the abdominal aorta. No aneurysm. There is no retroperitoneal adenopathy identified. No pelvic or inguinal adenopathy identified.  Reproductive: The prostate gland appears enlarged. Symmetric appearance of the seminal vesicles.  Other: There is no free fluid or fluid collections within the abdomen or pelvis.  Musculoskeletal: Review of the visualized osseous structures is negative for aggressive lytic or sclerotic bone lesion. Advanced lumbar degenerative disc disease is noted. Significant anterolisthesis of L5 on S1 is again noted. The patient is status post hardware fixation at the L4 through S1 level.  IMPRESSION: 1. New masslike consolidation  in the right middle lobe is nonspecific. Short-term followup imaging is recommended to ensure resolution. If this does not resolve then consider a PET-CT to assess for malignant range FDG uptake within this area suggestive of recurrent tumor. 2. Multiple small pulmonary nodules are identified within both lungs and appear unchanged from the previous exam. 3. Abnormal pulmonary arterial filling defect within posterior basal branch of the right lower lobe pulmonary artery consistent with pulmonary embolus. 4. No evidence for metastatic disease to the abdomen or pelvis.   Electronically Signed   By: Kerby Moors M.D.   On: 04/18/2014 16:20   ASSESSMENT AND PLAN:  This is a very pleasant 53 years old [place] American male recently diagnosed with:  1) Extensive stage small cell lung cancer: He is status post 6 cycles of systemic chemotherapy with carboplatin and etoposide. The recent CT scan  of the chest, abdomen and pelvis showed significant improvement in his disease with no evidence for disease progression. He has a new masslike consolidation in the right middle lobe that is nonspecific and most likely secondary to atelectasis from the recent pneumonia. I discussed the scan results with the patient and his friend. I recommended for him to continue on observation with repeat CT scan of the chest, abdomen and pelvis in 2 months for reevaluation of his disease. I also recommended for the patient to see Dr. Sondra Come for consideration of prophylactic cranial irradiation. He was advised to call immediately if he has any concerning symptoms in the interval.  2) right lower lobe pulmonary embolus: the patient was started on treatment with Lovenox 120 mg subcutaneously since 04/18/2014. He does not want to continue on subcutaneous injection and I will switch him to Xarelto 15 mg by mouth twice a day initiated for 3 weeks followed by 20 mg by mouth daily. He is expected to continue on this treatment for a period of 6-12  months.  He was advised to call immediately if he has any concerning symptoms in the interval. The patient voices understanding of current disease status and treatment options and is in agreement with the current care plan.  All questions were answered. The patient knows to call the clinic with any problems, questions or concerns. We can certainly see the patient much sooner if necessary.  Disclaimer: This note was dictated with voice recognition software. Similar sounding words can inadvertently be transcribed and may not be corrected upon review.

## 2014-05-02 ENCOUNTER — Telehealth: Payer: Self-pay | Admitting: Internal Medicine

## 2014-05-02 NOTE — Telephone Encounter (Signed)
lvm fo rpt regarding to Jan appts...mailed pt appt sched/avs and letter

## 2014-05-03 ENCOUNTER — Ambulatory Visit (INDEPENDENT_AMBULATORY_CARE_PROVIDER_SITE_OTHER): Payer: 59 | Admitting: Internal Medicine

## 2014-05-03 ENCOUNTER — Encounter: Payer: Self-pay | Admitting: Internal Medicine

## 2014-05-03 VITALS — BP 133/89 | HR 91 | Temp 98.4°F | Ht 70.0 in | Wt 197.2 lb

## 2014-05-03 DIAGNOSIS — B20 Human immunodeficiency virus [HIV] disease: Secondary | ICD-10-CM

## 2014-05-03 NOTE — Assessment & Plan Note (Signed)
Doing great.  CD4 good despite recent chemo.   RTC 4 months.

## 2014-05-03 NOTE — Progress Notes (Signed)
   Subjective:    Patient ID: Jaime Morris, male    DOB: 03/17/1961, 53 y.o.   MRN: [id number]  HPI Jaime Morris comes in for 042.  He continues on Atripla and CD 4 of 900 with a continued undetectable viral load.  He has had no issues related to HIV.  He has completed chemotherapyfor his lung cancer and is scheduled to start cranial radiation in December.  Got flu shot.  Will defer zostavax until February.     Review of Systems  Constitutional: Negative for fever, chills, fatigue and unexpected weight change.  Gastrointestinal: Negative for nausea and diarrhea.  Skin: Negative for rash.  Neurological: Negative for dizziness.  Hematological: Negative for adenopathy.       Objective:   Physical Exam  Constitutional: He appears well-developed and well-nourished. No distress.  HENT:  Mouth/Throat: No oropharyngeal exudate.  Eyes: No scleral icterus.  Cardiovascular: Normal rate, regular rhythm and normal heart sounds.   No murmur heard. Pulmonary/Chest: Effort normal and breath sounds normal. No respiratory distress.  Lymphadenopathy:    He has no cervical adenopathy.  Skin: No rash noted.          Assessment & Plan:

## 2014-05-09 NOTE — Progress Notes (Addendum)
Histology and Location of Primary Cancer: Right upper lung small cell lung cancer extensive stage diagnosed in June 2015 for a consult regarding prophylactic cranial irradiation   Past/Anticipated chemotherapy by medical oncology, if any: Systemic chemotherapy with carboplatin for AUC of 5 given on day 1, etoposide 100 mg/m2 given on days 1, 2 an3 with neulast support on day 4. Status post 6 cycles. First dose of Systane treatment on 05/03/2014.  Patient is currently on Xarelto for a pulmonary embolism in his right lung on CT scan of the chest performed on 04/18/2014.  Pain on a scale of 0-10 is: 0  SAFETY ISSUES:  Prior radiation? no  Pacemaker/ICD? no  Possible current pregnancy? no  Is the patient on methotrexate? no  Additional Complaints / other details:  Patient is HIV positive. Patient had last chemotherapy treatment on 04/12/14.  Patient reports having a dry, frequent cough and shortness of breath occasionally with activity.  Patient reports having nausea and one episode of vomiting yesterday.

## 2014-05-14 ENCOUNTER — Ambulatory Visit (INDEPENDENT_AMBULATORY_CARE_PROVIDER_SITE_OTHER): Payer: 59 | Admitting: Pulmonary Disease

## 2014-05-14 ENCOUNTER — Encounter: Payer: Self-pay | Admitting: Pulmonary Disease

## 2014-05-14 VITALS — BP 146/96 | HR 97 | Ht 70.0 in | Wt 202.2 lb

## 2014-05-14 DIAGNOSIS — C3491 Malignant neoplasm of unspecified part of right bronchus or lung: Secondary | ICD-10-CM

## 2014-05-14 DIAGNOSIS — J439 Emphysema, unspecified: Secondary | ICD-10-CM

## 2014-05-14 DIAGNOSIS — Z86711 Personal history of pulmonary embolism: Secondary | ICD-10-CM | POA: Insufficient documentation

## 2014-05-14 DIAGNOSIS — I2699 Other pulmonary embolism without acute cor pulmonale: Secondary | ICD-10-CM

## 2014-05-14 NOTE — Progress Notes (Signed)
Subjective:    Patient ID: Jaime Morris, male    DOB: 1961/02/22, 53 y.o.   MRN: [id number]  HPI  53 year old smoker , HIV positive and small cell lung cancer presents for evaluation of dyspnea. He smoked about a pack per day for 40 years-about 40 pack years, until he quit in 03/2014. He was diagnosed with HIV in 1995, last CD4 count is more than 900, viral load undetectable. Screening CT chest in 10/2013 showed an 1914 mm lobulated pleural-based nodule in the right upper lobe with other scattered subcentimeter nodules, on biopsy this turned out to be small cell lung cancer. He underwent chemotherapy with carboplatin and etoposide 6 cycles, and prophylactic brain radiation is planned. CT angiogram on 04/18/14 showed small filling defect in the segmental branch of the right lower lobe pulmonary artery. There was a wedge-shaped area of consolidation in the right middle lobe-new. Other small nodules were stable. He was treated with Lovenox for pulmonary embolism, and then placed on rivaroxaban. PFTs 10/2013 showed FEV1 of 98%, TLC 110%, DLCO 65%  Reports dyspnea on exertion class II for the past 3-4 months. He denies wheezing or frequent chest colds. He denies cough or sputum production. 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   Past Medical History  Diagnosis Date  . HIV infection     DR. [name]  . Hyperlipidemia   . Insomnia   . Lung nodule, multiple 10/24/13    CT CHEST W/O...DR. [name]  . Gynecomastia, male 10/16/13    MILD RIGHT SIDED PER RADIOLOGY STUDY  . COPD (chronic obstructive pulmonary disease)     PFT'S 10/26/13 @ [place]  . History of depression   . History of pneumonia   . Small cell lung cancer   . Collapsed lung     d/t biopsy, right    Past Surgical History  Procedure Laterality Date  . Foot surgery      BUNIONECTOMY  . Back surgery      L 4-5 FUSION WITH SCREW  . Colonoscopy  UTD  . Portacath placement N/A 12/08/2013   Procedure: INSERTION PORT-A-CATH;  Surgeon: Grace Isaac, MD;  Location: [facility];  Service: Thoracic;  Laterality: N/A;    Allergies  Allergen Reactions  . Lactose Intolerance (Gi)        Review of Systems  Constitutional: Negative for fever and unexpected weight change.  HENT: Negative for congestion, dental problem, ear pain, nosebleeds, postnasal drip, rhinorrhea, sinus pressure, sneezing, sore throat and trouble swallowing.   Eyes: Negative for redness and itching.  Respiratory: Positive for cough and shortness of breath. Negative for chest tightness and wheezing.   Cardiovascular: Negative for palpitations and leg swelling.  Gastrointestinal: Negative for nausea and vomiting.  Genitourinary: Negative for dysuria.  Musculoskeletal: Negative for joint swelling.  Skin: Negative for rash.  Neurological: Negative for headaches.  Hematological: Does not bruise/bleed easily.  Psychiatric/Behavioral: Negative for dysphoric mood. The patient is not nervous/anxious.        Objective:   Physical Exam  Gen. Pleasant, well-nourished, in no distress, normal affect ENT - no lesions, no post nasal drip Neck: No JVD, no thyromegaly, no carotid bruits Lungs: no use of accessory muscles, no dullness to percussion, clear without rales or rhonchi  Cardiovascular: Rhythm regular, heart sounds  normal, no murmurs or gallops, no peripheral edema Abdomen: soft and non-tender, no hepatosplenomegaly, BS normal. Musculoskeletal: No deformities, no cyanosis or clubbing Neuro:  alert, non focal  Assessment & Plan:

## 2014-05-14 NOTE — Assessment & Plan Note (Signed)
Stay on xarelto - blood clot can take upto 3 months to resolve Plan for at least 6-12 months , prob lifelong

## 2014-05-14 NOTE — Assessment & Plan Note (Signed)
The original pleural based nodule has resolved-other small nodules are stable. The area of consolidation in the right middle lobe is new, but does not have a typical appearance of malignancy, needs short-term follow-up scan in 3-4 months.

## 2014-05-14 NOTE — Assessment & Plan Note (Signed)
Your short ness of breath may be due to emphysema & embolism Trial of spiriva respimat 2 puffs once daily - sample - if this does not work, we can try symbicort Pulmonary rehab referral

## 2014-05-14 NOTE — Patient Instructions (Signed)
Your short ness of breath may be due to emphysema & embolism Trial of spiriva respimat 2 puffs once daily - sample - if this does not work, we can try symbicort Pulmonary rehab referral Stay on xarelto - blood clot can take upto 3 months to resolve

## 2014-05-16 ENCOUNTER — Ambulatory Visit
Admission: RE | Admit: 2014-05-16 | Discharge: 2014-05-16 | Disposition: A | Discharge status: Home / Self Care | Payer: 59 | Source: Ambulatory Visit | Attending: Radiation Oncology | Admitting: Radiation Oncology

## 2014-05-16 ENCOUNTER — Encounter: Payer: Self-pay | Admitting: Radiation Oncology

## 2014-05-16 VITALS — BP 145/93 | HR 94 | Temp 98.7°F | Resp 20 | Ht 70.0 in | Wt 196.8 lb

## 2014-05-16 DIAGNOSIS — G4700 Insomnia, unspecified: Secondary | ICD-10-CM | POA: Insufficient documentation

## 2014-05-16 DIAGNOSIS — Z510 Encounter for antineoplastic radiation therapy: Secondary | ICD-10-CM | POA: Diagnosis not present

## 2014-05-16 DIAGNOSIS — C3491 Malignant neoplasm of unspecified part of right bronchus or lung: Secondary | ICD-10-CM

## 2014-05-16 DIAGNOSIS — Z87891 Personal history of nicotine dependence: Secondary | ICD-10-CM | POA: Insufficient documentation

## 2014-05-16 DIAGNOSIS — J449 Chronic obstructive pulmonary disease, unspecified: Secondary | ICD-10-CM | POA: Diagnosis not present

## 2014-05-16 DIAGNOSIS — C3490 Malignant neoplasm of unspecified part of unspecified bronchus or lung: Secondary | ICD-10-CM | POA: Diagnosis not present

## 2014-05-16 DIAGNOSIS — B20 Human immunodeficiency virus [HIV] disease: Secondary | ICD-10-CM | POA: Diagnosis not present

## 2014-05-16 DIAGNOSIS — C7971 Secondary malignant neoplasm of right adrenal gland: Secondary | ICD-10-CM | POA: Diagnosis not present

## 2014-05-16 DIAGNOSIS — Z7901 Long term (current) use of anticoagulants: Secondary | ICD-10-CM | POA: Insufficient documentation

## 2014-05-16 DIAGNOSIS — Z7951 Long term (current) use of inhaled steroids: Secondary | ICD-10-CM | POA: Insufficient documentation

## 2014-05-16 NOTE — Progress Notes (Signed)
Please see the Nurse Progress Note in the MD Initial Consult Encounter for this patient. 

## 2014-05-16 NOTE — Progress Notes (Signed)
Radiation Oncology         [phone] ________________________________  Initial  Consultation  Name: Jaime Morris MRN: [id number]  Date: 05/16/2014  DOB: September 30, 1960  ID:[id number] Jaime John, MD  Curt Bears, MD   REFERRING PHYSICIAN: Curt Bears, MD  DIAGNOSIS: extensive stage small cell lung cancer  HISTORY OF PRESENT ILLNESS::Jaime Morris is a 53 y.o. male who is seen out of the courtesy of Dr. Julien Nordmann for an opinion concerning radiation therapy as part of management of patient's extensive stage small cell lung cancer. Earlier this year the patient underwent routine screening chest CT scan in light of the patient's tobacco use. On the scan the patient was found to have a nodule in the right lung and right hilar lymphadenopathy. 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 Biopsy revealed small cell lung cancer. The patient was felt to have extensive stage in light of his adrenal metastasis. Patient proceeded to undergo chemotherapy and most recent scans show excellent response to his therapy. The patient is now referred to radiation oncology for consideration for prophylactic cranial irradiation.  PREVIOUS RADIATION THERAPY: No  PAST MEDICAL HISTORY:  has a past medical history of HIV infection; Hyperlipidemia; Insomnia; Lung nodule, multiple (10/24/13); Gynecomastia, male (10/16/13); COPD (chronic obstructive pulmonary disease); History of depression; History of pneumonia; Small cell lung cancer; and Collapsed lung.    PAST SURGICAL HISTORY: Past Surgical History  Procedure Laterality Date  . Foot surgery      BUNIONECTOMY  . Back surgery      L 4-5 FUSION WITH SCREW  . Colonoscopy  UTD  . Portacath placement N/A 12/08/2013    Procedure: INSERTION PORT-A-CATH;  Surgeon: Grace Isaac, MD;  Location: [facility] OR;  Service: Thoracic;  Laterality: N/A;    FAMILY HISTORY: family history includes Heart disease in his  father; Melanoma in his paternal grandfather; Sarcoidosis in his sister; Stroke in his mother.  SOCIAL HISTORY:  reports that he quit smoking about 4 weeks ago. His smoking use included Cigarettes. He has a 38 pack-year smoking history. He quit smokeless tobacco use about 4 months ago. His smokeless tobacco use included Chew. He reports that he does not drink alcohol or use illicit drugs.  ALLERGIES: Lactose intolerance (gi)  MEDICATIONS:  Current Outpatient Prescriptions  Medication Sig Dispense Refill  . albuterol (PROAIR HFA) 108 (90 BASE) MCG/ACT inhaler Inhale 2 puffs into the lungs every 4 (four) hours as needed for wheezing or shortness of breath.     . doxylamine, Sleep, (UNISOM) 25 MG tablet Take 50 mg by mouth at bedtime as needed for sleep.     Marland Kitchen efavirenz-emtricitabine-tenofovir (ATRIPLA) 600-200-300 MG per tablet Take 1 tablet by mouth daily. 90 tablet 3  . Melatonin 10 MG TABS Take 10 mg by mouth at bedtime as needed (sleep).    . mirtazapine (REMERON) 15 MG tablet Take 1 tablet (15 mg total) by mouth at bedtime. 90 tablet 1  . prochlorperazine (COMPAZINE) 10 MG tablet Take 1 tablet (10 mg total) by mouth every 6 (six) hours as needed for nausea or vomiting. 30 tablet 1  . Rivaroxaban (XARELTO STARTER PACK) 15 & 20 MG TBPK Take as directed on package: Start with one 15mg  tablet by mouth twice a day with food. On Day 22, switch to one 20mg  tablet once a day with food. 51 each 0  . tiotropium (SPIRIVA) 18 MCG inhalation capsule Place 18 mcg into inhaler  and inhale daily.    . varenicline (CHANTIX) 1 MG tablet Take 1 mg by mouth 2 (two) times daily.     . fluticasone (FLONASE) 50 MCG/ACT nasal spray Place 2 sprays into both nostrils daily as needed for allergies.     Marland Kitchen HYDROcodone-acetaminophen (LORTAB) 5-325 MG per tablet Take 1-2 tablets by mouth every 6 (six) hours as needed for moderate pain. (Patient not taking: Reported on 05/16/2014) 30 tablet 0   No current  facility-administered medications for this encounter.   Facility-Administered Medications Ordered in Other Encounters  Medication Dose Route Frequency Provider Last Rate Last Dose  . sodium chloride 0.9 % injection 10 mL  10 mL Intracatheter PRN Curt Bears, MD   10 mL at 01/30/14 1451    REVIEW OF SYSTEMS:  A 15 point review of systems is documented in the electronic medical record. This was obtained by the nursing staff. However, I reviewed this with the patient to discuss relevant findings and make appropriate changes. The patient denies any headaches or visual problems. He denies nausea. He does have a chronic cough and has recently been treated for pulmonary embolus and pneumonia. He denies any pain within the chest area or hemoptysis. Patient denies any new bony pain.   PHYSICAL EXAM:  height is 5\' 10"  (1.778 m) and weight is 196 lb 12.8 oz (89.268 kg). His oral temperature is 98.7 F (37.1 C). His blood pressure is 145/93 and his pulse is 94. His respiration is 20 and oxygen saturation is 93%.   BP 145/93 mmHg  Pulse 94  Temp(Src) 98.7 F (37.1 C) (Oral)  Resp 20  Ht 5\' 10"  (1.778 m)  Wt 196 lb 12.8 oz (89.268 kg)  BMI 28.24 kg/m2  SpO2 93%  General Appearance:    Alert, cooperative, no distress, appears stated age  Head:    Normocephalic, without obvious abnormality, atraumatic  Eyes:    PERRL, conjunctiva/corneas clear, EOM's intact,             Nose:   Nares normal, septum midline, mucosa normal, no drainage    or sinus tenderness  Throat:   Lips, mucosa, and tongue normal; gums normal  Neck:   Supple, symmetrical, trachea midline, no adenopathy;       thyroid:  No enlargement/tenderness/nodules; no carotid   bruit or JVD  Back:     Symmetric, no curvature, ROM normal, no CVA tenderness  Lungs:     Clear to auscultation bilaterally, respirations unlabored  Chest wall:    No tenderness or deformity  Heart:    Regular rate and rhythm, S1 and S2 normal, no murmur, rub    or gallop  Abdomen:     Soft, non-tender, bowel sounds active all four quadrants,    no masses, no organomegaly        Extremities:   Extremities normal, atraumatic, no cyanosis or edema  Pulses:   2+ and symmetric all extremities  Skin:   Skin color, texture, turgor normal, no rashes or lesions  Lymph nodes:   Cervical, supraclavicular, and axillary nodes normal  Neurologic:   CNII-XII intact. Normal strength, sensation and reflexes      throughout     ECOG = 0  0 - Asymptomatic (Fully active, able to carry on all predisease activities without restriction)  LABORATORY DATA:  Lab Results  Component Value Date   WBC 5.6 05/01/2014   HGB 14.0 05/01/2014   HCT 42.6 05/01/2014   MCV 97.7 05/01/2014   PLT  281 05/01/2014   NEUTROABS 2.8 05/01/2014   Lab Results  Component Value Date   NA 138 05/01/2014   K 4.6 05/01/2014   CL 102 01/22/2014   CO2 24 05/01/2014   GLUCOSE 99 05/01/2014   CREATININE 0.8 05/01/2014   CALCIUM 9.4 05/01/2014      RADIOGRAPHY: Ct Chest W Contrast  04/18/2014   CLINICAL DATA:  Squamous cell carcinoma of the lung restaging  EXAM: CT CHEST, ABDOMEN, AND PELVIS WITH CONTRAST  TECHNIQUE: Multidetector CT imaging of the chest, abdomen and pelvis was performed following the standard protocol during bolus administration of intravenous contrast.  CONTRAST:  148mL OMNIPAQUE IOHEXOL 300 MG/ML  SOLN  COMPARISON:  02/16/2014  FINDINGS: CT CHEST FINDINGS  Mediastinum: The heart size is normal. There is no pericardial effusion. The trachea appears patent and is midline. Normal appearance of the esophagus. No enlarged mediastinal or hilar lymph nodes. There is a filling defect within the posterior and basilar segment of right lower lobe pulmonary artery consistent with embolus.  Lungs/Pleura: Moderate changes of centrilobular and paraseptal emphysema are noted. No pleural effusion identified. There is a wedge-shaped area of consolidation/atelectasis involving the right  middle lobe which is new from previous exam. This measures 2.6 x 3.1 cm, image number 41/series 2. The small nodules within the right middle lobe and right lower lobe are stable when compared with the previous exam measuring up to 4 mm, image number 39/series 4. Stable 3 mm left upper lobe nodule, image 33/series 4.  Musculoskeletal: Review of the visualized osseous structures is significant for mild spondylosis within the thoracic spine. There is no aggressive lytic or sclerotic bone lesions identified.  CT ABDOMEN AND PELVIS FINDINGS  Hepatobiliary: No focal liver abnormality identified. The gallbladder appears normal. No biliary dilatation.  Pancreas: Normal appearance of the pancreas.  Spleen: The spleen is unremarkable.  Adrenals/Urinary Tract: The adrenal glands are both normal. Normal appearance of both kidneys.  Stomach/Bowel: The stomach is normal. The small bowel loops have a normal course and caliber without obstruction. The appendix is visualized and appears normal. Normal appearance of the colon.  Vascular/Lymphatic: There is calcified atherosclerotic disease involving the abdominal aorta. No aneurysm. There is no retroperitoneal adenopathy identified. No pelvic or inguinal adenopathy identified.  Reproductive: The prostate gland appears enlarged. Symmetric appearance of the seminal vesicles.  Other: There is no free fluid or fluid collections within the abdomen or pelvis.  Musculoskeletal: Review of the visualized osseous structures is negative for aggressive lytic or sclerotic bone lesion. Advanced lumbar degenerative disc disease is noted. Significant anterolisthesis of L5 on S1 is again noted. The patient is status post hardware fixation at the L4 through S1 level.  IMPRESSION: 1. New masslike consolidation in the right middle lobe is nonspecific. Short-term followup imaging is recommended to ensure resolution. If this does not resolve then consider a PET-CT to assess for malignant range FDG uptake  within this area suggestive of recurrent tumor. 2. Multiple small pulmonary nodules are identified within both lungs and appear unchanged from the previous exam. 3. Abnormal pulmonary arterial filling defect within posterior basal branch of the right lower lobe pulmonary artery consistent with pulmonary embolus. 4. No evidence for metastatic disease to the abdomen or pelvis.   Electronically Signed   By: Kerby Moors M.D.   On: 04/18/2014 16:20   Ct Abdomen Pelvis W Contrast  04/18/2014   CLINICAL DATA:  Squamous cell carcinoma of the lung restaging  EXAM: CT CHEST, ABDOMEN, AND PELVIS  WITH CONTRAST  TECHNIQUE: Multidetector CT imaging of the chest, abdomen and pelvis was performed following the standard protocol during bolus administration of intravenous contrast.  CONTRAST:  145mL OMNIPAQUE IOHEXOL 300 MG/ML  SOLN  COMPARISON:  02/16/2014  FINDINGS: CT CHEST FINDINGS  Mediastinum: The heart size is normal. There is no pericardial effusion. The trachea appears patent and is midline. Normal appearance of the esophagus. No enlarged mediastinal or hilar lymph nodes. There is a filling defect within the posterior and basilar segment of right lower lobe pulmonary artery consistent with embolus.  Lungs/Pleura: Moderate changes of centrilobular and paraseptal emphysema are noted. No pleural effusion identified. There is a wedge-shaped area of consolidation/atelectasis involving the right middle lobe which is new from previous exam. This measures 2.6 x 3.1 cm, image number 41/series 2. The small nodules within the right middle lobe and right lower lobe are stable when compared with the previous exam measuring up to 4 mm, image number 39/series 4. Stable 3 mm left upper lobe nodule, image 33/series 4.  Musculoskeletal: Review of the visualized osseous structures is significant for mild spondylosis within the thoracic spine. There is no aggressive lytic or sclerotic bone lesions identified.  CT ABDOMEN AND PELVIS  FINDINGS  Hepatobiliary: No focal liver abnormality identified. The gallbladder appears normal. No biliary dilatation.  Pancreas: Normal appearance of the pancreas.  Spleen: The spleen is unremarkable.  Adrenals/Urinary Tract: The adrenal glands are both normal. Normal appearance of both kidneys.  Stomach/Bowel: The stomach is normal. The small bowel loops have a normal course and caliber without obstruction. The appendix is visualized and appears normal. Normal appearance of the colon.  Vascular/Lymphatic: There is calcified atherosclerotic disease involving the abdominal aorta. No aneurysm. There is no retroperitoneal adenopathy identified. No pelvic or inguinal adenopathy identified.  Reproductive: The prostate gland appears enlarged. Symmetric appearance of the seminal vesicles.  Other: There is no free fluid or fluid collections within the abdomen or pelvis.  Musculoskeletal: Review of the visualized osseous structures is negative for aggressive lytic or sclerotic bone lesion. Advanced lumbar degenerative disc disease is noted. Significant anterolisthesis of L5 on S1 is again noted. The patient is status post hardware fixation at the L4 through S1 level.  IMPRESSION: 1. New masslike consolidation in the right middle lobe is nonspecific. Short-term followup imaging is recommended to ensure resolution. If this does not resolve then consider a PET-CT to assess for malignant range FDG uptake within this area suggestive of recurrent tumor. 2. Multiple small pulmonary nodules are identified within both lungs and appear unchanged from the previous exam. 3. Abnormal pulmonary arterial filling defect within posterior basal branch of the right lower lobe pulmonary artery consistent with pulmonary embolus. 4. No evidence for metastatic disease to the abdomen or pelvis.   Electronically Signed   By: Kerby Moors M.D.   On: 04/18/2014 16:20      IMPRESSION: extensive stage small cell lung cancer, presenting with  limited metastatic disease. The patient has an excellent performance status,  is young and has had a good response to his systemic chemotherapy. In this situation I would recommend prophylactic cranial irradiation. I discussed the treatment course side effects and potential toxicities of low-dose radiation therapy to the brain with the patient. He appears to understand and wishes to proceed with planned course of treatment.  PLAN:MRI the brain followed by simulation     ------------------------------------------------  Blair Promise, PhD, MD

## 2014-05-17 ENCOUNTER — Telehealth: Payer: Self-pay | Admitting: *Deleted

## 2014-05-17 NOTE — Telephone Encounter (Signed)
Called patient to inform of MRI on 05/28/14 @ 9 am @  [facility] and his sim on 05/29/14 @ 8 am @ Dr. Clabe Seal Office, lvm for a return call.

## 2014-05-18 ENCOUNTER — Encounter: Payer: Self-pay | Admitting: *Deleted

## 2014-05-18 NOTE — Progress Notes (Signed)
[facility] Psychosocial Distress Screening Clinical Social Work  Clinical Social Work was referred by distress screening protocol.  The patient scored a 5 on the Psychosocial Distress Thermometer which indicates moderate distress. Clinical Social Worker attempted to contact patient to assess for distress and other psychosocial needs.   ONCBCN DISTRESS SCREENING 05/16/2014  Screening Type Initial Screening  Distress experienced in past week (1-10) 5  Practical problem type   Emotional problem type Nervousness/Anxiety;Adjusting to illness;Boredom  Information Concerns Type   Physical Problem type Nausea/vomiting;Sleep/insomnia  Physician notified of physical symptoms Yes    Clinical Social Worker follow up needed: Yes.  CSW left voicemail for patient to return call when convenient.  CSW will await patient call.  If yes, follow up plan:  Jaime Morris, MSW, LCSW, OSW-C Clinical Social Worker [facility] [phone]

## 2014-05-24 ENCOUNTER — Telehealth (HOSPITAL_COMMUNITY): Payer: Self-pay

## 2014-05-24 NOTE — Telephone Encounter (Signed)
I have called and left a message with Jaishaun to inquire about participation in Pulmonary Rehab. Will send letter in mail and follow up.

## 2014-05-25 ENCOUNTER — Telehealth: Payer: Self-pay | Admitting: Medical Oncology

## 2014-05-25 ENCOUNTER — Telehealth (HOSPITAL_COMMUNITY): Payer: Self-pay

## 2014-05-25 NOTE — Telephone Encounter (Signed)
Pt asking if he can take tylenol while on xarelto. I told him I was not sure but to cal his local pharmacist. He said he will.

## 2014-05-25 NOTE — Telephone Encounter (Signed)
I have called and left a message with [facility] to inquire about participation in Pulmonary Rehab. Will send letter in mail and follow up.

## 2014-05-25 NOTE — Telephone Encounter (Signed)
Called patient regarding entrance to Pulmonary Rehab.  Patient states that they are interested in attending the program.  Jaime Morris is going to verify insurance coverage and follow up.

## 2014-05-28 ENCOUNTER — Other Ambulatory Visit: Payer: Self-pay | Admitting: Internal Medicine

## 2014-05-28 ENCOUNTER — Ambulatory Visit (HOSPITAL_COMMUNITY)
Admission: RE | Admit: 2014-05-28 | Discharge: 2014-05-28 | Disposition: A | Discharge status: Home / Self Care | Payer: 59 | Source: Ambulatory Visit | Attending: Radiation Oncology | Admitting: Radiation Oncology

## 2014-05-28 DIAGNOSIS — I2699 Other pulmonary embolism without acute cor pulmonale: Secondary | ICD-10-CM

## 2014-05-28 DIAGNOSIS — C3491 Malignant neoplasm of unspecified part of right bronchus or lung: Secondary | ICD-10-CM | POA: Diagnosis not present

## 2014-05-28 MED ORDER — GADOBENATE DIMEGLUMINE 529 MG/ML IV SOLN
18.0000 mL | Freq: Once | INTRAVENOUS | Status: AC | PRN
  Administered 2014-05-28: 18 mL via INTRAVENOUS

## 2014-05-29 ENCOUNTER — Ambulatory Visit
Admission: RE | Admit: 2014-05-29 | Discharge: 2014-05-29 | Disposition: A | Discharge status: Home / Self Care | Payer: 59 | Source: Ambulatory Visit | Attending: Radiation Oncology | Admitting: Radiation Oncology

## 2014-05-29 ENCOUNTER — Encounter: Payer: Self-pay | Admitting: Pulmonary Disease

## 2014-05-29 DIAGNOSIS — Z510 Encounter for antineoplastic radiation therapy: Secondary | ICD-10-CM | POA: Diagnosis not present

## 2014-05-29 DIAGNOSIS — C3490 Malignant neoplasm of unspecified part of unspecified bronchus or lung: Secondary | ICD-10-CM

## 2014-05-29 MED ORDER — RIVAROXABAN 20 MG PO TABS: 20.0000 mg | ORAL_TABLET | Freq: Every day | ORAL | Status: DC

## 2014-05-29 MED ORDER — LORAZEPAM 1 MG PO TABS
1.0000 mg | ORAL_TABLET | Freq: Once | ORAL | Status: AC
  Administered 2014-05-29: 1 mg via SUBLINGUAL
  Filled 2014-05-29: qty 1

## 2014-05-29 NOTE — Telephone Encounter (Signed)
Pt would like to switch to Symbicort. Spiriva Respimat is not working well.  RA - please advise. Thanks.

## 2014-05-29 NOTE — Progress Notes (Signed)
Dr. Sondra Come asked to give patient 1mg  ativan sl x 1 now, verified medication with Varney Baas,  asked patient in CT Sim name and date of birth, instructed patient no driving today, after ct sim go home and rest,patient agreed, gave the ativan sl 8:35 AM

## 2014-05-29 NOTE — Telephone Encounter (Signed)
Ok to try symbicrot 160 twice daily

## 2014-05-30 DIAGNOSIS — Z510 Encounter for antineoplastic radiation therapy: Secondary | ICD-10-CM | POA: Diagnosis not present

## 2014-05-30 MED ORDER — TIOTROPIUM BROMIDE MONOHYDRATE 2.5 MCG/ACT IN AERS: 2.0000 | INHALATION_SPRAY | Freq: Every day | RESPIRATORY_TRACT | Status: DC

## 2014-05-30 MED ORDER — BUDESONIDE-FORMOTEROL FUMARATE 160-4.5 MCG/ACT IN AERO: 2.0000 | INHALATION_SPRAY | Freq: Two times a day (BID) | RESPIRATORY_TRACT | Status: DC

## 2014-05-30 NOTE — Telephone Encounter (Signed)
Dr. Elsworth Morris pt is not wanting to change the spiriva resp, he is wanting to try the symbicort and the spiriva together. Please advise if okay thanks

## 2014-05-30 NOTE — Telephone Encounter (Signed)
lmomtcb x1 for pt 

## 2014-05-30 NOTE — Telephone Encounter (Signed)
[phone] calling back

## 2014-05-30 NOTE — Telephone Encounter (Signed)
Called spoke with patient and advised of RA's approval to try both the Symbicort 160 and Spiriva Respimat together Pt is requesting rx's be sent to [facility] w/ a 90day supply - done Nothing further needed; will sign off

## 2014-05-30 NOTE — Telephone Encounter (Signed)
OK to Rx both His lung function is good & hope he will not need both long term

## 2014-05-30 NOTE — Progress Notes (Signed)
  Radiation Oncology         [phone] ________________________________  Name: SINAN TUCH MRN: [id number]  Date: 05/29/2014  DOB: [birth year]  SIMULATION AND TREATMENT PLANNING NOTE    ICD-9-CM ICD-10-CM   1. Small cell carcinoma of lung, unspecified laterality 162.9 C34.90 LORazepam (ATIVAN) tablet 1 mg    DIAGNOSIS:  Extensive stage small cell lung cancer for prophylactic cranial irradiation  NARRATIVE:  The patient was brought to the [facility].  Identity was confirmed.  All relevant records and images related to the planned course of therapy were reviewed.  The patient freely provided informed written consent to proceed with treatment after reviewing the details related to the planned course of therapy. The consent form was witnessed and verified by the simulation staff.  Then, the patient was set-up in a stable reproducible  supine position for radiation therapy.  CT images were obtained.  Surface markings were placed.  The CT images were loaded into the planning software.  Then the target and avoidance structures were contoured.  Treatment planning then occurred.  The radiation prescription was entered and confirmed.  Then, I designed and supervised the construction of a total of 3 medically necessary complex treatment devices.  I have requested : Isodose Plan.  I have ordered:dose calc.  PLAN:  The patient will receive 25 Gy in 10 fractions.  ________________________________  -----------------------------------  Blair Promise, PhD, MD

## 2014-05-31 ENCOUNTER — Other Ambulatory Visit: Payer: 59

## 2014-06-05 ENCOUNTER — Ambulatory Visit: Payer: 59 | Admitting: Radiation Oncology

## 2014-06-05 ENCOUNTER — Ambulatory Visit
Admission: RE | Admit: 2014-06-05 | Discharge: 2014-06-05 | Disposition: A | Discharge status: Home / Self Care | Payer: 59 | Source: Ambulatory Visit | Attending: Radiation Oncology | Admitting: Radiation Oncology

## 2014-06-05 ENCOUNTER — Encounter: Payer: Self-pay | Admitting: Radiation Oncology

## 2014-06-05 VITALS — BP 148/76 | HR 82 | Temp 97.9°F | Resp 16 | Ht 70.0 in | Wt 189.3 lb

## 2014-06-05 DIAGNOSIS — M25562 Pain in left knee: Secondary | ICD-10-CM | POA: Diagnosis not present

## 2014-06-05 DIAGNOSIS — C3490 Malignant neoplasm of unspecified part of unspecified bronchus or lung: Secondary | ICD-10-CM | POA: Diagnosis not present

## 2014-06-05 DIAGNOSIS — W19XXXA Unspecified fall, initial encounter: Secondary | ICD-10-CM | POA: Diagnosis not present

## 2014-06-05 DIAGNOSIS — Z510 Encounter for antineoplastic radiation therapy: Secondary | ICD-10-CM | POA: Diagnosis not present

## 2014-06-05 MED ORDER — BIAFINE EX EMUL
Freq: Once | CUTANEOUS | Status: AC
  Administered 2014-06-05: 11:00:00 via TOPICAL

## 2014-06-05 NOTE — Progress Notes (Signed)
  Radiation Oncology         [phone]) [phone] ________________________________  Name: Jaime Morris MRN: [id number]  Date: 06/05/2014  DOB: 1961-05-09  Simulation Verification Note    ICD-9-CM ICD-10-CM   1. Small cell carcinoma of lung, unspecified laterality 162.9 C34.90     Status: outpatient  NARRATIVE: The patient was brought to the treatment unit and placed in the planned treatment position. The clinical setup was verified. Then port films were obtained and uploaded to the radiation oncology medical record software.  The treatment beams were carefully compared against the planned radiation fields. The position location and shape of the radiation fields was reviewed. They targeted volume of tissue appears to be appropriately covered by the radiation beams. Organs at risk appear to be excluded as planned.  Based on my personal review, I approved the simulation verification. The patient's treatment will proceed as planned.  -----------------------------------  Blair Promise, PhD, MD

## 2014-06-05 NOTE — Progress Notes (Signed)
Stephan Minister had completed 1 fraction to his brain.  He reports pain in his left knee from falling a month ago.  He rates the pain at a 4/10 and is taking percocet as needed.  He reports having a frequent dry cough.  He was given the Radiation Therapy and You and reviewed potential side effects including fatigue, skin changes, hearing changes and nausea.  He was given the skin care handout as well as biafine lotion.  He was instructed to apply the biafine twice a day to his forehead and scalp area, after treatment and at bedtime.

## 2014-06-05 NOTE — Addendum Note (Signed)
Encounter addended by: Blair Promise, MD on: 06/05/2014  8:53 PM<BR>     Documentation filed: Notes Section

## 2014-06-05 NOTE — Progress Notes (Signed)
  Radiation Oncology         [phone]) [phone] ________________________________  Name: Jaime Morris MRN: [id number]  Date: 06/05/2014  DOB: 05/05/1961  Weekly Radiation Therapy Management  Small cell carcinoma of lung   Staging form: Lung, AJCC 7th Edition     Clinical: Stage IV (T1a, N1, M1b) - Signed by Curt Bears, MD on 12/07/2013     Pathologic: No stage assigned - Unsigned   Current Dose: 2.5 Gy     Planned Dose:  25 Gy directed at the whole brain as prophylaxis  Narrative . . . . . . . . The patient presents for routine under treatment assessment.                                   The patient is without complaint. He denies any nausea or headaches.                                  Set-up films were reviewed.                                 The chart was checked. Physical Findings. . .  vitals were not taken for this visit.. No scalp irritation. The oral cavity is free of secondary infection Impression . . . . . . . The patient is tolerating radiation. Plan . . . . . . . . . . . . Continue treatment as planned. The patient will be started on steroids if he develops nausea or headaches.  ________________________________   Blair Promise, PhD, MD

## 2014-06-06 ENCOUNTER — Ambulatory Visit: Payer: 59 | Admitting: Internal Medicine

## 2014-06-06 ENCOUNTER — Ambulatory Visit: Payer: 59

## 2014-06-06 ENCOUNTER — Ambulatory Visit
Admission: RE | Admit: 2014-06-06 | Discharge: 2014-06-06 | Disposition: A | Discharge status: Home / Self Care | Payer: 59 | Source: Ambulatory Visit | Attending: Radiation Oncology | Admitting: Radiation Oncology

## 2014-06-06 DIAGNOSIS — Z510 Encounter for antineoplastic radiation therapy: Secondary | ICD-10-CM | POA: Diagnosis not present

## 2014-06-07 ENCOUNTER — Ambulatory Visit
Admission: RE | Admit: 2014-06-07 | Discharge: 2014-06-07 | Disposition: A | Discharge status: Home / Self Care | Payer: 59 | Source: Ambulatory Visit | Attending: Radiation Oncology | Admitting: Radiation Oncology

## 2014-06-07 DIAGNOSIS — Z510 Encounter for antineoplastic radiation therapy: Secondary | ICD-10-CM | POA: Diagnosis not present

## 2014-06-11 ENCOUNTER — Ambulatory Visit
Admission: RE | Admit: 2014-06-11 | Discharge: 2014-06-11 | Disposition: A | Discharge status: Home / Self Care | Payer: 59 | Source: Ambulatory Visit | Attending: Radiation Oncology | Admitting: Radiation Oncology

## 2014-06-11 DIAGNOSIS — Z510 Encounter for antineoplastic radiation therapy: Secondary | ICD-10-CM | POA: Diagnosis not present

## 2014-06-12 ENCOUNTER — Ambulatory Visit
Admission: RE | Admit: 2014-06-12 | Discharge: 2014-06-12 | Disposition: A | Discharge status: Home / Self Care | Payer: 59 | Source: Ambulatory Visit | Attending: Radiation Oncology | Admitting: Radiation Oncology

## 2014-06-12 DIAGNOSIS — Z510 Encounter for antineoplastic radiation therapy: Secondary | ICD-10-CM | POA: Diagnosis not present

## 2014-06-13 ENCOUNTER — Ambulatory Visit
Admission: RE | Admit: 2014-06-13 | Discharge: 2014-06-13 | Disposition: A | Discharge status: Home / Self Care | Payer: 59 | Source: Ambulatory Visit | Attending: Radiation Oncology | Admitting: Radiation Oncology

## 2014-06-13 ENCOUNTER — Other Ambulatory Visit: Payer: Self-pay | Admitting: *Deleted

## 2014-06-13 DIAGNOSIS — B20 Human immunodeficiency virus [HIV] disease: Secondary | ICD-10-CM

## 2014-06-13 DIAGNOSIS — Z510 Encounter for antineoplastic radiation therapy: Secondary | ICD-10-CM | POA: Diagnosis not present

## 2014-06-13 MED ORDER — EFAVIRENZ-EMTRICITAB-TENOFOVIR 600-200-300 MG PO TABS: 1.0000 | ORAL_TABLET | Freq: Every day | ORAL | Status: DC

## 2014-06-14 ENCOUNTER — Ambulatory Visit
Admission: RE | Admit: 2014-06-14 | Discharge: 2014-06-14 | Disposition: A | Discharge status: Home / Self Care | Payer: 59 | Source: Ambulatory Visit | Attending: Radiation Oncology | Admitting: Radiation Oncology

## 2014-06-14 DIAGNOSIS — Z510 Encounter for antineoplastic radiation therapy: Secondary | ICD-10-CM | POA: Diagnosis not present

## 2014-06-18 ENCOUNTER — Ambulatory Visit
Admission: RE | Admit: 2014-06-18 | Discharge: 2014-06-18 | Disposition: A | Discharge status: Home / Self Care | Payer: 59 | Source: Ambulatory Visit | Attending: Radiation Oncology | Admitting: Radiation Oncology

## 2014-06-18 DIAGNOSIS — Z510 Encounter for antineoplastic radiation therapy: Secondary | ICD-10-CM | POA: Diagnosis not present

## 2014-06-19 ENCOUNTER — Ambulatory Visit
Admission: RE | Admit: 2014-06-19 | Discharge: 2014-06-19 | Disposition: A | Discharge status: Home / Self Care | Payer: 59 | Source: Ambulatory Visit | Attending: Radiation Oncology | Admitting: Radiation Oncology

## 2014-06-19 VITALS — BP 162/94 | HR 87 | Temp 98.1°F | Resp 16 | Ht 70.0 in | Wt 193.8 lb

## 2014-06-19 DIAGNOSIS — R110 Nausea: Secondary | ICD-10-CM | POA: Diagnosis not present

## 2014-06-19 DIAGNOSIS — Z510 Encounter for antineoplastic radiation therapy: Secondary | ICD-10-CM | POA: Diagnosis not present

## 2014-06-19 DIAGNOSIS — R51 Headache: Secondary | ICD-10-CM | POA: Diagnosis not present

## 2014-06-19 DIAGNOSIS — C3490 Malignant neoplasm of unspecified part of unspecified bronchus or lung: Secondary | ICD-10-CM | POA: Insufficient documentation

## 2014-06-19 MED ORDER — BIAFINE EX EMUL
Freq: Once | CUTANEOUS | Status: AC
  Administered 2014-06-19: 13:00:00 via TOPICAL

## 2014-06-19 NOTE — Progress Notes (Signed)
Jaime Morris has completed 9 fractions to his whole brain.  He reports having a slight headache today and has had occasional headaches since radiation started.  He has been taking percocet which puts him to sleep.  He reports having nausea and a vomiting episode on New Year's day.  He reports having a poor appetite and not eating very much.  His weight is up 4 lbs since 06/05/14.  He denies dizziness.  He reports having occasional difficulty reading.  The skin on his scalp, forehead and ears has hyperpigmentation.  He has been using biafine once per day.  Advised him to increase using it to at least twice a day.  He reports fatigue.  He has been given a one month follow up card.

## 2014-06-19 NOTE — Progress Notes (Signed)
  Radiation Oncology         [phone]) [phone] ________________________________  Name: Jaime Morris MRN: [id number]  Date: 06/19/2014  DOB: Oct 17, 1960  Weekly Radiation Therapy Management  Small cell carcinoma of lung   Staging form: Lung, AJCC 7th Edition     Clinical: Stage IV (T1a, N1, M1b) -      Pathologic: No stage assigned - Unsigned   Current Dose: 22.5 Gy     Planned Dose:  25 Gy directed at the brain as prophylaxis  Narrative . . . . . . . . The patient presents for routine under treatment assessment.                                   The patient is having some nausea and mild headaches; does have some scalp dryness and irritation and has been given Biafine for this issue                                 Set-up films were reviewed.                                 The chart was checked. Physical Findings. . .  height is 5\' 10"  (1.778 m) and weight is 193 lb 12.8 oz (87.907 kg). His oral temperature is 98.1 F (36.7 C). His blood pressure is 162/94 and his pulse is 87. His respiration is 16 and oxygen saturation is 100%. . Weight essentially stable.  The lungs are clear. The heart has a regular rhythm and rate. The abdomen is soft. The scalp area shows some dry desquamation and hyperpigmentation changes. The oral cavity is free of secondary infection. Impression . . . . . . . The patient is tolerating radiation. Plan . . . . . . . . . . . . Continue treatment as planned.  ________________________________   Blair Promise, PhD, MD

## 2014-06-19 NOTE — Addendum Note (Signed)
Encounter addended by: Jacqulyn Liner, RN on: 06/19/2014 11:53 AM<BR>     Documentation filed: Medications, Dx Association, Orders

## 2014-06-19 NOTE — Addendum Note (Signed)
Encounter addended by: Jacqulyn Liner, RN on: 06/19/2014  1:26 PM<BR>     Documentation filed: Inpatient MAR

## 2014-06-20 ENCOUNTER — Ambulatory Visit: Payer: 59

## 2014-06-20 ENCOUNTER — Ambulatory Visit
Admission: RE | Admit: 2014-06-20 | Discharge: 2014-06-20 | Disposition: A | Discharge status: Home / Self Care | Payer: 59 | Source: Ambulatory Visit | Attending: Radiation Oncology | Admitting: Radiation Oncology

## 2014-06-20 DIAGNOSIS — Z510 Encounter for antineoplastic radiation therapy: Secondary | ICD-10-CM | POA: Diagnosis not present

## 2014-06-21 ENCOUNTER — Ambulatory Visit: Payer: 59

## 2014-06-22 ENCOUNTER — Telehealth (HOSPITAL_COMMUNITY): Payer: Self-pay

## 2014-06-22 NOTE — Telephone Encounter (Signed)
Called patient to discuss insurance and entry into Pulmonary Rehab.  Patient is responsible for the cost of the program until he meets his deductible.  I offered [facility] our Pulmonary Maintenance Program which is paid out of pocket monthly.  The patient is signed up to come in for orientation on 07/13/14.

## 2014-06-27 ENCOUNTER — Ambulatory Visit (HOSPITAL_COMMUNITY)
Admission: RE | Admit: 2014-06-27 | Discharge: 2014-06-27 | Disposition: A | Discharge status: Home / Self Care | Payer: 59 | Source: Ambulatory Visit | Attending: Internal Medicine | Admitting: Internal Medicine

## 2014-06-27 ENCOUNTER — Ambulatory Visit: Payer: 59

## 2014-06-27 ENCOUNTER — Encounter (HOSPITAL_COMMUNITY): Payer: Self-pay

## 2014-06-27 ENCOUNTER — Other Ambulatory Visit (HOSPITAL_BASED_OUTPATIENT_CLINIC_OR_DEPARTMENT_OTHER): Payer: 59

## 2014-06-27 VITALS — BP 165/89 | HR 98 | Temp 98.2°F

## 2014-06-27 DIAGNOSIS — C3491 Malignant neoplasm of unspecified part of right bronchus or lung: Secondary | ICD-10-CM | POA: Insufficient documentation

## 2014-06-27 DIAGNOSIS — Z95828 Presence of other vascular implants and grafts: Secondary | ICD-10-CM

## 2014-06-27 DIAGNOSIS — Z87891 Personal history of nicotine dependence: Secondary | ICD-10-CM | POA: Diagnosis not present

## 2014-06-27 DIAGNOSIS — C3410 Malignant neoplasm of upper lobe, unspecified bronchus or lung: Secondary | ICD-10-CM

## 2014-06-27 LAB — COMPREHENSIVE METABOLIC PANEL (CC13)
ALT: 50 U/L (ref 0–55)
ANION GAP: 10 meq/L (ref 3–11)
AST: 45 U/L — ABNORMAL HIGH (ref 5–34)
Albumin: 4 g/dL (ref 3.5–5.0)
Alkaline Phosphatase: 134 U/L (ref 40–150)
BUN: 6.5 mg/dL — AB (ref 7.0–26.0)
CO2: 23 meq/L (ref 22–29)
CREATININE: 0.8 mg/dL (ref 0.7–1.3)
Calcium: 9 mg/dL (ref 8.4–10.4)
Chloride: 104 mEq/L (ref 98–109)
EGFR: >90 mL/min/{1.73_m2} (ref >90)
GLUCOSE: 95 mg/dL (ref 70–140)
Potassium: 4.1 mEq/L (ref 3.5–5.1)
Sodium: 137 mEq/L (ref 136–145)
Total Bilirubin: 0.28 mg/dL (ref 0.20–1.20)
Total Protein: 7.6 g/dL (ref 6.4–8.3)

## 2014-06-27 LAB — CBC WITH DIFFERENTIAL/PLATELET
BASO%: 0.6 % (ref 0.0–2.0)
Basophils Absolute: 0 10*3/uL (ref 0.0–0.1)
EOS ABS: 0.1 10*3/uL (ref 0.0–0.5)
EOS%: 1.1 % (ref 0.0–7.0)
HEMATOCRIT: 45.3 % (ref 38.4–49.9)
HEMOGLOBIN: 15.6 g/dL (ref 13.0–17.1)
LYMPH%: 20.9 % (ref 14.0–49.0)
MCH: 32.2 pg (ref 27.2–33.4)
MCHC: 34.4 g/dL (ref 32.0–36.0)
MCV: 93.6 fL (ref 79.3–98.0)
MONO#: 0.6 10*3/uL (ref 0.1–0.9)
MONO%: 8.6 % (ref 0.0–14.0)
NEUT#: 4.5 10*3/uL (ref 1.5–6.5)
NEUT%: 68.8 % (ref 39.0–75.0)
PLATELETS: 187 10*3/uL (ref 140–400)
RBC: 4.84 10*6/uL (ref 4.20–5.82)
RDW: 13.6 % (ref 11.0–14.6)
WBC: 6.5 10*3/uL (ref 4.0–10.3)
lymph#: 1.4 10*3/uL (ref 0.9–3.3)

## 2014-06-27 MED ORDER — SODIUM CHLORIDE 0.9 % IJ SOLN
10.0000 mL | INTRAMUSCULAR | Status: DC | PRN
  Administered 2014-06-27: 10 mL via INTRAVENOUS
  Filled 2014-06-27: qty 10

## 2014-06-27 MED ORDER — IOHEXOL 300 MG/ML  SOLN
100.0000 mL | Freq: Once | INTRAMUSCULAR | Status: AC | PRN
  Administered 2014-06-27: 100 mL via INTRAVENOUS

## 2014-06-27 MED ORDER — HEPARIN SOD (PORK) LOCK FLUSH 100 UNIT/ML IV SOLN
500.0000 [IU] | Freq: Once | INTRAVENOUS | Status: AC
  Administered 2014-06-27: 500 [IU] via INTRAVENOUS
  Filled 2014-06-27: qty 5

## 2014-06-27 NOTE — Patient Instructions (Signed)

## 2014-07-04 ENCOUNTER — Ambulatory Visit (HOSPITAL_BASED_OUTPATIENT_CLINIC_OR_DEPARTMENT_OTHER): Payer: 59 | Admitting: Internal Medicine

## 2014-07-04 ENCOUNTER — Encounter: Payer: Self-pay | Admitting: Internal Medicine

## 2014-07-04 ENCOUNTER — Telehealth: Payer: Self-pay | Admitting: Internal Medicine

## 2014-07-04 VITALS — BP 138/80 | HR 93 | Temp 98.4°F | Resp 18 | Ht 70.0 in | Wt 190.4 lb

## 2014-07-04 DIAGNOSIS — C3490 Malignant neoplasm of unspecified part of unspecified bronchus or lung: Secondary | ICD-10-CM

## 2014-07-04 DIAGNOSIS — C3411 Malignant neoplasm of upper lobe, right bronchus or lung: Secondary | ICD-10-CM

## 2014-07-04 DIAGNOSIS — I2699 Other pulmonary embolism without acute cor pulmonale: Secondary | ICD-10-CM

## 2014-07-04 NOTE — Patient Instructions (Signed)
Smoking Cessation, Tips for Success  If you are ready to quit smoking, congratulations! You have chosen to help yourself be healthier. Cigarettes bring nicotine, tar, carbon monoxide, and other irritants into your body. Your lungs, heart, and blood vessels will be able to work better without these poisons. There are many different ways to quit smoking. Nicotine gum, nicotine patches, a nicotine inhaler, or nicotine nasal spray can help with physical craving. Hypnosis, support groups, and medicines help break the habit of smoking.  WHAT THINGS CAN I DO TO MAKE QUITTING EASIER?   Here are some tips to help you quit for good:  · Pick a date when you will quit smoking completely. Tell all of your friends and family about your plan to quit on that date.  · Do not try to slowly cut down on the number of cigarettes you are smoking. Pick a quit date and quit smoking completely starting on that day.  · Throw away all cigarettes.    · Clean and remove all ashtrays from your home, work, and car.  · On a card, write down your reasons for quitting. Carry the card with you and read it when you get the urge to smoke.  · Cleanse your body of nicotine. Drink enough water and fluids to keep your urine clear or pale yellow. Do this after quitting to flush the nicotine from your body.  · Learn to predict your moods. Do not let a bad situation be your excuse to have a cigarette. Some situations in your life might tempt you into wanting a cigarette.  · Never have "just one" cigarette. It leads to wanting another and another. Remind yourself of your decision to quit.  · Change habits associated with smoking. If you smoked while driving or when feeling stressed, try other activities to replace smoking. Stand up when drinking your coffee. Brush your teeth after eating. Sit in a different chair when you read the paper. Avoid alcohol while trying to quit, and try to drink fewer caffeinated beverages. Alcohol and caffeine may urge you to  smoke.  · Avoid foods and drinks that can trigger a desire to smoke, such as sugary or spicy foods and alcohol.  · Ask people who smoke not to smoke around you.  · Have something planned to do right after eating or having a cup of coffee. For example, plan to take a walk or exercise.  · Try a relaxation exercise to calm you down and decrease your stress. Remember, you may be tense and nervous for the first 2 weeks after you quit, but this will pass.  · Find new activities to keep your hands busy. Play with a pen, coin, or rubber band. Doodle or draw things on paper.  · Brush your teeth right after eating. This will help cut down on the craving for the taste of tobacco after meals. You can also try mouthwash.    · Use oral substitutes in place of cigarettes. Try using lemon drops, carrots, cinnamon sticks, or chewing gum. Keep them handy so they are available when you have the urge to smoke.  · When you have the urge to smoke, try deep breathing.  · Designate your home as a nonsmoking area.  · If you are a heavy smoker, ask your health care provider about a prescription for nicotine chewing gum. It can ease your withdrawal from nicotine.  · Reward yourself. Set aside the cigarette money you save and buy yourself something nice.  · Look for   support from others. Join a support group or smoking cessation program. Ask someone at home or at work to help you with your plan to quit smoking.  · Always ask yourself, "Do I need this cigarette or is this just a reflex?" Tell yourself, "Today, I choose not to smoke," or "I do not want to smoke." You are reminding yourself of your decision to quit.  · Do not replace cigarette smoking with electronic cigarettes (commonly called e-cigarettes). The safety of e-cigarettes is unknown, and some may contain harmful chemicals.  · If you relapse, do not give up! Plan ahead and think about what you will do the next time you get the urge to smoke.  HOW WILL I FEEL WHEN I QUIT SMOKING?  You  may have symptoms of withdrawal because your body is used to nicotine (the addictive substance in cigarettes). You may crave cigarettes, be irritable, feel very hungry, cough often, get headaches, or have difficulty concentrating. The withdrawal symptoms are only temporary. They are strongest when you first quit but will go away within 10-14 days. When withdrawal symptoms occur, stay in control. Think about your reasons for quitting. Remind yourself that these are signs that your body is healing and getting used to being without cigarettes. Remember that withdrawal symptoms are easier to treat than the major diseases that smoking can cause.   Even after the withdrawal is over, expect periodic urges to smoke. However, these cravings are generally short lived and will go away whether you smoke or not. Do not smoke!  WHAT RESOURCES ARE AVAILABLE TO HELP ME QUIT SMOKING?  Your health care provider can direct you to community resources or hospitals for support, which may include:  · Group support.  · Education.  · Hypnosis.  · Therapy.  Document Released: 02/28/2004 Document Revised: 10/16/2013 Document Reviewed: 11/17/2012  [employer]® Patient Information ©2015 [employer]. This information is not intended to replace advice given to you by your health care provider. Make sure you discuss any questions you have with your health care provider.

## 2014-07-04 NOTE — Telephone Encounter (Signed)
gave pt avs report for feb thru june. central will call re ct - pt aware. flush in april coord with lab and ct.

## 2014-07-04 NOTE — Progress Notes (Signed)
[facility] [phone]) [phone]   Fax:([phone]) [phone]  OFFICE PROGRESS NOTE  Irven Shelling, MD [place] [employer], [address] [place] [address]  DIAGNOSIS:  1) Small cell carcinoma of lung, Extensive stage diagnosed in June 2015. 2) incidental diagnosis of pulmonary embolism on CT scan of the chest performed on 04/18/2014. Primary site: Lung (Right)  Staging method: AJCC 7th Edition  Clinical: Stage IV (T1a, N1, M1b) signed by Curt Bears, MD on 12/07/2013 3:19 PM  Summary: Stage IV (T1a, N1, M1b)   PRIOR THERAPY:  1)  Systemic chemotherapy with carboplatin for AUC of 5 given on day 1, etoposide 100 mg/m2 given on days 1, 2 an3 with neulast support on day 4. Status post 6 cycles.  2) Lovenox 120 mg subcutaneously started 04/18/2014. 3) status post whole brain irradiation under the care of Dr. Sondra Come completed 06/21/2014  CURRENT THERAPY:  1) observation for the small cell lung cancer. 2) Xarelto 20 mg by mouth daily. First dose of treatment on 05/03/2014.  DISEASE STAGE:  Small cell carcinoma of lung, Extensive stage  Primary site: Lung (Right)  Staging method: AJCC 7th Edition  Clinical: Stage IV (T1a, N1, M1b) signed by Curt Bears, MD on 12/07/2013 3:19 PM  Summary: Stage IV (T1a, N1, M1b)  CHEMOTHERAPY INTENT: pallative  CURRENT # OF CHEMOTHERAPY CYCLES: 0 CURRENT ANTIEMETICS: Zofran, dexamethasone and compazine  CURRENT SMOKING STATUS: Former smoker, quit 12/18/2013  ORAL CHEMOTHERAPY AND CONSENT: n/a  CURRENT BISPHOSPHONATES USE: none  PAIN MANAGEMENT: Lortab 5/325 mg  NARCOTICS INDUCED CONSTIPATION: none  LIVING WILL AND CODE STATUS: ?   INTERVAL HISTORY: Jaime Morris 54 y.o. male returns to the clinic today for followup visit. The patient completed a course of prophylactic cranial irradiation around 2 weeks ago. He still has irritation of the scalp and applying Biotene lotions. He is feeling fine today with no specific  complaints. He denied having any significant weight loss or night sweats. He has no chest pain, shortness of breath, cough or hemoptysis. He has no nausea or vomiting. No fever or chills. The patient has repeat CT scan of the chest, abdomen and pelvis performed recently and he is here for evaluation and discussion of his scan results.  MEDICAL HISTORY: Past Medical History  Diagnosis Date  . HIV infection     DR. [name]  . Hyperlipidemia   . Insomnia   . Lung nodule, multiple 10/24/13    CT CHEST W/O...DR. [name]  . Gynecomastia, male 10/16/13    MILD RIGHT SIDED PER RADIOLOGY STUDY  . COPD (chronic obstructive pulmonary disease)     PFT'S 10/26/13 @ [facility]  . History of depression   . History of pneumonia   . Small cell lung cancer   . Collapsed lung     d/t biopsy, right    ALLERGIES:  is allergic to lactose intolerance (gi).  MEDICATIONS:  Current Outpatient Prescriptions  Medication Sig Dispense Refill  . albuterol (PROAIR HFA) 108 (90 BASE) MCG/ACT inhaler Inhale 2 puffs into the lungs every 4 (four) hours as needed for wheezing or shortness of breath.     . budesonide-formoterol (SYMBICORT) 160-4.5 MCG/ACT inhaler Inhale 2 puffs into the lungs 2 (two) times daily. 3 Inhaler 2  . doxylamine, Sleep, (UNISOM) 25 MG tablet Take 50 mg by mouth at bedtime as needed for sleep.     Marland Kitchen efavirenz-emtricitabine-tenofovir (ATRIPLA) 600-200-300 MG per tablet Take 1 tablet by mouth daily. 90 tablet 3  .  emollient (BIAFINE) cream Apply topically as needed.    . fluticasone (FLONASE) 50 MCG/ACT nasal spray Place 2 sprays into both nostrils daily as needed for allergies.     Marland Kitchen HYDROcodone-acetaminophen (LORTAB) 5-325 MG per tablet Take 1-2 tablets by mouth every 6 (six) hours as needed for moderate pain. 30 tablet 0  . Melatonin 10 MG TABS Take 10 mg by mouth at bedtime as needed (sleep).    Marland Kitchen oxyCODONE-acetaminophen (PERCOCET/ROXICET) 5-325 MG per tablet Take by mouth every 4 (four)  hours as needed for severe pain.    Marland Kitchen prochlorperazine (COMPAZINE) 10 MG tablet Take 1 tablet (10 mg total) by mouth every 6 (six) hours as needed for nausea or vomiting. 30 tablet 1  . rivaroxaban (XARELTO) 20 MG TABS tablet Take 1 tablet (20 mg total) by mouth daily with supper. 90 tablet 1  . Tiotropium Bromide Monohydrate (SPIRIVA RESPIMAT) 2.5 MCG/ACT AERS Inhale 2 puffs into the lungs daily. 3 Inhaler 2  . traZODone (DESYREL) 100 MG tablet Take 100 mg by mouth at bedtime.    . varenicline (CHANTIX) 1 MG tablet Take 1 mg by mouth 2 (two) times daily.      No current facility-administered medications for this visit.   Facility-Administered Medications Ordered in Other Visits  Medication Dose Route Frequency Provider Last Rate Last Dose  . sodium chloride 0.9 % injection 10 mL  10 mL Intracatheter PRN Curt Bears, MD   10 mL at 01/30/14 1451    SURGICAL HISTORY:  Past Surgical History  Procedure Laterality Date  . Foot surgery      BUNIONECTOMY  . Back surgery      L 4-5 FUSION WITH SCREW  . Colonoscopy  UTD  . Portacath placement N/A 12/08/2013    Procedure: INSERTION PORT-A-CATH;  Surgeon: Grace Isaac, MD;  Location: [facility];  Service: Thoracic;  Laterality: N/A;    REVIEW OF SYSTEMS:  Constitutional: positive for fatigue Eyes: negative Ears, nose, mouth, throat, and face: negative Respiratory: negative Cardiovascular: negative Gastrointestinal: negative Genitourinary:negative Integument/breast: negative Hematologic/lymphatic: negative Musculoskeletal:negative Neurological: negative Behavioral/Psych: negative Endocrine: negative Allergic/Immunologic: negative   PHYSICAL EXAMINATION: General appearance: alert, cooperative, fatigued and no distress Head: Normocephalic, without obvious abnormality, atraumatic Neck: no adenopathy, no JVD, supple, symmetrical, trachea midline and thyroid not enlarged, symmetric, no tenderness/mass/nodules Lymph nodes: Cervical,  supraclavicular, and axillary nodes normal. Resp: clear to auscultation bilaterally Back: symmetric, no curvature. ROM normal. No CVA tenderness. Cardio: regular rate and rhythm, S1, S2 normal, no murmur, click, rub or gallop GI: soft, non-tender; bowel sounds normal; no masses,  no organomegaly Extremities: extremities normal, atraumatic, no cyanosis or edema Neurologic: Alert and oriented X 3, normal strength and tone. Normal symmetric reflexes. Normal coordination and gait  ECOG PERFORMANCE STATUS: 1 - Symptomatic but completely ambulatory  Blood pressure 138/80, pulse 93, temperature 98.4 F (36.9 C), temperature source Oral, resp. rate 18, height 5\' 10"  (1.778 m), weight 190 lb 6.4 oz (86.365 kg), SpO2 96 %.  LABORATORY DATA: Lab Results  Component Value Date   WBC 6.5 06/27/2014   HGB 15.6 06/27/2014   HCT 45.3 06/27/2014   MCV 93.6 06/27/2014   PLT 187 06/27/2014      Chemistry      Component Value Date/Time   NA 137 06/27/2014 1316   NA 134* 01/22/2014 0955   K 4.1 06/27/2014 1316   K 4.2 01/22/2014 0955   CL 102 01/22/2014 0955   CO2 23 06/27/2014 1316   CO2 23  01/22/2014 0955   BUN 6.5* 06/27/2014 1316   BUN 12 01/22/2014 0955   CREATININE 0.8 06/27/2014 1316   CREATININE 0.97 01/22/2014 0955   CREATININE 0.79 10/23/2013 1112      Component Value Date/Time   CALCIUM 9.0 06/27/2014 1316   CALCIUM 9.6 01/22/2014 0955   ALKPHOS 134 06/27/2014 1316   ALKPHOS 138* 01/22/2014 0955   AST 45* 06/27/2014 1316   AST 23 01/22/2014 0955   ALT 50 06/27/2014 1316   ALT 28 01/22/2014 0955   BILITOT 0.28 06/27/2014 1316   BILITOT 0.5 01/22/2014 0955       RADIOGRAPHIC STUDIES: Ct Chest W Contrast  06/27/2014   CLINICAL DATA:  Subsequent encounter for lung cancer restaging  EXAM: CT CHEST, ABDOMEN, AND PELVIS WITH CONTRAST  TECHNIQUE: Multidetector CT imaging of the chest, abdomen and pelvis was performed following the standard protocol during bolus administration of  intravenous contrast.  CONTRAST:  129mL OMNIPAQUE IOHEXOL 300 MG/ML  SOLN  COMPARISON:  04/18/2014  FINDINGS: CT CHEST FINDINGS  Mediastinum/Nodes: No axillary lymph nodes. No mediastinal or hilar lymphadenopathy. Heart size is normal no pericardial effusion. The tip of the patient's left-sided Port-A-Cath is at the mid SVC level.  Lungs/Pleura: Centrilobular and paraseptal emphysema is seen with upper lobe predominance. The wedge-shaped area of consolidation/ atelectasis seen in the right middle lobe on the previous study has decreased substantially in the interval although it is not yet completely resolved. Scattered tiny nodules in the right middle and lower lobes as well as the left upper lobe described on the previous study are stable. No new or progressing pulmonary nodules are evident.  Musculoskeletal: Bone windows reveal no worrisome lytic or sclerotic osseous lesions.  CT ABDOMEN AND PELVIS FINDINGS  Hepatobiliary: No focal abnormality within the liver parenchyma. There is no evidence for gallstones, gallbladder wall thickening, or pericholecystic fluid. No intrahepatic or extrahepatic biliary dilation.  Pancreas: No focal mass lesion. No dilatation of the main duct. No intraparenchymal cyst. No peripancreatic edema.  Spleen: No splenomegaly. No focal mass lesion.  Adrenals/Urinary Tract: No adrenal nodule or mass. Kidneys are unremarkable, without hydronephrosis or enhancing lesion. No evidence for hydroureter. Urinary bladder has normal CT imaging features.  Stomach/Bowel: Stomach is nondistended. No gastric wall thickening. No evidence of outlet obstruction. Duodenum is normally positioned as is the ligament of Treitz. The terminal ileum is normal. The appendix is normal. Diverticular changes are noted in the left colon without evidence of diverticulitis.  Vascular/Lymphatic: Atherosclerotic calcification is noted in the wall of the abdominal aorta without aneurysm. No gastrohepatic or hepatoduodenal  ligament lymphadenopathy. No retroperitoneal lymphadenopathy. There is no pelvic sidewall lymphadenopathy.  Reproductive: Prostate gland is enlarged. Seminal vesicles are unremarkable.  Other: No intraperitoneal free fluid.  Musculoskeletal: Bone windows reveal no worrisome lytic or sclerotic osseous lesions. Patient is status post lower lumbar fusion.  IMPRESSION: 1. Interval near complete resolution of the right middle lobe collapse/consolidation. 2. Stable appearance of scattered tiny bilateral pulmonary nodules. No new or progressive pulmonary nodule. 3. No evidence for new or progressive findings to suggest metastatic disease.   Electronically Signed   By: Misty Stanley M.D.   On: 06/27/2014 16:07   Ct Abdomen Pelvis W Contrast  06/27/2014   CLINICAL DATA:  Subsequent encounter for lung cancer restaging  EXAM: CT CHEST, ABDOMEN, AND PELVIS WITH CONTRAST  TECHNIQUE: Multidetector CT imaging of the chest, abdomen and pelvis was performed following the standard protocol during bolus administration of intravenous contrast.  CONTRAST:  162mL OMNIPAQUE IOHEXOL 300 MG/ML  SOLN  COMPARISON:  04/18/2014  FINDINGS: CT CHEST FINDINGS  Mediastinum/Nodes: No axillary lymph nodes. No mediastinal or hilar lymphadenopathy. Heart size is normal no pericardial effusion. The tip of the patient's left-sided Port-A-Cath is at the mid SVC level.  Lungs/Pleura: Centrilobular and paraseptal emphysema is seen with upper lobe predominance. The wedge-shaped area of consolidation/ atelectasis seen in the right middle lobe on the previous study has decreased substantially in the interval although it is not yet completely resolved. Scattered tiny nodules in the right middle and lower lobes as well as the left upper lobe described on the previous study are stable. No new or progressing pulmonary nodules are evident.  Musculoskeletal: Bone windows reveal no worrisome lytic or sclerotic osseous lesions.  CT ABDOMEN AND PELVIS FINDINGS   Hepatobiliary: No focal abnormality within the liver parenchyma. There is no evidence for gallstones, gallbladder wall thickening, or pericholecystic fluid. No intrahepatic or extrahepatic biliary dilation.  Pancreas: No focal mass lesion. No dilatation of the main duct. No intraparenchymal cyst. No peripancreatic edema.  Spleen: No splenomegaly. No focal mass lesion.  Adrenals/Urinary Tract: No adrenal nodule or mass. Kidneys are unremarkable, without hydronephrosis or enhancing lesion. No evidence for hydroureter. Urinary bladder has normal CT imaging features.  Stomach/Bowel: Stomach is nondistended. No gastric wall thickening. No evidence of outlet obstruction. Duodenum is normally positioned as is the ligament of Treitz. The terminal ileum is normal. The appendix is normal. Diverticular changes are noted in the left colon without evidence of diverticulitis.  Vascular/Lymphatic: Atherosclerotic calcification is noted in the wall of the abdominal aorta without aneurysm. No gastrohepatic or hepatoduodenal ligament lymphadenopathy. No retroperitoneal lymphadenopathy. There is no pelvic sidewall lymphadenopathy.  Reproductive: Prostate gland is enlarged. Seminal vesicles are unremarkable.  Other: No intraperitoneal free fluid.  Musculoskeletal: Bone windows reveal no worrisome lytic or sclerotic osseous lesions. Patient is status post lower lumbar fusion.  IMPRESSION: 1. Interval near complete resolution of the right middle lobe collapse/consolidation. 2. Stable appearance of scattered tiny bilateral pulmonary nodules. No new or progressive pulmonary nodule. 3. No evidence for new or progressive findings to suggest metastatic disease.   Electronically Signed   By: Misty Stanley M.D.   On: 06/27/2014 16:07   ASSESSMENT AND PLAN:  This is a very pleasant 54 years old [place] male recently diagnosed with:  1) Extensive stage small cell lung cancer: He is status post 6 cycles of systemic chemotherapy with  carboplatin and etoposide. This was followed by prophylactic cranial irradiation.  His recent CT scan of the chest, abdomen and pelvis showed no new or progressive pulmonary nodules and no findings suggestive of progressive disease. I discussed the scan results with the patient.  I recommended for him to continue on observation with repeat CT scan of the chest, abdomen and pelvis in 3 months for reevaluation of his disease.   2) right lower lobe pulmonary embolus: He will continue on Xarelto 20 mg by mouth daily. He will also continue with Port-A-Cath flush every 6 weeks.  He was advised to call immediately if he has any concerning symptoms in the interval. The patient voices understanding of current disease status and treatment options and is in agreement with the current care plan.  All questions were answered. The patient knows to call the clinic with any problems, questions or concerns. We can certainly see the patient much sooner if necessary.  Disclaimer: This note was dictated with voice recognition software. Similar sounding words can  inadvertently be transcribed and may not be corrected upon review.       

## 2014-07-10 ENCOUNTER — Encounter: Payer: Self-pay | Admitting: Radiation Oncology

## 2014-07-10 NOTE — Progress Notes (Signed)
  Radiation Oncology         [phone] ________________________________  Name: Jaime Morris MRN: [id number]  Date: 07/10/2014  DOB: [birth year]  End of Treatment Note   DIAGNOSIS: Extensive stage small cell lung cancer for prophylactic cranial irradiation  Indication for treatment:  Prophylactic cranial irradiation       Radiation treatment dates:   December 22 through January 6  Site/dose:   Whole brain 25 gray in 10 fractions  Beams/energy:   Lateral fields, 6X photons  Narrative: The patient tolerated radiation treatment relatively well.   He had some mild nausea and mild headaches did not wish to start steroids. also had some mild scalp dryness and irritation and was given Biafine for this issue  Plan: The patient has completed radiation treatment. The patient will return to radiation oncology clinic for routine followup in one month. I advised them to call or return sooner if they have any questions or concerns related to their recovery or treatment.  -----------------------------------  Blair Promise, PhD, MD

## 2014-07-13 ENCOUNTER — Encounter (HOSPITAL_COMMUNITY)
Admission: RE | Admit: 2014-07-13 | Discharge: 2014-07-13 | Disposition: A | Discharge status: Home / Self Care | Payer: Self-pay | Source: Ambulatory Visit | Attending: Pulmonary Disease | Admitting: Pulmonary Disease

## 2014-07-16 ENCOUNTER — Encounter (HOSPITAL_COMMUNITY): Payer: Self-pay

## 2014-07-16 DIAGNOSIS — C3490 Malignant neoplasm of unspecified part of unspecified bronchus or lung: Secondary | ICD-10-CM | POA: Insufficient documentation

## 2014-07-18 ENCOUNTER — Encounter: Payer: Self-pay | Admitting: Oncology

## 2014-07-18 ENCOUNTER — Encounter (HOSPITAL_COMMUNITY): Payer: Self-pay

## 2014-07-19 ENCOUNTER — Encounter: Payer: Self-pay | Admitting: Radiation Oncology

## 2014-07-19 ENCOUNTER — Ambulatory Visit
Admission: RE | Admit: 2014-07-19 | Discharge: 2014-07-19 | Disposition: A | Discharge status: Home / Self Care | Payer: 59 | Source: Ambulatory Visit | Attending: Radiation Oncology | Admitting: Radiation Oncology

## 2014-07-19 VITALS — BP 141/91 | HR 92 | Temp 98.9°F | Resp 20 | Ht 70.0 in | Wt 185.6 lb

## 2014-07-19 DIAGNOSIS — C3490 Malignant neoplasm of unspecified part of unspecified bronchus or lung: Secondary | ICD-10-CM

## 2014-07-19 NOTE — Progress Notes (Signed)
Radiation Oncology         [phone]) [phone] ________________________________  Name: Jaime Morris MRN: [id number]  Date: 07/19/2014  DOB: [birth year]  Follow-Up Visit Note  CC: Irven Shelling, MD  Curt Bears, MD    ICD-9-CM ICD-10-CM   1. Small cell carcinoma of lung, unspecified laterality 162.9 C34.90     Diagnosis:  Extensive stage small cell lung cancer  Interval Since Last Radiation:  1  months  Narrative:  The patient returns today for routine follow-up.  He continues to have some fatigue since his prophylactic cranial irradiation. He has some nausea issues early in the morning but none throughout the day. He reports occasional headaches but these are lessening in frequency. Patient denies any balance problems or difficulty with walking. Patient does notice some mild memory issues. He reports being down. No suicidal tendencies.  he does have a prior history of depression and has medication for this issue.                              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  Meds: Current Outpatient Prescriptions  Medication Sig Dispense Refill  . albuterol (PROAIR HFA) 108 (90 BASE) MCG/ACT inhaler Inhale 2 puffs into the lungs every 4 (four) hours as needed for wheezing or shortness of breath.     . budesonide-formoterol (SYMBICORT) 160-4.5 MCG/ACT inhaler Inhale 2 puffs into the lungs 2 (two) times daily. 3 Inhaler 2  . doxylamine, Sleep, (UNISOM) 25 MG tablet Take 50 mg by mouth at bedtime as needed for sleep.     Marland Kitchen efavirenz-emtricitabine-tenofovir (ATRIPLA) 600-200-300 MG per tablet Take 1 tablet by mouth daily. 90 tablet 3  . emollient (BIAFINE) cream Apply topically as needed.    . fluticasone (FLONASE) 50 MCG/ACT nasal spray Place 2 sprays into both nostrils daily as needed for allergies.     . Melatonin 10 MG TABS Take 10 mg by mouth at bedtime as needed (sleep).    . prochlorperazine (COMPAZINE) 10 MG tablet Take 1 tablet (10 mg total) by mouth  every 6 (six) hours as needed for nausea or vomiting. 30 tablet 1  . rivaroxaban (XARELTO) 20 MG TABS tablet Take 1 tablet (20 mg total) by mouth daily with supper. 90 tablet 1  . Tiotropium Bromide Monohydrate (SPIRIVA RESPIMAT) 2.5 MCG/ACT AERS Inhale 2 puffs into the lungs daily. 3 Inhaler 2  . traZODone (DESYREL) 100 MG tablet Take 100 mg by mouth at bedtime.    . varenicline (CHANTIX) 1 MG tablet Take 1 mg by mouth 2 (two) times daily.     Marland Kitchen HYDROcodone-acetaminophen (LORTAB) 5-325 MG per tablet Take 1-2 tablets by mouth every 6 (six) hours as needed for moderate pain. (Patient not taking: Reported on 07/19/2014) 30 tablet 0  . oxyCODONE-acetaminophen (PERCOCET/ROXICET) 5-325 MG per tablet Take by mouth every 4 (four) hours as needed for severe pain.     No current facility-administered medications for this encounter.   Facility-Administered Medications Ordered in Other Encounters  Medication Dose Route Frequency Provider Last Rate Last Dose  . sodium chloride 0.9 % injection 10 mL  10 mL Intracatheter PRN Curt Bears, MD   10 mL at 01/30/14 1451    Physical Findings: The patient is in no acute distress. Patient is alert and oriented.  height is 5\' 10"  (1.778 m) and weight is 185 lb 9.6 oz (84.188 kg). His oral temperature is 98.9 F (37.2 C).  His blood pressure is 141/91 and his pulse is 92. His respiration is 20 and oxygen saturation is 96%. . No palpable subclavicular or axillary adenopathy. The lungs are clear to auscultation. The heart has regular rhythm and rate. The neurological examination is nonfocal. The oral cavity is moist without secondary infection. The scalp area reveals some hyperpigmentation changes but no skin breakdown.  Lab Findings: Lab Results  Component Value Date   WBC 6.5 06/27/2014   HGB 15.6 06/27/2014   HCT 45.3 06/27/2014   MCV 93.6 06/27/2014   PLT 187 06/27/2014    Radiographic Findings: Ct Chest W Contrast  06/27/2014   CLINICAL DATA:  Subsequent  encounter for lung cancer restaging  EXAM: CT CHEST, ABDOMEN, AND PELVIS WITH CONTRAST  TECHNIQUE: Multidetector CT imaging of the chest, abdomen and pelvis was performed following the standard protocol during bolus administration of intravenous contrast.  CONTRAST:  198mL OMNIPAQUE IOHEXOL 300 MG/ML  SOLN  COMPARISON:  04/18/2014  FINDINGS: CT CHEST FINDINGS  Mediastinum/Nodes: No axillary lymph nodes. No mediastinal or hilar lymphadenopathy. Heart size is normal no pericardial effusion. The tip of the patient's left-sided Port-A-Cath is at the mid SVC level.  Lungs/Pleura: Centrilobular and paraseptal emphysema is seen with upper lobe predominance. The wedge-shaped area of consolidation/ atelectasis seen in the right middle lobe on the previous study has decreased substantially in the interval although it is not yet completely resolved. Scattered tiny nodules in the right middle and lower lobes as well as the left upper lobe described on the previous study are stable. No new or progressing pulmonary nodules are evident.  Musculoskeletal: Bone windows reveal no worrisome lytic or sclerotic osseous lesions.  CT ABDOMEN AND PELVIS FINDINGS  Hepatobiliary: No focal abnormality within the liver parenchyma. There is no evidence for gallstones, gallbladder wall thickening, or pericholecystic fluid. No intrahepatic or extrahepatic biliary dilation.  Pancreas: No focal mass lesion. No dilatation of the main duct. No intraparenchymal cyst. No peripancreatic edema.  Spleen: No splenomegaly. No focal mass lesion.  Adrenals/Urinary Tract: No adrenal nodule or mass. Kidneys are unremarkable, without hydronephrosis or enhancing lesion. No evidence for hydroureter. Urinary bladder has normal CT imaging features.  Stomach/Bowel: Stomach is nondistended. No gastric wall thickening. No evidence of outlet obstruction. Duodenum is normally positioned as is the ligament of Treitz. The terminal ileum is normal. The appendix is normal.  Diverticular changes are noted in the left colon without evidence of diverticulitis.  Vascular/Lymphatic: Atherosclerotic calcification is noted in the wall of the abdominal aorta without aneurysm. No gastrohepatic or hepatoduodenal ligament lymphadenopathy. No retroperitoneal lymphadenopathy. There is no pelvic sidewall lymphadenopathy.  Reproductive: Prostate gland is enlarged. Seminal vesicles are unremarkable.  Other: No intraperitoneal free fluid.  Musculoskeletal: Bone windows reveal no worrisome lytic or sclerotic osseous lesions. Patient is status post lower lumbar fusion.  IMPRESSION: 1. Interval near complete resolution of the right middle lobe collapse/consolidation. 2. Stable appearance of scattered tiny bilateral pulmonary nodules. No new or progressive pulmonary nodule. 3. No evidence for new or progressive findings to suggest metastatic disease.   Electronically Signed   By: Misty Stanley M.D.   On: 06/27/2014 16:07   Ct Abdomen Pelvis W Contrast  06/27/2014   CLINICAL DATA:  Subsequent encounter for lung cancer restaging  EXAM: CT CHEST, ABDOMEN, AND PELVIS WITH CONTRAST  TECHNIQUE: Multidetector CT imaging of the chest, abdomen and pelvis was performed following the standard protocol during bolus administration of intravenous contrast.  CONTRAST:  140mL OMNIPAQUE IOHEXOL 300 MG/ML  SOLN  COMPARISON:  04/18/2014  FINDINGS: CT CHEST FINDINGS  Mediastinum/Nodes: No axillary lymph nodes. No mediastinal or hilar lymphadenopathy. Heart size is normal no pericardial effusion. The tip of the patient's left-sided Port-A-Cath is at the mid SVC level.  Lungs/Pleura: Centrilobular and paraseptal emphysema is seen with upper lobe predominance. The wedge-shaped area of consolidation/ atelectasis seen in the right middle lobe on the previous study has decreased substantially in the interval although it is not yet completely resolved. Scattered tiny nodules in the right middle and lower lobes as well as the left  upper lobe described on the previous study are stable. No new or progressing pulmonary nodules are evident.  Musculoskeletal: Bone windows reveal no worrisome lytic or sclerotic osseous lesions.  CT ABDOMEN AND PELVIS FINDINGS  Hepatobiliary: No focal abnormality within the liver parenchyma. There is no evidence for gallstones, gallbladder wall thickening, or pericholecystic fluid. No intrahepatic or extrahepatic biliary dilation.  Pancreas: No focal mass lesion. No dilatation of the main duct. No intraparenchymal cyst. No peripancreatic edema.  Spleen: No splenomegaly. No focal mass lesion.  Adrenals/Urinary Tract: No adrenal nodule or mass. Kidneys are unremarkable, without hydronephrosis or enhancing lesion. No evidence for hydroureter. Urinary bladder has normal CT imaging features.  Stomach/Bowel: Stomach is nondistended. No gastric wall thickening. No evidence of outlet obstruction. Duodenum is normally positioned as is the ligament of Treitz. The terminal ileum is normal. The appendix is normal. Diverticular changes are noted in the left colon without evidence of diverticulitis.  Vascular/Lymphatic: Atherosclerotic calcification is noted in the wall of the abdominal aorta without aneurysm. No gastrohepatic or hepatoduodenal ligament lymphadenopathy. No retroperitoneal lymphadenopathy. There is no pelvic sidewall lymphadenopathy.  Reproductive: Prostate gland is enlarged. Seminal vesicles are unremarkable.  Other: No intraperitoneal free fluid.  Musculoskeletal: Bone windows reveal no worrisome lytic or sclerotic osseous lesions. Patient is status post lower lumbar fusion.  IMPRESSION: 1. Interval near complete resolution of the right middle lobe collapse/consolidation. 2. Stable appearance of scattered tiny bilateral pulmonary nodules. No new or progressive pulmonary nodule. 3. No evidence for new or progressive findings to suggest metastatic disease.   Electronically Signed   By: Misty Stanley M.D.   On:  06/27/2014 16:07    Impression:  The patient is recovering from the effects of radiation. Recent imaging shows no systemic progression   Plan:  Routine follow-up in 3 months. In the interim the patient will be seen in medical oncology  ____________________________________ Blair Promise, MD

## 2014-07-19 NOTE — Progress Notes (Addendum)
Jaime Morris here for follow up after prophylactic cranial irradiation.  He denies pain.  He does report occasional headaches.  He reports they are getting better.  He reports having trouble reading.  He denies balance issues.  He reports having nausea with vomiting usually in the mornings.  He does have compazine if he needs it.  He reports a poor appetite and has lost 5 lbs since 07/04/14.  He has noticed some memory issues with walking into a room and forgetting why he went in.  He is not taking decadron.  The skin on his scalp and forehead has hyperpigmentation.  He has lost his hair.  He is using biafine cream.  He reports feeling down and fatigued.  BP 141/91 mmHg  Pulse 92  Temp(Src) 98.9 F (37.2 C) (Oral)  Resp 20  Ht 5\' 10"  (1.778 m)  Wt 185 lb 9.6 oz (84.188 kg)  BMI 26.63 kg/m2  SpO2 96%

## 2014-07-20 ENCOUNTER — Encounter (HOSPITAL_COMMUNITY): Payer: Self-pay

## 2014-07-23 ENCOUNTER — Encounter (HOSPITAL_COMMUNITY): Payer: Self-pay

## 2014-07-25 ENCOUNTER — Encounter (HOSPITAL_COMMUNITY): Payer: Self-pay

## 2014-07-27 ENCOUNTER — Encounter (HOSPITAL_COMMUNITY): Payer: Self-pay

## 2014-07-30 ENCOUNTER — Encounter (HOSPITAL_COMMUNITY): Admission: RE | Admit: 2014-07-30 | Payer: Self-pay | Source: Ambulatory Visit

## 2014-08-01 ENCOUNTER — Encounter (HOSPITAL_COMMUNITY): Payer: Self-pay

## 2014-08-03 ENCOUNTER — Encounter (HOSPITAL_COMMUNITY): Payer: Self-pay

## 2014-08-06 ENCOUNTER — Encounter (HOSPITAL_COMMUNITY): Payer: Self-pay

## 2014-08-08 ENCOUNTER — Encounter (HOSPITAL_COMMUNITY): Payer: Self-pay

## 2014-08-08 ENCOUNTER — Ambulatory Visit (HOSPITAL_BASED_OUTPATIENT_CLINIC_OR_DEPARTMENT_OTHER): Payer: 59

## 2014-08-08 VITALS — BP 151/91 | HR 88 | Temp 98.3°F

## 2014-08-08 DIAGNOSIS — C3411 Malignant neoplasm of upper lobe, right bronchus or lung: Secondary | ICD-10-CM

## 2014-08-08 DIAGNOSIS — Z95828 Presence of other vascular implants and grafts: Secondary | ICD-10-CM

## 2014-08-08 DIAGNOSIS — Z452 Encounter for adjustment and management of vascular access device: Secondary | ICD-10-CM

## 2014-08-08 MED ORDER — SODIUM CHLORIDE 0.9 % IJ SOLN
10.0000 mL | INTRAMUSCULAR | Status: DC | PRN
  Administered 2014-08-08: 10 mL via INTRAVENOUS
  Filled 2014-08-08: qty 10

## 2014-08-08 MED ORDER — HEPARIN SOD (PORK) LOCK FLUSH 100 UNIT/ML IV SOLN
500.0000 [IU] | Freq: Once | INTRAVENOUS | Status: AC
  Administered 2014-08-08: 500 [IU] via INTRAVENOUS
  Filled 2014-08-08: qty 5

## 2014-08-08 NOTE — Patient Instructions (Signed)

## 2014-08-10 ENCOUNTER — Encounter (HOSPITAL_COMMUNITY)
Admission: RE | Admit: 2014-08-10 | Discharge: 2014-08-10 | Disposition: A | Discharge status: Home / Self Care | Payer: Self-pay | Source: Ambulatory Visit | Attending: Pulmonary Disease | Admitting: Pulmonary Disease

## 2014-08-13 ENCOUNTER — Encounter: Payer: Self-pay | Admitting: Oncology

## 2014-08-13 ENCOUNTER — Encounter (HOSPITAL_COMMUNITY)
Admission: RE | Admit: 2014-08-13 | Discharge: 2014-08-13 | Disposition: A | Discharge status: Home / Self Care | Payer: Self-pay | Source: Ambulatory Visit | Attending: Pulmonary Disease | Admitting: Pulmonary Disease

## 2014-08-13 ENCOUNTER — Encounter: Payer: Self-pay | Admitting: Adult Health

## 2014-08-13 ENCOUNTER — Ambulatory Visit (INDEPENDENT_AMBULATORY_CARE_PROVIDER_SITE_OTHER): Payer: 59 | Admitting: Adult Health

## 2014-08-13 VITALS — BP 140/80 | HR 105 | Temp 98.3°F | Ht 73.0 in | Wt 180.6 lb

## 2014-08-13 DIAGNOSIS — C3490 Malignant neoplasm of unspecified part of unspecified bronchus or lung: Secondary | ICD-10-CM

## 2014-08-13 DIAGNOSIS — I2699 Other pulmonary embolism without acute cor pulmonale: Secondary | ICD-10-CM

## 2014-08-13 DIAGNOSIS — J439 Emphysema, unspecified: Secondary | ICD-10-CM

## 2014-08-13 NOTE — Patient Instructions (Signed)
Continue on Spiriva daily .  May stop Symbicort .  follow up with Oncology and CT in April as planned  Please contact office for sooner follow up if symptoms do not improve or worsen or seek emergency care  follow up Dr. Elsworth Soho  In 4 months and  As needed

## 2014-08-13 NOTE — Progress Notes (Signed)
Stephan Minister came to the clinic to schedule an appointment with Dr. Sondra Come.  He reports having bumps on his forehead and behind his ears.  He reports that they are itching and painful behind his right ear when he lays on that side.  He reports they appeared 3 weeks ago.  He would like to see Dr. Sondra Come on Wednesday.  Advised him to stop using biafine and to apply hydrocortisone 1% cream twice a daily to the areas with bumps.  [place] verbalized agreement. He has been given an appointment for Wednesday, 08/15/14 at 9:30.

## 2014-08-13 NOTE — Assessment & Plan Note (Signed)
Cont follow up with ONcology as planned  Chemo and prophylactive cranial XRT completeed Follow up CT as planned per Oncology

## 2014-08-13 NOTE — Assessment & Plan Note (Signed)
PE dx 11/205  Cont on Xarelto  follow up Dr. Elsworth Soho  In 4 month

## 2014-08-13 NOTE — Progress Notes (Signed)
   Subjective:    Patient ID: Jaime Morris, male    DOB: Jan 10, 1961, 54 y.o.   MRN: [id number]  HPI  53 year old smoker , HIV positive and small cell lung cancer presents for evaluation of dyspnea. He smoked about a pack per day for 40 years-about 40 pack years, until he quit in 03/2014. He was diagnosed with HIV in 1995, last CD4 count is more than 900, viral load undetectable. Screening CT chest in 10/2013 showed an 1914 mm lobulated pleural-based nodule in the right upper lobe with other scattered subcentimeter nodules, on biopsy this turned out to be small cell lung cancer. He underwent chemotherapy with carboplatin and etoposide 6 cycles, and prophylactic brain radiation is planned. CT angiogram on 04/18/14 showed small filling defect in the segmental branch of the right lower lobe pulmonary artery. There was a wedge-shaped area of consolidation in the right middle lobe-new. Other small nodules were stable. He was treated with Lovenox for pulmonary embolism, and then placed on rivaroxaban. PFTs 10/2013 showed FEV1 of 98%, TLC 110%, DLCO 65%  Reports dyspnea on exertion class II for the past 3-4 months. He denies wheezing or frequent chest colds. He denies cough or sputum production. 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   08/13/2014 Follow up: COPD/PE /Small cell CA lung /HIV-antivirals  Patient returns for three-month follow-up. Since last visit. Patient states that his breathing overall has been improved with decreased shortness of breath. Has some dry cough on and off. He has small cell lung cancer. Followed by oncology with Dr. Earlie Server Has finished chemotherapy and prophylactic cranial irradiation. Most recent CT chest /abd/pelvis in Jan no evidence of dz progression and interval near resolution of RML collapse.  Says since Brain XRT has irritated rash along scalp and ears.  Was started on Symbicort and Spiriva. Last visit for shortness of  breath. PFTs showed minimal airflow obstruction with FEV1 of 98%, ratio of 63- 2015. Says that his breathing is improved with decreased shortness of breath. Unclear if Symbicort or Spiriva have helped. He remains on Xarelto for previous PE found in November 2015. She denies any hemoptysis, chest pain, orthopnea, PND or leg swelling       Review of Systems  Constitutional: Negative for fever and unexpected weight change.  HENT: Negative for congestion, dental problem, ear pain, nosebleeds, postnasal drip, rhinorrhea, sinus pressure, sneezing, sore throat and trouble swallowing.   Eyes: Negative for redness and itching.  Respiratory: Positive for cough Negative for chest tightness and wheezing.   Cardiovascular: Negative for palpitations and leg swelling.  Gastrointestinal: Negative for nausea and vomiting.  Genitourinary: Negative for dysuria.  Musculoskeletal: Negative for joint swelling.  Skin: Negative for rash.  Neurological: Negative for headaches.  Hematological: Does not bruise/bleed easily.  Psychiatric/Behavioral: Negative for dysphoric mood. The patient is not nervous/anxious.        Objective:   Physical Exam  Gen. Pleasant, thin, in no distress, normal affect ENT - no lesions, no post nasal drip Neck: No JVD, no thyromegaly, no carotid bruits Lungs: no use of accessory muscles, no dullness to percussion, clear without rales or rhonchi  Cardiovascular: Rhythm regular, heart sounds  normal, no murmurs or gallops, no peripheral edema Abdomen: soft and non-tender, no hepatosplenomegaly, BS normal. Musculoskeletal: No deformities, no cyanosis or clubbing Neuro:  alert, non focal Skin: bumpy rash along ears/scalp       Assessment & Plan:

## 2014-08-13 NOTE — Assessment & Plan Note (Signed)
Minimal COPD despite smoking  Advised on smoking cessation  Will stop Symbicort for now as no perceived benefit by pt.  and increased risk of steroid side effect with anitviral use.  Pt education on steroids/antiviral use - advised smoking cessation is major goal  Cont on Spiriva  follow up Dr. Elsworth Soho  In 4 months

## 2014-08-13 NOTE — Addendum Note (Signed)
Addended by: Parke Poisson E on: 08/13/2014 11:32 AM   Modules accepted: Orders, Medications

## 2014-08-15 ENCOUNTER — Encounter (HOSPITAL_COMMUNITY)
Admission: RE | Admit: 2014-08-15 | Discharge: 2014-08-15 | Disposition: A | Discharge status: Home / Self Care | Payer: Self-pay | Source: Ambulatory Visit | Attending: Pulmonary Disease | Admitting: Pulmonary Disease

## 2014-08-15 ENCOUNTER — Ambulatory Visit
Admission: RE | Admit: 2014-08-15 | Discharge: 2014-08-15 | Disposition: A | Discharge status: Home / Self Care | Payer: 59 | Source: Ambulatory Visit | Attending: Radiation Oncology | Admitting: Radiation Oncology

## 2014-08-15 VITALS — BP 139/89 | HR 86 | Temp 98.1°F | Wt 180.0 lb

## 2014-08-15 DIAGNOSIS — C3490 Malignant neoplasm of unspecified part of unspecified bronchus or lung: Secondary | ICD-10-CM | POA: Insufficient documentation

## 2014-08-15 NOTE — Progress Notes (Signed)
Radiation Oncology         [phone]) [phone] ________________________________  Name: Jaime Morris MRN: [id number]  Date: 08/15/2014  DOB: [birth year]  Follow-Up Visit Note  CC: Irven Shelling, MD  Irven Shelling, MD    ICD-9-CM ICD-10-CM   1. Small cell carcinoma of lung, unspecified laterality 162.9 C34.90     Diagnosis:  Extensive stage small cell lung cancer  Interval Since Last Radiation:  2  months, the patient completed prophylactic cranial irradiation , 25 gray  Narrative:  The patient returns today for close follow-up. The patient developed a rash along the scalp area and is requesting follow-up for this issue. Patient is  some itching particularly behind the ears as well as a papular rash. He denies any obvious drainage from the area.  Patient was seen by nursing staff earlier in the week and was recommended he use hydrocortisone cream which is our he started helping this issue.   His appetite is improving. He denies any headaches or nausea at this time.                          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  Meds: Current Outpatient Prescriptions  Medication Sig Dispense Refill  . albuterol (PROAIR HFA) 108 (90 BASE) MCG/ACT inhaler Inhale 2 puffs into the lungs every 4 (four) hours as needed for wheezing or shortness of breath.     . doxylamine, Sleep, (UNISOM) 25 MG tablet Take 50 mg by mouth at bedtime as needed for sleep.     Marland Kitchen efavirenz-emtricitabine-tenofovir (ATRIPLA) 600-200-300 MG per tablet Take 1 tablet by mouth daily. 90 tablet 3  . emollient (BIAFINE) cream Apply topically as needed.    Marland Kitchen HYDROcodone-acetaminophen (LORTAB) 5-325 MG per tablet Take 1-2 tablets by mouth every 6 (six) hours as needed for moderate pain. 30 tablet 0  . Melatonin 10 MG TABS Take 10 mg by mouth at bedtime as needed (sleep).    . prochlorperazine (COMPAZINE) 10 MG tablet Take 1 tablet (10 mg total) by mouth every 6 (six) hours as needed for nausea or  vomiting. 30 tablet 1  . rivaroxaban (XARELTO) 20 MG TABS tablet Take 1 tablet (20 mg total) by mouth daily with supper. 90 tablet 1  . Tiotropium Bromide Monohydrate (SPIRIVA RESPIMAT) 2.5 MCG/ACT AERS Inhale 2 puffs into the lungs daily. 3 Inhaler 2  . traZODone (DESYREL) 100 MG tablet Take 100 mg by mouth at bedtime.    . vitamin B-12 (CYANOCOBALAMIN) 500 MCG tablet Take 500 mcg by mouth daily.    . fluticasone (FLONASE) 50 MCG/ACT nasal spray Place 2 sprays into both nostrils daily as needed for allergies.     Marland Kitchen oxyCODONE-acetaminophen (PERCOCET/ROXICET) 5-325 MG per tablet Take by mouth every 4 (four) hours as needed for severe pain.    . varenicline (CHANTIX) 1 MG tablet Take 1 mg by mouth 2 (two) times daily.      No current facility-administered medications for this encounter.   Facility-Administered Medications Ordered in Other Encounters  Medication Dose Route Frequency Provider Last Rate Last Dose  . sodium chloride 0.9 % injection 10 mL  10 mL Intracatheter PRN Curt Bears, MD   10 mL at 01/30/14 1451    Physical Findings: The patient is in no acute distress. Patient is alert and oriented.  weight is 180 lb (81.647 kg). His temperature is 98.1 F (36.7 C). His blood pressure is 139/89 and his pulse  is 86. .  The scalp area shows hyperpigmentation changes. No skin breakdown is appreciated. The patient has a papular rash along the postauricular bilaterally and some along the earlobes.  Lab Findings: Lab Results  Component Value Date   WBC 6.5 06/27/2014   HGB 15.6 06/27/2014   HCT 45.3 06/27/2014   MCV 93.6 06/27/2014   PLT 187 06/27/2014    Radiographic Findings: No results found.  Impression:  The patient is recovering from the effects of radiation.  Papular rash presumably radiation dermatitis. I recommend the patient continue using hydrocortisone cream since this seems to be helping the situation. Patient will continue using this cream along affected areas. He will  call back if not improved and which case we will refer to dermatology.  Otherwise when necessary follow-up in radiation oncology in light of his close follow-up with medical oncology.    ____________________________________ Blair Promise, MD

## 2014-08-15 NOTE — Progress Notes (Signed)
Patient here for skin assessment of rash over forehead and behind ears.He stopped applying the biafine and has been using hydrocortisone 1% 2 to 3 times daily.The bumps have dried up some but he continues to have itching.Doing well from standpoint of lung cancer.Denies headache.Continues to go to pulmonary rehab.Prophylactic cranial irradiation completed on June 20, 2014.

## 2014-08-17 ENCOUNTER — Encounter (HOSPITAL_COMMUNITY)
Admission: RE | Admit: 2014-08-17 | Discharge: 2014-08-17 | Disposition: A | Discharge status: Home / Self Care | Payer: Self-pay | Source: Ambulatory Visit | Attending: Pulmonary Disease | Admitting: Pulmonary Disease

## 2014-08-20 ENCOUNTER — Encounter (HOSPITAL_COMMUNITY)
Admission: RE | Admit: 2014-08-20 | Discharge: 2014-08-20 | Disposition: A | Discharge status: Home / Self Care | Payer: Self-pay | Source: Ambulatory Visit | Attending: Pulmonary Disease | Admitting: Pulmonary Disease

## 2014-08-20 ENCOUNTER — Other Ambulatory Visit: Payer: 59

## 2014-08-21 ENCOUNTER — Other Ambulatory Visit: Payer: 59

## 2014-08-21 DIAGNOSIS — B20 Human immunodeficiency virus [HIV] disease: Secondary | ICD-10-CM

## 2014-08-22 ENCOUNTER — Encounter (HOSPITAL_COMMUNITY)
Admission: RE | Admit: 2014-08-22 | Discharge: 2014-08-22 | Disposition: A | Discharge status: Home / Self Care | Payer: Self-pay | Source: Ambulatory Visit | Attending: Pulmonary Disease | Admitting: Pulmonary Disease

## 2014-08-22 LAB — HIV-1 RNA QUANT-NO REFLEX-BLD: HIV-1 RNA Quant, Log: <1.3 {Log} (ref <1.30)

## 2014-08-23 LAB — T-HELPER CELL (CD4) - (RCID CLINIC ONLY)
CD4 % Helper T Cell: 39 % (ref 33–55)
CD4 T Cell Abs: 560 /uL (ref 400–2700)

## 2014-08-24 ENCOUNTER — Encounter (HOSPITAL_COMMUNITY)
Admission: RE | Admit: 2014-08-24 | Discharge: 2014-08-24 | Disposition: A | Discharge status: Home / Self Care | Payer: Self-pay | Source: Ambulatory Visit | Attending: Pulmonary Disease | Admitting: Pulmonary Disease

## 2014-08-27 ENCOUNTER — Encounter (HOSPITAL_COMMUNITY): Payer: Self-pay

## 2014-08-29 ENCOUNTER — Encounter (HOSPITAL_COMMUNITY): Payer: Self-pay

## 2014-08-31 ENCOUNTER — Encounter (HOSPITAL_COMMUNITY): Payer: Self-pay

## 2014-09-03 ENCOUNTER — Encounter (HOSPITAL_COMMUNITY): Payer: Self-pay

## 2014-09-04 ENCOUNTER — Ambulatory Visit: Payer: 59 | Admitting: Internal Medicine

## 2014-09-05 ENCOUNTER — Encounter (HOSPITAL_COMMUNITY): Payer: Self-pay

## 2014-09-07 ENCOUNTER — Encounter (HOSPITAL_COMMUNITY): Payer: Self-pay

## 2014-09-10 ENCOUNTER — Encounter (HOSPITAL_COMMUNITY): Payer: Self-pay

## 2014-09-12 ENCOUNTER — Encounter (HOSPITAL_COMMUNITY): Payer: Self-pay

## 2014-09-14 ENCOUNTER — Encounter (HOSPITAL_COMMUNITY): Payer: 59

## 2014-09-14 DIAGNOSIS — C3490 Malignant neoplasm of unspecified part of unspecified bronchus or lung: Secondary | ICD-10-CM | POA: Insufficient documentation

## 2014-09-17 ENCOUNTER — Encounter (HOSPITAL_COMMUNITY): Payer: Self-pay

## 2014-09-18 ENCOUNTER — Encounter: Payer: Self-pay | Admitting: Internal Medicine

## 2014-09-18 ENCOUNTER — Ambulatory Visit (INDEPENDENT_AMBULATORY_CARE_PROVIDER_SITE_OTHER): Payer: 59 | Admitting: Internal Medicine

## 2014-09-18 VITALS — BP 155/92 | HR 106 | Temp 98.8°F | Wt 178.0 lb

## 2014-09-18 DIAGNOSIS — B20 Human immunodeficiency virus [HIV] disease: Secondary | ICD-10-CM

## 2014-09-18 DIAGNOSIS — R634 Abnormal weight loss: Secondary | ICD-10-CM

## 2014-09-18 DIAGNOSIS — Z113 Encounter for screening for infections with a predominantly sexual mode of transmission: Secondary | ICD-10-CM | POA: Diagnosis not present

## 2014-09-18 DIAGNOSIS — Z79899 Other long term (current) drug therapy: Secondary | ICD-10-CM | POA: Diagnosis not present

## 2014-09-18 MED ORDER — MEGESTROL ACETATE 40 MG PO TABS: 40.0000 mg | ORAL_TABLET | Freq: Two times a day (BID) | ORAL | Status: DC

## 2014-09-18 MED ORDER — DRONABINOL 10 MG PO CAPS: 10.0000 mg | ORAL_CAPSULE | Freq: Two times a day (BID) | ORAL | Status: DC

## 2014-09-18 NOTE — Progress Notes (Signed)
   Subjective:    Patient ID: Jaime Morris, male    DOB: Feb 17, 1961, 54 y.o.   MRN: [id number]  HPI Mr. Campoy comes in for HIV.  He continues on Atripla and CD 4 of 560 with a continued undetectable viral load.  He has had no issues related to HIV.  He has completed chemotherapy and cranial radiation.     Review of Systems  Constitutional: Negative for fever, chills, fatigue and unexpected weight change.  Gastrointestinal: Negative for nausea and diarrhea.  Skin: Negative for rash.  Neurological: Negative for dizziness.  Hematological: Negative for adenopathy.       Objective:   Physical Exam  Constitutional: He appears well-developed and well-nourished. No distress.  HENT:  Mouth/Throat: No oropharyngeal exudate.  Eyes: No scleral icterus.  Cardiovascular: Normal rate, regular rhythm and normal heart sounds.   No murmur heard. Pulmonary/Chest: Effort normal and breath sounds normal. No respiratory distress.  Lymphadenopathy:    He has no cervical adenopathy.  Skin: No rash noted.          Assessment & Plan:

## 2014-09-18 NOTE — Assessment & Plan Note (Signed)
He is doing well from his HIV standpoint and will continue with a Atripla. He does have some issues with his insomnia but is not interested in changing Atripla at this time. He will return in 6 months with labs before.

## 2014-09-18 NOTE — Assessment & Plan Note (Signed)
I will try Marinol or Megace if Marinol is not covered.

## 2014-09-19 ENCOUNTER — Encounter (HOSPITAL_COMMUNITY): Payer: Self-pay

## 2014-09-21 ENCOUNTER — Encounter (HOSPITAL_COMMUNITY): Payer: Self-pay

## 2014-09-24 ENCOUNTER — Encounter (HOSPITAL_COMMUNITY): Payer: Self-pay

## 2014-09-24 ENCOUNTER — Telehealth: Payer: Self-pay | Admitting: *Deleted

## 2014-09-24 ENCOUNTER — Other Ambulatory Visit: Payer: Self-pay | Admitting: Internal Medicine

## 2014-09-24 NOTE — Telephone Encounter (Addendum)
Per Jaime Morris pt can be seen in coumadin clinic and must understand the monitoring lab work. Pt agreeable. Will ask Dr Jaime Morris to order  referral

## 2014-09-24 NOTE — Telephone Encounter (Signed)
Patient called asking if there is "something less expensive to take than Xarelto.  I've called to price this medicine and $300.00 is the lowest price and I can't afford this.  I must use [facility] and meet a deductible before they will pay anything else.  I have been out of Xarelto for about two weeks now.  There is something called warfarin or coumadin I understand is much cheaper."  Will notify Dr. Julien Nordmann of this call.  Return number is [phone].

## 2014-09-25 ENCOUNTER — Telehealth: Payer: Self-pay | Admitting: Pharmacist

## 2014-09-25 ENCOUNTER — Other Ambulatory Visit: Payer: Self-pay | Admitting: Internal Medicine

## 2014-09-25 DIAGNOSIS — C3490 Malignant neoplasm of unspecified part of unspecified bronchus or lung: Secondary | ICD-10-CM

## 2014-09-25 DIAGNOSIS — I2699 Other pulmonary embolism without acute cor pulmonale: Secondary | ICD-10-CM

## 2014-09-25 NOTE — Telephone Encounter (Addendum)
Dx: PE in 11/15 INR goal = 2-3 Spoke with patient regarding his referral to the coumadin clinic by Dr. Julien Nordmann. He is unable to take Xarelto due to financial concerns. He has been off Xarelto for about 2 weeks. Dr. Julien Nordmann is aware.  Pt would like to switch to coumadin. He will start coumadin 5mg  daily today. Rx called to [place] on [address]. No Lovenox bridge needed per Dr. Julien Nordmann. Will recheck INR on 10/01/14; lab at 10:45am and coumadin clinic at 11am.

## 2014-09-26 ENCOUNTER — Encounter (HOSPITAL_COMMUNITY): Payer: Self-pay

## 2014-09-27 ENCOUNTER — Telehealth: Payer: Self-pay

## 2014-09-27 NOTE — Telephone Encounter (Signed)
Mr. Jaime Morris called today stating he missed last night's dose of Coumadin. He is currently transitioning to Coumadin after having received Xarelto. His first dose of Coumadin was Tuesday, and then he missed last night's (Wednesday's) dose. He called today for further dosing instructions. Since he remembered his missed dose when it sooner to his next dose relative to his missed dose, I instructed him to just skip Wednesday's dose and resume his Coumadin today as it was previously scheduled.  We will see him Monday in Coumadin clinic, and if his INR is lower than expected, this is likely a contributing factor.   He confirmed he is having no other issues with his anticoagulation therapy, and verbalized his next appointment date and time. He knows to call us in the meantime if any issues arise.

## 2014-09-28 ENCOUNTER — Encounter (HOSPITAL_COMMUNITY)
Admission: RE | Admit: 2014-09-28 | Discharge: 2014-09-28 | Disposition: A | Discharge status: Home / Self Care | Payer: Self-pay | Source: Ambulatory Visit | Attending: Pulmonary Disease | Admitting: Pulmonary Disease

## 2014-09-28 ENCOUNTER — Encounter: Payer: Self-pay | Admitting: Internal Medicine

## 2014-09-28 NOTE — Progress Notes (Signed)
I placed [employer] forms on the desk of nurse for dr. Mckinley Jewel

## 2014-10-01 ENCOUNTER — Encounter (HOSPITAL_COMMUNITY)
Admission: RE | Admit: 2014-10-01 | Discharge: 2014-10-01 | Disposition: A | Discharge status: Home / Self Care | Payer: Self-pay | Source: Ambulatory Visit | Attending: Pulmonary Disease | Admitting: Pulmonary Disease

## 2014-10-01 ENCOUNTER — Other Ambulatory Visit (HOSPITAL_BASED_OUTPATIENT_CLINIC_OR_DEPARTMENT_OTHER): Payer: 59

## 2014-10-01 ENCOUNTER — Ambulatory Visit (HOSPITAL_BASED_OUTPATIENT_CLINIC_OR_DEPARTMENT_OTHER): Payer: Self-pay | Admitting: Pharmacist

## 2014-10-01 DIAGNOSIS — I2699 Other pulmonary embolism without acute cor pulmonale: Secondary | ICD-10-CM | POA: Diagnosis not present

## 2014-10-01 DIAGNOSIS — C3490 Malignant neoplasm of unspecified part of unspecified bronchus or lung: Secondary | ICD-10-CM

## 2014-10-01 LAB — PROTIME-INR
INR: 2 (ref 2.00–3.50)
Protime: 24 Seconds — ABNORMAL HIGH (ref 10.6–13.4)

## 2014-10-01 LAB — POCT INR: INR: 2

## 2014-10-01 NOTE — Progress Notes (Signed)
INR = 2.0     Goal 2-3 INR within goal range. No complications of anticoagulation noted. Patient started Coumadin 09/26/14, missed 1 dose on 4/14 and has taken daily since. Patient with [facility]; experienced a PE 04/27/14. He has been on Xarelto previously, transitioning to Coumadin due to cost. Coumadin education completed today. He plans to travel to [place] with his brother the last weekend of April and first week of May. He is aware he will need to get out of the car and walk every 1-2 hrs. He will continue Coumadin 5 mg daily. Return on 10/10/14 for lab at 9:45am, Coumadin clinic at 10am, and Dr. Julien Nordmann at 10:15.  Theone Murdoch, PharmD

## 2014-10-03 ENCOUNTER — Encounter (HOSPITAL_COMMUNITY): Payer: Self-pay

## 2014-10-03 ENCOUNTER — Ambulatory Visit: Payer: 59

## 2014-10-03 ENCOUNTER — Other Ambulatory Visit (HOSPITAL_BASED_OUTPATIENT_CLINIC_OR_DEPARTMENT_OTHER): Payer: 59

## 2014-10-03 ENCOUNTER — Ambulatory Visit (HOSPITAL_COMMUNITY)
Admission: RE | Admit: 2014-10-03 | Discharge: 2014-10-03 | Disposition: A | Discharge status: Home / Self Care | Payer: 59 | Source: Ambulatory Visit | Attending: Internal Medicine | Admitting: Internal Medicine

## 2014-10-03 VITALS — BP 136/85 | HR 75 | Temp 98.7°F

## 2014-10-03 DIAGNOSIS — Z720 Tobacco use: Secondary | ICD-10-CM | POA: Diagnosis not present

## 2014-10-03 DIAGNOSIS — C3490 Malignant neoplasm of unspecified part of unspecified bronchus or lung: Secondary | ICD-10-CM | POA: Insufficient documentation

## 2014-10-03 DIAGNOSIS — C3411 Malignant neoplasm of upper lobe, right bronchus or lung: Secondary | ICD-10-CM | POA: Diagnosis not present

## 2014-10-03 DIAGNOSIS — I2699 Other pulmonary embolism without acute cor pulmonale: Secondary | ICD-10-CM

## 2014-10-03 DIAGNOSIS — Z95828 Presence of other vascular implants and grafts: Secondary | ICD-10-CM

## 2014-10-03 LAB — CBC WITH DIFFERENTIAL/PLATELET
BASO%: 1.3 % (ref 0.0–2.0)
BASOS ABS: 0.1 10*3/uL (ref 0.0–0.1)
EOS%: 1.4 % (ref 0.0–7.0)
Eosinophils Absolute: 0.1 10*3/uL (ref 0.0–0.5)
HCT: 43.7 % (ref 38.4–49.9)
HGB: 14.4 g/dL (ref 13.0–17.1)
LYMPH%: 28.3 % (ref 14.0–49.0)
MCH: 30.7 pg (ref 27.2–33.4)
MCHC: 33 g/dL (ref 32.0–36.0)
MCV: 93 fL (ref 79.3–98.0)
MONO#: 0.4 10*3/uL (ref 0.1–0.9)
MONO%: 6 % (ref 0.0–14.0)
NEUT#: 3.9 10*3/uL (ref 1.5–6.5)
NEUT%: 63 % (ref 39.0–75.0)
Platelets: 242 10*3/uL (ref 140–400)
RBC: 4.7 10*6/uL (ref 4.20–5.82)
RDW: 14.4 % (ref 11.0–14.6)
WBC: 6.2 10*3/uL (ref 4.0–10.3)
lymph#: 1.7 10*3/uL (ref 0.9–3.3)

## 2014-10-03 LAB — COMPREHENSIVE METABOLIC PANEL (CC13)
ALBUMIN: 3.8 g/dL (ref 3.5–5.0)
ALT: 28 U/L (ref 0–55)
AST: 28 U/L (ref 5–34)
Alkaline Phosphatase: 104 U/L (ref 40–150)
Anion Gap: 14 mEq/L — ABNORMAL HIGH (ref 3–11)
BUN: 6.7 mg/dL — AB (ref 7.0–26.0)
CHLORIDE: 108 meq/L (ref 98–109)
CO2: 18 mEq/L — ABNORMAL LOW (ref 22–29)
Calcium: 8.8 mg/dL (ref 8.4–10.4)
Creatinine: 0.8 mg/dL (ref 0.7–1.3)
Glucose: 103 mg/dl (ref 70–140)
POTASSIUM: 4.4 meq/L (ref 3.5–5.1)
Sodium: 141 mEq/L (ref 136–145)
Total Bilirubin: 0.2 mg/dL (ref 0.20–1.20)
Total Protein: 7.2 g/dL (ref 6.4–8.3)

## 2014-10-03 MED ORDER — SODIUM CHLORIDE 0.9 % IJ SOLN
10.0000 mL | INTRAMUSCULAR | Status: DC | PRN
  Administered 2014-10-03: 10 mL via INTRAVENOUS
  Filled 2014-10-03: qty 10

## 2014-10-03 MED ORDER — IOHEXOL 300 MG/ML  SOLN
100.0000 mL | Freq: Once | INTRAMUSCULAR | Status: AC | PRN
  Administered 2014-10-03: 100 mL via INTRAVENOUS

## 2014-10-03 MED ORDER — HEPARIN SOD (PORK) LOCK FLUSH 100 UNIT/ML IV SOLN
500.0000 [IU] | Freq: Once | INTRAVENOUS | Status: AC
  Administered 2014-10-03: 500 [IU] via INTRAVENOUS
  Filled 2014-10-03: qty 5

## 2014-10-03 NOTE — Progress Notes (Signed)
Patient in for Port-A-Cath access and labs today. Patient's port accessed with PowerLoc needle and flushed without any difficulty. Blood return noted with flush, and labs obtained. Patient's port was left accessed and covered with Tegaderm dressing for CT appointment. Explained to Patient that if the needle is not removed after his CT appointment, to return to the [facility] to have needle removed. Patient verbalized understanding. Patient ambulating and discharged from the Flush Room without any complaints.

## 2014-10-03 NOTE — Patient Instructions (Signed)

## 2014-10-05 ENCOUNTER — Encounter (HOSPITAL_COMMUNITY)
Admission: RE | Admit: 2014-10-05 | Discharge: 2014-10-05 | Disposition: A | Discharge status: Home / Self Care | Payer: Self-pay | Source: Ambulatory Visit | Attending: Pulmonary Disease | Admitting: Pulmonary Disease

## 2014-10-08 ENCOUNTER — Encounter: Payer: Self-pay | Admitting: Internal Medicine

## 2014-10-08 ENCOUNTER — Encounter (HOSPITAL_COMMUNITY): Payer: Self-pay

## 2014-10-08 ENCOUNTER — Institutional Professional Consult (permissible substitution): Payer: 59 | Admitting: Pulmonary Disease

## 2014-10-08 NOTE — Progress Notes (Signed)
I called to let the patient know I faxed forms to [employer]  [phone] and will have copy for him upfront on his next visit.

## 2014-10-10 ENCOUNTER — Ambulatory Visit (HOSPITAL_BASED_OUTPATIENT_CLINIC_OR_DEPARTMENT_OTHER): Payer: 59 | Admitting: Pharmacist

## 2014-10-10 ENCOUNTER — Encounter: Payer: Self-pay | Admitting: Internal Medicine

## 2014-10-10 ENCOUNTER — Telehealth: Payer: Self-pay | Admitting: Internal Medicine

## 2014-10-10 ENCOUNTER — Other Ambulatory Visit (HOSPITAL_BASED_OUTPATIENT_CLINIC_OR_DEPARTMENT_OTHER): Payer: 59

## 2014-10-10 ENCOUNTER — Ambulatory Visit (HOSPITAL_BASED_OUTPATIENT_CLINIC_OR_DEPARTMENT_OTHER): Payer: 59 | Admitting: Internal Medicine

## 2014-10-10 ENCOUNTER — Encounter (HOSPITAL_COMMUNITY): Payer: Self-pay

## 2014-10-10 VITALS — BP 156/86 | HR 95 | Temp 98.9°F | Resp 18 | Ht 73.0 in | Wt 174.5 lb

## 2014-10-10 DIAGNOSIS — C3411 Malignant neoplasm of upper lobe, right bronchus or lung: Secondary | ICD-10-CM

## 2014-10-10 DIAGNOSIS — I2699 Other pulmonary embolism without acute cor pulmonale: Secondary | ICD-10-CM

## 2014-10-10 DIAGNOSIS — C3490 Malignant neoplasm of unspecified part of unspecified bronchus or lung: Secondary | ICD-10-CM

## 2014-10-10 LAB — PROTIME-INR
INR: 3.6 — ABNORMAL HIGH (ref 2.00–3.50)
PROTIME: 43.2 s — AB (ref 10.6–13.4)

## 2014-10-10 LAB — POCT INR: INR: 3.6

## 2014-10-10 NOTE — Telephone Encounter (Signed)
s.w. pt and advised on JULY appt.Marland KitchenMarland KitchenMarland KitchenMarland Kitchenpt ok and aware

## 2014-10-10 NOTE — Progress Notes (Addendum)
INR above goal today.  No changes in meds.  No bleeding or bruising.  Mr Jaime Morris has been on coumadin '5mg'$  daily since starting on 09/25/14.  Will slightly decrease coumadin dose to 2.'5mg'$  on wed and '5mg'$  other days.  Mr Jaime Morris will be driving to [place] then to [facility] and back to [place] next week.  I encouraged him to move and flex legs while in car and stop car to walk around.  He knows to call coumadin clinic if he has any bleeding or unusual bleeding over the next 2 weeks.  Will check PT/INR when he returns from trip in 2 weeks.  He has appt with Dr. Julien Nordmann today.

## 2014-10-10 NOTE — Patient Instructions (Signed)
Smoking Cessation, Tips for Success  If you are ready to quit smoking, congratulations! You have chosen to help yourself be healthier. Cigarettes bring nicotine, tar, carbon monoxide, and other irritants into your body. Your lungs, heart, and blood vessels will be able to work better without these poisons. There are many different ways to quit smoking. Nicotine gum, nicotine patches, a nicotine inhaler, or nicotine nasal spray can help with physical craving. Hypnosis, support groups, and medicines help break the habit of smoking.  WHAT THINGS CAN I DO TO MAKE QUITTING EASIER?   Here are some tips to help you quit for good:  · Pick a date when you will quit smoking completely. Tell all of your friends and family about your plan to quit on that date.  · Do not try to slowly cut down on the number of cigarettes you are smoking. Pick a quit date and quit smoking completely starting on that day.  · Throw away all cigarettes.    · Clean and remove all ashtrays from your home, work, and car.  · On a card, write down your reasons for quitting. Carry the card with you and read it when you get the urge to smoke.  · Cleanse your body of nicotine. Drink enough water and fluids to keep your urine clear or pale yellow. Do this after quitting to flush the nicotine from your body.  · Learn to predict your moods. Do not let a bad situation be your excuse to have a cigarette. Some situations in your life might tempt you into wanting a cigarette.  · Never have "just one" cigarette. It leads to wanting another and another. Remind yourself of your decision to quit.  · Change habits associated with smoking. If you smoked while driving or when feeling stressed, try other activities to replace smoking. Stand up when drinking your coffee. Brush your teeth after eating. Sit in a different chair when you read the paper. Avoid alcohol while trying to quit, and try to drink fewer caffeinated beverages. Alcohol and caffeine may urge you to  smoke.  · Avoid foods and drinks that can trigger a desire to smoke, such as sugary or spicy foods and alcohol.  · Ask people who smoke not to smoke around you.  · Have something planned to do right after eating or having a cup of coffee. For example, plan to take a walk or exercise.  · Try a relaxation exercise to calm you down and decrease your stress. Remember, you may be tense and nervous for the first 2 weeks after you quit, but this will pass.  · Find new activities to keep your hands busy. Play with a pen, coin, or rubber band. Doodle or draw things on paper.  · Brush your teeth right after eating. This will help cut down on the craving for the taste of tobacco after meals. You can also try mouthwash.    · Use oral substitutes in place of cigarettes. Try using lemon drops, carrots, cinnamon sticks, or chewing gum. Keep them handy so they are available when you have the urge to smoke.  · When you have the urge to smoke, try deep breathing.  · Designate your home as a nonsmoking area.  · If you are a heavy smoker, ask your health care provider about a prescription for nicotine chewing gum. It can ease your withdrawal from nicotine.  · Reward yourself. Set aside the cigarette money you save and buy yourself something nice.  · Look for   support from others. Join a support group or smoking cessation program. Ask someone at home or at work to help you with your plan to quit smoking.  · Always ask yourself, "Do I need this cigarette or is this just a reflex?" Tell yourself, "Today, I choose not to smoke," or "I do not want to smoke." You are reminding yourself of your decision to quit.  · Do not replace cigarette smoking with electronic cigarettes (commonly called e-cigarettes). The safety of e-cigarettes is unknown, and some may contain harmful chemicals.  · If you relapse, do not give up! Plan ahead and think about what you will do the next time you get the urge to smoke.  HOW WILL I FEEL WHEN I QUIT SMOKING?  You  may have symptoms of withdrawal because your body is used to nicotine (the addictive substance in cigarettes). You may crave cigarettes, be irritable, feel very hungry, cough often, get headaches, or have difficulty concentrating. The withdrawal symptoms are only temporary. They are strongest when you first quit but will go away within 10-14 days. When withdrawal symptoms occur, stay in control. Think about your reasons for quitting. Remind yourself that these are signs that your body is healing and getting used to being without cigarettes. Remember that withdrawal symptoms are easier to treat than the major diseases that smoking can cause.   Even after the withdrawal is over, expect periodic urges to smoke. However, these cravings are generally short lived and will go away whether you smoke or not. Do not smoke!  WHAT RESOURCES ARE AVAILABLE TO HELP ME QUIT SMOKING?  Your health care provider can direct you to community resources or hospitals for support, which may include:  · Group support.  · Education.  · Hypnosis.  · Therapy.  Document Released: 02/28/2004 Document Revised: 10/16/2013 Document Reviewed: 11/17/2012  [employer]® Patient Information ©2015 [employer]. This information is not intended to replace advice given to you by your health care provider. Make sure you discuss any questions you have with your health care provider.

## 2014-10-10 NOTE — Progress Notes (Signed)
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 2) incidental diagnosis of pulmonary embolism on CT scan of the chest performed on 04/18/2014. Primary site: Lung (Right)  Staging method: AJCC 7th Edition  Clinical: Stage IV (T1a, N1, M1b) signed by Curt Bears, MD on 12/07/2013 3:19 PM  Summary: Stage IV (T1a, N1, M1b)   PRIOR THERAPY:  1)  Systemic chemotherapy with carboplatin for AUC of 5 given on day 1, etoposide 100 mg/m2 given on days 1, 2 an3 with neulast support on day 4. Status post 6 cycles.  2) Lovenox 120 mg subcutaneously started 04/18/2014. 3) status post whole brain irradiation under the care of Dr. Sondra Come completed 06/21/2014  CURRENT THERAPY:  1) Observation for the small cell lung cancer. 2) Xarelto 20 mg by mouth daily. First dose of treatment on 05/03/2014. This was switched on 10/01/2014 to Coumadin and followed by the Coumadin clinic at the [facility].  DISEASE STAGE:  Small cell carcinoma of lung, Extensive stage  Primary site: Lung (Right)  Staging method: AJCC 7th Edition  Clinical: Stage IV (T1a, N1, M1b) signed by Curt Bears, MD on 12/07/2013 3:19 PM  Summary: Stage IV (T1a, N1, M1b)  CHEMOTHERAPY INTENT: pallative  CURRENT # OF CHEMOTHERAPY CYCLES: 0 CURRENT ANTIEMETICS: Zofran, dexamethasone and compazine  CURRENT SMOKING STATUS: Former smoker, quit 12/18/2013  ORAL CHEMOTHERAPY AND CONSENT: n/a  CURRENT BISPHOSPHONATES USE: none  PAIN MANAGEMENT: Lortab 5/325 mg  NARCOTICS INDUCED CONSTIPATION: none  LIVING WILL AND CODE STATUS: ?   INTERVAL HISTORY: Jaime Morris 54 y.o. male returns to the clinic today for followup visit. The patient is feeling fine today with no specific complaints except for small irritation of the  scalp as his hair starts growing. He was recently switched from Xarelto to Coumadin because of cost issues. He is followed by the Coumadin clinic at the [facility] and currently on Coumadin 5 mg by mouth daily except Wednesday 2.5 mg. He denied having any significant weight loss or night sweats. He has no chest pain, shortness of breath, cough or hemoptysis. He has no nausea or vomiting. No fever or chills. The patient has repeat CT scan of the chest, abdomen and pelvis performed recently and he is here for evaluation and discussion of his scan results.  MEDICAL HISTORY: Past Medical History  Diagnosis Date  . HIV infection     DR. [name]  . Hyperlipidemia   . Insomnia   . Lung nodule, multiple 10/24/13    CT CHEST W/O...DR. [name]  . Gynecomastia, male 10/16/13    MILD RIGHT SIDED PER RADIOLOGY STUDY  . COPD (chronic obstructive pulmonary disease)     PFT'S 10/26/13 @ [facility]  . History of depression   . History of pneumonia   . Small cell lung cancer   . Collapsed lung     d/t biopsy, right  . 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  MEDICATIONS:  Current Outpatient Prescriptions  Medication Sig Dispense Refill  . albuterol (PROAIR HFA) 108 (90 BASE) MCG/ACT inhaler Inhale 2 puffs into the lungs every 4 (four) hours as needed for wheezing or shortness of breath.     . doxylamine, Sleep, (UNISOM) 25  MG tablet Take 50 mg by mouth at bedtime as needed for sleep.     Marland Kitchen efavirenz-emtricitabine-tenofovir (ATRIPLA) 600-200-300 MG per tablet Take 1 tablet by mouth daily. 90 tablet 3  . emollient (BIAFINE) cream Apply topically as needed.    . fluticasone (FLONASE) 50 MCG/ACT nasal spray Place 2 sprays into both nostrils daily as needed for allergies.     . megestrol (MEGACE) 40 MG tablet Take 1 tablet (40 mg total) by mouth 2 (two) times daily. 60 tablet 5  . Melatonin 10 MG TABS Take 10 mg by mouth at bedtime as  needed (sleep).    . Tiotropium Bromide Monohydrate (SPIRIVA RESPIMAT) 2.5 MCG/ACT AERS Inhale 2 puffs into the lungs daily. 3 Inhaler 2  . traZODone (DESYREL) 100 MG tablet Take 100 mg by mouth at bedtime.    . vitamin B-12 (CYANOCOBALAMIN) 500 MCG tablet Take 500 mcg by mouth daily.    Marland Kitchen warfarin (COUMADIN) 5 MG tablet Take 5 mg by mouth daily. Take as directed .    . prochlorperazine (COMPAZINE) 10 MG tablet Take 1 tablet (10 mg total) by mouth every 6 (six) hours as needed for nausea or vomiting. (Patient not taking: Reported on 09/18/2014) 30 tablet 1  . varenicline (CHANTIX) 1 MG tablet Take 1 mg by mouth 2 (two) times daily.      No current facility-administered medications for this visit.   Facility-Administered Medications Ordered in Other Visits  Medication Dose Route Frequency Provider Last Rate Last Dose  . sodium chloride 0.9 % injection 10 mL  10 mL Intracatheter PRN Curt Bears, MD   10 mL at 01/30/14 1451    SURGICAL HISTORY:  Past Surgical History  Procedure Laterality Date  . Foot surgery      BUNIONECTOMY  . Back surgery      L 4-5 FUSION WITH SCREW  . Colonoscopy  UTD  . Portacath placement N/A 12/08/2013    Procedure: INSERTION PORT-A-CATH;  Surgeon: Grace Isaac, MD;  Location: [facility];  Service: Thoracic;  Laterality: N/A;    REVIEW OF SYSTEMS:  Constitutional: positive for fatigue Eyes: negative Ears, nose, mouth, throat, and face: negative Respiratory: negative Cardiovascular: negative Gastrointestinal: negative Genitourinary:negative Integument/breast: negative Hematologic/lymphatic: negative Musculoskeletal:negative Neurological: negative Behavioral/Psych: negative Endocrine: negative Allergic/Immunologic: negative   PHYSICAL EXAMINATION: General appearance: alert, cooperative, fatigued and no distress Head: Normocephalic, without obvious abnormality, atraumatic Neck: no adenopathy, no JVD, supple, symmetrical, trachea midline and thyroid not  enlarged, symmetric, no tenderness/mass/nodules Lymph nodes: Cervical, supraclavicular, and axillary nodes normal. Resp: clear to auscultation bilaterally Back: symmetric, no curvature. ROM normal. No CVA tenderness. Cardio: regular rate and rhythm, S1, S2 normal, no murmur, click, rub or gallop GI: soft, non-tender; bowel sounds normal; no masses,  no organomegaly Extremities: extremities normal, atraumatic, no cyanosis or edema Neurologic: Alert and oriented X 3, normal strength and tone. Normal symmetric reflexes. Normal coordination and gait  ECOG PERFORMANCE STATUS: 1 - Symptomatic but completely ambulatory  Blood pressure 156/86, pulse 95, temperature 98.9 F (37.2 C), temperature source Oral, resp. rate 18, height '6\' 1"'$  (1.854 m), weight 174 lb 8 oz (79.153 kg), SpO2 99 %.  LABORATORY DATA: Lab Results  Component Value Date   WBC 6.2 10/03/2014   HGB 14.4 10/03/2014   HCT 43.7 10/03/2014   MCV 93.0 10/03/2014   PLT 242 10/03/2014      Chemistry      Component Value Date/Time   NA 141 10/03/2014 1017   NA 134* 01/22/2014  0955   K 4.4 10/03/2014 1017   K 4.2 01/22/2014 0955   CL 102 01/22/2014 0955   CO2 18* 10/03/2014 1017   CO2 23 01/22/2014 0955   BUN 6.7* 10/03/2014 1017   BUN 12 01/22/2014 0955   CREATININE 0.8 10/03/2014 1017   CREATININE 0.97 01/22/2014 0955   CREATININE 0.79 10/23/2013 1112      Component Value Date/Time   CALCIUM 8.8 10/03/2014 1017   CALCIUM 9.6 01/22/2014 0955   ALKPHOS 104 10/03/2014 1017   ALKPHOS 138* 01/22/2014 0955   AST 28 10/03/2014 1017   AST 23 01/22/2014 0955   ALT 28 10/03/2014 1017   ALT 28 01/22/2014 0955   BILITOT 0.20 10/03/2014 1017   BILITOT 0.5 01/22/2014 0955       RADIOGRAPHIC STUDIES: Ct Chest W Contrast  10/03/2014   CLINICAL DATA:  Restaging small cell lung cancer  EXAM: CT CHEST, ABDOMEN, AND PELVIS WITH CONTRAST  TECHNIQUE: Multidetector CT imaging of the chest, abdomen and pelvis was performed  following the standard protocol during bolus administration of intravenous contrast.  CONTRAST:  131m OMNIPAQUE IOHEXOL 300 MG/ML  SOLN  COMPARISON:  06/27/2014  FINDINGS: CT CHEST FINDINGS  Chest wall: No chest wall masses, supraclavicular or axillary lymphadenopathy. The left-sided Port-A-Cath is stable. The bony thorax is intact. No destructive bone lesions or spinal canal compromise.  Mediastinum: The heart is normal in size. No pericardial effusion. There are persistent small right hilar lymph nodes and probable treated tumor. No enlarged mediastinal lymph nodes. The aorta is normal in caliber. No dissection. The esophagus is grossly normal.  Lungs/pleura: Stable advanced emphysematous changes with apical bulla. There are areas of compressive atelectasis. There are numerous small scattered bilateral pulmonary nodules. These are unchanged. No new or enlarging lesions. No pleural effusion or acute pulmonary findings.  CT ABDOMEN AND PELVIS FINDINGS  Hepatobiliary: No focal hepatic lesions or intrahepatic biliary dilatation. The gallbladder is normal. No common bile duct dilatation.  Pancreas: Normal and stable.  Spleen: Normal size.  No focal lesions.  Adrenals/Urinary Tract: The adrenal glands and kidneys are normal and stable.  Stomach/Bowel: The stomach, duodenum, small bowel and colon are grossly normal. No inflammatory changes, mass lesions or obstructive findings. The terminal ileum is normal. The appendix is normal. Scattered diverticulosis of the sigmoid colon but no findings for diverticulitis.  Vascular/Lymphatic: No mesenteric or retroperitoneal mass or adenopathy. The aorta is normal in caliber. Stable atherosclerotic calcifications.  Other: The bladder, prostate gland and seminal vesicles are unremarkable. No pelvic mass, adenopathy or free pelvic fluid collections. No inguinal mass or adenopathy.  Musculoskeletal: No significant bony findings. Stable lumbar fusion hardware.  IMPRESSION: Stable  right hilar lymph nodes and probable treated tumor. No findings suspicious for recurrent or progressive tumor.  Emphysematous changes and pulmonary scarring.  Stable small scattered pulmonary nodules. No new or progressive lesions.  No findings for abdominal/ metastatic disease in the abdomen/pelvis.   Electronically Signed   By: PMarijo SanesM.D.   On: 10/03/2014 14:33   Ct Abdomen Pelvis W Contrast  10/03/2014   CLINICAL DATA:  Restaging small cell lung cancer  EXAM: CT CHEST, ABDOMEN, AND PELVIS WITH CONTRAST  TECHNIQUE: Multidetector CT imaging of the chest, abdomen and pelvis was performed following the standard protocol during bolus administration of intravenous contrast.  CONTRAST:  1075mOMNIPAQUE IOHEXOL 300 MG/ML  SOLN  COMPARISON:  06/27/2014  FINDINGS: CT CHEST FINDINGS  Chest wall: No chest wall masses, supraclavicular or axillary lymphadenopathy.  The left-sided Port-A-Cath is stable. The bony thorax is intact. No destructive bone lesions or spinal canal compromise.  Mediastinum: The heart is normal in size. No pericardial effusion. There are persistent small right hilar lymph nodes and probable treated tumor. No enlarged mediastinal lymph nodes. The aorta is normal in caliber. No dissection. The esophagus is grossly normal.  Lungs/pleura: Stable advanced emphysematous changes with apical bulla. There are areas of compressive atelectasis. There are numerous small scattered bilateral pulmonary nodules. These are unchanged. No new or enlarging lesions. No pleural effusion or acute pulmonary findings.  CT ABDOMEN AND PELVIS FINDINGS  Hepatobiliary: No focal hepatic lesions or intrahepatic biliary dilatation. The gallbladder is normal. No common bile duct dilatation.  Pancreas: Normal and stable.  Spleen: Normal size.  No focal lesions.  Adrenals/Urinary Tract: The adrenal glands and kidneys are normal and stable.  Stomach/Bowel: The stomach, duodenum, small bowel and colon are grossly normal. No  inflammatory changes, mass lesions or obstructive findings. The terminal ileum is normal. The appendix is normal. Scattered diverticulosis of the sigmoid colon but no findings for diverticulitis.  Vascular/Lymphatic: No mesenteric or retroperitoneal mass or adenopathy. The aorta is normal in caliber. Stable atherosclerotic calcifications.  Other: The bladder, prostate gland and seminal vesicles are unremarkable. No pelvic mass, adenopathy or free pelvic fluid collections. No inguinal mass or adenopathy.  Musculoskeletal: No significant bony findings. Stable lumbar fusion hardware.  IMPRESSION: Stable right hilar lymph nodes and probable treated tumor. No findings suspicious for recurrent or progressive tumor.  Emphysematous changes and pulmonary scarring.  Stable small scattered pulmonary nodules. No new or progressive lesions.  No findings for abdominal/ metastatic disease in the abdomen/pelvis.   Electronically Signed   By: Marijo Sanes M.D.   On: 10/03/2014 14:33   ASSESSMENT AND PLAN:  This is a very pleasant 54 years old [place] American male recently diagnosed with:  1) Extensive stage small cell lung cancer: He is status post 6 cycles of systemic chemotherapy with carboplatin and etoposide. This was followed by prophylactic cranial irradiation.  Has been on observation and the recent CT scan of the chest, abdomen and pelvis showed no evidence for disease progression. I discussed the scan results with the patient.  I recommended for him to continue on observation with repeat CT scan of the chest, abdomen and pelvis in 3 months for reevaluation of his disease.   2) right lower lobe pulmonary embolus: He was switched from Xarelto to Coumadin recently because of cost issues. He is followed by the Coumadin clinic and his dose will be adjusted as needed.  He will also continue with Port-A-Cath flush every 6 weeks.  He was advised to call immediately if he has any concerning symptoms in the  interval. The patient voices understanding of current disease status and treatment options and is in agreement with the current care plan.  All questions were answered. The patient knows to call the clinic with any problems, questions or concerns. We can certainly see the patient much sooner if necessary.  Disclaimer: This note was dictated with voice recognition software. Similar sounding words can inadvertently be transcribed and may not be corrected upon review.

## 2014-10-12 ENCOUNTER — Encounter (HOSPITAL_COMMUNITY): Payer: Self-pay

## 2014-10-15 ENCOUNTER — Encounter (HOSPITAL_COMMUNITY): Payer: Self-pay

## 2014-10-15 DIAGNOSIS — C3490 Malignant neoplasm of unspecified part of unspecified bronchus or lung: Secondary | ICD-10-CM | POA: Insufficient documentation

## 2014-10-17 ENCOUNTER — Encounter (HOSPITAL_COMMUNITY): Payer: 59

## 2014-10-19 ENCOUNTER — Telehealth: Payer: Self-pay | Admitting: Internal Medicine

## 2014-10-19 ENCOUNTER — Encounter (HOSPITAL_COMMUNITY): Payer: Self-pay

## 2014-10-19 NOTE — Telephone Encounter (Signed)
Faxed pt medical records to [facility] physicians [phone]

## 2014-10-20 ENCOUNTER — Other Ambulatory Visit: Payer: Self-pay | Admitting: Internal Medicine

## 2014-10-21 ENCOUNTER — Other Ambulatory Visit: Payer: Self-pay | Admitting: Internal Medicine

## 2014-10-22 ENCOUNTER — Encounter (HOSPITAL_COMMUNITY)
Admission: RE | Admit: 2014-10-22 | Discharge: 2014-10-22 | Disposition: A | Discharge status: Home / Self Care | Payer: Self-pay | Source: Ambulatory Visit | Attending: Pulmonary Disease | Admitting: Pulmonary Disease

## 2014-10-24 ENCOUNTER — Encounter (HOSPITAL_COMMUNITY): Payer: Self-pay

## 2014-10-25 ENCOUNTER — Other Ambulatory Visit: Payer: 59

## 2014-10-25 ENCOUNTER — Telehealth: Payer: Self-pay | Admitting: Pharmacist

## 2014-10-25 ENCOUNTER — Ambulatory Visit: Payer: 59

## 2014-10-25 ENCOUNTER — Ambulatory Visit
Admission: RE | Admit: 2014-10-25 | Discharge: 2014-10-25 | Disposition: A | Discharge status: Home / Self Care | Payer: 59 | Source: Ambulatory Visit | Attending: Radiation Oncology | Admitting: Radiation Oncology

## 2014-10-25 NOTE — Telephone Encounter (Signed)
Pt FTKA today for lab and CC. Pt forgot. Called pt and rescheduled for next week on 10/30/14. Message to scheduling   Thank you,  Montel Clock, PharmD, BCOP

## 2014-10-26 ENCOUNTER — Encounter (HOSPITAL_COMMUNITY): Payer: Self-pay

## 2014-10-29 ENCOUNTER — Encounter (HOSPITAL_COMMUNITY): Payer: Self-pay

## 2014-10-30 ENCOUNTER — Other Ambulatory Visit: Payer: 59

## 2014-10-30 ENCOUNTER — Telehealth: Payer: Self-pay | Admitting: Pharmacist

## 2014-10-30 ENCOUNTER — Ambulatory Visit: Payer: 59

## 2014-10-30 NOTE — Telephone Encounter (Signed)
LVM to have him call back and r/s missed CC appmt on 10/30/14

## 2014-10-31 ENCOUNTER — Encounter (HOSPITAL_COMMUNITY): Payer: Self-pay

## 2014-11-02 ENCOUNTER — Encounter (HOSPITAL_COMMUNITY): Payer: Self-pay

## 2014-11-05 ENCOUNTER — Encounter (HOSPITAL_COMMUNITY): Payer: Self-pay

## 2014-11-07 ENCOUNTER — Encounter (HOSPITAL_COMMUNITY): Payer: Self-pay

## 2014-11-09 ENCOUNTER — Ambulatory Visit (HOSPITAL_BASED_OUTPATIENT_CLINIC_OR_DEPARTMENT_OTHER): Payer: 59

## 2014-11-09 ENCOUNTER — Other Ambulatory Visit: Payer: 59

## 2014-11-09 ENCOUNTER — Telehealth: Payer: Self-pay | Admitting: Internal Medicine

## 2014-11-09 ENCOUNTER — Other Ambulatory Visit (HOSPITAL_COMMUNITY)
Admission: RE | Admit: 2014-11-09 | Discharge: 2014-11-09 | Disposition: A | Discharge status: Home / Self Care | Payer: 59 | Source: Ambulatory Visit | Attending: Internal Medicine | Admitting: Internal Medicine

## 2014-11-09 ENCOUNTER — Ambulatory Visit (HOSPITAL_BASED_OUTPATIENT_CLINIC_OR_DEPARTMENT_OTHER): Payer: 59 | Admitting: Pharmacist

## 2014-11-09 DIAGNOSIS — Z720 Tobacco use: Secondary | ICD-10-CM | POA: Insufficient documentation

## 2014-11-09 DIAGNOSIS — I2699 Other pulmonary embolism without acute cor pulmonale: Secondary | ICD-10-CM

## 2014-11-09 DIAGNOSIS — C3490 Malignant neoplasm of unspecified part of unspecified bronchus or lung: Secondary | ICD-10-CM

## 2014-11-09 DIAGNOSIS — C3411 Malignant neoplasm of upper lobe, right bronchus or lung: Secondary | ICD-10-CM | POA: Diagnosis not present

## 2014-11-09 LAB — PROTIME-INR
INR: 4.07 — ABNORMAL HIGH (ref 0.00–1.49)
Prothrombin Time: 38.5 seconds — ABNORMAL HIGH (ref 11.6–15.2)

## 2014-11-09 LAB — POCT INR: INR: 4.07

## 2014-11-09 MED ORDER — SODIUM CHLORIDE 0.9 % IJ SOLN
10.0000 mL | INTRAMUSCULAR | Status: DC | PRN
  Administered 2014-11-09: 10 mL via INTRAVENOUS
  Filled 2014-11-09: qty 10

## 2014-11-09 MED ORDER — WARFARIN SODIUM 5 MG PO TABS: 5.0000 mg | ORAL_TABLET | ORAL | Status: DC

## 2014-11-09 MED ORDER — HEPARIN SOD (PORK) LOCK FLUSH 100 UNIT/ML IV SOLN
500.0000 [IU] | Freq: Once | INTRAVENOUS | Status: AC
  Administered 2014-11-09: 500 [IU] via INTRAVENOUS
  Filled 2014-11-09: qty 5

## 2014-11-09 NOTE — Patient Instructions (Signed)

## 2014-11-09 NOTE — Progress Notes (Addendum)
INR = 4.07 via venopuncture. INR above goal today despite slight decrease in dose at last visit. Pt started a multivitamin about 2 weeks ago. It does contain vitamin K. No missed or extra coumadin doses. No problems or concerns regarding anticoagulation. No unusual bruising. No bleeding noted. No s/s of clotting noted. No changes in diet. Pt asked if the tingling on his torso could be related to coumadin. We discussed that is very unlikely.  It could be related to his sleeping position. Pt will monitor and let us know if it worsens. Pt will be out of town 6/1-6/7. Will hold coumadin today.  Take coumadin 2.'5mg'$  on 5/28 and 5/30. Take coumadin '5mg'$  on 11/11/14.  Recheck INR on 11/13/14; lab at 11am and coumadin clinic at 11:15am. Warfarin refill rx called to pharmacy ([facility] on [address] in [place]).

## 2014-11-09 NOTE — Addendum Note (Signed)
Addended by: Neysa Hotter on: 11/09/2014 02:49 PM   Modules accepted: Orders, Medications

## 2014-11-09 NOTE — Patient Instructions (Signed)
Hold coumadin today. Take Coumadin 2.'5mg'$  on 5/28 and 5/30. Take Coumadin '5mg'$  on 11/11/14. Recheck INR on 11/13/14; Lab at 11am and coumadin clinic at 11:15am.

## 2014-11-09 NOTE — Telephone Encounter (Signed)
pt came to get copy of sch-per pt req to CX flush

## 2014-11-13 ENCOUNTER — Other Ambulatory Visit (HOSPITAL_BASED_OUTPATIENT_CLINIC_OR_DEPARTMENT_OTHER): Payer: 59

## 2014-11-13 ENCOUNTER — Ambulatory Visit (HOSPITAL_BASED_OUTPATIENT_CLINIC_OR_DEPARTMENT_OTHER): Payer: 59 | Admitting: Pharmacist

## 2014-11-13 DIAGNOSIS — I2699 Other pulmonary embolism without acute cor pulmonale: Secondary | ICD-10-CM

## 2014-11-13 DIAGNOSIS — C3490 Malignant neoplasm of unspecified part of unspecified bronchus or lung: Secondary | ICD-10-CM

## 2014-11-13 LAB — PROTIME-INR
INR: 2.1 (ref 2.00–3.50)
Protime: 25.2 Seconds — ABNORMAL HIGH (ref 10.6–13.4)

## 2014-11-13 LAB — POCT INR: INR: 2.1

## 2014-11-13 NOTE — Progress Notes (Signed)
Pt seen in clinic today INR=2.1 after holding a few days and resuming at lower dose Will take coumadin 2.'5mg'$  daily with '5mg'$  on Wed RTC in one week, if therapeutic will check again in month (12/21/14) with his flush appmt No med changes and his diet is sporadic Not much of an appetite still since finishing radiation Hoping it returns soon  Start taking coumadin 2.5 mg daily with '5mg'$  on Wednesday's. Recheck INR on 11/21/14; Lab at 3:00pm and coumadin clinic at 3:15pm.

## 2014-11-13 NOTE — Patient Instructions (Signed)
Start taking coumadin 2.5 mg daily with '5mg'$  on Wednesday's. Recheck INR on 11/21/14; Lab at 3:00pm and coumadin clinic at 3:15pm.

## 2014-11-21 ENCOUNTER — Ambulatory Visit
Admission: RE | Admit: 2014-11-21 | Discharge: 2014-11-21 | Disposition: A | Discharge status: Home / Self Care | Payer: 59 | Source: Ambulatory Visit | Attending: Radiation Oncology | Admitting: Radiation Oncology

## 2014-11-21 ENCOUNTER — Other Ambulatory Visit (HOSPITAL_BASED_OUTPATIENT_CLINIC_OR_DEPARTMENT_OTHER): Payer: 59

## 2014-11-21 ENCOUNTER — Encounter: Payer: Self-pay | Admitting: Radiation Oncology

## 2014-11-21 ENCOUNTER — Ambulatory Visit (HOSPITAL_BASED_OUTPATIENT_CLINIC_OR_DEPARTMENT_OTHER): Payer: 59 | Admitting: Pharmacist

## 2014-11-21 VITALS — BP 144/84 | HR 94 | Temp 98.9°F | Resp 16 | Ht 73.0 in | Wt 166.0 lb

## 2014-11-21 DIAGNOSIS — I2699 Other pulmonary embolism without acute cor pulmonale: Secondary | ICD-10-CM

## 2014-11-21 DIAGNOSIS — C3490 Malignant neoplasm of unspecified part of unspecified bronchus or lung: Secondary | ICD-10-CM

## 2014-11-21 LAB — PROTIME-INR
INR: 1.1 — ABNORMAL LOW (ref 2.00–3.50)
Protime: 13.2 Seconds (ref 10.6–13.4)

## 2014-11-21 LAB — POCT INR: INR: 1.1

## 2014-11-21 NOTE — Progress Notes (Signed)
Jaime Morris here for follow up.  He reports having a headache today that he is rating at a 2/10.  He says he does not have headaches very often.  He reports occasional problems with his balance.  He reports occasional blurry vision that started after radiation.  He reports having memory issues and gave the example of going into a room and forgeting why he went there.  He says this happens once a day.  He reports fatigue.  He has lost 8 lbs since 10/10/14 and is now taking Marinol.  He reports his appetite has improved.  He is not taking decadron.  He continues to have several small bumps on the left side of his face and the back of his head.    BP 144/84 mmHg  Pulse 94  Temp(Src) 98.9 F (37.2 C) (Oral)  Resp 16  Ht '6\' 1"'$  (1.854 m)  Wt 166 lb (75.297 kg)  BMI 21.91 kg/m2  SpO2 99%   Wt Readings from Last 3 Encounters:  11/21/14 166 lb (75.297 kg)  10/10/14 174 lb 8 oz (79.153 kg)  09/18/14 178 lb (80.74 kg)

## 2014-11-21 NOTE — Progress Notes (Signed)
INR well below goal today after large dose decrease.  Mr Jaime Morris states that his appetite has improved and is taking droperidol.  No bleeding/ bruising.  Will increase coumadin to 2.'5mg'$  M/F and '5mg'$  other days.  Will check PT/INR in 1 week to ensure INR rising toward goal.

## 2014-11-21 NOTE — Progress Notes (Signed)
Radiation Oncology         [phone] ________________________________  Name: Jaime Morris MRN: [id number]  Date: 11/21/2014  DOB: 1960/06/22  Follow-Up Visit Note  CC: Jaime Shelling, MD  Jaime Bears, MD   Diagnosis:   Jaime Morris is a 54 year old male presenting to clinic with extensive stage small cell lung cancer.  Interval Since Last Radiation:  6 months 2 days  Narrative:  The patient returns today for routine follow-up.he denies spitting up blood, but reports being short of breathe when going up steps. The patients had gained a cough since picking up smoking cigarettes.   Jaime Morris states that his appetite has improved and is taking droperidol.  No bleeding/ bruising.  No problems with significant headaches or visual issues.                    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  Meds: Current Outpatient Prescriptions  Medication Sig Dispense Refill  . albuterol (PROAIR HFA) 108 (90 BASE) MCG/ACT inhaler Inhale 2 puffs into the lungs every 4 (four) hours as needed for wheezing or shortness of breath.     . doxylamine, Sleep, (UNISOM) 25 MG tablet Take 50 mg by mouth at bedtime as needed for sleep.     Marland Kitchen dronabinol (MARINOL) 10 MG capsule Take 10 mg by mouth 2 (two) times daily before a meal.    . efavirenz-emtricitabine-tenofovir (ATRIPLA) 600-200-300 MG per tablet Take 1 tablet by mouth daily. 90 tablet 3  . fluticasone (FLONASE) 50 MCG/ACT nasal spray Place 2 sprays into both nostrils daily as needed for allergies.     . megestrol (MEGACE) 40 MG tablet Take 1 tablet (40 mg total) by mouth 2 (two) times daily. 60 tablet 5  . Melatonin 10 MG TABS Take 10 mg by mouth at bedtime as needed (sleep).    . Tiotropium Bromide Monohydrate (SPIRIVA RESPIMAT) 2.5 MCG/ACT AERS Inhale 2 puffs into the lungs daily. 3 Inhaler 2  . traZODone (DESYREL) 100 MG tablet Take 100 mg by mouth at bedtime.    . vitamin B-12 (CYANOCOBALAMIN) 500 MCG tablet Take  500 mcg by mouth daily.    Marland Kitchen warfarin (COUMADIN) 5 MG tablet Take 1 tablet (5 mg total) by mouth as directed. 30 tablet 2  . emollient (BIAFINE) cream Apply topically as needed.    . prochlorperazine (COMPAZINE) 10 MG tablet Take 1 tablet (10 mg total) by mouth every 6 (six) hours as needed for nausea or vomiting. (Patient not taking: Reported on 09/18/2014) 30 tablet 1  . varenicline (CHANTIX) 1 MG tablet Take 1 mg by mouth 2 (two) times daily.      No current facility-administered medications for this encounter.   Facility-Administered Medications Ordered in Other Encounters  Medication Dose Route Frequency Provider Last Rate Last Dose  . sodium chloride 0.9 % injection 10 mL  10 mL Intracatheter PRN Jaime Bears, MD   10 mL at 01/30/14 1451    Physical Findings: The patient is in no acute distress. Patient is alert and oriented.  height is '6\' 1"'$  (1.854 m) and weight is 166 lb (75.297 kg). His oral temperature is 98.9 F (37.2 C). His blood pressure is 144/84 and his pulse is 94. His respiration is 16 and oxygen saturation is 99%. . No significant changes.  Lungs are clear. Heart has regular rate and rhythm. No palpable cervical, supraclavicular, or axillary adenopathy. No secondary infection found in the oral cavity. Neurological  examination nonfocal. Patient's scalp is well-healed at this time.   Lab Findings: Lab Results  Component Value Date   WBC 6.2 10/03/2014   HGB 14.4 10/03/2014   HCT 43.7 10/03/2014   MCV 93.0 10/03/2014   PLT 242 10/03/2014    Radiographic Findings: No results found.  Impression:  Jaime Morris is a 54 year old male presenting to clinic with extensive stage small cell lung cancer, status post prophylactic cranial irradiation.  Plan:  CT scan scheduled for 12/03/2014. Continue to use Marinol to stimulate appetite. Encouraged to stop smoking cigarettes. Follow up with radiation oncology as needed, otherwise follow up with Dr. Julien Nordmann.   This  document serves as a record of services personally performed by Jaime Pray, MD. It was created on his behalf by Lenn Cal, a trained medical scribe. The creation of this record is based on the scribe's personal observations and the provider's statements to them. This document has been checked and approved by the attending provider.    ____________________________________   Blair Promise, PhD, MD

## 2014-11-28 ENCOUNTER — Ambulatory Visit (HOSPITAL_BASED_OUTPATIENT_CLINIC_OR_DEPARTMENT_OTHER): Payer: Self-pay | Admitting: Pharmacist

## 2014-11-28 ENCOUNTER — Other Ambulatory Visit (HOSPITAL_BASED_OUTPATIENT_CLINIC_OR_DEPARTMENT_OTHER): Payer: 59

## 2014-11-28 DIAGNOSIS — I2699 Other pulmonary embolism without acute cor pulmonale: Secondary | ICD-10-CM

## 2014-11-28 DIAGNOSIS — C3490 Malignant neoplasm of unspecified part of unspecified bronchus or lung: Secondary | ICD-10-CM

## 2014-11-28 LAB — PROTIME-INR
INR: 2.1 (ref 2.00–3.50)
PROTIME: 25.2 s — AB (ref 10.6–13.4)

## 2014-11-28 LAB — POCT INR: INR: 2.1

## 2014-11-28 NOTE — Progress Notes (Signed)
INR = 2.1     Goal 2-3 INR is within goal range. No complications of anticoagulation noted. He has a few scratches from a kitten he is in the process of adopting; the scratches have not bled much. Dronabinol is the only medication which has been added. He states this is helping with his appetite. He is considering changing back to Xarelto now that he has met his insurance deductible. He will let us know if he decides to do this. He will continue Coumadin 2.50m M/W and 517mother days.. We will recheck INR on 12/12/14; Lab at 10:30am and Coumadin clinic at 10:45am.  LiTheone MurdochPharmD

## 2014-12-12 ENCOUNTER — Other Ambulatory Visit: Payer: 59

## 2014-12-12 ENCOUNTER — Ambulatory Visit (HOSPITAL_BASED_OUTPATIENT_CLINIC_OR_DEPARTMENT_OTHER): Payer: 59 | Admitting: Pharmacist

## 2014-12-12 DIAGNOSIS — C3490 Malignant neoplasm of unspecified part of unspecified bronchus or lung: Secondary | ICD-10-CM

## 2014-12-12 DIAGNOSIS — I2699 Other pulmonary embolism without acute cor pulmonale: Secondary | ICD-10-CM

## 2014-12-12 LAB — PROTIME-INR
INR: 3.6 — AB (ref 2.00–3.50)
PROTIME: 43.2 s — AB (ref 10.6–13.4)

## 2014-12-12 LAB — POCT INR: INR: 3.6

## 2014-12-12 NOTE — Patient Instructions (Addendum)
Slightly decrease coumadin to '5mg'$  daily except 2.'5mg'$  MWF. Recheck INR on 12/21/14; Lab at 10:15am, coumadin clinic at 10:30am and Flush at 11am (already scheduled).

## 2014-12-12 NOTE — Progress Notes (Signed)
INR above goal today. No missed or extra coumadin doses. Pt has been out of trazodone for about the last week. He plans to refill. He has also been eating more vegetable containing meals lately. No unusual bruising. No bleeding noted. No s/s of clotting noted. Slightly decrease coumadin to '5mg'$  daily except 2.'5mg'$  MWF. Recheck INR on 12/21/14; Lab at 10:15am, coumadin clinic at 10:30am and Flush at 11am (already scheduled).

## 2014-12-13 ENCOUNTER — Other Ambulatory Visit: Payer: Self-pay | Admitting: *Deleted

## 2014-12-13 DIAGNOSIS — B20 Human immunodeficiency virus [HIV] disease: Secondary | ICD-10-CM

## 2014-12-13 MED ORDER — EFAVIRENZ-EMTRICITAB-TENOFOVIR 600-200-300 MG PO TABS: 1.0000 | ORAL_TABLET | Freq: Every day | ORAL | Status: DC

## 2014-12-14 ENCOUNTER — Other Ambulatory Visit: Payer: Self-pay | Admitting: *Deleted

## 2014-12-14 DIAGNOSIS — B20 Human immunodeficiency virus [HIV] disease: Secondary | ICD-10-CM

## 2014-12-14 MED ORDER — EFAVIRENZ-EMTRICITAB-TENOFOVIR 600-200-300 MG PO TABS: 1.0000 | ORAL_TABLET | Freq: Every day | ORAL | Status: DC

## 2014-12-21 ENCOUNTER — Ambulatory Visit (HOSPITAL_BASED_OUTPATIENT_CLINIC_OR_DEPARTMENT_OTHER): Payer: 59 | Admitting: Pharmacist

## 2014-12-21 ENCOUNTER — Other Ambulatory Visit (HOSPITAL_BASED_OUTPATIENT_CLINIC_OR_DEPARTMENT_OTHER): Payer: 59

## 2014-12-21 ENCOUNTER — Ambulatory Visit (HOSPITAL_BASED_OUTPATIENT_CLINIC_OR_DEPARTMENT_OTHER): Payer: 59

## 2014-12-21 VITALS — BP 146/80 | HR 84 | Temp 98.6°F | Resp 16

## 2014-12-21 DIAGNOSIS — C3411 Malignant neoplasm of upper lobe, right bronchus or lung: Secondary | ICD-10-CM | POA: Diagnosis not present

## 2014-12-21 DIAGNOSIS — I2699 Other pulmonary embolism without acute cor pulmonale: Secondary | ICD-10-CM

## 2014-12-21 DIAGNOSIS — Z95828 Presence of other vascular implants and grafts: Secondary | ICD-10-CM

## 2014-12-21 DIAGNOSIS — C3490 Malignant neoplasm of unspecified part of unspecified bronchus or lung: Secondary | ICD-10-CM

## 2014-12-21 LAB — PROTIME-INR
INR: 2.9 (ref 2.00–3.50)
PROTIME: 34.8 s — AB (ref 10.6–13.4)

## 2014-12-21 MED ORDER — SODIUM CHLORIDE 0.9 % IJ SOLN
10.0000 mL | INTRAMUSCULAR | Status: DC | PRN
  Administered 2014-12-21: 10 mL via INTRAVENOUS
  Filled 2014-12-21: qty 10

## 2014-12-21 MED ORDER — HEPARIN SOD (PORK) LOCK FLUSH 100 UNIT/ML IV SOLN
500.0000 [IU] | Freq: Once | INTRAVENOUS | Status: AC
  Administered 2014-12-21: 500 [IU] via INTRAVENOUS
  Filled 2014-12-21: qty 5

## 2014-12-21 NOTE — Progress Notes (Signed)
INR = 2.9  Goal 2-3 INR within goal range. No new medications. Patient states he is taking Marinol and is now eating a little more; no change in consumption of green, leafy foods. No complications of anticoagulation noted. He will continue Coumadin '5mg'$  daily except 2.'5mg'$  MWF. Recheck INR on 722/16; Lab at 10:30am, Coumadin clinic at 10:45am.  Theone Murdoch, PharmD

## 2014-12-21 NOTE — Patient Instructions (Signed)

## 2015-01-02 ENCOUNTER — Ambulatory Visit (HOSPITAL_BASED_OUTPATIENT_CLINIC_OR_DEPARTMENT_OTHER): Payer: 59

## 2015-01-02 ENCOUNTER — Ambulatory Visit (HOSPITAL_COMMUNITY)
Admission: RE | Admit: 2015-01-02 | Discharge: 2015-01-02 | Disposition: A | Discharge status: Home / Self Care | Payer: 59 | Source: Ambulatory Visit | Attending: Internal Medicine | Admitting: Internal Medicine

## 2015-01-02 ENCOUNTER — Other Ambulatory Visit (HOSPITAL_BASED_OUTPATIENT_CLINIC_OR_DEPARTMENT_OTHER): Payer: 59

## 2015-01-02 ENCOUNTER — Encounter (HOSPITAL_COMMUNITY): Payer: Self-pay

## 2015-01-02 VITALS — BP 158/93 | HR 96 | Temp 98.9°F

## 2015-01-02 DIAGNOSIS — C3411 Malignant neoplasm of upper lobe, right bronchus or lung: Secondary | ICD-10-CM

## 2015-01-02 DIAGNOSIS — C3490 Malignant neoplasm of unspecified part of unspecified bronchus or lung: Secondary | ICD-10-CM

## 2015-01-02 DIAGNOSIS — I2699 Other pulmonary embolism without acute cor pulmonale: Secondary | ICD-10-CM

## 2015-01-02 DIAGNOSIS — Z452 Encounter for adjustment and management of vascular access device: Secondary | ICD-10-CM | POA: Diagnosis not present

## 2015-01-02 DIAGNOSIS — Z95828 Presence of other vascular implants and grafts: Secondary | ICD-10-CM

## 2015-01-02 LAB — CBC WITH DIFFERENTIAL/PLATELET
BASO%: 0.1 % (ref 0.0–2.0)
BASOS ABS: 0 10*3/uL (ref 0.0–0.1)
EOS%: 1.5 % (ref 0.0–7.0)
Eosinophils Absolute: 0.1 10*3/uL (ref 0.0–0.5)
HCT: 40.9 % (ref 38.4–49.9)
HEMOGLOBIN: 14.2 g/dL (ref 13.0–17.1)
LYMPH%: 22.2 % (ref 14.0–49.0)
MCH: 32.8 pg (ref 27.2–33.4)
MCHC: 34.7 g/dL (ref 32.0–36.0)
MCV: 94.5 fL (ref 79.3–98.0)
MONO#: 0.6 10*3/uL (ref 0.1–0.9)
MONO%: 7.8 % (ref 0.0–14.0)
NEUT#: 5.3 10*3/uL (ref 1.5–6.5)
NEUT%: 68.4 % (ref 39.0–75.0)
PLATELETS: 208 10*3/uL (ref 140–400)
RBC: 4.33 10*6/uL (ref 4.20–5.82)
RDW: 14.1 % (ref 11.0–14.6)
WBC: 7.8 10*3/uL (ref 4.0–10.3)
lymph#: 1.7 10*3/uL (ref 0.9–3.3)

## 2015-01-02 LAB — COMPREHENSIVE METABOLIC PANEL (CC13)
ALT: 27 U/L (ref 0–55)
AST: 35 U/L — AB (ref 5–34)
Albumin: 3.7 g/dL (ref 3.5–5.0)
Alkaline Phosphatase: 136 U/L (ref 40–150)
Anion Gap: 7 mEq/L (ref 3–11)
BUN: 4.9 mg/dL — ABNORMAL LOW (ref 7.0–26.0)
CALCIUM: 9.5 mg/dL (ref 8.4–10.4)
CO2: 25 meq/L (ref 22–29)
Chloride: 107 mEq/L (ref 98–109)
Creatinine: 0.8 mg/dL (ref 0.7–1.3)
EGFR: >90 mL/min/{1.73_m2} (ref >90)
Glucose: 111 mg/dl (ref 70–140)
Potassium: 4.1 mEq/L (ref 3.5–5.1)
Sodium: 139 mEq/L (ref 136–145)
Total Bilirubin: 0.57 mg/dL (ref 0.20–1.20)
Total Protein: 7 g/dL (ref 6.4–8.3)

## 2015-01-02 MED ORDER — IOHEXOL 300 MG/ML  SOLN
100.0000 mL | Freq: Once | INTRAMUSCULAR | Status: AC | PRN
  Administered 2015-01-02: 100 mL via INTRAVENOUS

## 2015-01-02 MED ORDER — SODIUM CHLORIDE 0.9 % IJ SOLN
10.0000 mL | INTRAMUSCULAR | Status: DC | PRN
  Administered 2015-01-02: 10 mL via INTRAVENOUS
  Filled 2015-01-02: qty 10

## 2015-01-02 MED ORDER — HEPARIN SOD (PORK) LOCK FLUSH 100 UNIT/ML IV SOLN
500.0000 [IU] | Freq: Once | INTRAVENOUS | Status: AC
  Administered 2015-01-02: 500 [IU] via INTRAVENOUS
  Filled 2015-01-02: qty 5

## 2015-01-02 NOTE — Patient Instructions (Signed)

## 2015-01-04 ENCOUNTER — Ambulatory Visit (HOSPITAL_BASED_OUTPATIENT_CLINIC_OR_DEPARTMENT_OTHER): Payer: 59 | Admitting: Pharmacist

## 2015-01-04 ENCOUNTER — Other Ambulatory Visit: Payer: 59

## 2015-01-04 DIAGNOSIS — I2699 Other pulmonary embolism without acute cor pulmonale: Secondary | ICD-10-CM

## 2015-01-04 DIAGNOSIS — C3490 Malignant neoplasm of unspecified part of unspecified bronchus or lung: Secondary | ICD-10-CM

## 2015-01-04 LAB — PROTIME-INR
INR: 1.9 — AB (ref 2.00–3.50)
Protime: 22.8 Seconds — ABNORMAL HIGH (ref 10.6–13.4)

## 2015-01-04 LAB — POCT INR: INR: 1.9

## 2015-01-04 NOTE — Progress Notes (Signed)
Pt seen in clinic today INR=1.9 (goal 2-3) Pt reports he ate more greens last week while visiting [place] He has no new meds or changes to current meds No s/s bleeding or clotting  Continue Coumadin '5mg'$  daily except 2.'5mg'$  MWF. Recheck INR on 01/21/15; Lab at 10:00am, Coumadin clinic at 10:15am.

## 2015-01-04 NOTE — Patient Instructions (Signed)
Continue Coumadin '5mg'$  daily except 2.'5mg'$  MWF. Recheck INR on 01/21/15; Lab at 10:00am, Coumadin clinic at 10:15am.

## 2015-01-07 ENCOUNTER — Telehealth (HOSPITAL_COMMUNITY): Payer: Self-pay | Admitting: Radiology

## 2015-01-09 ENCOUNTER — Telehealth: Payer: Self-pay | Admitting: Internal Medicine

## 2015-01-09 ENCOUNTER — Ambulatory Visit (HOSPITAL_BASED_OUTPATIENT_CLINIC_OR_DEPARTMENT_OTHER): Payer: 59 | Admitting: Internal Medicine

## 2015-01-09 ENCOUNTER — Encounter: Payer: Self-pay | Admitting: Internal Medicine

## 2015-01-09 VITALS — BP 142/79 | HR 92 | Temp 98.3°F | Resp 18

## 2015-01-09 DIAGNOSIS — C3411 Malignant neoplasm of upper lobe, right bronchus or lung: Secondary | ICD-10-CM | POA: Diagnosis not present

## 2015-01-09 DIAGNOSIS — C3490 Malignant neoplasm of unspecified part of unspecified bronchus or lung: Secondary | ICD-10-CM

## 2015-01-09 DIAGNOSIS — I2699 Other pulmonary embolism without acute cor pulmonale: Secondary | ICD-10-CM | POA: Diagnosis not present

## 2015-01-09 NOTE — Patient Instructions (Signed)
Smoking Cessation Quitting smoking is important to your health and has many advantages. However, it is not always easy to quit since nicotine is a very addictive drug. Oftentimes, people try 3 times or more before being able to quit. This document explains the best ways for you to prepare to quit smoking. Quitting takes hard work and a lot of effort, but you can do it. ADVANTAGES OF QUITTING SMOKING  You will live longer, feel better, and live better.  Your body will feel the impact of quitting smoking almost immediately.  Within 20 minutes, blood pressure decreases. Your pulse returns to its normal level.  After 8 hours, carbon monoxide levels in the blood return to normal. Your oxygen level increases.  After 24 hours, the chance of having a heart attack starts to decrease. Your breath, hair, and body stop smelling like smoke.  After 48 hours, damaged nerve endings begin to recover. Your sense of taste and smell improve.  After 72 hours, the body is virtually free of nicotine. Your bronchial tubes relax and breathing becomes easier.  After 2 to 12 weeks, lungs can hold more air. Exercise becomes easier and circulation improves.  The risk of having a heart attack, stroke, cancer, or lung disease is greatly reduced.  After 1 year, the risk of coronary heart disease is cut in half.  After 5 years, the risk of stroke falls to the same as a nonsmoker.  After 10 years, the risk of lung cancer is cut in half and the risk of other cancers decreases significantly.  After 15 years, the risk of coronary heart disease drops, usually to the level of a nonsmoker.  If you are pregnant, quitting smoking will improve your chances of having a healthy baby.  The people you live with, especially any children, will be healthier.  You will have extra money to spend on things other than cigarettes. QUESTIONS TO THINK ABOUT BEFORE ATTEMPTING TO QUIT You may want to talk about your answers with your  health care provider.  Why do you want to quit?  If you tried to quit in the past, what helped and what did not?  What will be the most difficult situations for you after you quit? How will you plan to handle them?  Who can help you through the tough times? Your family? Friends? A health care provider?  What pleasures do you get from smoking? What ways can you still get pleasure if you quit? Here are some questions to ask your health care provider:  How can you help me to be successful at quitting?  What medicine do you think would be best for me and how should I take it?  What should I do if I need more help?  What is smoking withdrawal like? How can I get information on withdrawal? GET READY  Set a quit date.  Change your environment by getting rid of all cigarettes, ashtrays, matches, and lighters in your home, car, or work. Do not let people smoke in your home.  Review your past attempts to quit. Think about what worked and what did not. GET SUPPORT AND ENCOURAGEMENT You have a better chance of being successful if you have help. You can get support in many ways.  Tell your family, friends, and coworkers that you are going to quit and need their support. Ask them not to smoke around you.  Get individual, group, or telephone counseling and support. Programs are available at local hospitals and health centers. Call   your local health department for information about programs in your area.  Spiritual beliefs and practices may help some smokers quit.  Download a "quit meter" on your computer to keep track of quit statistics, such as how long you have gone without smoking, cigarettes not smoked, and money saved.  Get a self-help book about quitting smoking and staying off tobacco. LEARN NEW SKILLS AND BEHAVIORS  Distract yourself from urges to smoke. Talk to someone, go for a walk, or occupy your time with a task.  Change your normal routine. Take a different route to work.  Drink tea instead of coffee. Eat breakfast in a different place.  Reduce your stress. Take a hot bath, exercise, or read a book.  Plan something enjoyable to do every day. Reward yourself for not smoking.  Explore interactive web-based programs that specialize in helping you quit. GET MEDICINE AND USE IT CORRECTLY Medicines can help you stop smoking and decrease the urge to smoke. Combining medicine with the above behavioral methods and support can greatly increase your chances of successfully quitting smoking.  Nicotine replacement therapy helps deliver nicotine to your body without the negative effects and risks of smoking. Nicotine replacement therapy includes nicotine gum, lozenges, inhalers, nasal sprays, and skin patches. Some may be available over-the-counter and others require a prescription.  Antidepressant medicine helps people abstain from smoking, but how this works is unknown. This medicine is available by prescription.  Nicotinic receptor partial agonist medicine simulates the effect of nicotine in your brain. This medicine is available by prescription. Ask your health care provider for advice about which medicines to use and how to use them based on your health history. Your health care provider will tell you what side effects to look out for if you choose to be on a medicine or therapy. Carefully read the information on the package. Do not use any other product containing nicotine while using a nicotine replacement product.  RELAPSE OR DIFFICULT SITUATIONS Most relapses occur within the first 3 months after quitting. Do not be discouraged if you start smoking again. Remember, most people try several times before finally quitting. You may have symptoms of withdrawal because your body is used to nicotine. You may crave cigarettes, be irritable, feel very hungry, cough often, get headaches, or have difficulty concentrating. The withdrawal symptoms are only temporary. They are strongest  when you first quit, but they will go away within 10-14 days. To reduce the chances of relapse, try to:  Avoid drinking alcohol. Drinking lowers your chances of successfully quitting.  Reduce the amount of caffeine you consume. Once you quit smoking, the amount of caffeine in your body increases and can give you symptoms, such as a rapid heartbeat, sweating, and anxiety.  Avoid smokers because they can make you want to smoke.  Do not let weight gain distract you. Many smokers will gain weight when they quit, usually less than 10 pounds. Eat a healthy diet and stay active. You can always lose the weight gained after you quit.  Find ways to improve your mood other than smoking. FOR MORE INFORMATION  www.smokefree.gov  Document Released: 05/26/2001 Document Revised: 10/16/2013 Document Reviewed: 09/10/2011 [employer] Patient Information 2015 [employer]. This information is not intended to replace advice given to you by your health care provider. Make sure you discuss any questions you have with your health care provider.  

## 2015-01-09 NOTE — Telephone Encounter (Signed)
returned call and confirmed that radiology will contact pt with ct appt

## 2015-01-09 NOTE — Progress Notes (Signed)
[place] [phone]   [phone]) [phone]  OFFICE PROGRESS NOTE  Irven Shelling, MD [place] [address] [place] [address]  DIAGNOSIS:  1) Small cell carcinoma of lung, Extensive stage diagnosed in June 2015. 2) incidental diagnosis of pulmonary embolism on CT scan of the chest performed on 04/18/2014. Primary site: Lung (Right)  Staging method: AJCC 7th Edition  Clinical: Stage IV (T1a, N1, M1b) signed by Curt Bears, MD on 12/07/2013 3:19 PM  Summary: Stage IV (T1a, N1, M1b)   PRIOR THERAPY:  1)  Systemic chemotherapy with carboplatin for AUC of 5 given on day 1, etoposide 100 mg/m2 given on days 1, 2 an3 with neulast support on day 4. Status post 6 cycles.  2) Lovenox 120 mg subcutaneously started 04/18/2014. 3) status post whole brain irradiation under the care of Dr. Sondra Come completed 06/21/2014  CURRENT THERAPY:  1) Observation for the small cell lung cancer. 2) initial treatment with Xarelto 20 mg by mouth daily. First dose of treatment on 05/03/2014. This was switched on 10/01/2014 to Coumadin and followed by the Coumadin clinic at the [facility].  DISEASE STAGE:  Small cell carcinoma of lung, Extensive stage  Primary site: Lung (Right)  Staging method: AJCC 7th Edition  Clinical: Stage IV (T1a, N1, M1b) signed by Curt Bears, MD on 12/07/2013 3:19 PM  Summary: Stage IV (T1a, N1, M1b)  CHEMOTHERAPY INTENT: pallative  CURRENT # OF CHEMOTHERAPY CYCLES: 0 CURRENT ANTIEMETICS: Zofran, dexamethasone and compazine  CURRENT SMOKING STATUS: Former smoker, quit 12/18/2013  ORAL CHEMOTHERAPY AND CONSENT: n/a  CURRENT BISPHOSPHONATES USE: none  PAIN MANAGEMENT: Lortab 5/325 mg  NARCOTICS INDUCED CONSTIPATION: none  LIVING WILL AND CODE STATUS: ?   INTERVAL HISTORY: Jaime Morris 54 y.o. male returns to the clinic today for followup visit. The patient is feeling fine today with no specific complaints except for mild  fatigue.  He denied having any significant weight loss or night sweats. He has no chest pain, shortness of breath, cough or hemoptysis. He has no nausea or vomiting. No fever or chills. He is currently on Coumadin for the history of pulmonary embolism and followed by the Coumadin clinic for now but this is expected to be close to in the next few months. The patient has repeat CT scan of the chest, abdomen and pelvis performed recently and he is here for evaluation and discussion of his scan results.  MEDICAL HISTORY: Past Medical History  Diagnosis Date  . HIV infection     DR. [name]  . Hyperlipidemia   . Insomnia   . Lung nodule, multiple 10/24/13    CT CHEST W/O...DR. [name]  . Gynecomastia, male 10/16/13    MILD RIGHT SIDED PER RADIOLOGY STUDY  . COPD (chronic obstructive pulmonary disease)     PFT'S 10/26/13 @ [facility]  . History of depression   . History of pneumonia   . Small cell lung cancer   . Collapsed lung     d/t biopsy, right  . 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  MEDICATIONS:  Current Outpatient Prescriptions  Medication Sig Dispense Refill  . albuterol (PROAIR HFA) 108 (90 BASE) MCG/ACT inhaler Inhale 2 puffs into the lungs every 4 (four) hours as needed for wheezing or shortness of breath.     . doxylamine, Sleep, (UNISOM) 25 MG tablet Take 50 mg by mouth at  bedtime as needed for sleep.     Marland Kitchen dronabinol (MARINOL) 10 MG capsule Take 10 mg by mouth 2 (two) times daily before a meal.    . efavirenz-emtricitabine-tenofovir (ATRIPLA) 600-200-300 MG per tablet Take 1 tablet by mouth daily. 90 tablet 3  . emollient (BIAFINE) cream Apply topically as needed.    . fluticasone (FLONASE) 50 MCG/ACT nasal spray Place 2 sprays into both nostrils daily as needed for allergies.     . Melatonin 10 MG TABS Take 10 mg by mouth at bedtime as needed (sleep).    . prochlorperazine (COMPAZINE) 10 MG tablet Take 1  tablet (10 mg total) by mouth every 6 (six) hours as needed for nausea or vomiting. (Patient not taking: Reported on 09/18/2014) 30 tablet 1  . Tiotropium Bromide Monohydrate (SPIRIVA RESPIMAT) 2.5 MCG/ACT AERS Inhale 2 puffs into the lungs daily. 3 Inhaler 2  . traZODone (DESYREL) 100 MG tablet Take 100 mg by mouth at bedtime.    . varenicline (CHANTIX) 1 MG tablet Take 1 mg by mouth 2 (two) times daily.     . vitamin B-12 (CYANOCOBALAMIN) 500 MCG tablet Take 500 mcg by mouth daily.    Marland Kitchen warfarin (COUMADIN) 5 MG tablet Take 1 tablet (5 mg total) by mouth as directed. 30 tablet 2   No current facility-administered medications for this visit.   Facility-Administered Medications Ordered in Other Visits  Medication Dose Route Frequency Provider Last Rate Last Dose  . sodium chloride 0.9 % injection 10 mL  10 mL Intracatheter PRN Curt Bears, MD   10 mL at 01/30/14 1451    SURGICAL HISTORY:  Past Surgical History  Procedure Laterality Date  . Foot surgery      BUNIONECTOMY  . Back surgery      L 4-5 FUSION WITH SCREW  . Colonoscopy  UTD  . Portacath placement N/A 12/08/2013    Procedure: INSERTION PORT-A-CATH;  Surgeon: Grace Isaac, MD;  Location: [facility];  Service: Thoracic;  Laterality: N/A;    REVIEW OF SYSTEMS:  Constitutional: positive for fatigue Eyes: negative Ears, nose, mouth, throat, and face: negative Respiratory: negative Cardiovascular: negative Gastrointestinal: negative Genitourinary:negative Integument/breast: negative Hematologic/lymphatic: negative Musculoskeletal:negative Neurological: negative Behavioral/Psych: negative Endocrine: negative Allergic/Immunologic: negative   PHYSICAL EXAMINATION: General appearance: alert, cooperative, fatigued and no distress Head: Normocephalic, without obvious abnormality, atraumatic Neck: no adenopathy, no JVD, supple, symmetrical, trachea midline and thyroid not enlarged, symmetric, no tenderness/mass/nodules Lymph  nodes: Cervical, supraclavicular, and axillary nodes normal. Resp: clear to auscultation bilaterally Back: symmetric, no curvature. ROM normal. No CVA tenderness. Cardio: regular rate and rhythm, S1, S2 normal, no murmur, click, rub or gallop GI: soft, non-tender; bowel sounds normal; no masses,  no organomegaly Extremities: extremities normal, atraumatic, no cyanosis or edema Neurologic: Alert and oriented X 3, normal strength and tone. Normal symmetric reflexes. Normal coordination and gait  ECOG PERFORMANCE STATUS: 1 - Symptomatic but completely ambulatory  There were no vitals taken for this visit.  LABORATORY DATA: Lab Results  Component Value Date   WBC 7.8 01/02/2015   HGB 14.2 01/02/2015   HCT 40.9 01/02/2015   MCV 94.5 01/02/2015   PLT 208 01/02/2015      Chemistry      Component Value Date/Time   NA 139 01/02/2015 1028   NA 134* 01/22/2014 0955   K 4.1 01/02/2015 1028   K 4.2 01/22/2014 0955   CL 102 01/22/2014 0955   CO2 25 01/02/2015 1028   CO2 23 01/22/2014 0955  BUN 4.9* 01/02/2015 1028   BUN 12 01/22/2014 0955   CREATININE 0.8 01/02/2015 1028   CREATININE 0.97 01/22/2014 0955   CREATININE 0.79 10/23/2013 1112      Component Value Date/Time   CALCIUM 9.5 01/02/2015 1028   CALCIUM 9.6 01/22/2014 0955   ALKPHOS 136 01/02/2015 1028   ALKPHOS 138* 01/22/2014 0955   AST 35* 01/02/2015 1028   AST 23 01/22/2014 0955   ALT 27 01/02/2015 1028   ALT 28 01/22/2014 0955   BILITOT 0.57 01/02/2015 1028   BILITOT 0.5 01/22/2014 0955       RADIOGRAPHIC STUDIES: Ct Chest W Contrast  01/02/2015   CLINICAL DATA:  Restaging small cell lung cancer.  EXAM: CT CHEST, ABDOMEN, AND PELVIS WITH CONTRAST  TECHNIQUE: Multidetector CT imaging of the chest, abdomen and pelvis was performed following the standard protocol during bolus administration of intravenous contrast.  CONTRAST:  152m OMNIPAQUE IOHEXOL 300 MG/ML  SOLN  COMPARISON:  10/03/2014  FINDINGS: CT CHEST FINDINGS   Mediastinum/Nodes: Stable 1 cm right hilar node and soft tissue thickening in the region of the right hilum inferiorly measuring 1.8 x 1.5 cm image 40.  Trace fluid in the superior pericardial recess. Great vessels are normal in caliber. Heart size is normal. Left-sided Port-A-Cath in place with tip in the mid SVC.  Lungs/Pleura: Diffuse severe emphysematous change reidentified. Subpleural nodules measuring up to 2 mm as well as scattered 2 mm nodules including representative nodules in the right lower lobe image 39 and 38, are stable. Irregular soft tissue thickening extending from the right hilum to the right middle lobe with probable associated compressive atelectasis is noted. However, there are progressive areas of ground-glass airspace opacity within the right middle lobe and right upper lobe in a somewhat wedge-shaped configuration.  Curvilinear lingular scarring or atelectasis is noted as well as subjacent 2-3 mm pulmonary parenchymal nodules reidentified. No pleural effusion.  Upper abdomen: Please see dedicated report below.  Musculoskeletal: No acute osseous abnormality or lytic or sclerotic osseous lesion in the chest.  CT ABDOMEN AND PELVIS FINDINGS  Lower chest:  Please see dedicated report above.  Hepatobiliary: Probable focal fat reidentified adjacent to the falciform ligament. No focal hepatic abnormality otherwise. Gallbladder decompressed but unremarkable.  Pancreas: Normal  Spleen: Normal  Adrenals/Urinary Tract: Adrenal glands are normal. Stable 3 mm too small to characterize left renal cortical hypodense lesion image 75. No hydronephrosis. No radiopaque renal or ureteral calculus. Bladder decompressed but unremarkable.  Stomach/Bowel: No bowel wall thickening or focal segmental dilatation is identified. Stomach appears normal.  Vascular/Lymphatic: Mild atheromatous aortic calcification without aneurysm.  Other: No free air or fluid.  Musculoskeletal: Fusion hardware spanning L4-L5 reidentified  with interbody spacers at L5-S1 and L4-L5. Stable degree of anterolisthesis of L5 on S1.  IMPRESSION: Interval development of increased patchy ground-glass airspace opacity within the right upper lobe and right middle lobe with a configuration that may be seen with radiation treatment or potentially infectious pneumonitis although this indicates a process below the resolution of CT and recurrence could appear similar.  No subjective change in right hilar lymph node and right infrahilar irregular soft tissue compatible with treated tumor.  No acute abnormality within the abdomen or pelvis. No evidence for intra-abdominal or pelvic metastatic disease.   Electronically Signed   By: GConchita ParisM.D.   On: 01/02/2015 14:45   Ct Abdomen Pelvis W Contrast  01/02/2015   CLINICAL DATA:  Restaging small cell lung cancer.  EXAM: CT CHEST,  ABDOMEN, AND PELVIS WITH CONTRAST  TECHNIQUE: Multidetector CT imaging of the chest, abdomen and pelvis was performed following the standard protocol during bolus administration of intravenous contrast.  CONTRAST:  185m OMNIPAQUE IOHEXOL 300 MG/ML  SOLN  COMPARISON:  10/03/2014  FINDINGS: CT CHEST FINDINGS  Mediastinum/Nodes: Stable 1 cm right hilar node and soft tissue thickening in the region of the right hilum inferiorly measuring 1.8 x 1.5 cm image 40.  Trace fluid in the superior pericardial recess. Great vessels are normal in caliber. Heart size is normal. Left-sided Port-A-Cath in place with tip in the mid SVC.  Lungs/Pleura: Diffuse severe emphysematous change reidentified. Subpleural nodules measuring up to 2 mm as well as scattered 2 mm nodules including representative nodules in the right lower lobe image 39 and 38, are stable. Irregular soft tissue thickening extending from the right hilum to the right middle lobe with probable associated compressive atelectasis is noted. However, there are progressive areas of ground-glass airspace opacity within the right middle lobe and  right upper lobe in a somewhat wedge-shaped configuration.  Curvilinear lingular scarring or atelectasis is noted as well as subjacent 2-3 mm pulmonary parenchymal nodules reidentified. No pleural effusion.  Upper abdomen: Please see dedicated report below.  Musculoskeletal: No acute osseous abnormality or lytic or sclerotic osseous lesion in the chest.  CT ABDOMEN AND PELVIS FINDINGS  Lower chest:  Please see dedicated report above.  Hepatobiliary: Probable focal fat reidentified adjacent to the falciform ligament. No focal hepatic abnormality otherwise. Gallbladder decompressed but unremarkable.  Pancreas: Normal  Spleen: Normal  Adrenals/Urinary Tract: Adrenal glands are normal. Stable 3 mm too small to characterize left renal cortical hypodense lesion image 75. No hydronephrosis. No radiopaque renal or ureteral calculus. Bladder decompressed but unremarkable.  Stomach/Bowel: No bowel wall thickening or focal segmental dilatation is identified. Stomach appears normal.  Vascular/Lymphatic: Mild atheromatous aortic calcification without aneurysm.  Other: No free air or fluid.  Musculoskeletal: Fusion hardware spanning L4-L5 reidentified with interbody spacers at L5-S1 and L4-L5. Stable degree of anterolisthesis of L5 on S1.  IMPRESSION: Interval development of increased patchy ground-glass airspace opacity within the right upper lobe and right middle lobe with a configuration that may be seen with radiation treatment or potentially infectious pneumonitis although this indicates a process below the resolution of CT and recurrence could appear similar.  No subjective change in right hilar lymph node and right infrahilar irregular soft tissue compatible with treated tumor.  No acute abnormality within the abdomen or pelvis. No evidence for intra-abdominal or pelvic metastatic disease.   Electronically Signed   By: GConchita ParisM.D.   On: 01/02/2015 14:45   ASSESSMENT AND PLAN:  This is a very pleasant 54years  old [place] male recently diagnosed with:  1) Extensive stage small cell lung cancer: He is status post 6 cycles of systemic chemotherapy with carboplatin and etoposide. This was followed by prophylactic cranial irradiation.  Has been on observation and the recent CT scan of the chest, abdomen and pelvis showed no evidence for disease progression. I discussed the scan results with the patient.  I recommended for him to continue on observation with repeat CT scan of the chest, abdomen and pelvis in 4 months for reevaluation of his disease.   2) right lower lobe pulmonary embolus: He is currently on Coumadin because of cost issues. He is followed by the Coumadin clinic and his dose will be adjusted as needed. He would follow-up with his primary care physician and the  Coumadin clinic in the office once the Coumadin clinic at the [facility] is closed.  He will also continue with Port-A-Cath flush every 6 weeks.  He was advised to call immediately if he has any concerning symptoms in the interval. The patient voices understanding of current disease status and treatment options and is in agreement with the current care plan.  All questions were answered. The patient knows to call the clinic with any problems, questions or concerns. We can certainly see the patient much sooner if necessary.  Disclaimer: This note was dictated with voice recognition software. Similar sounding words can inadvertently be transcribed and may not be corrected upon review.

## 2015-01-09 NOTE — Telephone Encounter (Signed)
Gave and printed appt sched and avs for pt for Aug thru [facility]

## 2015-01-21 ENCOUNTER — Other Ambulatory Visit (HOSPITAL_BASED_OUTPATIENT_CLINIC_OR_DEPARTMENT_OTHER): Payer: 59

## 2015-01-21 ENCOUNTER — Ambulatory Visit (HOSPITAL_BASED_OUTPATIENT_CLINIC_OR_DEPARTMENT_OTHER): Payer: 59 | Admitting: Pharmacist

## 2015-01-21 DIAGNOSIS — C3490 Malignant neoplasm of unspecified part of unspecified bronchus or lung: Secondary | ICD-10-CM

## 2015-01-21 DIAGNOSIS — I2699 Other pulmonary embolism without acute cor pulmonale: Secondary | ICD-10-CM

## 2015-01-21 LAB — PROTIME-INR
INR: 4.3 — ABNORMAL HIGH (ref 2.00–3.50)
Protime: 51.6 Seconds — ABNORMAL HIGH (ref 10.6–13.4)

## 2015-01-21 LAB — POCT INR
INR: 4.3
INR: 4.3

## 2015-01-21 NOTE — Progress Notes (Signed)
INR above goal today.  No changes in medications.  No bleeding or bruising.  Jaime Morris states that his appetite has not improved.  He said that it took several days to eat a sub from [employer] this weekend.  I suggested that he gets some shakes such as boost or ensure to help supplement his diet.  He stated he will start this week.  This lack of appetites probably attributes to increase INR.  Will hold coumadin x 2 days then resume current dose of 2.'5mg'$  MWF and '5mg'$  other days since will try drinking nutritional supplement.  Will check PT/INR in 1 week.

## 2015-01-28 ENCOUNTER — Other Ambulatory Visit (HOSPITAL_BASED_OUTPATIENT_CLINIC_OR_DEPARTMENT_OTHER): Payer: 59

## 2015-01-28 ENCOUNTER — Ambulatory Visit (HOSPITAL_BASED_OUTPATIENT_CLINIC_OR_DEPARTMENT_OTHER): Payer: 59 | Admitting: Pharmacist

## 2015-01-28 DIAGNOSIS — C3490 Malignant neoplasm of unspecified part of unspecified bronchus or lung: Secondary | ICD-10-CM

## 2015-01-28 DIAGNOSIS — I2699 Other pulmonary embolism without acute cor pulmonale: Secondary | ICD-10-CM

## 2015-01-28 LAB — PROTIME-INR
INR: 1.2 — ABNORMAL LOW (ref 2.00–3.50)
Protime: 14.4 Seconds — ABNORMAL HIGH (ref 10.6–13.4)

## 2015-01-28 LAB — POCT INR: INR: 1.2

## 2015-01-28 NOTE — Progress Notes (Addendum)
INR = 1.2 Pt held 2 doses of Coumadin as instructed last week.  He also started a protein shake ([employer]) and he is drinking this 2-3 times daily.  He did try [employer] also but does not plan to continue this. Premier Protein shakes have 25% daily recommended vit K content in each drink. He had increased weakness until his protein intake increased.  He states he has more energy now. No sudden chest pain.  SOB usual.  Nothing different in past week. INR subtherapeutic now.  He held 2 doses of Coumadin and has had increase in vit K intake in past week w/ protein shakes. He will take Coumadin 5 mg today then go back to 5 mg/day except 2.5 mg MWF and will continue protein shakes.  He understands he should drink them consistently from week to week to maintain same amt of vit K in diet. Return in 1 week for next protime. Clarified duration of anticoag: indefinite. Pt aware after Sept 30th he will likely need to f/u w/ PCP for anticoag management per Dr. Julien Nordmann. Kennith Center, Pharm.D., CPP 01/28/2015'@11'$ :07 AM

## 2015-02-05 ENCOUNTER — Other Ambulatory Visit (HOSPITAL_BASED_OUTPATIENT_CLINIC_OR_DEPARTMENT_OTHER): Payer: 59

## 2015-02-05 ENCOUNTER — Ambulatory Visit (HOSPITAL_BASED_OUTPATIENT_CLINIC_OR_DEPARTMENT_OTHER): Payer: 59 | Admitting: Pharmacist

## 2015-02-05 DIAGNOSIS — I2699 Other pulmonary embolism without acute cor pulmonale: Secondary | ICD-10-CM

## 2015-02-05 DIAGNOSIS — C3490 Malignant neoplasm of unspecified part of unspecified bronchus or lung: Secondary | ICD-10-CM

## 2015-02-05 LAB — POCT INR: INR: 2.2

## 2015-02-05 LAB — PROTIME-INR
INR: 2.2 (ref 2.00–3.50)
PROTIME: 26.4 s — AB (ref 10.6–13.4)

## 2015-02-05 NOTE — Progress Notes (Signed)
INR back to goal of 2-3.  No changes in meds.  No bleeding/bruising.  Jaime Morris is drinking 1-2 shakes/day.  He is feeling like he has a little more energy.  Will continue current coumadin dose of '5mg'$  daily and 2.'5mg'$  MWF and check PT/INR next week with scheduled flush appt.

## 2015-02-13 ENCOUNTER — Ambulatory Visit (HOSPITAL_BASED_OUTPATIENT_CLINIC_OR_DEPARTMENT_OTHER): Payer: 59 | Admitting: Pharmacist

## 2015-02-13 ENCOUNTER — Ambulatory Visit (HOSPITAL_BASED_OUTPATIENT_CLINIC_OR_DEPARTMENT_OTHER): Payer: 59

## 2015-02-13 ENCOUNTER — Other Ambulatory Visit (HOSPITAL_BASED_OUTPATIENT_CLINIC_OR_DEPARTMENT_OTHER): Payer: 59

## 2015-02-13 VITALS — BP 127/75 | HR 82 | Temp 98.8°F | Resp 20

## 2015-02-13 DIAGNOSIS — I2699 Other pulmonary embolism without acute cor pulmonale: Secondary | ICD-10-CM

## 2015-02-13 DIAGNOSIS — C3411 Malignant neoplasm of upper lobe, right bronchus or lung: Secondary | ICD-10-CM

## 2015-02-13 DIAGNOSIS — C3490 Malignant neoplasm of unspecified part of unspecified bronchus or lung: Secondary | ICD-10-CM

## 2015-02-13 LAB — PROTIME-INR
INR: 1.9 — AB (ref 2.00–3.50)
Protime: 22.8 Seconds — ABNORMAL HIGH (ref 10.6–13.4)

## 2015-02-13 LAB — POCT INR: INR: 1.9

## 2015-02-13 MED ORDER — HEPARIN SOD (PORK) LOCK FLUSH 100 UNIT/ML IV SOLN
500.0000 [IU] | Freq: Once | INTRAVENOUS | Status: AC
  Administered 2015-02-13: 500 [IU] via INTRAVENOUS
  Filled 2015-02-13: qty 5

## 2015-02-13 MED ORDER — SODIUM CHLORIDE 0.9 % IJ SOLN
10.0000 mL | INTRAMUSCULAR | Status: DC | PRN
  Administered 2015-02-13: 10 mL via INTRAVENOUS
  Filled 2015-02-13: qty 10

## 2015-02-13 NOTE — Progress Notes (Signed)
INR = 1.9 on Coumadin 5 mg daily except 2.5 mg MWF No missed doses. He ran out of his protein shakes but just purchased more. C/O indigestion yesterday after forcing himself to eat 2 hot dogs.  He took 2 TUMS and the indigestion subsided. No CP/SOB. INR essentially at goal.  He was at goal last week. Continue current dose of Coumadin w/o change. Return in 2 weeks. Pt plans to contact his PCP re: managing INR's after 03/15/15. Kennith Center, Pharm.D., CPP 02/13/2015'@10'$ :55 AM

## 2015-02-27 ENCOUNTER — Other Ambulatory Visit (HOSPITAL_BASED_OUTPATIENT_CLINIC_OR_DEPARTMENT_OTHER): Payer: 59

## 2015-02-27 ENCOUNTER — Ambulatory Visit (HOSPITAL_BASED_OUTPATIENT_CLINIC_OR_DEPARTMENT_OTHER): Payer: 59 | Admitting: Pharmacist

## 2015-02-27 DIAGNOSIS — I2699 Other pulmonary embolism without acute cor pulmonale: Secondary | ICD-10-CM | POA: Diagnosis not present

## 2015-02-27 DIAGNOSIS — C3490 Malignant neoplasm of unspecified part of unspecified bronchus or lung: Secondary | ICD-10-CM

## 2015-02-27 LAB — PROTIME-INR
INR: 2.7 (ref 2.00–3.50)
PROTIME: 32.4 s — AB (ref 10.6–13.4)

## 2015-02-27 LAB — POCT INR: INR: 2.7

## 2015-02-27 NOTE — Progress Notes (Signed)
INR = 2.7     Goal 2-3 INR within goal range. No complications of anticoagulation noted. No new medications. He has no appetite and has not been eating much. He continues to drink protein shakes and is concerned about his weight loss; dronabinol has not helped his appetite. He is planning to call his PCP today to discuss the weight loss. When he calls, he will ask his PCP if they will manage his anticoagulation. He and Dr. Julien Nordmann had discussed this back in July. He will continue Coumadin '5mg'$  daily except 2.'5mg'$  MWF. Check with PCP regarding Coumadin management. He will call us and let us know if his PCP will be managing.   Theone Murdoch, PharmD

## 2015-03-06 ENCOUNTER — Telehealth: Payer: Self-pay | Admitting: Pharmacist

## 2015-03-06 NOTE — Telephone Encounter (Signed)
Called pt to find out if he s/w his PCP about Coumadin management. Asked him to call us and let us know if he will be able to go there for INR checks. At present, he does not have appt for INR check here at [facility]. Kennith Center, Pharm.D., CPP 03/06/2015'@10'$ :13 AM

## 2015-03-27 ENCOUNTER — Ambulatory Visit (HOSPITAL_BASED_OUTPATIENT_CLINIC_OR_DEPARTMENT_OTHER): Payer: 59

## 2015-03-27 ENCOUNTER — Other Ambulatory Visit (HOSPITAL_BASED_OUTPATIENT_CLINIC_OR_DEPARTMENT_OTHER): Payer: 59

## 2015-03-27 DIAGNOSIS — C3411 Malignant neoplasm of upper lobe, right bronchus or lung: Secondary | ICD-10-CM | POA: Diagnosis not present

## 2015-03-27 DIAGNOSIS — Z95828 Presence of other vascular implants and grafts: Secondary | ICD-10-CM

## 2015-03-27 DIAGNOSIS — I2699 Other pulmonary embolism without acute cor pulmonale: Secondary | ICD-10-CM | POA: Diagnosis not present

## 2015-03-27 DIAGNOSIS — C3490 Malignant neoplasm of unspecified part of unspecified bronchus or lung: Secondary | ICD-10-CM

## 2015-03-27 LAB — PROTIME-INR
INR: 2.5 (ref 2.00–3.50)
PROTIME: 30 s — AB (ref 10.6–13.4)

## 2015-03-27 MED ORDER — HEPARIN SOD (PORK) LOCK FLUSH 100 UNIT/ML IV SOLN
500.0000 [IU] | Freq: Once | INTRAVENOUS | Status: AC
Start: 2015-03-27 — End: 2015-03-27
  Administered 2015-03-27: 500 [IU] via INTRAVENOUS
  Filled 2015-03-27: qty 5

## 2015-03-27 MED ORDER — SODIUM CHLORIDE 0.9 % IJ SOLN
10.0000 mL | INTRAMUSCULAR | Status: DC | PRN
  Administered 2015-03-27: 10 mL via INTRAVENOUS
  Filled 2015-03-27: qty 10

## 2015-03-27 NOTE — Patient Instructions (Signed)

## 2015-04-03 ENCOUNTER — Telehealth: Payer: Self-pay | Admitting: Medical Oncology

## 2015-04-03 ENCOUNTER — Encounter: Payer: Self-pay | Admitting: Pharmacist

## 2015-04-03 NOTE — Telephone Encounter (Signed)
I left message for pt to stay on same dose and to let me know who his PCP is that will monitor his INR.

## 2015-04-03 NOTE — Progress Notes (Signed)
rec'd phone call from patient stating he will have his INR monitored with his PCP Dr. [name]

## 2015-04-20 ENCOUNTER — Other Ambulatory Visit: Payer: Self-pay | Admitting: Internal Medicine

## 2015-05-01 ENCOUNTER — Encounter (HOSPITAL_COMMUNITY): Payer: Self-pay

## 2015-05-01 ENCOUNTER — Ambulatory Visit (HOSPITAL_COMMUNITY)
Admission: RE | Admit: 2015-05-01 | Discharge: 2015-05-01 | Disposition: A | Discharge status: Home / Self Care | Payer: 59 | Source: Ambulatory Visit | Attending: Internal Medicine | Admitting: Internal Medicine

## 2015-05-01 ENCOUNTER — Ambulatory Visit (HOSPITAL_BASED_OUTPATIENT_CLINIC_OR_DEPARTMENT_OTHER): Payer: 59

## 2015-05-01 ENCOUNTER — Ambulatory Visit (HOSPITAL_BASED_OUTPATIENT_CLINIC_OR_DEPARTMENT_OTHER): Payer: 59 | Admitting: Lab

## 2015-05-01 DIAGNOSIS — R634 Abnormal weight loss: Secondary | ICD-10-CM | POA: Insufficient documentation

## 2015-05-01 DIAGNOSIS — I2510 Atherosclerotic heart disease of native coronary artery without angina pectoris: Secondary | ICD-10-CM | POA: Diagnosis not present

## 2015-05-01 DIAGNOSIS — R630 Anorexia: Secondary | ICD-10-CM | POA: Insufficient documentation

## 2015-05-01 DIAGNOSIS — I2699 Other pulmonary embolism without acute cor pulmonale: Secondary | ICD-10-CM

## 2015-05-01 DIAGNOSIS — C3411 Malignant neoplasm of upper lobe, right bronchus or lung: Secondary | ICD-10-CM | POA: Diagnosis not present

## 2015-05-01 DIAGNOSIS — J432 Centrilobular emphysema: Secondary | ICD-10-CM | POA: Insufficient documentation

## 2015-05-01 DIAGNOSIS — N289 Disorder of kidney and ureter, unspecified: Secondary | ICD-10-CM | POA: Diagnosis not present

## 2015-05-01 DIAGNOSIS — C3490 Malignant neoplasm of unspecified part of unspecified bronchus or lung: Secondary | ICD-10-CM

## 2015-05-01 DIAGNOSIS — K760 Fatty (change of) liver, not elsewhere classified: Secondary | ICD-10-CM | POA: Diagnosis not present

## 2015-05-01 DIAGNOSIS — Z95828 Presence of other vascular implants and grafts: Secondary | ICD-10-CM

## 2015-05-01 DIAGNOSIS — N400 Enlarged prostate without lower urinary tract symptoms: Secondary | ICD-10-CM | POA: Diagnosis not present

## 2015-05-01 DIAGNOSIS — Z79899 Other long term (current) drug therapy: Secondary | ICD-10-CM

## 2015-05-01 LAB — COMPREHENSIVE METABOLIC PANEL (CC13)
ALT: 35 U/L (ref 0–55)
AST: 45 U/L — ABNORMAL HIGH (ref 5–34)
Albumin: 3.4 g/dL — ABNORMAL LOW (ref 3.5–5.0)
Alkaline Phosphatase: 161 U/L — ABNORMAL HIGH (ref 40–150)
Anion Gap: 10 mEq/L (ref 3–11)
BUN: 5.7 mg/dL — AB (ref 7.0–26.0)
CALCIUM: 9 mg/dL (ref 8.4–10.4)
CO2: 23 meq/L (ref 22–29)
Chloride: 107 mEq/L (ref 98–109)
Creatinine: 0.8 mg/dL (ref 0.7–1.3)
EGFR: >90 mL/min/{1.73_m2} (ref >90)
Glucose: 104 mg/dl (ref 70–140)
POTASSIUM: 4 meq/L (ref 3.5–5.1)
Sodium: 140 mEq/L (ref 136–145)
TOTAL PROTEIN: 6.8 g/dL (ref 6.4–8.3)
Total Bilirubin: 0.69 mg/dL (ref 0.20–1.20)

## 2015-05-01 MED ORDER — SODIUM CHLORIDE 0.9 % IJ SOLN
10.0000 mL | INTRAMUSCULAR | Status: DC | PRN
  Administered 2015-05-01: 10 mL via INTRAVENOUS
  Filled 2015-05-01: qty 10

## 2015-05-01 MED ORDER — IOHEXOL 300 MG/ML  SOLN
100.0000 mL | Freq: Once | INTRAMUSCULAR | Status: AC | PRN
  Administered 2015-05-01: 100 mL via INTRAVENOUS

## 2015-05-01 NOTE — Patient Instructions (Signed)

## 2015-05-08 ENCOUNTER — Ambulatory Visit (HOSPITAL_BASED_OUTPATIENT_CLINIC_OR_DEPARTMENT_OTHER): Payer: 59 | Admitting: Internal Medicine

## 2015-05-08 ENCOUNTER — Encounter: Payer: Self-pay | Admitting: Internal Medicine

## 2015-05-08 VITALS — BP 155/92 | HR 99 | Temp 98.2°F | Resp 18 | Ht 73.0 in | Wt 150.0 lb

## 2015-05-08 DIAGNOSIS — R634 Abnormal weight loss: Secondary | ICD-10-CM | POA: Diagnosis not present

## 2015-05-08 DIAGNOSIS — C3411 Malignant neoplasm of upper lobe, right bronchus or lung: Secondary | ICD-10-CM | POA: Diagnosis not present

## 2015-05-08 DIAGNOSIS — I2699 Other pulmonary embolism without acute cor pulmonale: Secondary | ICD-10-CM

## 2015-05-08 DIAGNOSIS — C3490 Malignant neoplasm of unspecified part of unspecified bronchus or lung: Secondary | ICD-10-CM

## 2015-05-08 MED ORDER — METHYLPREDNISOLONE 4 MG PO TBPK: ORAL_TABLET | ORAL | Status: DC

## 2015-05-08 NOTE — Addendum Note (Signed)
Addended by: Lucile Crater on: 05/08/2015 02:26 PM   Modules accepted: Orders, Medications

## 2015-05-08 NOTE — Progress Notes (Signed)
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 2) incidental diagnosis of pulmonary embolism on CT scan of the chest performed on 04/18/2014. Primary site: Lung (Right)  Staging method: AJCC 7th Edition  Clinical: Stage IV (T1a, N1, M1b) signed by Curt Bears, MD on 12/07/2013 3:19 PM  Summary: Stage IV (T1a, N1, M1b)   PRIOR THERAPY:  1)  Systemic chemotherapy with carboplatin for AUC of 5 given on day 1, etoposide 100 mg/m2 given on days 1, 2 an3 with neulast support on day 4. Status post 6 cycles.  2) Lovenox 120 mg subcutaneously started 04/18/2014. 3) status post whole brain irradiation under the care of Dr. Sondra Come completed 06/21/2014  CURRENT THERAPY:  1) Observation for the small cell lung cancer. 2) initial treatment with Xarelto 20 mg by mouth daily. First dose of treatment on 05/03/2014. This was switched on 10/01/2014 to Coumadin and followed by the Coumadin clinic at the [facility].  DISEASE STAGE:  Small cell carcinoma of lung, Extensive stage  Primary site: Lung (Right)  Staging method: AJCC 7th Edition  Clinical: Stage IV (T1a, N1, M1b) signed by Curt Bears, MD on 12/07/2013 3:19 PM  Summary: Stage IV (T1a, N1, M1b)  CHEMOTHERAPY INTENT: pallative  CURRENT # OF CHEMOTHERAPY CYCLES: 0 CURRENT ANTIEMETICS: Zofran, dexamethasone and compazine  CURRENT SMOKING STATUS: Former smoker, quit 12/18/2013  ORAL CHEMOTHERAPY AND CONSENT: n/a  CURRENT BISPHOSPHONATES USE: none  PAIN MANAGEMENT: Lortab 5/325 mg  NARCOTICS INDUCED CONSTIPATION: none  LIVING WILL AND CODE STATUS: ?   INTERVAL HISTORY: Jaime Morris 54 y.o. male returns to the clinic today for followup visit. The patient is feeling fine today with no specific complaints except for mild  fatigue. He lost a lot of weight recently and has pain on the hip bilaterally as well as the left shoulder. He was started by his infectious disease doctor on Marinol 10 mg by mouth twice a day with no significant improvement in his weight. His Coumadin dose is currently managed by his primary care physician Dr. [name]. He has no chest pain, shortness of breath, cough or hemoptysis. He has no nausea or vomiting. No fever or chills. The patient has repeat CT scan of the chest, abdomen and pelvis performed recently and he is here for evaluation and discussion of his scan results.  MEDICAL HISTORY: Past Medical History  Diagnosis Date  . HIV infection ([place])     DR. [name]  . Hyperlipidemia   . Insomnia   . Lung nodule, multiple 10/24/13    CT CHEST W/O...DR. [name]  . Gynecomastia, male 10/16/13    MILD RIGHT SIDED PER RADIOLOGY STUDY  . COPD (chronic obstructive pulmonary disease) ([place])     PFT'S 10/26/13 @ [facility]  . History of depression   . History of pneumonia   . Small cell lung cancer ([place])   . Collapsed lung     d/t biopsy, right  . 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  MEDICATIONS:  Current Outpatient Prescriptions  Medication Sig Dispense Refill  . albuterol (PROAIR HFA) 108 (90 BASE) MCG/ACT inhaler Inhale 2 puffs into the lungs every 4 (four) hours as needed for wheezing or shortness of breath.     Marland Kitchen  doxylamine, Sleep, (UNISOM) 25 MG tablet Take 50 mg by mouth at bedtime as needed for sleep.     Marland Kitchen dronabinol (MARINOL) 10 MG capsule TAKE ONE CAPSULE BY MOUTH TWICE DAILY BEFORE A MEAL 60 capsule 2  . efavirenz-emtricitabine-tenofovir (ATRIPLA) 600-200-300 MG per tablet Take 1 tablet by mouth daily. 90 tablet 3  . emollient (BIAFINE) cream Apply topically as needed.    . fluticasone (FLONASE) 50 MCG/ACT nasal spray Place 2 sprays into both nostrils daily as needed for allergies.     . Melatonin 10 MG  TABS Take 10 mg by mouth at bedtime as needed (sleep).    . Nutritional Supplements (HIGH-PROTEIN NUTRITIONAL SHAKE) LIQD Take 1 each by mouth 2 (two) times daily between meals.    . prochlorperazine (COMPAZINE) 10 MG tablet Take 1 tablet (10 mg total) by mouth every 6 (six) hours as needed for nausea or vomiting. (Patient not taking: Reported on 09/18/2014) 30 tablet 1  . Tiotropium Bromide Monohydrate (SPIRIVA RESPIMAT) 2.5 MCG/ACT AERS Inhale 2 puffs into the lungs daily. 3 Inhaler 2  . traZODone (DESYREL) 100 MG tablet Take 100 mg by mouth at bedtime.    . varenicline (CHANTIX) 1 MG tablet Take 1 mg by mouth 2 (two) times daily.     . vitamin B-12 (CYANOCOBALAMIN) 500 MCG tablet Take 500 mcg by mouth daily.    Marland Kitchen warfarin (COUMADIN) 5 MG tablet Take 1 tablet (5 mg total) by mouth as directed. 30 tablet 2   No current facility-administered medications for this visit.   Facility-Administered Medications Ordered in Other Visits  Medication Dose Route Frequency Provider Last Rate Last Dose  . sodium chloride 0.9 % injection 10 mL  10 mL Intracatheter PRN Curt Bears, MD   10 mL at 01/30/14 1451    SURGICAL HISTORY:  Past Surgical History  Procedure Laterality Date  . Foot surgery      BUNIONECTOMY  . Back surgery      L 4-5 FUSION WITH SCREW  . Colonoscopy  UTD  . Portacath placement N/A 12/08/2013    Procedure: INSERTION PORT-A-CATH;  Surgeon: Grace Isaac, MD;  Location: [facility];  Service: Thoracic;  Laterality: N/A;    REVIEW OF SYSTEMS:  Constitutional: positive for fatigue Eyes: negative Ears, nose, mouth, throat, and face: negative Respiratory: negative Cardiovascular: negative Gastrointestinal: negative Genitourinary:negative Integument/breast: negative Hematologic/lymphatic: negative Musculoskeletal:positive for arthralgias and muscle weakness Neurological: negative Behavioral/Psych: negative Endocrine: negative Allergic/Immunologic: negative   PHYSICAL  EXAMINATION: General appearance: alert, cooperative, fatigued and no distress Head: Normocephalic, without obvious abnormality, atraumatic Neck: no adenopathy, no JVD, supple, symmetrical, trachea midline and thyroid not enlarged, symmetric, no tenderness/mass/nodules Lymph nodes: Cervical, supraclavicular, and axillary nodes normal. Resp: clear to auscultation bilaterally Back: symmetric, no curvature. ROM normal. No CVA tenderness. Cardio: regular rate and rhythm, S1, S2 normal, no murmur, click, rub or gallop GI: soft, non-tender; bowel sounds normal; no masses,  no organomegaly Extremities: extremities normal, atraumatic, no cyanosis or edema Neurologic: Alert and oriented X 3, normal strength and tone. Normal symmetric reflexes. Normal coordination and gait  ECOG PERFORMANCE STATUS: 1 - Symptomatic but completely ambulatory  Blood pressure 155/92, pulse 99, temperature 98.2 F (36.8 C), temperature source Oral, resp. rate 18, height '6\' 1"'$  (1.854 m), weight 150 lb (68.04 kg), SpO2 100 %.  LABORATORY DATA: Lab Results  Component Value Date   WBC 7.8 01/02/2015   HGB 14.2 01/02/2015   HCT 40.9 01/02/2015   MCV 94.5 01/02/2015   PLT  208 01/02/2015      Chemistry      Component Value Date/Time   NA 140 05/01/2015 1003   NA 134* 01/22/2014 0955   K 4.0 05/01/2015 1003   K 4.2 01/22/2014 0955   CL 102 01/22/2014 0955   CO2 23 05/01/2015 1003   CO2 23 01/22/2014 0955   BUN 5.7* 05/01/2015 1003   BUN 12 01/22/2014 0955   CREATININE 0.8 05/01/2015 1003   CREATININE 0.97 01/22/2014 0955   CREATININE 0.79 10/23/2013 1112      Component Value Date/Time   CALCIUM 9.0 05/01/2015 1003   CALCIUM 9.6 01/22/2014 0955   ALKPHOS 161* 05/01/2015 1003   ALKPHOS 138* 01/22/2014 0955   AST 45* 05/01/2015 1003   AST 23 01/22/2014 0955   ALT 35 05/01/2015 1003   ALT 28 01/22/2014 0955   BILITOT 0.69 05/01/2015 1003   BILITOT 0.5 01/22/2014 0955       RADIOGRAPHIC STUDIES: Ct Chest  W Contrast  05/01/2015  CLINICAL DATA:  Lung cancer, weight loss, anorexia. EXAM: CT CHEST, ABDOMEN, AND PELVIS WITH CONTRAST TECHNIQUE: Multidetector CT imaging of the chest, abdomen and pelvis was performed following the standard protocol during bolus administration of intravenous contrast. CONTRAST:  156m OMNIPAQUE IOHEXOL 300 MG/ML  SOLN COMPARISON:  01/02/2015. FINDINGS: CT CHEST FINDINGS Mediastinum/Nodes: Left IJ Port-A-Cath terminates in the right atrium. No pathologically enlarged mediastinal, left hilar or axillary lymph nodes. There is soft tissue in the right hilum, measuring up to 2.3 cm in long axis (series 2, image 34), stable when remeasured on the prior exam. Additional irregular soft tissue is seen slightly more inferiorly within the right hilum, measures approximately 2.2 cm (series 2, image 38) and is grossly stable as well. Coronary artery calcification. Heart size normal. No pericardial effusion. Lungs/Pleura: Centrilobular and paraseptal emphysema with bullous changes predominantly at the apices. Ground-glass and volume loss in the lateral segment right middle lobe, similar, with exception of a small nodular component laterally, measuring 10 mm (series 4, image 36). Additional scattered tiny pulmonary nodules measure 4 mm or less in size, stable. No pleural fluid. Marked narrowing of the right middle lobe bronchus, as before. Musculoskeletal: No worrisome lytic or sclerotic lesions. Degenerative changes are seen in the spine. CT ABDOMEN PELVIS FINDINGS Hepatobiliary: Liver is decreased in attenuation diffusely. Liver and gallbladder are otherwise unremarkable. No biliary ductal dilatation. Pancreas: Negative. Spleen: Negative. Adrenals/Urinary Tract: Slight thickening of the adrenal glands bilaterally, stable. Right kidney is unremarkable. 3 mm low-attenuation lesion in the interpolar left kidney is too small to characterize. Ureters are decompressed. Bladder is low in volume.  Stomach/Bowel: Stomach, small bowel, appendix and colon are unremarkable. Vascular/Lymphatic: Atherosclerotic calcification of the arterial vasculature without abdominal aortic aneurysm. No pathologically enlarged lymph nodes. Reproductive: Prostate is enlarged. Other: No free fluid.  Mesenteries and peritoneum are unremarkable. Musculoskeletal: No worrisome lytic or sclerotic lesions. Postoperative changes in the lumbar spine. IMPRESSION: 1. Irregular right hilar soft tissue is unchanged. Associated narrowing of the right middle lobe bronchus with ground-glass and volume loss in the right middle lobe. Nodular component seen laterally is new. Continued attention on followup exams is warranted. 2. Coronary artery calcification. 3. Hepatic steatosis. 4. Enlarged prostate. Electronically Signed   By: MLorin PicketM.D.   On: 05/01/2015 14:02   Ct Abdomen Pelvis W Contrast  05/01/2015  CLINICAL DATA:  Lung cancer, weight loss, anorexia. EXAM: CT CHEST, ABDOMEN, AND PELVIS WITH CONTRAST TECHNIQUE: Multidetector CT imaging of the chest, abdomen and  pelvis was performed following the standard protocol during bolus administration of intravenous contrast. CONTRAST:  157m OMNIPAQUE IOHEXOL 300 MG/ML  SOLN COMPARISON:  01/02/2015. FINDINGS: CT CHEST FINDINGS Mediastinum/Nodes: Left IJ Port-A-Cath terminates in the right atrium. No pathologically enlarged mediastinal, left hilar or axillary lymph nodes. There is soft tissue in the right hilum, measuring up to 2.3 cm in long axis (series 2, image 34), stable when remeasured on the prior exam. Additional irregular soft tissue is seen slightly more inferiorly within the right hilum, measures approximately 2.2 cm (series 2, image 38) and is grossly stable as well. Coronary artery calcification. Heart size normal. No pericardial effusion. Lungs/Pleura: Centrilobular and paraseptal emphysema with bullous changes predominantly at the apices. Ground-glass and volume loss in the  lateral segment right middle lobe, similar, with exception of a small nodular component laterally, measuring 10 mm (series 4, image 36). Additional scattered tiny pulmonary nodules measure 4 mm or less in size, stable. No pleural fluid. Marked narrowing of the right middle lobe bronchus, as before. Musculoskeletal: No worrisome lytic or sclerotic lesions. Degenerative changes are seen in the spine. CT ABDOMEN PELVIS FINDINGS Hepatobiliary: Liver is decreased in attenuation diffusely. Liver and gallbladder are otherwise unremarkable. No biliary ductal dilatation. Pancreas: Negative. Spleen: Negative. Adrenals/Urinary Tract: Slight thickening of the adrenal glands bilaterally, stable. Right kidney is unremarkable. 3 mm low-attenuation lesion in the interpolar left kidney is too small to characterize. Ureters are decompressed. Bladder is low in volume. Stomach/Bowel: Stomach, small bowel, appendix and colon are unremarkable. Vascular/Lymphatic: Atherosclerotic calcification of the arterial vasculature without abdominal aortic aneurysm. No pathologically enlarged lymph nodes. Reproductive: Prostate is enlarged. Other: No free fluid.  Mesenteries and peritoneum are unremarkable. Musculoskeletal: No worrisome lytic or sclerotic lesions. Postoperative changes in the lumbar spine. IMPRESSION: 1. Irregular right hilar soft tissue is unchanged. Associated narrowing of the right middle lobe bronchus with ground-glass and volume loss in the right middle lobe. Nodular component seen laterally is new. Continued attention on followup exams is warranted. 2. Coronary artery calcification. 3. Hepatic steatosis. 4. Enlarged prostate. Electronically Signed   By: MLorin PicketM.D.   On: 05/01/2015 14:02   ASSESSMENT AND PLAN:  This is a very pleasant 54years old [place] male recently diagnosed with:  1) Extensive stage small cell lung cancer: He is status post 6 cycles of systemic chemotherapy with carboplatin and  etoposide. This was followed by prophylactic cranial irradiation.  Has been on observation and the recent CT scan of the chest, abdomen and pelvis showed no evidence for disease progression except for new right middle lobe nodular opacity. I discussed the scan results with the patient and showed them the images of the scan.  I recommended for him to continue on observation with repeat CT scan of the chest, abdomen and pelvis in 3 months for reevaluation of his disease.  2) right lower lobe pulmonary embolus: He will continue his Coumadin treatment under the care of his primary care physician.  3) For the weight loss, I recommended for the patient to continue Marinol and I also started him on Medrol Dosepak.  His blood pressure is elevated today and the patient is not on any antihypertensive medication. I recommended for him to check his blood pressure regularly at home and if still elevated to contact his primary care physician for evaluation and management of this condition.  He will also continue with Port-A-Cath flush every 6 weeks.  He was advised to call immediately if he has any  concerning symptoms in the interval. The patient voices understanding of current disease status and treatment options and is in agreement with the current care plan.  All questions were answered. The patient knows to call the clinic with any problems, questions or concerns. We can certainly see the patient much sooner if necessary.  Disclaimer: This note was dictated with voice recognition software. Similar sounding words can inadvertently be transcribed and may not be corrected upon review.

## 2015-05-08 NOTE — Progress Notes (Signed)
Pt called  Medrol Dose pack went to wrong pharmacy- [facility]. Pt requesting Rx be sent to [facility]. Rx reordered and sent to [facility].

## 2015-05-10 ENCOUNTER — Telehealth: Payer: Self-pay | Admitting: Internal Medicine

## 2015-05-10 NOTE — Telephone Encounter (Signed)
per pof to schpt appt-cld pt to adv of appt time &date-adv to come get contrast prior to appt-adv to call if any questions [phone] central sch #[phone]

## 2015-05-28 ENCOUNTER — Other Ambulatory Visit: Payer: Self-pay | Admitting: Medical Oncology

## 2015-05-28 DIAGNOSIS — I2699 Other pulmonary embolism without acute cor pulmonale: Secondary | ICD-10-CM

## 2015-05-28 MED ORDER — WARFARIN SODIUM 5 MG PO TABS: 5.0000 mg | ORAL_TABLET | ORAL | Status: DC

## 2015-06-04 ENCOUNTER — Other Ambulatory Visit: Payer: Self-pay | Admitting: Internal Medicine

## 2015-06-04 DIAGNOSIS — B20 Human immunodeficiency virus [HIV] disease: Secondary | ICD-10-CM

## 2015-08-07 ENCOUNTER — Other Ambulatory Visit (HOSPITAL_BASED_OUTPATIENT_CLINIC_OR_DEPARTMENT_OTHER): Payer: BLUE CROSS/BLUE SHIELD

## 2015-08-07 ENCOUNTER — Ambulatory Visit (HOSPITAL_BASED_OUTPATIENT_CLINIC_OR_DEPARTMENT_OTHER): Payer: BLUE CROSS/BLUE SHIELD

## 2015-08-07 ENCOUNTER — Encounter (HOSPITAL_COMMUNITY): Payer: Self-pay

## 2015-08-07 ENCOUNTER — Ambulatory Visit (HOSPITAL_COMMUNITY)
Admission: RE | Admit: 2015-08-07 | Discharge: 2015-08-07 | Disposition: A | Discharge status: Home / Self Care | Payer: BLUE CROSS/BLUE SHIELD | Source: Ambulatory Visit | Attending: Internal Medicine | Admitting: Internal Medicine

## 2015-08-07 DIAGNOSIS — N400 Enlarged prostate without lower urinary tract symptoms: Secondary | ICD-10-CM | POA: Insufficient documentation

## 2015-08-07 DIAGNOSIS — J439 Emphysema, unspecified: Secondary | ICD-10-CM | POA: Insufficient documentation

## 2015-08-07 DIAGNOSIS — E279 Disorder of adrenal gland, unspecified: Secondary | ICD-10-CM | POA: Insufficient documentation

## 2015-08-07 DIAGNOSIS — Z9221 Personal history of antineoplastic chemotherapy: Secondary | ICD-10-CM | POA: Diagnosis not present

## 2015-08-07 DIAGNOSIS — R590 Localized enlarged lymph nodes: Secondary | ICD-10-CM | POA: Insufficient documentation

## 2015-08-07 DIAGNOSIS — Z95828 Presence of other vascular implants and grafts: Secondary | ICD-10-CM

## 2015-08-07 DIAGNOSIS — C3411 Malignant neoplasm of upper lobe, right bronchus or lung: Secondary | ICD-10-CM | POA: Insufficient documentation

## 2015-08-07 DIAGNOSIS — K5730 Diverticulosis of large intestine without perforation or abscess without bleeding: Secondary | ICD-10-CM | POA: Diagnosis not present

## 2015-08-07 DIAGNOSIS — C3490 Malignant neoplasm of unspecified part of unspecified bronchus or lung: Secondary | ICD-10-CM

## 2015-08-07 DIAGNOSIS — M899 Disorder of bone, unspecified: Secondary | ICD-10-CM | POA: Insufficient documentation

## 2015-08-07 DIAGNOSIS — R160 Hepatomegaly, not elsewhere classified: Secondary | ICD-10-CM | POA: Diagnosis not present

## 2015-08-07 DIAGNOSIS — J9811 Atelectasis: Secondary | ICD-10-CM | POA: Insufficient documentation

## 2015-08-07 DIAGNOSIS — I2510 Atherosclerotic heart disease of native coronary artery without angina pectoris: Secondary | ICD-10-CM | POA: Diagnosis not present

## 2015-08-07 LAB — CBC WITH DIFFERENTIAL/PLATELET
BASO%: 0.3 % (ref 0.0–2.0)
Basophils Absolute: 0 10*3/uL (ref 0.0–0.1)
EOS%: 0.9 % (ref 0.0–7.0)
Eosinophils Absolute: 0.1 10*3/uL (ref 0.0–0.5)
HCT: 40.9 % (ref 38.4–49.9)
HGB: 14.4 g/dL (ref 13.0–17.1)
LYMPH%: 21.4 % (ref 14.0–49.0)
MCH: 32.9 pg (ref 27.2–33.4)
MCHC: 35.2 g/dL (ref 32.0–36.0)
MCV: 93.4 fL (ref 79.3–98.0)
MONO#: 0.6 10*3/uL (ref 0.1–0.9)
MONO%: 7.1 % (ref 0.0–14.0)
NEUT#: 5.6 10*3/uL (ref 1.5–6.5)
NEUT%: 70.3 % (ref 39.0–75.0)
Platelets: 173 10*3/uL (ref 140–400)
RBC: 4.38 10*6/uL (ref 4.20–5.82)
RDW: 12.8 % (ref 11.0–14.6)
WBC: 7.9 10*3/uL (ref 4.0–10.3)
lymph#: 1.7 10*3/uL (ref 0.9–3.3)

## 2015-08-07 LAB — COMPREHENSIVE METABOLIC PANEL
ALT: 22 U/L (ref 0–55)
AST: 32 U/L (ref 5–34)
Albumin: 3.4 g/dL — ABNORMAL LOW (ref 3.5–5.0)
Alkaline Phosphatase: 134 U/L (ref 40–150)
Anion Gap: 11 mEq/L (ref 3–11)
BUN: <4 mg/dL — ABNORMAL LOW (ref 7.0–26.0)
CO2: 24 mEq/L (ref 22–29)
Calcium: 9.1 mg/dL (ref 8.4–10.4)
Chloride: 104 mEq/L (ref 98–109)
Creatinine: 0.7 mg/dL (ref 0.7–1.3)
EGFR: >90 mL/min/{1.73_m2} (ref >90)
Glucose: 92 mg/dl (ref 70–140)
Potassium: 3.8 mEq/L (ref 3.5–5.1)
Sodium: 138 mEq/L (ref 136–145)
Total Bilirubin: 1 mg/dL (ref 0.20–1.20)
Total Protein: 7 g/dL (ref 6.4–8.3)

## 2015-08-07 MED ORDER — SODIUM CHLORIDE 0.9% FLUSH
10.0000 mL | INTRAVENOUS | Status: DC | PRN
  Administered 2015-08-07: 10 mL via INTRAVENOUS
  Filled 2015-08-07: qty 10

## 2015-08-07 MED ORDER — IOHEXOL 300 MG/ML  SOLN
100.0000 mL | Freq: Once | INTRAMUSCULAR | Status: AC | PRN
  Administered 2015-08-07: 100 mL via INTRAVENOUS

## 2015-08-07 NOTE — Patient Instructions (Signed)

## 2015-08-13 ENCOUNTER — Encounter: Payer: Self-pay | Admitting: Internal Medicine

## 2015-08-13 ENCOUNTER — Ambulatory Visit (HOSPITAL_BASED_OUTPATIENT_CLINIC_OR_DEPARTMENT_OTHER): Payer: BLUE CROSS/BLUE SHIELD | Admitting: Internal Medicine

## 2015-08-13 VITALS — BP 134/83 | HR 95 | Temp 98.4°F | Resp 18 | Ht 73.0 in | Wt 146.3 lb

## 2015-08-13 DIAGNOSIS — R634 Abnormal weight loss: Secondary | ICD-10-CM

## 2015-08-13 DIAGNOSIS — C3490 Malignant neoplasm of unspecified part of unspecified bronchus or lung: Secondary | ICD-10-CM

## 2015-08-13 DIAGNOSIS — I2699 Other pulmonary embolism without acute cor pulmonale: Secondary | ICD-10-CM | POA: Diagnosis not present

## 2015-08-13 NOTE — Progress Notes (Signed)
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 2) incidental diagnosis of pulmonary embolism on CT scan of the chest performed on 04/18/2014. Primary site: Lung (Right)  Staging method: AJCC 7th Edition  Clinical: Stage IV (T1a, N1, M1b) signed by Curt Bears, MD on 12/07/2013 3:19 PM  Summary: Stage IV (T1a, N1, M1b)   PRIOR THERAPY:  1)  Systemic chemotherapy with carboplatin for AUC of 5 given on day 1, etoposide 100 mg/m2 given on days 1, 2 an3 with neulast support on day 4. Status post 6 cycles.  2) Lovenox 120 mg subcutaneously started 04/18/2014. 3) status post whole brain irradiation under the care of Dr. Sondra Come completed 06/21/2014  CURRENT THERAPY:  1) Observation for the small cell lung cancer. 2) initial treatment with Xarelto 20 mg by mouth daily. First dose of treatment on 05/03/2014. This was switched on 10/01/2014 to Coumadin and followed by the Coumadin clinic at the [facility].  DISEASE STAGE:  Small cell carcinoma of lung, Extensive stage  Primary site: Lung (Right)  Staging method: AJCC 7th Edition  Clinical: Stage IV (T1a, N1, M1b) signed by Curt Bears, MD on 12/07/2013 3:19 PM  Summary: Stage IV (T1a, N1, M1b)  CHEMOTHERAPY INTENT: pallative  CURRENT # OF CHEMOTHERAPY CYCLES: 0 CURRENT ANTIEMETICS: Zofran, dexamethasone and compazine  CURRENT SMOKING STATUS: Former smoker, quit 12/18/2013  ORAL CHEMOTHERAPY AND CONSENT: n/a  CURRENT BISPHOSPHONATES USE: none  PAIN MANAGEMENT: Lortab 5/325 mg  NARCOTICS INDUCED CONSTIPATION: none  LIVING WILL AND CODE STATUS: ?   INTERVAL HISTORY: Jaime Morris 55 y.o. male returns to the clinic today for followup visit. The patient is feeling fine today with no specific complaints except for mild  fatigue and pain in the lower back. He lost a few more pounds since his last visit. His Coumadin dose is currently managed by his primary care physician Dr. [name]. He has no chest pain, shortness of breath, cough or hemoptysis. He has no nausea or vomiting. No fever or chills. The patient has repeat CT scan of the chest, abdomen and pelvis performed recently and he is here for evaluation and discussion of his scan results.  MEDICAL HISTORY: Past Medical History  Diagnosis Date  . HIV infection ([facility])     DR. [name]  . Hyperlipidemia   . Insomnia   . Lung nodule, multiple 10/24/13    CT CHEST W/O...DR. [name]  . Gynecomastia, male 10/16/13    MILD RIGHT SIDED PER RADIOLOGY STUDY  . COPD (chronic obstructive pulmonary disease) ([facility])     PFT'S 10/26/13 @ [facility]  . History of depression   . History of pneumonia   . Collapsed lung     d/t biopsy, right  . Radiation 06/05/14-06/20/14    whole brain 25 gray  . Small cell lung cancer ([facility])     ALLERGIES:  is allergic to lactose intolerance (gi).  MEDICATIONS:  Current Outpatient Prescriptions  Medication Sig Dispense Refill  . doxylamine, Sleep, (UNISOM) 25 MG tablet Take 50 mg by mouth at bedtime as needed for sleep.     Marland Kitchen dronabinol (MARINOL) 10 MG capsule TAKE ONE CAPSULE BY MOUTH TWICE DAILY BEFORE A MEAL 60 capsule 2  . Melatonin 10 MG TABS Take 10 mg by mouth at bedtime as  needed (sleep).    . Multiple Vitamin (MULTIVITAMIN) tablet Take 1 tablet by mouth daily.    . Nutritional Supplements (HIGH-PROTEIN NUTRITIONAL SHAKE) LIQD Take 1 each by mouth 2 (two) times daily between meals.    . prochlorperazine (COMPAZINE) 10 MG tablet Take 1 tablet (10 mg total) by mouth every 6 (six) hours as needed for nausea or vomiting. 30 tablet 1  . traZODone (DESYREL) 100 MG tablet Take 100 mg by mouth at bedtime.    Marland Kitchen warfarin (COUMADIN) 5 MG tablet Take 1 tablet (5 mg total) by mouth as directed. 30 tablet 2  . albuterol (PROAIR HFA) 108 (90  BASE) MCG/ACT inhaler Inhale 2 puffs into the lungs every 4 (four) hours as needed for wheezing or shortness of breath. Reported on 08/13/2015    . ATRIPLA 600-200-300 MG tablet TAKE 1 TABLET BY MOUTH DAILY (Patient not taking: Reported on 08/13/2015) 30 tablet 2  . emollient (BIAFINE) cream Apply topically as needed. Reported on 08/13/2015    . fluticasone (FLONASE) 50 MCG/ACT nasal spray Place 2 sprays into both nostrils daily as needed for allergies. Reported on 08/13/2015    . varenicline (CHANTIX) 1 MG tablet Take 1 mg by mouth 2 (two) times daily. Reported on 08/13/2015    . vitamin B-12 (CYANOCOBALAMIN) 500 MCG tablet Take 500 mcg by mouth daily.     No current facility-administered medications for this visit.   Facility-Administered Medications Ordered in Other Visits  Medication Dose Route Frequency Provider Last Rate Last Dose  . sodium chloride 0.9 % injection 10 mL  10 mL Intracatheter PRN Curt Bears, MD   10 mL at 01/30/14 1451    SURGICAL HISTORY:  Past Surgical History  Procedure Laterality Date  . Foot surgery      BUNIONECTOMY  . Back surgery      L 4-5 FUSION WITH SCREW  . Colonoscopy  UTD  . Portacath placement N/A 12/08/2013    Procedure: INSERTION PORT-A-CATH;  Surgeon: Grace Isaac, MD;  Location: [facility];  Service: Thoracic;  Laterality: N/A;    REVIEW OF SYSTEMS:  Constitutional: positive for fatigue and weight loss Eyes: negative Ears, nose, mouth, throat, and face: negative Respiratory: negative Cardiovascular: negative Gastrointestinal: negative Genitourinary:negative Integument/breast: negative Hematologic/lymphatic: negative Musculoskeletal:positive for arthralgias, bone pain and muscle weakness Neurological: negative Behavioral/Psych: negative Endocrine: negative Allergic/Immunologic: negative   PHYSICAL EXAMINATION: General appearance: alert, cooperative, fatigued and no distress Head: Normocephalic, without obvious abnormality,  atraumatic Neck: no adenopathy, no JVD, supple, symmetrical, trachea midline and thyroid not enlarged, symmetric, no tenderness/mass/nodules Lymph nodes: Cervical, supraclavicular, and axillary nodes normal. Resp: clear to auscultation bilaterally Back: symmetric, no curvature. ROM normal. No CVA tenderness. Cardio: regular rate and rhythm, S1, S2 normal, no murmur, click, rub or gallop GI: soft, non-tender; bowel sounds normal; no masses,  no organomegaly Extremities: extremities normal, atraumatic, no cyanosis or edema Neurologic: Alert and oriented X 3, normal strength and tone. Normal symmetric reflexes. Normal coordination and gait  ECOG PERFORMANCE STATUS: 1 - Symptomatic but completely ambulatory  Blood pressure 134/83, pulse 95, temperature 98.4 F (36.9 C), temperature source Oral, resp. rate 18, height '6\' 1"'$  (1.854 m), weight 146 lb 4.8 oz (66.361 kg), SpO2 100 %.  LABORATORY DATA: Lab Results  Component Value Date   WBC 7.9 08/07/2015   HGB 14.4 08/07/2015   HCT 40.9 08/07/2015   MCV 93.4 08/07/2015   PLT 173 08/07/2015      Chemistry      Component Value  Date/Time   NA 138 08/07/2015 1000   NA 134* 01/22/2014 0955   K 3.8 08/07/2015 1000   K 4.2 01/22/2014 0955   CL 102 01/22/2014 0955   CO2 24 08/07/2015 1000   CO2 23 01/22/2014 0955   BUN <4.0* 08/07/2015 1000   BUN 12 01/22/2014 0955   CREATININE 0.7 08/07/2015 1000   CREATININE 0.97 01/22/2014 0955   CREATININE 0.79 10/23/2013 1112      Component Value Date/Time   CALCIUM 9.1 08/07/2015 1000   CALCIUM 9.6 01/22/2014 0955   ALKPHOS 134 08/07/2015 1000   ALKPHOS 138* 01/22/2014 0955   AST 32 08/07/2015 1000   AST 23 01/22/2014 0955   ALT 22 08/07/2015 1000   ALT 28 01/22/2014 0955   BILITOT 1.00 08/07/2015 1000   BILITOT 0.5 01/22/2014 0955       RADIOGRAPHIC STUDIES: Ct Chest W Contrast  08/07/2015  CLINICAL DATA:  Small cell lung carcinoma of the right upper lobe diagnosed on 11/28/2013 status  post chemotherapy, presenting for restaging. EXAM: CT CHEST, ABDOMEN, AND PELVIS WITH CONTRAST TECHNIQUE: Multidetector CT imaging of the chest, abdomen and pelvis was performed following the standard protocol during bolus administration of intravenous contrast. CONTRAST:  18m OMNIPAQUE IOHEXOL 300 MG/ML  SOLN COMPARISON:  05/01/2015 CT chest, abdomen and pelvis. FINDINGS: CT CHEST Mediastinum/Nodes: Normal heart size. Stable trace anterior pericardial fluid/ thickening. Coronary atherosclerosis. Left subclavian MediPort terminates at the cavoatrial junction. Great vessels are normal in course and caliber. No central pulmonary emboli. Normal visualized thyroid. Normal esophagus. No axillary adenopathy. New mildly enlarged 1.2 cm right paratracheal node (series 2/ image 23), previously 0.8 cm, increased. Mildly enlarged 1.1 cm subcarinal node (series 2/ image 34), previously 1.0 cm, not appreciably changed. Mildly enlarged 1.0 cm right hilar node (series 2/ image 36), previously 1.0 cm, unchanged. Mildly enlarged 1.2 cm right infrahilar node (series 2/image 40) with associated occlusion of the right middle lobe bronchus, previously 1.2 cm, not appreciably changed. No additional pathologically enlarged mediastinal or hilar nodes. Lungs/Pleura: No pneumothorax. No pleural effusion. Moderate centrilobular and paraseptal emphysema with mild diffuse bronchial wall thickening. Near complete right middle lobe atelectasis. Subsolid 0.8 cm nodule at the periphery of the right middle lobe (series 4/ image 40) is decreased from 1.0 cm, suggesting inflammatory etiology. At least six scattered tiny 3 mm pulmonary nodules in both lungs are unchanged and probably benign. No acute consolidative airspace disease or new significant pulmonary nodules. Musculoskeletal: No aggressive appearing focal osseous lesions. Mild degenerative changes in the thoracic spine. CT ABDOMEN AND PELVIS Hepatobiliary: There is a new 1.2 x 0.9 cm  hypodense liver mass in the posterior segment 2 left liver lobe (series 2/ image 57). There is a new subcapsular 0.8 x 0.7 cm hypodense liver mass in the posterior right liver lobe (series 2/ image 72). No additional liver lesions. Normal gallbladder with no radiopaque cholelithiasis. No biliary ductal dilatation. Pancreas: Normal, with no mass or duct dilation. Spleen: Normal size. No mass. Adrenals/Urinary Tract: Hypodense 2.1 x 1.3 cm right adrenal mass (series 2/ image 67), increased from 1.5 x 0.8 cm. Hypodense 1.5 x 1.0 cm left adrenal mass (series 5/ image 6), previously 1.3 x 0.9 cm, mildly increased. Subcentimeter hypodense renal cortical lesion in the posterior interpolar left kidney, too small to characterize, unchanged, probably a benign renal cyst. No hydronephrosis. Normal bladder. Stomach/Bowel: Grossly normal stomach. Normal caliber small bowel with no small bowel wall thickening. Normal appendix. Mild scattered colonic diverticulosis, with no  large bowel wall thickening or pericolonic fat stranding. Vascular/Lymphatic: Atherosclerotic nonaneurysmal abdominal aorta. Patent portal, splenic, hepatic and renal veins. No pathologically enlarged lymph nodes in the abdomen or pelvis. Reproductive: Stable moderate prostatomegaly. Other: No pneumoperitoneum, ascites or focal fluid collection. 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 No additional aggressive appearing bone lesions. Status post bilateral posterior spinal fusion from L4-S1. Stable severe degenerative disc disease in the lower lumbar spine and stable anterolisthesis at L5-S1. IMPRESSION: 1. New mild right paratracheal lymphadenopathy, probably a nodal metastasis. 2. Stable mild right hilar and mild subcarinal lymphadenopathy. Stable near complete right middle lobe atelectasis. Small subsolid nodular focus in the peripheral right middle lobe is decreased and likely  inflammatory. 3. Two new small hypodense liver masses, probably liver metastases. 4. 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 5. 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 6. Additional findings include coronary atherosclerosis, moderate emphysema, mild diffuse colonic diverticulosis and moderate prostatomegaly. Electronically Signed   By: Ilona Sorrel M.D.   On: 08/07/2015 13:30   Ct Abdomen Pelvis W Contrast  08/07/2015  CLINICAL DATA:  Small cell lung carcinoma of the right upper lobe diagnosed on 11/28/2013 status post chemotherapy, presenting for restaging. EXAM: CT CHEST, ABDOMEN, AND PELVIS WITH CONTRAST TECHNIQUE: Multidetector CT imaging of the chest, abdomen and pelvis was performed following the standard protocol during bolus administration of intravenous contrast. CONTRAST:  136m OMNIPAQUE IOHEXOL 300 MG/ML  SOLN COMPARISON:  05/01/2015 CT chest, abdomen and pelvis. FINDINGS: CT CHEST Mediastinum/Nodes: Normal heart size. Stable trace anterior pericardial fluid/ thickening. Coronary atherosclerosis. Left subclavian MediPort terminates at the cavoatrial junction. Great vessels are normal in course and caliber. No central pulmonary emboli. Normal visualized thyroid. Normal esophagus. No axillary adenopathy. New mildly enlarged 1.2 cm right paratracheal node (series 2/ image 23), previously 0.8 cm, increased. Mildly enlarged 1.1 cm subcarinal node (series 2/ image 34), previously 1.0 cm, not appreciably changed. Mildly enlarged 1.0 cm right hilar node (series 2/ image 36), previously 1.0 cm, unchanged. Mildly enlarged 1.2 cm right infrahilar node (series 2/image 40) with associated occlusion of the right middle lobe bronchus, previously 1.2 cm, not appreciably changed. No additional pathologically enlarged mediastinal or hilar nodes. Lungs/Pleura: No pneumothorax. No pleural effusion. Moderate  centrilobular and paraseptal emphysema with mild diffuse bronchial wall thickening. Near complete right middle lobe atelectasis. Subsolid 0.8 cm nodule at the periphery of the right middle lobe (series 4/ image 40) is decreased from 1.0 cm, suggesting inflammatory etiology. At least six scattered tiny 3 mm pulmonary nodules in both lungs are unchanged and probably benign. No acute consolidative airspace disease or new significant pulmonary nodules. Musculoskeletal: No aggressive appearing focal osseous lesions. Mild degenerative changes in the thoracic spine. CT ABDOMEN AND PELVIS Hepatobiliary: There is a new 1.2 x 0.9 cm hypodense liver mass in the posterior segment 2 left liver lobe (series 2/ image 57). There is a new subcapsular 0.8 x 0.7 cm hypodense liver mass in the posterior right liver lobe (series 2/ image 72). No additional liver lesions. Normal gallbladder with no radiopaque cholelithiasis. No biliary ductal dilatation. Pancreas: Normal, with no mass or duct dilation. Spleen: Normal size. No mass. Adrenals/Urinary Tract: Hypodense 2.1 x 1.3 cm right adrenal mass (series 2/ image 67), increased from 1.5 x 0.8 cm. Hypodense 1.5 x 1.0 cm left adrenal mass (series 5/ image 6), previously 1.3 x  0.9 cm, mildly increased. Subcentimeter hypodense renal cortical lesion in the posterior interpolar left kidney, too small to characterize, unchanged, probably a benign renal cyst. No hydronephrosis. Normal bladder. Stomach/Bowel: Grossly normal stomach. Normal caliber small bowel with no small bowel wall thickening. Normal appendix. Mild scattered colonic diverticulosis, with no large bowel wall thickening or pericolonic fat stranding. Vascular/Lymphatic: Atherosclerotic nonaneurysmal abdominal aorta. Patent portal, splenic, hepatic and renal veins. No pathologically enlarged lymph nodes in the abdomen or pelvis. Reproductive: Stable moderate prostatomegaly. Other: No pneumoperitoneum, ascites or focal fluid  collection. 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 No additional aggressive appearing bone lesions. Status post bilateral posterior spinal fusion from L4-S1. Stable severe degenerative disc disease in the lower lumbar spine and stable anterolisthesis at L5-S1. IMPRESSION: 1. New mild right paratracheal lymphadenopathy, probably a nodal metastasis. 2. Stable mild right hilar and mild subcarinal lymphadenopathy. Stable near complete right middle lobe atelectasis. Small subsolid nodular focus in the peripheral right middle lobe is decreased and likely inflammatory. 3. Two new small hypodense liver masses, probably liver metastases. 4. 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 5. 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 6. Additional findings include coronary atherosclerosis, moderate emphysema, mild diffuse colonic diverticulosis and moderate prostatomegaly. Electronically Signed   By: Ilona Sorrel M.D.   On: 08/07/2015 13:30   ASSESSMENT AND PLAN:  This is a very pleasant 55 years old [place] American male recently diagnosed with:  1) Extensive stage small cell lung cancer: He is status post 6 cycles of systemic chemotherapy with carboplatin and etoposide. This was followed by prophylactic cranial irradiation.  He has been on observation for the last 18 months.  Unfortunately the recent CT scan of the chest, abdomen and pelvis showed evidence for disease progression including right paratracheal lymphadenopathy in addition to bilateral adrenal gland masses and new small liver lesion as well as bone lesion. I discussed the scan results and showed the images to the patient today. I discussed with him several treatment options including palliative care and hospice referral versus consideration of systemic chemotherapy with carboplatin and  etoposide. The patient would like to take some time to think about his option especially with the high deduction he has with his insurance. I referred him to the financial advocate office today. He will call in the next few days with his decision regarding proceeding with treatment or palliative care.  2) right lower lobe pulmonary embolus: He will continue his Coumadin treatment under the care of his primary care physician.  3) For the weight loss, I recommended for the patient to continue Marinol and I also started him on Medrol Dosepak.  He was advised to call immediately if he has any concerning symptoms in the interval. The patient voices understanding of current disease status and treatment options and is in agreement with the current care plan.  All questions were answered. The patient knows to call the clinic with any problems, questions or concerns. We can certainly see the patient much sooner if necessary.  Disclaimer: This note was dictated with voice recognition software. Similar sounding words can inadvertently be transcribed and may not be corrected upon review.

## 2015-08-13 NOTE — Progress Notes (Signed)
Met with patient in person whom was brought over by MD who had some financial concerns. Patient explained that his cancer has come back and spread and the MD would like to do treatment again. I explained to patient that once treatment plan has been established, if chemotherapy is part of his treatment plan, I will reach out to him to discuss options that may be available. I also explained that most funding is based on diagnosis and if there is any availability but if not, what is left that insurance does not cover can be billed and payment arrangements may be made through billing at a later time. Patient has my card for any additional questions or concerns.

## 2015-08-13 NOTE — Patient Instructions (Signed)
Smoking Cessation, Tips for Success If you are ready to quit smoking, congratulations! You have chosen to help yourself be healthier. Cigarettes bring nicotine, tar, carbon monoxide, and other irritants into your body. Your lungs, heart, and blood vessels will be able to work better without these poisons. There are many different ways to quit smoking. Nicotine gum, nicotine patches, a nicotine inhaler, or nicotine nasal spray can help with physical craving. Hypnosis, support groups, and medicines help break the habit of smoking. WHAT THINGS CAN I DO TO MAKE QUITTING EASIER?  Here are some tips to help you quit for good:  Pick a date when you will quit smoking completely. Tell all of your friends and family about your plan to quit on that date.  Do not try to slowly cut down on the number of cigarettes you are smoking. Pick a quit date and quit smoking completely starting on that day.  Throw away all cigarettes.   Clean and remove all ashtrays from your home, work, and car.  On a card, write down your reasons for quitting. Carry the card with you and read it when you get the urge to smoke.  Cleanse your body of nicotine. Drink enough water and fluids to keep your urine clear or pale yellow. Do this after quitting to flush the nicotine from your body.  Learn to predict your moods. Do not let a bad situation be your excuse to have a cigarette. Some situations in your life might tempt you into wanting a cigarette.  Never have "just one" cigarette. It leads to wanting another and another. Remind yourself of your decision to quit.  Change habits associated with smoking. If you smoked while driving or when feeling stressed, try other activities to replace smoking. Stand up when drinking your coffee. Brush your teeth after eating. Sit in a different chair when you read the paper. Avoid alcohol while trying to quit, and try to drink fewer caffeinated beverages. Alcohol and caffeine may urge you to  smoke.  Avoid foods and drinks that can trigger a desire to smoke, such as sugary or spicy foods and alcohol.  Ask people who smoke not to smoke around you.  Have something planned to do right after eating or having a cup of coffee. For example, plan to take a walk or exercise.  Try a relaxation exercise to calm you down and decrease your stress. Remember, you may be tense and nervous for the first 2 weeks after you quit, but this will pass.  Find new activities to keep your hands busy. Play with a pen, coin, or rubber band. Doodle or draw things on paper.  Brush your teeth right after eating. This will help cut down on the craving for the taste of tobacco after meals. You can also try mouthwash.   Use oral substitutes in place of cigarettes. Try using lemon drops, carrots, cinnamon sticks, or chewing gum. Keep them handy so they are available when you have the urge to smoke.  When you have the urge to smoke, try deep breathing.  Designate your home as a nonsmoking area.  If you are a heavy smoker, ask your health care provider about a prescription for nicotine chewing gum. It can ease your withdrawal from nicotine.  Reward yourself. Set aside the cigarette money you save and buy yourself something nice.  Look for support from others. Join a support group or smoking cessation program. Ask someone at home or at work to help you with your plan   to quit smoking.  Always ask yourself, "Do I need this cigarette or is this just a reflex?" Tell yourself, "Today, I choose not to smoke," or "I do not want to smoke." You are reminding yourself of your decision to quit.  Do not replace cigarette smoking with electronic cigarettes (commonly called e-cigarettes). The safety of e-cigarettes is unknown, and some may contain harmful chemicals.  If you relapse, do not give up! Plan ahead and think about what you will do the next time you get the urge to smoke. HOW WILL I FEEL WHEN I QUIT SMOKING? You  may have symptoms of withdrawal because your body is used to nicotine (the addictive substance in cigarettes). You may crave cigarettes, be irritable, feel very hungry, cough often, get headaches, or have difficulty concentrating. The withdrawal symptoms are only temporary. They are strongest when you first quit but will go away within 10-14 days. When withdrawal symptoms occur, stay in control. Think about your reasons for quitting. Remind yourself that these are signs that your body is healing and getting used to being without cigarettes. Remember that withdrawal symptoms are easier to treat than the major diseases that smoking can cause.  Even after the withdrawal is over, expect periodic urges to smoke. However, these cravings are generally short lived and will go away whether you smoke or not. Do not smoke! WHAT RESOURCES ARE AVAILABLE TO HELP ME QUIT SMOKING? Your health care provider can direct you to community resources or hospitals for support, which may include:  Group support.  Education.  Hypnosis.  Therapy.   This information is not intended to replace advice given to you by your health care provider. Make sure you discuss any questions you have with your health care provider.   Document Released: 02/28/2004 Document Revised: 06/22/2014 Document Reviewed: 11/17/2012 Elsevier Interactive Patient Education 2016 [employer]  

## 2015-09-05 ENCOUNTER — Telehealth: Payer: Self-pay | Admitting: Internal Medicine

## 2015-09-05 NOTE — Telephone Encounter (Signed)
returned call and s.w. pt and sched f/u he is moving soon and needed to s/w MD

## 2015-09-10 ENCOUNTER — Ambulatory Visit: Payer: BLUE CROSS/BLUE SHIELD | Admitting: Internal Medicine

## 2015-09-11 ENCOUNTER — Ambulatory Visit (HOSPITAL_BASED_OUTPATIENT_CLINIC_OR_DEPARTMENT_OTHER): Payer: BLUE CROSS/BLUE SHIELD | Admitting: Internal Medicine

## 2015-09-11 ENCOUNTER — Encounter: Payer: Self-pay | Admitting: Internal Medicine

## 2015-09-11 VITALS — BP 142/93 | HR 95 | Temp 98.0°F | Resp 18 | Ht 73.0 in | Wt 146.1 lb

## 2015-09-11 DIAGNOSIS — R5383 Other fatigue: Secondary | ICD-10-CM | POA: Diagnosis not present

## 2015-09-11 DIAGNOSIS — M549 Dorsalgia, unspecified: Secondary | ICD-10-CM | POA: Diagnosis not present

## 2015-09-11 DIAGNOSIS — C3491 Malignant neoplasm of unspecified part of right bronchus or lung: Secondary | ICD-10-CM | POA: Diagnosis not present

## 2015-09-11 DIAGNOSIS — R634 Abnormal weight loss: Secondary | ICD-10-CM | POA: Diagnosis not present

## 2015-09-11 NOTE — Patient Instructions (Signed)
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 2) there is increase in the size of the lymph nodes in the middle of the chest in addition to spread to the bone in the sacral area. There is also spread of the tumor to the liver and the adrenal glands above the kidneys. 3) your treatment options will include palliative care and hospice or palliative chemotherapy with carboplatin and etoposide for 3 days every 3 weeks for a total of 4-6 cycles. 4) You plan to move to [place] and it may be advisable to establish care with a medical oncologist at [facility] or other oncologist in the [place] area. I will make a referral for you to see an oncologist in [place]. 5) I will order MRI of the brain to rule out any spread of the tumor to the brain. 6) I did not schedule a follow-up appointment for you with me at this point but I'll be happy to see you in the near future if you decide to proceed with chemotherapy here or if you have any other issues.

## 2015-09-11 NOTE — Progress Notes (Signed)
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 2) incidental diagnosis of pulmonary embolism on CT scan of the chest performed on 04/18/2014. Primary site: Lung (Right)  Staging method: AJCC 7th Edition  Clinical: Stage IV (T1a, N1, M1b) signed by Jaime Bears, MD on 12/07/2013 3:19 PM  Summary: Stage IV (T1a, N1, M1b)   PRIOR THERAPY:  1)  Systemic chemotherapy with carboplatin for AUC of 5 given on day 1, etoposide 100 mg/m2 given on days 1, 2 and 3 with neulast support on day 4. Status post 6 cycles.  2) Lovenox 120 mg subcutaneously started 04/18/2014. 3) status post whole brain irradiation under the care of Dr. Sondra Morris completed 06/21/2014  CURRENT THERAPY:  1) systemic chemotherapy again with carboplatin for AUC of 5 on day 1 and etoposide 100 MG/M2 on days 1, 2 and 3 with Neulasta support on day 4. First cycle expected on 09/17/2015. 2) initial treatment with Xarelto 20 mg by mouth daily. First dose of treatment on 05/03/2014. This was switched on 10/01/2014 to Coumadin and followed by primary care physician.  DISEASE STAGE:  Small cell carcinoma of lung, Extensive stage  Primary site: Lung (Right)  Staging method: AJCC 7th Edition  Clinical: Stage IV (T1a, N1, M1b) signed by Jaime Bears, MD on 12/07/2013 3:19 PM  Summary: Stage IV (T1a, N1, M1b)  CHEMOTHERAPY INTENT: pallative  CURRENT # OF CHEMOTHERAPY CYCLES: 0 CURRENT ANTIEMETICS: Zofran, dexamethasone and compazine  CURRENT SMOKING STATUS: Former smoker, quit 12/18/2013  ORAL CHEMOTHERAPY AND CONSENT: n/a  CURRENT BISPHOSPHONATES USE: none  PAIN MANAGEMENT: Lortab 5/325 mg  NARCOTICS INDUCED CONSTIPATION: none  LIVING WILL AND CODE STATUS: ?   INTERVAL HISTORY: [name] 55 y.o. male returns  to the clinic today for followup visit accompanied by a friend. The patient is feeling fine today with no specific complaints except for persistent mild fatigue and pain in the lower back as well as weight loss. He lost a few more pounds since his last visit. He has no chest pain, shortness of breath, cough or hemoptysis. He has no nausea or vomiting. No fever or chills. His recent CT scan of the chest, abdomen and pelvis showed significant evidence for disease progression with increase in the mediastinal lymphadenopathy in addition to liver, adrenal and bone lesions. I discussed with the patient several options for management of his condition during the last visit but he came today for reevaluation and more discussion of his options. He also mentions that he is interested in moving to [place] to be close to his sister. He has a lot of questions today regarding his disease, prognosis and treatment options.  MEDICAL HISTORY: Past Medical History  Diagnosis Date  . HIV infection ([place])     DR. Linus Morris  . Hyperlipidemia   . Insomnia   . Lung nodule, multiple 10/24/13    CT CHEST W/O...DR. Jenny Reichmann Morris  . Gynecomastia, male 10/16/13    MILD RIGHT SIDED PER RADIOLOGY STUDY  . COPD (chronic obstructive pulmonary disease) ([place])     PFT'S 10/26/13 @ [facility]  . History of depression   . History of pneumonia   . Collapsed lung     d/t biopsy, right  . Radiation 06/05/14-06/20/14    whole brain 25 gray  .  Small cell lung cancer ([facility])     ALLERGIES:  is allergic to lactose intolerance (gi).  MEDICATIONS:  Current Outpatient Prescriptions  Medication Sig Dispense Refill  . albuterol (PROAIR HFA) 108 (90 BASE) MCG/ACT inhaler Inhale 2 puffs into the lungs every 4 (four) hours as needed for wheezing or shortness of breath. Reported on 08/13/2015    . ATRIPLA 600-200-300 MG tablet TAKE 1 TABLET BY MOUTH DAILY (Patient not taking: Reported on 08/13/2015) 30 tablet 2  . doxylamine, Sleep, (UNISOM) 25 MG  tablet Take 50 mg by mouth at bedtime as needed for sleep.     Marland Kitchen dronabinol (MARINOL) 10 MG capsule TAKE ONE CAPSULE BY MOUTH TWICE DAILY BEFORE A MEAL 60 capsule 2  . emollient (BIAFINE) cream Apply topically as needed. Reported on 08/13/2015    . fluticasone (FLONASE) 50 MCG/ACT nasal spray Place 2 sprays into both nostrils daily as needed for allergies. Reported on 08/13/2015    . Melatonin 10 MG TABS Take 10 mg by mouth at bedtime as needed (sleep).    . Multiple Vitamin (MULTIVITAMIN) tablet Take 1 tablet by mouth daily.    . Nutritional Supplements (HIGH-PROTEIN NUTRITIONAL SHAKE) LIQD Take 1 each by mouth 2 (two) times daily between meals.    . prochlorperazine (COMPAZINE) 10 MG tablet Take 1 tablet (10 mg total) by mouth every 6 (six) hours as needed for nausea or vomiting. 30 tablet 1  . traZODone (DESYREL) 100 MG tablet Take 100 mg by mouth at bedtime.    . varenicline (CHANTIX) 1 MG tablet Take 1 mg by mouth 2 (two) times daily. Reported on 08/13/2015    . vitamin B-12 (CYANOCOBALAMIN) 500 MCG tablet Take 500 mcg by mouth daily.    Marland Kitchen warfarin (COUMADIN) 5 MG tablet Take 1 tablet (5 mg total) by mouth as directed. 30 tablet 2   No current facility-administered medications for this visit.   Facility-Administered Medications Ordered in Other Visits  Medication Dose Route Frequency Provider Last Rate Last Dose  . sodium chloride 0.9 % injection 10 mL  10 mL Intracatheter PRN Jaime Bears, MD   10 mL at 01/30/14 1451    SURGICAL HISTORY:  Past Surgical History  Procedure Laterality Date  . Foot surgery      BUNIONECTOMY  . Back surgery      L 4-5 FUSION WITH SCREW  . Colonoscopy  UTD  . Portacath placement N/A 12/08/2013    Procedure: INSERTION PORT-A-CATH;  Surgeon: Jaime Isaac, MD;  Location: [facility];  Service: Thoracic;  Laterality: N/A;    REVIEW OF SYSTEMS:  Constitutional: positive for fatigue and weight loss Eyes: negative Ears, nose, mouth, throat, and face:  negative Respiratory: negative Cardiovascular: negative Gastrointestinal: negative Genitourinary:negative Integument/breast: negative Hematologic/lymphatic: negative Musculoskeletal:positive for arthralgias, bone pain and muscle weakness Neurological: negative Behavioral/Psych: negative Endocrine: negative Allergic/Immunologic: negative   PHYSICAL EXAMINATION: General appearance: alert, cooperative, fatigued and no distress Head: Normocephalic, without obvious abnormality, atraumatic Neck: no adenopathy, no JVD, supple, symmetrical, trachea midline and thyroid not enlarged, symmetric, no tenderness/mass/nodules Lymph nodes: Cervical, supraclavicular, and axillary nodes normal. Resp: clear to auscultation bilaterally Back: symmetric, no curvature. ROM normal. No CVA tenderness. Cardio: regular rate and rhythm, S1, S2 normal, no murmur, click, rub or gallop GI: soft, non-tender; bowel sounds normal; no masses,  no organomegaly Extremities: extremities normal, atraumatic, no cyanosis or edema Neurologic: Alert and oriented X 3, normal strength and tone. Normal symmetric reflexes. Normal coordination and gait  ECOG PERFORMANCE STATUS: 1 -  Symptomatic but completely ambulatory  Blood pressure 142/93, pulse 95, temperature 98 F (36.7 C), temperature source Oral, resp. rate 18, height '6\' 1"'$  (1.854 m), weight 146 lb 1.6 oz (66.271 kg), SpO2 100 %.  LABORATORY DATA: Lab Results  Component Value Date   WBC 7.9 08/07/2015   HGB 14.4 08/07/2015   HCT 40.9 08/07/2015   MCV 93.4 08/07/2015   PLT 173 08/07/2015      Chemistry      Component Value Date/Time   NA 138 08/07/2015 1000   NA 134* 01/22/2014 0955   K 3.8 08/07/2015 1000   K 4.2 01/22/2014 0955   CL 102 01/22/2014 0955   CO2 24 08/07/2015 1000   CO2 23 01/22/2014 0955   BUN <4.0* 08/07/2015 1000   BUN 12 01/22/2014 0955   CREATININE 0.7 08/07/2015 1000   CREATININE 0.97 01/22/2014 0955   CREATININE 0.79 10/23/2013 1112       Component Value Date/Time   CALCIUM 9.1 08/07/2015 1000   CALCIUM 9.6 01/22/2014 0955   ALKPHOS 134 08/07/2015 1000   ALKPHOS 138* 01/22/2014 0955   AST 32 08/07/2015 1000   AST 23 01/22/2014 0955   ALT 22 08/07/2015 1000   ALT 28 01/22/2014 0955   BILITOT 1.00 08/07/2015 1000   BILITOT 0.5 01/22/2014 0955       RADIOGRAPHIC STUDIES: No results found. ASSESSMENT AND PLAN:  This is a very pleasant 55 years old African American male recently diagnosed with:  1) Extensive stage small cell lung cancer: He is status post 6 cycles of systemic chemotherapy with carboplatin and etoposide. This was followed by prophylactic cranial irradiation.  He has been on observation for the last 18 months.  Unfortunately the recent CT scan of the chest, abdomen and pelvis showed evidence for disease progression including right paratracheal lymphadenopathy in addition to bilateral adrenal gland masses and new small liver lesion as well as bone lesion. I discussed the scan results and showed the images to the patient again today. I discussed with the patient the option of palliative care and hospice referral versus consideration of systemic chemotherapy with the second course of carboplatin for AUC of 5 on day 1 and etoposide 100 MG/M2 on days 1, 2 and 3 with Neulasta support on day 4. I discussed with them the adverse effect of this treatment including but not limited to alopecia, myelosuppression, nausea and vomiting, peripheral neuropathy, liver or renal dysfunction. He was debating whether to start the systemic chemotherapy in [place] or after moving to [place]. His plan for moving is for May or June.  I recommended for the patient to establish care with a medical oncologist at [facility] or other oncology clinic in [facility] so he would have smooth transfer of his care once he moves. I will make a referral for him to see a medical oncologist in [facility]. I explained to  the patient that at the happy to start his treatment here in [place] The patient has a lot of questions about the treatment and he was unable to make a decision during the visit regarding whether to proceed with treatment or not but he called back immediately after the decision and requested to scheduled the chemotherapy for next week. I will arrange for the patient to receive the first dose of his treatment on 09/17/2015. To complete the staging workup, I will order MRI of the brain to rule out any new metastatic brain lesions.  2) right lower  lobe pulmonary embolus: He will continue his Coumadin treatment under the care of his primary care physician.  3) For the weight loss, I recommended for the patient to continue Marinol.  He was advised to call immediately if he has any concerning symptoms in the interval. The patient voices understanding of current disease status and treatment options and is in agreement with the current care plan.  All questions were answered. The patient knows to call the clinic with any problems, questions or concerns. We can certainly see the patient much sooner if necessary.  Disclaimer: This note was dictated with voice recognition software. Similar sounding words can inadvertently be transcribed and may not be corrected upon review.

## 2015-09-12 ENCOUNTER — Telehealth: Payer: Self-pay | Admitting: Internal Medicine

## 2015-09-12 NOTE — Telephone Encounter (Signed)
s.w. pt advised on April appts....pt ok and aware

## 2015-09-13 ENCOUNTER — Telehealth: Payer: Self-pay | Admitting: Internal Medicine

## 2015-09-13 NOTE — Telephone Encounter (Signed)
Pt moving to [place] this summer so he would like for me to schedule the appt. Closer to July.

## 2015-09-16 ENCOUNTER — Other Ambulatory Visit: Payer: Self-pay | Admitting: *Deleted

## 2015-09-16 ENCOUNTER — Encounter: Payer: Self-pay | Admitting: Pharmacist

## 2015-09-16 ENCOUNTER — Other Ambulatory Visit: Payer: Self-pay | Admitting: Internal Medicine

## 2015-09-16 DIAGNOSIS — C3490 Malignant neoplasm of unspecified part of unspecified bronchus or lung: Secondary | ICD-10-CM

## 2015-09-17 ENCOUNTER — Other Ambulatory Visit: Payer: Self-pay | Admitting: Internal Medicine

## 2015-09-17 ENCOUNTER — Other Ambulatory Visit: Payer: Self-pay | Admitting: *Deleted

## 2015-09-17 ENCOUNTER — Other Ambulatory Visit (HOSPITAL_BASED_OUTPATIENT_CLINIC_OR_DEPARTMENT_OTHER): Payer: BLUE CROSS/BLUE SHIELD

## 2015-09-17 ENCOUNTER — Ambulatory Visit (HOSPITAL_BASED_OUTPATIENT_CLINIC_OR_DEPARTMENT_OTHER): Payer: BLUE CROSS/BLUE SHIELD

## 2015-09-17 VITALS — BP 154/95 | HR 98 | Temp 97.4°F | Resp 18

## 2015-09-17 DIAGNOSIS — Z5111 Encounter for antineoplastic chemotherapy: Secondary | ICD-10-CM | POA: Diagnosis not present

## 2015-09-17 DIAGNOSIS — C3490 Malignant neoplasm of unspecified part of unspecified bronchus or lung: Secondary | ICD-10-CM

## 2015-09-17 DIAGNOSIS — I2699 Other pulmonary embolism without acute cor pulmonale: Secondary | ICD-10-CM

## 2015-09-17 DIAGNOSIS — C3491 Malignant neoplasm of unspecified part of right bronchus or lung: Secondary | ICD-10-CM

## 2015-09-17 LAB — PROTIME-INR
INR: 3.2 (ref 2.00–3.50)
PROTIME: 38.4 s — AB (ref 10.6–13.4)

## 2015-09-17 LAB — CBC WITH DIFFERENTIAL/PLATELET
BASO%: 0.2 % (ref 0.0–2.0)
BASOS ABS: 0 10*3/uL (ref 0.0–0.1)
EOS ABS: 0.1 10*3/uL (ref 0.0–0.5)
EOS%: 0.9 % (ref 0.0–7.0)
HEMATOCRIT: 43.4 % (ref 38.4–49.9)
HEMOGLOBIN: 15.1 g/dL (ref 13.0–17.1)
LYMPH%: 19.8 % (ref 14.0–49.0)
MCH: 32.8 pg (ref 27.2–33.4)
MCHC: 34.8 g/dL (ref 32.0–36.0)
MCV: 94.3 fL (ref 79.3–98.0)
MONO#: 0.7 10*3/uL (ref 0.1–0.9)
MONO%: 8 % (ref 0.0–14.0)
NEUT%: 71.1 % (ref 39.0–75.0)
NEUTROS ABS: 6 10*3/uL (ref 1.5–6.5)
NRBC: 0 % (ref 0–0)
PLATELETS: 238 10*3/uL (ref 140–400)
RBC: 4.6 10*6/uL (ref 4.20–5.82)
RDW: 13.3 % (ref 11.0–14.6)
WBC: 8.5 10*3/uL (ref 4.0–10.3)
lymph#: 1.7 10*3/uL (ref 0.9–3.3)

## 2015-09-17 LAB — COMPREHENSIVE METABOLIC PANEL
ALBUMIN: 3.1 g/dL — AB (ref 3.5–5.0)
ALK PHOS: 150 U/L (ref 40–150)
ALT: 24 U/L (ref 0–55)
ANION GAP: 7 meq/L (ref 3–11)
AST: 39 U/L — ABNORMAL HIGH (ref 5–34)
BILIRUBIN TOTAL: 0.45 mg/dL (ref 0.20–1.20)
BUN: <4 mg/dL — ABNORMAL LOW (ref 7.0–26.0)
CALCIUM: 9.1 mg/dL (ref 8.4–10.4)
CO2: 24 meq/L (ref 22–29)
CREATININE: 0.8 mg/dL (ref 0.7–1.3)
Chloride: 109 mEq/L (ref 98–109)
Glucose: 107 mg/dl (ref 70–140)
Potassium: 3.9 mEq/L (ref 3.5–5.1)
Sodium: 140 mEq/L (ref 136–145)
TOTAL PROTEIN: 6.5 g/dL (ref 6.4–8.3)

## 2015-09-17 MED ORDER — HEPARIN SOD (PORK) LOCK FLUSH 100 UNIT/ML IV SOLN
500.0000 [IU] | Freq: Once | INTRAVENOUS | Status: AC | PRN
  Administered 2015-09-17: 500 [IU]
  Filled 2015-09-17: qty 5

## 2015-09-17 MED ORDER — OXYCODONE-ACETAMINOPHEN 5-325 MG PO TABS
1.0000 | ORAL_TABLET | Freq: Four times a day (QID) | ORAL | Status: AC | PRN
Start: 2015-09-17 — End: ?

## 2015-09-17 MED ORDER — SODIUM CHLORIDE 0.9 % IV SOLN
100.0000 mg/m2 | Freq: Once | INTRAVENOUS | Status: AC
  Administered 2015-09-17: 190 mg via INTRAVENOUS
  Filled 2015-09-17: qty 9.5

## 2015-09-17 MED ORDER — LIDOCAINE-PRILOCAINE 2.5-2.5 % EX CREA: 1.0000 "application " | TOPICAL_CREAM | CUTANEOUS | Status: DC | PRN

## 2015-09-17 MED ORDER — PROCHLORPERAZINE MALEATE 10 MG PO TABS: 10.0000 mg | ORAL_TABLET | Freq: Four times a day (QID) | ORAL | Status: DC | PRN

## 2015-09-17 MED ORDER — CARBOPLATIN CHEMO INTRADERMAL TEST DOSE 100MCG/0.02ML
100.0000 ug | Freq: Once | INTRADERMAL | Status: AC
  Administered 2015-09-17: 100 ug via INTRADERMAL
  Filled 2015-09-17: qty 0.01

## 2015-09-17 MED ORDER — SODIUM CHLORIDE 0.9 % IV SOLN
602.5000 mg | Freq: Once | INTRAVENOUS | Status: AC
  Administered 2015-09-17: 600 mg via INTRAVENOUS
  Filled 2015-09-17: qty 60

## 2015-09-17 MED ORDER — SODIUM CHLORIDE 0.9% FLUSH
10.0000 mL | INTRAVENOUS | Status: DC | PRN
  Administered 2015-09-17: 10 mL
  Filled 2015-09-17: qty 10

## 2015-09-17 MED ORDER — PALONOSETRON HCL INJECTION 0.25 MG/5ML
INTRAVENOUS | Status: AC
  Filled 2015-09-17: qty 5

## 2015-09-17 MED ORDER — SODIUM CHLORIDE 0.9 % IV SOLN
Freq: Once | INTRAVENOUS | Status: AC
  Administered 2015-09-17: 13:00:00 via INTRAVENOUS

## 2015-09-17 MED ORDER — SODIUM CHLORIDE 0.9 % IV SOLN
10.0000 mg | Freq: Once | INTRAVENOUS | Status: AC
  Administered 2015-09-17: 10 mg via INTRAVENOUS
  Filled 2015-09-17: qty 1

## 2015-09-17 MED ORDER — PALONOSETRON HCL INJECTION 0.25 MG/5ML
0.2500 mg | Freq: Once | INTRAVENOUS | Status: AC
  Administered 2015-09-17: 0.25 mg via INTRAVENOUS

## 2015-09-17 NOTE — Patient Instructions (Signed)
[facility] Discharge Instructions for Patients Receiving Chemotherapy  Today you received the following chemotherapy agents: Carboplatin and Etoposide   To help prevent nausea and vomiting after your treatment, we encourage you to take your nausea medication as directed.    If you develop nausea and vomiting that is not controlled by your nausea medication, call the clinic.   BELOW ARE SYMPTOMS THAT SHOULD BE REPORTED IMMEDIATELY:  *FEVER GREATER THAN 100.5 F  *CHILLS WITH OR WITHOUT FEVER  NAUSEA AND VOMITING THAT IS NOT CONTROLLED WITH YOUR NAUSEA MEDICATION  *UNUSUAL SHORTNESS OF BREATH  *UNUSUAL BRUISING OR BLEEDING  TENDERNESS IN MOUTH AND THROAT WITH OR WITHOUT PRESENCE OF ULCERS  *URINARY PROBLEMS  *BOWEL PROBLEMS  UNUSUAL RASH Items with * indicate a potential emergency and should be followed up as soon as possible.  Feel free to call the clinic you have any questions or concerns. The clinic phone number is [phone].  Please show the [facility] at check-in to the Emergency Department and triage nurse.  Carboplatin injection What is this medicine? CARBOPLATIN (KAR boe pla tin) is a chemotherapy drug. It targets fast dividing cells, like cancer cells, and causes these cells to die. This medicine is used to treat ovarian cancer and many other cancers. This medicine may be used for other purposes; ask your health care provider or pharmacist if you have questions. What should I tell my health care provider before I take this medicine? They need to know if you have any of these conditions: -blood disorders -hearing problems -kidney disease -recent or ongoing radiation therapy -an unusual or allergic reaction to carboplatin, cisplatin, other chemotherapy, other medicines, foods, dyes, or preservatives -pregnant or trying to get pregnant -breast-feeding How should I use this medicine? This drug is usually given as an infusion into a vein. It  is administered in a hospital or clinic by a specially trained health care professional. Talk to your pediatrician regarding the use of this medicine in children. Special care may be needed. Overdosage: If you think you have taken too much of this medicine contact a poison control center or emergency room at once. NOTE: This medicine is only for you. Do not share this medicine with others. What if I miss a dose? It is important not to miss a dose. Call your doctor or health care professional if you are unable to keep an appointment. What may interact with this medicine? -medicines for seizures -medicines to increase blood counts like filgrastim, pegfilgrastim, sargramostim -some antibiotics like amikacin, gentamicin, neomycin, streptomycin, tobramycin -vaccines Talk to your doctor or health care professional before taking any of these medicines: -acetaminophen -aspirin -ibuprofen -ketoprofen -naproxen This list may not describe all possible interactions. Give your health care provider a list of all the medicines, herbs, non-prescription drugs, or dietary supplements you use. Also tell them if you smoke, drink alcohol, or use illegal drugs. Some items may interact with your medicine. What should I watch for while using this medicine? Your condition will be monitored carefully while you are receiving this medicine. You will need important blood work done while you are taking this medicine. This drug may make you feel generally unwell. This is not uncommon, as chemotherapy can affect healthy cells as well as cancer cells. Report any side effects. Continue your course of treatment even though you feel ill unless your doctor tells you to stop. In some cases, you may be given additional medicines to help with side effects. Follow all directions  for their use. Call your doctor or health care professional for advice if you get a fever, chills or sore throat, or other symptoms of a cold or flu. Do not  treat yourself. This drug decreases your body's ability to fight infections. Try to avoid being around people who are sick. This medicine may increase your risk to bruise or bleed. Call your doctor or health care professional if you notice any unusual bleeding. Be careful brushing and flossing your teeth or using a toothpick because you may get an infection or bleed more easily. If you have any dental work done, tell your dentist you are receiving this medicine. Avoid taking products that contain aspirin, acetaminophen, ibuprofen, naproxen, or ketoprofen unless instructed by your doctor. These medicines may hide a fever. Do not become pregnant while taking this medicine. Women should inform their doctor if they wish to become pregnant or think they might be pregnant. There is a potential for serious side effects to an unborn child. Talk to your health care professional or pharmacist for more information. Do not breast-feed an infant while taking this medicine. What side effects may I notice from receiving this medicine? Side effects that you should report to your doctor or health care professional as soon as possible: -allergic reactions like skin rash, itching or hives, swelling of the face, lips, or tongue -signs of infection - fever or chills, cough, sore throat, pain or difficulty passing urine -signs of decreased platelets or bleeding - bruising, pinpoint red spots on the skin, black, tarry stools, nosebleeds -signs of decreased red blood cells - unusually weak or tired, fainting spells, lightheadedness -breathing problems -changes in hearing -changes in vision -chest pain -high blood pressure -low blood counts - This drug may decrease the number of white blood cells, red blood cells and platelets. You may be at increased risk for infections and bleeding. -nausea and vomiting -pain, swelling, redness or irritation at the injection site -pain, tingling, numbness in the hands or feet -problems  with balance, talking, walking -trouble passing urine or change in the amount of urine Side effects that usually do not require medical attention (report to your doctor or health care professional if they continue or are bothersome): -hair loss -loss of appetite -metallic taste in the mouth or changes in taste This list may not describe all possible side effects. Call your doctor for medical advice about side effects. You may report side effects to FDA at 1-800-FDA-1088. Where should I keep my medicine? This drug is given in a hospital or clinic and will not be stored at home. NOTE: This sheet is a summary. It may not cover all possible information. If you have questions about this medicine, talk to your doctor, pharmacist, or health care provider.    2016, Elsevier/Gold Standard. (2007-09-06 14:38:05)    Etoposide, VP-16 injection What is this medicine? ETOPOSIDE, VP-16 (e toe POE side) is a chemotherapy drug. It is used to treat testicular cancer, lung cancer, and other cancers. This medicine may be used for other purposes; ask your health care provider or pharmacist if you have questions. What should I tell my health care provider before I take this medicine? They need to know if you have any of these conditions: -infection -kidney disease -low blood counts, like low white cell, platelet, or red cell counts -an unusual or allergic reaction to etoposide, other chemotherapeutic agents, other medicines, foods, dyes, or preservatives -pregnant or trying to get pregnant -breast-feeding How should I use this medicine? This  medicine is for infusion into a vein. It is administered in a hospital or clinic by a specially trained health care professional. Talk to your pediatrician regarding the use of this medicine in children. Special care may be needed. Overdosage: If you think you have taken too much of this medicine contact a poison control center or emergency room at once. NOTE: This  medicine is only for you. Do not share this medicine with others. What if I miss a dose? It is important not to miss your dose. Call your doctor or health care professional if you are unable to keep an appointment. What may interact with this medicine? -aspirin -certain medications for seizures like carbamazepine, phenobarbital, phenytoin, valproic acid -cyclosporine -levamisole -warfarin This list may not describe all possible interactions. Give your health care provider a list of all the medicines, herbs, non-prescription drugs, or dietary supplements you use. Also tell them if you smoke, drink alcohol, or use illegal drugs. Some items may interact with your medicine. What should I watch for while using this medicine? Visit your doctor for checks on your progress. This drug may make you feel generally unwell. This is not uncommon, as chemotherapy can affect healthy cells as well as cancer cells. Report any side effects. Continue your course of treatment even though you feel ill unless your doctor tells you to stop. In some cases, you may be given additional medicines to help with side effects. Follow all directions for their use. Call your doctor or health care professional for advice if you get a fever, chills or sore throat, or other symptoms of a cold or flu. Do not treat yourself. This drug decreases your body's ability to fight infections. Try to avoid being around people who are sick. This medicine may increase your risk to bruise or bleed. Call your doctor or health care professional if you notice any unusual bleeding. Be careful brushing and flossing your teeth or using a toothpick because you may get an infection or bleed more easily. If you have any dental work done, tell your dentist you are receiving this medicine. Avoid taking products that contain aspirin, acetaminophen, ibuprofen, naproxen, or ketoprofen unless instructed by your doctor. These medicines may hide a fever. Do not  become pregnant while taking this medicine or for at least 6 months after stopping it. Women should inform their doctor if they wish to become pregnant or think they might be pregnant. Women of child-bearing potential will need to have a negative pregnancy test before starting this medicine. There is a potential for serious side effects to an unborn child. Talk to your health care professional or pharmacist for more information. Do not breast-feed an infant while taking this medicine. Men must use a latex condom during sexual contact with a woman while taking this medicine and for at least 4 months after stopping it. A latex condom is needed even if you have had a vasectomy. Contact your doctor right away if your partner becomes pregnant. Do not donate sperm while taking this medicine and for at least 4 months after you stop taking this medicine. Men should inform their doctors if they wish to father a child. This medicine may lower sperm counts. What side effects may I notice from receiving this medicine? Side effects that you should report to your doctor or health care professional as soon as possible: -allergic reactions like skin rash, itching or hives, swelling of the face, lips, or tongue -low blood counts - this medicine may decrease the  number of white blood cells, red blood cells and platelets. You may be at increased risk for infections and bleeding. -signs of infection - fever or chills, cough, sore throat, pain or difficulty passing urine -signs of decreased platelets or bleeding - bruising, pinpoint red spots on the skin, black, tarry stools, blood in the urine -signs of decreased red blood cells - unusually weak or tired, fainting spells, lightheadedness -breathing problems -changes in vision -mouth or throat sores or ulcers -pain, redness, swelling or irritation at the injection site -pain, tingling, numbness in the hands or feet -redness, blistering, peeling or loosening of the skin,  including inside the mouth -seizures -vomiting Side effects that usually do not require medical attention (report to your doctor or health care professional if they continue or are bothersome): -diarrhea -hair loss -loss of appetite -nausea -stomach pain This list may not describe all possible side effects. Call your doctor for medical advice about side effects. You may report side effects to FDA at 1-800-FDA-1088. Where should I keep my medicine? This drug is given in a hospital or clinic and will not be stored at home. NOTE: This sheet is a summary. It may not cover all possible information. If you have questions about this medicine, talk to your doctor, pharmacist, or health care provider.    2016, Elsevier/Gold Standard. (2014-01-25 12:32:50)

## 2015-09-17 NOTE — Telephone Encounter (Signed)
Labs reviewed with MD, Pt to discuss all coumadin refills and results with PCP Dr. [name]. Pt given Rx for Percocet for hip pain.

## 2015-09-17 NOTE — Progress Notes (Signed)
Okay to treat with 09/17/15 labs per Dr. Julien Nordmann. Pt to follow up with PCP regarding PT/INR per Dr. Julien Nordmann. Pt verbalizes understanding. Pt given Prescription for home pain medication.

## 2015-09-18 ENCOUNTER — Ambulatory Visit (HOSPITAL_BASED_OUTPATIENT_CLINIC_OR_DEPARTMENT_OTHER): Payer: BLUE CROSS/BLUE SHIELD

## 2015-09-18 VITALS — BP 143/93 | HR 87 | Temp 98.7°F | Resp 18

## 2015-09-18 DIAGNOSIS — C3491 Malignant neoplasm of unspecified part of right bronchus or lung: Secondary | ICD-10-CM

## 2015-09-18 DIAGNOSIS — C3490 Malignant neoplasm of unspecified part of unspecified bronchus or lung: Secondary | ICD-10-CM

## 2015-09-18 DIAGNOSIS — Z5111 Encounter for antineoplastic chemotherapy: Secondary | ICD-10-CM | POA: Diagnosis not present

## 2015-09-18 MED ORDER — SODIUM CHLORIDE 0.9% FLUSH
10.0000 mL | INTRAVENOUS | Status: DC | PRN
  Administered 2015-09-18: 10 mL
  Filled 2015-09-18: qty 10

## 2015-09-18 MED ORDER — HEPARIN SOD (PORK) LOCK FLUSH 100 UNIT/ML IV SOLN
500.0000 [IU] | Freq: Once | INTRAVENOUS | Status: AC | PRN
  Administered 2015-09-18: 500 [IU]
  Filled 2015-09-18: qty 5

## 2015-09-18 MED ORDER — SODIUM CHLORIDE 0.9 % IV SOLN
Freq: Once | INTRAVENOUS | Status: AC
  Administered 2015-09-18: 12:00:00 via INTRAVENOUS

## 2015-09-18 MED ORDER — SODIUM CHLORIDE 0.9 % IV SOLN
10.0000 mg | Freq: Once | INTRAVENOUS | Status: AC
  Administered 2015-09-18: 10 mg via INTRAVENOUS
  Filled 2015-09-18: qty 1

## 2015-09-18 MED ORDER — SODIUM CHLORIDE 0.9 % IV SOLN
100.0000 mg/m2 | Freq: Once | INTRAVENOUS | Status: AC
  Administered 2015-09-18: 190 mg via INTRAVENOUS
  Filled 2015-09-18: qty 9.5

## 2015-09-18 NOTE — Patient Instructions (Addendum)
[facility] Discharge Instructions for Patients Receiving Chemotherapy  Today you received the following chemotherapy agents Etoposide. To help prevent nausea and vomiting after your treatment, we encourage you to take your nausea medication as directed.  If you develop nausea and vomiting that is not controlled by your nausea medication, call the clinic.   BELOW ARE SYMPTOMS THAT SHOULD BE REPORTED IMMEDIATELY:  *FEVER GREATER THAN 100.5 F  *CHILLS WITH OR WITHOUT FEVER  NAUSEA AND VOMITING THAT IS NOT CONTROLLED WITH YOUR NAUSEA MEDICATION  *UNUSUAL SHORTNESS OF BREATH  *UNUSUAL BRUISING OR BLEEDING  TENDERNESS IN MOUTH AND THROAT WITH OR WITHOUT PRESENCE OF ULCERS  *URINARY PROBLEMS  *BOWEL PROBLEMS  UNUSUAL RASH Items with * indicate a potential emergency and should be followed up as soon as possible.  Feel free to call the clinic you have any questions or concerns. The clinic phone number is [phone].  Please show the [facility] at check-in to the Emergency Department and triage nurse.   Low-Sodium Eating Plan Sodium raises blood pressure and causes water to be held in the body. Getting less sodium from food will help lower your blood pressure, reduce any swelling, and protect your heart, liver, and kidneys. We get sodium by adding salt (sodium chloride) to food. Most of our sodium comes from canned, boxed, and frozen foods. Restaurant foods, fast foods, and pizza are also very high in sodium. Even if you take medicine to lower your blood pressure or to reduce fluid in your body, getting less sodium from your food is important. WHAT IS MY PLAN? Most people should limit their sodium intake to 2,300 mg a day. Your health care provider recommends that you limit your sodium intake to __________ a day.  WHAT DO I NEED TO KNOW ABOUT THIS EATING PLAN? For the low-sodium eating plan, you will follow these general guidelines:  Choose foods with a % Daily  Value for sodium of less than 5% (as listed on the food label).   Use salt-free seasonings or herbs instead of table salt or sea salt.   Check with your health care provider or pharmacist before using salt substitutes.   Eat fresh foods.  Eat more vegetables and fruits.  Limit canned vegetables. If you do use them, rinse them well to decrease the sodium.   Limit cheese to 1 oz (28 g) per day.   Eat lower-sodium products, often labeled as "lower sodium" or "no salt added."  Avoid foods that contain monosodium glutamate (MSG). MSG is sometimes added to [place] food and some canned foods.  Check food labels (Nutrition Facts labels) on foods to learn how much sodium is in one serving.  Eat more home-cooked food and less restaurant, buffet, and fast food.  When eating at a restaurant, ask that your food be prepared with less salt, or no salt if possible.  HOW DO I READ FOOD LABELS FOR SODIUM INFORMATION? The Nutrition Facts label lists the amount of sodium in one serving of the food. If you eat more than one serving, you must multiply the listed amount of sodium by the number of servings. Food labels may also identify foods as:  Sodium free--Less than 5 mg in a serving.  Very low sodium--35 mg or less in a serving.  Low sodium--140 mg or less in a serving.  Light in sodium--50% less sodium in a serving. For example, if a food that usually has 300 mg of sodium is changed to become light in  sodium, it will have 150 mg of sodium.  Reduced sodium--25% less sodium in a serving. For example, if a food that usually has 400 mg of sodium is changed to reduced sodium, it will have 300 mg of sodium. WHAT FOODS CAN I EAT? Grains Low-sodium cereals, including oats, puffed wheat and rice, and shredded wheat cereals. Low-sodium crackers. Unsalted rice and pasta. Lower-sodium bread.  Vegetables Frozen or fresh vegetables. Low-sodium or reduced-sodium canned vegetables. Low-sodium or  reduced-sodium tomato sauce and paste. Low-sodium or reduced-sodium tomato and vegetable juices.  Fruits Fresh, frozen, and canned fruit. Fruit juice.  Meat and Other Protein Products Low-sodium canned tuna and salmon. Fresh or frozen meat, poultry, seafood, and fish. Lamb. Unsalted nuts. Dried beans, peas, and lentils without added salt. Unsalted canned beans. Homemade soups without salt. Eggs.  Dairy Milk. Soy milk. Ricotta cheese. Low-sodium or reduced-sodium cheeses. Yogurt.  Condiments Fresh and dried herbs and spices. Salt-free seasonings. Onion and garlic powders. Low-sodium varieties of mustard and ketchup. Fresh or refrigerated horseradish. Lemon juice.  Fats and Oils Reduced-sodium salad dressings. Unsalted butter.  Other Unsalted popcorn and pretzels.  The items listed above may not be a complete list of recommended foods or beverages. Contact your dietitian for more options. WHAT FOODS ARE NOT RECOMMENDED? Grains Instant hot cereals. Bread stuffing, pancake, and biscuit mixes. Croutons. Seasoned rice or pasta mixes. Noodle soup cups. Boxed or frozen macaroni and cheese. Self-rising flour. Regular salted crackers. Vegetables Regular canned vegetables. Regular canned tomato sauce and paste. Regular tomato and vegetable juices. Frozen vegetables in sauces. Salted [place] fries. Olives. Angie Fava. Relishes. Sauerkraut. Salsa. Meat and Other Protein Products Salted, canned, smoked, spiced, or pickled meats, seafood, or fish. Bacon, ham, sausage, hot dogs, corned beef, chipped beef, and packaged luncheon meats. Salt pork. Jerky. Pickled herring. Anchovies, regular canned tuna, and sardines. Salted nuts. Dairy Processed cheese and cheese spreads. Cheese curds. Blue cheese and cottage cheese. Buttermilk.  Condiments Onion and garlic salt, seasoned salt, table salt, and sea salt. Canned and packaged gravies. Worcestershire sauce. Tartar sauce. Barbecue sauce. Teriyaki sauce. Soy  sauce, including reduced sodium. Steak sauce. Fish sauce. Oyster sauce. Cocktail sauce. Horseradish that you find on the shelf. Regular ketchup and mustard. Meat flavorings and tenderizers. Bouillon cubes. Hot sauce. Tabasco sauce. Marinades. Taco seasonings. Relishes. Fats and Oils Regular salad dressings. Salted butter. Margarine. Ghee. Bacon fat.  Other Potato and tortilla chips. Corn chips and puffs. Salted popcorn and pretzels. Canned or dried soups. Pizza. Frozen entrees and pot pies.  The items listed above may not be a complete list of foods and beverages to avoid. Contact your dietitian for more information.   This information is not intended to replace advice given to you by your health care provider. Make sure you discuss any questions you have with your health care provider.   Document Released: 11/21/2001 Document Revised: 06/22/2014 Document Reviewed: 04/05/2013 Elsevier Interactive Patient Education [employer].

## 2015-09-18 NOTE — Progress Notes (Signed)
Regarding HTN pt has no symptoms but was encouraged to reach out to primary care MD who took him off HTN meds and was encouraged to check BP at home.

## 2015-09-19 ENCOUNTER — Other Ambulatory Visit: Payer: Self-pay | Admitting: *Deleted

## 2015-09-19 ENCOUNTER — Ambulatory Visit (HOSPITAL_BASED_OUTPATIENT_CLINIC_OR_DEPARTMENT_OTHER): Payer: BLUE CROSS/BLUE SHIELD

## 2015-09-19 VITALS — BP 141/90 | HR 94 | Temp 97.3°F

## 2015-09-19 DIAGNOSIS — Z5111 Encounter for antineoplastic chemotherapy: Secondary | ICD-10-CM | POA: Diagnosis not present

## 2015-09-19 DIAGNOSIS — C3491 Malignant neoplasm of unspecified part of right bronchus or lung: Secondary | ICD-10-CM

## 2015-09-19 DIAGNOSIS — C3490 Malignant neoplasm of unspecified part of unspecified bronchus or lung: Secondary | ICD-10-CM

## 2015-09-19 MED ORDER — HEPARIN SOD (PORK) LOCK FLUSH 100 UNIT/ML IV SOLN
500.0000 [IU] | Freq: Once | INTRAVENOUS | Status: AC | PRN
  Administered 2015-09-19: 500 [IU]
  Filled 2015-09-19: qty 5

## 2015-09-19 MED ORDER — DEXAMETHASONE SODIUM PHOSPHATE 100 MG/10ML IJ SOLN
10.0000 mg | Freq: Once | INTRAMUSCULAR | Status: AC
  Administered 2015-09-19: 10 mg via INTRAVENOUS
  Filled 2015-09-19: qty 1

## 2015-09-19 MED ORDER — SODIUM CHLORIDE 0.9 % IV SOLN
100.0000 mg/m2 | Freq: Once | INTRAVENOUS | Status: AC
  Administered 2015-09-19: 190 mg via INTRAVENOUS
  Filled 2015-09-19: qty 9.5

## 2015-09-19 MED ORDER — SODIUM CHLORIDE 0.9 % IV SOLN
Freq: Once | INTRAVENOUS | Status: AC
  Administered 2015-09-19: 12:00:00 via INTRAVENOUS

## 2015-09-19 MED ORDER — SODIUM CHLORIDE 0.9% FLUSH
10.0000 mL | INTRAVENOUS | Status: DC | PRN
  Administered 2015-09-19: 10 mL
  Filled 2015-09-19: qty 10

## 2015-09-19 NOTE — Patient Instructions (Signed)
[facility] Discharge Instructions for Patients Receiving Chemotherapy  Today you received the following chemotherapy agents Etoposide.   To help prevent nausea and vomiting after your treatment, we encourage you to take your nausea medication as prescribed.   If you develop nausea and vomiting that is not controlled by your nausea medication, call the clinic.   BELOW ARE SYMPTOMS THAT SHOULD BE REPORTED IMMEDIATELY:  *FEVER GREATER THAN 100.5 F  *CHILLS WITH OR WITHOUT FEVER  NAUSEA AND VOMITING THAT IS NOT CONTROLLED WITH YOUR NAUSEA MEDICATION  *UNUSUAL SHORTNESS OF BREATH  *UNUSUAL BRUISING OR BLEEDING  TENDERNESS IN MOUTH AND THROAT WITH OR WITHOUT PRESENCE OF ULCERS  *URINARY PROBLEMS  *BOWEL PROBLEMS  UNUSUAL RASH Items with * indicate a potential emergency and should be followed up as soon as possible.  Feel free to call the clinic you have any questions or concerns. The clinic phone number is [phone].  Please show the CHEMO ALERT CARD at check-in to the Emergency Department and triage nurse.   

## 2015-09-20 ENCOUNTER — Ambulatory Visit (HOSPITAL_COMMUNITY)
Admission: RE | Admit: 2015-09-20 | Discharge: 2015-09-20 | Disposition: A | Discharge status: Home / Self Care | Payer: BLUE CROSS/BLUE SHIELD | Source: Ambulatory Visit | Attending: Internal Medicine | Admitting: Internal Medicine

## 2015-09-20 DIAGNOSIS — R9089 Other abnormal findings on diagnostic imaging of central nervous system: Secondary | ICD-10-CM | POA: Diagnosis not present

## 2015-09-20 DIAGNOSIS — C3491 Malignant neoplasm of unspecified part of right bronchus or lung: Secondary | ICD-10-CM | POA: Diagnosis not present

## 2015-09-20 MED ORDER — GADOBENATE DIMEGLUMINE 529 MG/ML IV SOLN
15.0000 mL | Freq: Once | INTRAVENOUS | Status: AC | PRN
  Administered 2015-09-20: 13 mL via INTRAVENOUS

## 2015-09-21 ENCOUNTER — Ambulatory Visit: Payer: BLUE CROSS/BLUE SHIELD

## 2015-09-21 VITALS — BP 127/95 | HR 108 | Temp 98.4°F | Resp 20

## 2015-09-21 MED ORDER — PEGFILGRASTIM INJECTION 6 MG/0.6ML ~~LOC~~: 6.0000 mg | PREFILLED_SYRINGE | Freq: Once | SUBCUTANEOUS | Status: DC

## 2015-09-24 ENCOUNTER — Other Ambulatory Visit (HOSPITAL_BASED_OUTPATIENT_CLINIC_OR_DEPARTMENT_OTHER): Payer: BLUE CROSS/BLUE SHIELD

## 2015-09-24 DIAGNOSIS — C3491 Malignant neoplasm of unspecified part of right bronchus or lung: Secondary | ICD-10-CM | POA: Diagnosis not present

## 2015-09-24 LAB — CBC WITH DIFFERENTIAL/PLATELET
BASO%: 0.5 % (ref 0.0–2.0)
Basophils Absolute: 0.1 10*3/uL (ref 0.0–0.1)
EOS%: 0.4 % (ref 0.0–7.0)
Eosinophils Absolute: 0 10*3/uL (ref 0.0–0.5)
HCT: 40.3 % (ref 38.4–49.9)
HGB: 13.6 g/dL (ref 13.0–17.1)
LYMPH%: 9.2 % — AB (ref 14.0–49.0)
MCH: 32.1 pg (ref 27.2–33.4)
MCHC: 33.8 g/dL (ref 32.0–36.0)
MCV: 95.1 fL (ref 79.3–98.0)
MONO#: 0.2 10*3/uL (ref 0.1–0.9)
MONO%: 1.9 % (ref 0.0–14.0)
NEUT%: 88 % — AB (ref 39.0–75.0)
NEUTROS ABS: 10.3 10*3/uL — AB (ref 1.5–6.5)
Platelets: 156 10*3/uL (ref 140–400)
RBC: 4.24 10*6/uL (ref 4.20–5.82)
RDW: 12.8 % (ref 11.0–14.6)
WBC: 11.7 10*3/uL — AB (ref 4.0–10.3)
lymph#: 1.1 10*3/uL (ref 0.9–3.3)

## 2015-09-24 LAB — COMPREHENSIVE METABOLIC PANEL
ALT: 21 U/L (ref 0–55)
AST: 30 U/L (ref 5–34)
Albumin: 3.3 g/dL — ABNORMAL LOW (ref 3.5–5.0)
Alkaline Phosphatase: 162 U/L — ABNORMAL HIGH (ref 40–150)
Anion Gap: 8 mEq/L (ref 3–11)
BILIRUBIN TOTAL: 0.64 mg/dL (ref 0.20–1.20)
BUN: 5.1 mg/dL — AB (ref 7.0–26.0)
CHLORIDE: 100 meq/L (ref 98–109)
CO2: 30 meq/L — AB (ref 22–29)
CREATININE: 0.8 mg/dL (ref 0.7–1.3)
Calcium: 9.7 mg/dL (ref 8.4–10.4)
EGFR: >90 mL/min/{1.73_m2} (ref >90)
GLUCOSE: 102 mg/dL (ref 70–140)
Potassium: 5.1 mEq/L (ref 3.5–5.1)
SODIUM: 138 meq/L (ref 136–145)
TOTAL PROTEIN: 7.2 g/dL (ref 6.4–8.3)

## 2015-09-24 LAB — TECHNOLOGIST REVIEW: Technologist Review: 1

## 2015-09-25 ENCOUNTER — Other Ambulatory Visit: Payer: Self-pay | Admitting: *Deleted

## 2015-09-25 DIAGNOSIS — I2699 Other pulmonary embolism without acute cor pulmonale: Secondary | ICD-10-CM

## 2015-09-25 MED ORDER — WARFARIN SODIUM 5 MG PO TABS: 5.0000 mg | ORAL_TABLET | ORAL | Status: DC

## 2015-09-25 NOTE — Telephone Encounter (Signed)
Rx for [facility] faxed to [facility]

## 2015-09-30 ENCOUNTER — Other Ambulatory Visit: Payer: Self-pay | Admitting: Internal Medicine

## 2015-10-01 ENCOUNTER — Other Ambulatory Visit (HOSPITAL_BASED_OUTPATIENT_CLINIC_OR_DEPARTMENT_OTHER): Payer: BLUE CROSS/BLUE SHIELD

## 2015-10-01 DIAGNOSIS — C3491 Malignant neoplasm of unspecified part of right bronchus or lung: Secondary | ICD-10-CM

## 2015-10-01 LAB — COMPREHENSIVE METABOLIC PANEL
ALK PHOS: 176 U/L — AB (ref 40–150)
ALT: 27 U/L (ref 0–55)
AST: 42 U/L — ABNORMAL HIGH (ref 5–34)
Albumin: 3.1 g/dL — ABNORMAL LOW (ref 3.5–5.0)
Anion Gap: 8 mEq/L (ref 3–11)
BUN: <4 mg/dL — ABNORMAL LOW (ref 7.0–26.0)
CHLORIDE: 102 meq/L (ref 98–109)
CO2: 25 meq/L (ref 22–29)
CREATININE: 0.8 mg/dL (ref 0.7–1.3)
Calcium: 9.2 mg/dL (ref 8.4–10.4)
GLUCOSE: 96 mg/dL (ref 70–140)
POTASSIUM: 4.1 meq/L (ref 3.5–5.1)
SODIUM: 136 meq/L (ref 136–145)
Total Bilirubin: <0.3 mg/dL (ref 0.20–1.20)
Total Protein: 6.7 g/dL (ref 6.4–8.3)

## 2015-10-01 LAB — CBC WITH DIFFERENTIAL/PLATELET
BASO%: 1.3 % (ref 0.0–2.0)
Basophils Absolute: 0.1 10*3/uL (ref 0.0–0.1)
EOS ABS: 0 10*3/uL (ref 0.0–0.5)
EOS%: 0.2 % (ref 0.0–7.0)
HCT: 40.5 % (ref 38.4–49.9)
HGB: 13.9 g/dL (ref 13.0–17.1)
LYMPH%: 19.6 % (ref 14.0–49.0)
MCH: 32.7 pg (ref 27.2–33.4)
MCHC: 34.2 g/dL (ref 32.0–36.0)
MCV: 95.7 fL (ref 79.3–98.0)
MONO#: 0.3 10*3/uL (ref 0.1–0.9)
MONO%: 2.7 % (ref 0.0–14.0)
NEUT%: 76.2 % — ABNORMAL HIGH (ref 39.0–75.0)
NEUTROS ABS: 8.1 10*3/uL — AB (ref 1.5–6.5)
Platelets: 124 10*3/uL — ABNORMAL LOW (ref 140–400)
RBC: 4.23 10*6/uL (ref 4.20–5.82)
RDW: 13.1 % (ref 11.0–14.6)
WBC: 10.6 10*3/uL — AB (ref 4.0–10.3)
lymph#: 2.1 10*3/uL (ref 0.9–3.3)

## 2015-10-08 ENCOUNTER — Ambulatory Visit: Payer: BLUE CROSS/BLUE SHIELD

## 2015-10-08 ENCOUNTER — Other Ambulatory Visit (HOSPITAL_BASED_OUTPATIENT_CLINIC_OR_DEPARTMENT_OTHER): Payer: BLUE CROSS/BLUE SHIELD

## 2015-10-08 ENCOUNTER — Ambulatory Visit (HOSPITAL_BASED_OUTPATIENT_CLINIC_OR_DEPARTMENT_OTHER): Payer: BLUE CROSS/BLUE SHIELD

## 2015-10-08 ENCOUNTER — Encounter: Payer: Self-pay | Admitting: Internal Medicine

## 2015-10-08 ENCOUNTER — Ambulatory Visit (HOSPITAL_BASED_OUTPATIENT_CLINIC_OR_DEPARTMENT_OTHER): Payer: BLUE CROSS/BLUE SHIELD | Admitting: Internal Medicine

## 2015-10-08 VITALS — BP 158/96 | HR 115 | Temp 98.0°F | Resp 18 | Ht 73.0 in | Wt 138.3 lb

## 2015-10-08 DIAGNOSIS — Z5111 Encounter for antineoplastic chemotherapy: Secondary | ICD-10-CM | POA: Diagnosis not present

## 2015-10-08 DIAGNOSIS — K769 Liver disease, unspecified: Secondary | ICD-10-CM

## 2015-10-08 DIAGNOSIS — C3491 Malignant neoplasm of unspecified part of right bronchus or lung: Secondary | ICD-10-CM | POA: Diagnosis not present

## 2015-10-08 DIAGNOSIS — I2699 Other pulmonary embolism without acute cor pulmonale: Secondary | ICD-10-CM

## 2015-10-08 DIAGNOSIS — C3490 Malignant neoplasm of unspecified part of unspecified bronchus or lung: Secondary | ICD-10-CM

## 2015-10-08 DIAGNOSIS — E279 Disorder of adrenal gland, unspecified: Secondary | ICD-10-CM | POA: Diagnosis not present

## 2015-10-08 DIAGNOSIS — Z95828 Presence of other vascular implants and grafts: Secondary | ICD-10-CM | POA: Insufficient documentation

## 2015-10-08 DIAGNOSIS — R634 Abnormal weight loss: Secondary | ICD-10-CM

## 2015-10-08 DIAGNOSIS — C3492 Malignant neoplasm of unspecified part of left bronchus or lung: Secondary | ICD-10-CM

## 2015-10-08 DIAGNOSIS — M899 Disorder of bone, unspecified: Secondary | ICD-10-CM | POA: Diagnosis not present

## 2015-10-08 HISTORY — DX: Encounter for antineoplastic chemotherapy: Z51.11

## 2015-10-08 LAB — COMPREHENSIVE METABOLIC PANEL
ALT: 44 U/L (ref 0–55)
AST: 72 U/L — ABNORMAL HIGH (ref 5–34)
Albumin: 3 g/dL — ABNORMAL LOW (ref 3.5–5.0)
Alkaline Phosphatase: 257 U/L — ABNORMAL HIGH (ref 40–150)
Anion Gap: 11 mEq/L (ref 3–11)
BILIRUBIN TOTAL: 0.31 mg/dL (ref 0.20–1.20)
BUN: <4 mg/dL — ABNORMAL LOW (ref 7.0–26.0)
CHLORIDE: 100 meq/L (ref 98–109)
CO2: 24 meq/L (ref 22–29)
Calcium: 9.1 mg/dL (ref 8.4–10.4)
Creatinine: 0.8 mg/dL (ref 0.7–1.3)
GLUCOSE: 149 mg/dL — AB (ref 70–140)
Potassium: 4 mEq/L (ref 3.5–5.1)
SODIUM: 135 meq/L — AB (ref 136–145)
TOTAL PROTEIN: 6.7 g/dL (ref 6.4–8.3)

## 2015-10-08 LAB — CBC WITH DIFFERENTIAL/PLATELET
BASO%: 0.2 % (ref 0.0–2.0)
Basophils Absolute: 0 10*3/uL (ref 0.0–0.1)
EOS%: 0.2 % (ref 0.0–7.0)
Eosinophils Absolute: 0 10*3/uL (ref 0.0–0.5)
HCT: 40.1 % (ref 38.4–49.9)
HGB: 14.3 g/dL (ref 13.0–17.1)
LYMPH%: 13.5 % — ABNORMAL LOW (ref 14.0–49.0)
MCH: 33.2 pg (ref 27.2–33.4)
MCHC: 35.7 g/dL (ref 32.0–36.0)
MCV: 93 fL (ref 79.3–98.0)
MONO#: 0.6 10*3/uL (ref 0.1–0.9)
MONO%: 5 % (ref 0.0–14.0)
NEUT%: 81.1 % — ABNORMAL HIGH (ref 39.0–75.0)
NEUTROS ABS: 10.5 10*3/uL — AB (ref 1.5–6.5)
NRBC: 0 % (ref 0–0)
Platelets: 301 10*3/uL (ref 140–400)
RBC: 4.31 10*6/uL (ref 4.20–5.82)
RDW: 13.9 % (ref 11.0–14.6)
WBC: 12.9 10*3/uL — AB (ref 4.0–10.3)
lymph#: 1.7 10*3/uL (ref 0.9–3.3)

## 2015-10-08 MED ORDER — SODIUM CHLORIDE 0.9 % IV SOLN
100.0000 mg/m2 | Freq: Once | INTRAVENOUS | Status: AC
  Administered 2015-10-08: 190 mg via INTRAVENOUS
  Filled 2015-10-08: qty 9.5

## 2015-10-08 MED ORDER — PALONOSETRON HCL INJECTION 0.25 MG/5ML
INTRAVENOUS | Status: AC
  Filled 2015-10-08: qty 5

## 2015-10-08 MED ORDER — METHYLPREDNISOLONE 4 MG PO TBPK: ORAL_TABLET | ORAL | Status: DC

## 2015-10-08 MED ORDER — SODIUM CHLORIDE 0.9 % IV SOLN
10.0000 mg | Freq: Once | INTRAVENOUS | Status: AC
  Administered 2015-10-08: 10 mg via INTRAVENOUS
  Filled 2015-10-08: qty 1

## 2015-10-08 MED ORDER — SODIUM CHLORIDE 0.9% FLUSH
10.0000 mL | INTRAVENOUS | Status: DC | PRN
  Administered 2015-10-08: 10 mL
  Filled 2015-10-08: qty 10

## 2015-10-08 MED ORDER — HEPARIN SOD (PORK) LOCK FLUSH 100 UNIT/ML IV SOLN
500.0000 [IU] | Freq: Once | INTRAVENOUS | Status: AC | PRN
  Administered 2015-10-08: 500 [IU]
  Filled 2015-10-08: qty 5

## 2015-10-08 MED ORDER — SODIUM CHLORIDE 0.9 % IJ SOLN
10.0000 mL | INTRAMUSCULAR | Status: DC | PRN
  Administered 2015-10-08: 10 mL via INTRAVENOUS
  Filled 2015-10-08: qty 10

## 2015-10-08 MED ORDER — CARBOPLATIN CHEMO INTRADERMAL TEST DOSE 100MCG/0.02ML
100.0000 ug | Freq: Once | INTRADERMAL | Status: AC
  Administered 2015-10-08: 100 ug via INTRADERMAL
  Filled 2015-10-08: qty 0.01

## 2015-10-08 MED ORDER — PALONOSETRON HCL INJECTION 0.25 MG/5ML
0.2500 mg | Freq: Once | INTRAVENOUS | Status: AC
  Administered 2015-10-08: 0.25 mg via INTRAVENOUS

## 2015-10-08 MED ORDER — SODIUM CHLORIDE 0.9 % IV SOLN
602.5000 mg | Freq: Once | INTRAVENOUS | Status: AC
  Administered 2015-10-08: 600 mg via INTRAVENOUS
  Filled 2015-10-08: qty 60

## 2015-10-08 MED ORDER — SODIUM CHLORIDE 0.9 % IV SOLN
Freq: Once | INTRAVENOUS | Status: AC
  Administered 2015-10-08: 13:00:00 via INTRAVENOUS

## 2015-10-08 NOTE — Patient Instructions (Signed)

## 2015-10-08 NOTE — Progress Notes (Signed)
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 2) incidental diagnosis of pulmonary embolism on CT scan of the chest performed on 04/18/2014. Primary site: Lung (Right)  Staging method: AJCC 7th Edition  Clinical: Stage IV (T1a, N1, M1b) signed by Curt Bears, MD on 12/07/2013 3:19 PM  Summary: Stage IV (T1a, N1, M1b)   PRIOR THERAPY:  1)  Systemic chemotherapy with carboplatin for AUC of 5 given on day 1, etoposide 100 mg/m2 given on days 1, 2 and 3 with neulast support on day 4. Status post 6 cycles.  2) Lovenox 120 mg subcutaneously started 04/18/2014. 3) status post whole brain irradiation under the care of Dr. Sondra Come completed 06/21/2014  CURRENT THERAPY:  1) systemic chemotherapy again with carboplatin for AUC of 5 on day 1 and etoposide 100 MG/M2 on days 1, 2 and 3 with Neulasta support on day 4. First cycle expected on 09/17/2015. 2) initial treatment with Xarelto 20 mg by mouth daily. First dose of treatment on 05/03/2014. This was switched on 10/01/2014 to Coumadin and followed by primary care physician.  DISEASE STAGE:  Small cell carcinoma of lung, Extensive stage  Primary site: Lung (Right)  Staging method: AJCC 7th Edition  Clinical: Stage IV (T1a, N1, M1b) signed by Curt Bears, MD on 12/07/2013 3:19 PM  Summary: Stage IV (T1a, N1, M1b)  CHEMOTHERAPY INTENT: pallative  CURRENT # OF CHEMOTHERAPY CYCLES: 0 CURRENT ANTIEMETICS: Zofran, dexamethasone and compazine  CURRENT SMOKING STATUS: Former smoker, quit 12/18/2013  ORAL CHEMOTHERAPY AND CONSENT: n/a  CURRENT BISPHOSPHONATES USE: none  PAIN MANAGEMENT: Lortab 5/325 mg  NARCOTICS INDUCED CONSTIPATION: none  LIVING WILL AND CODE STATUS: ?   INTERVAL HISTORY: Jaime Morris 55 y.o. male returns  to the clinic today for followup visit accompanied by a friend. The patient is currently undergoing second course of systemic chemotherapy with carboplatin and etoposide. He to the first cycle of his treatment well except for fatigue. He lost 7 more pounds. He is currently on Marinol for appetite stimulation but did not notice any significant change. He denied having any significant chest pain, shortness of breath, cough or hemoptysis. He has no fever or chills. He has no nausea or vomiting. He is here today to start cycle #2 of his treatment. His recent MRI of the brain showed no evidence for brain metastasis.  MEDICAL HISTORY: Past Medical History  Diagnosis Date  . HIV infection ([facility])     DR. [name]  . Hyperlipidemia   . Insomnia   . Lung nodule, multiple 10/24/13    CT CHEST W/O...DR. [name]  . Gynecomastia, male 10/16/13    MILD RIGHT SIDED PER RADIOLOGY STUDY  . COPD (chronic obstructive pulmonary disease) ([facility])     PFT'S 10/26/13 @   . History of depression   . History of pneumonia   . Collapsed lung     d/t biopsy, right  . Radiation 06/05/14-06/20/14    whole brain 25 gray  . Small cell lung cancer ([facility])     ALLERGIES:  is allergic to lactose intolerance (gi).  MEDICATIONS:  Current Outpatient Prescriptions  Medication Sig Dispense Refill  . albuterol (PROAIR HFA) 108 (90 BASE) MCG/ACT inhaler Inhale 2 puffs into the lungs every 4 (four) hours as needed for wheezing or  shortness of breath. Reported on 08/13/2015    . ATRIPLA 600-200-300 MG tablet TAKE 1 TABLET BY MOUTH DAILY (Patient not taking: Reported on 08/13/2015) 30 tablet 2  . doxylamine, Sleep, (UNISOM) 25 MG tablet Take 50 mg by mouth at bedtime as needed for sleep.     Marland Kitchen dronabinol (MARINOL) 10 MG capsule TAKE ONE CAPSULE BY MOUTH TWICE DAILY BEFORE A MEAL 60 capsule 2  . emollient (BIAFINE) cream Apply topically as needed. Reported on 08/13/2015    . fluticasone (FLONASE) 50 MCG/ACT nasal spray Place 2  sprays into both nostrils daily as needed for allergies. Reported on 08/13/2015    . lidocaine-prilocaine (EMLA) cream Apply 1 application topically as needed. 30 g 4  . Melatonin 10 MG TABS Take 10 mg by mouth at bedtime as needed (sleep).    . mirabegron ER (MYRBETRIQ) 25 MG TB24 tablet Take 25 mg by mouth daily.    . Multiple Vitamin (MULTIVITAMIN) tablet Take 1 tablet by mouth daily.    . Nutritional Supplements (HIGH-PROTEIN NUTRITIONAL SHAKE) LIQD Take 1 each by mouth 2 (two) times daily between meals.    Marland Kitchen oxyCODONE-acetaminophen (PERCOCET/ROXICET) 5-325 MG tablet Take 1 tablet by mouth every 6 (six) hours as needed for severe pain. 40 tablet 0  . prochlorperazine (COMPAZINE) 10 MG tablet Take 1 tablet (10 mg total) by mouth every 6 (six) hours as needed for nausea or vomiting. 30 tablet 1  . traZODone (DESYREL) 100 MG tablet Take 100 mg by mouth at bedtime.    . varenicline (CHANTIX) 1 MG tablet Take 1 mg by mouth 2 (two) times daily. Reported on 08/13/2015    . vitamin B-12 (CYANOCOBALAMIN) 500 MCG tablet Take 500 mcg by mouth daily.    Marland Kitchen warfarin (COUMADIN) 5 MG tablet Take 1 tablet (5 mg total) by mouth as directed. 30 tablet 0   No current facility-administered medications for this visit.   Facility-Administered Medications Ordered in Other Visits  Medication Dose Route Frequency Provider Last Rate Last Dose  . sodium chloride 0.9 % injection 10 mL  10 mL Intracatheter PRN Curt Bears, MD   10 mL at 01/30/14 1451  . sodium chloride 0.9 % injection 10 mL  10 mL Intravenous PRN Curt Bears, MD   10 mL at 10/08/15 1131    SURGICAL HISTORY:  Past Surgical History  Procedure Laterality Date  . Foot surgery      BUNIONECTOMY  . Back surgery      L 4-5 FUSION WITH SCREW  . Colonoscopy  UTD  . Portacath placement N/A 12/08/2013    Procedure: INSERTION PORT-A-CATH;  Surgeon: Grace Isaac, MD;  Location: [facility] OR;  Service: Thoracic;  Laterality: N/A;    REVIEW OF SYSTEMS:   A comprehensive review of systems was negative except for: Constitutional: positive for anorexia, fatigue and weight loss   PHYSICAL EXAMINATION: General appearance: alert, cooperative, fatigued and no distress Head: Normocephalic, without obvious abnormality, atraumatic Neck: no adenopathy, no JVD, supple, symmetrical, trachea midline and thyroid not enlarged, symmetric, no tenderness/mass/nodules Lymph nodes: Cervical, supraclavicular, and axillary nodes normal. Resp: clear to auscultation bilaterally Back: symmetric, no curvature. ROM normal. No CVA tenderness. Cardio: regular rate and rhythm, S1, S2 normal, no murmur, click, rub or gallop GI: soft, non-tender; bowel sounds normal; no masses,  no organomegaly Extremities: extremities normal, atraumatic, no cyanosis or edema Neurologic: Alert and oriented X 3, normal strength and tone. Normal symmetric reflexes. Normal coordination and gait  ECOG PERFORMANCE STATUS: 1 -  Symptomatic but completely ambulatory  There were no vitals taken for this visit.  LABORATORY DATA: Lab Results  Component Value Date   WBC 10.6* 10/01/2015   HGB 13.9 10/01/2015   HCT 40.5 10/01/2015   MCV 95.7 10/01/2015   PLT 124* 10/01/2015      Chemistry      Component Value Date/Time   NA 136 10/01/2015 1053   NA 134* 01/22/2014 0955   K 4.1 10/01/2015 1053   K 4.2 01/22/2014 0955   CL 102 01/22/2014 0955   CO2 25 10/01/2015 1053   CO2 23 01/22/2014 0955   BUN <4.0* 10/01/2015 1053   BUN 12 01/22/2014 0955   CREATININE 0.8 10/01/2015 1053   CREATININE 0.97 01/22/2014 0955   CREATININE 0.79 10/23/2013 1112      Component Value Date/Time   CALCIUM 9.2 10/01/2015 1053   CALCIUM 9.6 01/22/2014 0955   ALKPHOS 176* 10/01/2015 1053   ALKPHOS 138* 01/22/2014 0955   AST 42* 10/01/2015 1053   AST 23 01/22/2014 0955   ALT 27 10/01/2015 1053   ALT 28 01/22/2014 0955   BILITOT <0.30 10/01/2015 1053   BILITOT 0.5 01/22/2014 0955       RADIOGRAPHIC  STUDIES: Mr Jeri Cos Wo Contrast  September 21, 2015  CLINICAL DATA:  Small cell carcinoma of the right lung. EXAM: MRI HEAD WITHOUT AND WITH CONTRAST TECHNIQUE: Multiplanar, multiecho pulse sequences of the brain and surrounding structures were obtained without and with intravenous contrast. CONTRAST:  66m MULTIHANCE GADOBENATE DIMEGLUMINE 529 MG/ML IV SOLN COMPARISON:  MRI brain 05/28/2014 FINDINGS: Progressive diffuse periventricular white matter changes are evident bilaterally, compatible with whole brain radiation and chemotherapy. No focal enhancement is present to suggest metastatic disease to the brain. A subtle punctate diffusion abnormality is present in the left centrum semi of ally. This may represent an acute punctate white matter infarct. No other acute infarct is present. There is no hemorrhage or mass lesion. Brainstem and cerebellum are normal. Flow is present in the major intracranial arteries. The globes and orbits are intact. Polyps or mucous retention cysts are present in the left maxillary sinus as before. The remaining paranasal sinuses are clear. Mastoid air cells are clear. IMPRESSION: 1. Progressive diffuse periventricular white matter T2 hyperintensity and atrophy compatible with whole brain radiation and chemotherapy. 2. No evidence for metastatic disease to the brain. 3. Subtle punctate diffusion abnormality in the left centrum semi ovale may represent a tiny subacute white matter infarct. 4. No other acute intracranial abnormality. Electronically Signed   By: CSan MorelleM.D.   On: 008-Apr-201714:21   ASSESSMENT AND PLAN:  This is a very pleasant 55years old [place] male recently diagnosed with:  1) Extensive stage small cell lung cancer: He is status post 6 cycles of systemic chemotherapy with carboplatin and etoposide. This was followed by prophylactic cranial irradiation.  He has been on observation for the last 18 months.  Unfortunately the recent CT scan of the  chest, abdomen and pelvis showed evidence for disease progression including right paratracheal lymphadenopathy in addition to bilateral adrenal gland masses and new small liver lesion as well as bone lesion. He is currently undergoing second course of treatment with chemotherapy was carboplatin and etoposide status post 1 cycle. He tolerated the first cycle of his treatment well with no significant adverse effects except for fatigue. I recommended for him to proceed with cycle #2 today as a scheduled. He would come back for follow-up visit in 3 weeks for  evaluation with repeat CT scan of the chest, abdomen and pelvis for restaging of his disease before starting cycle #3.  2) right lower lobe pulmonary embolus: He will continue his Coumadin treatment under the care of his primary care physician.  3) For the weight loss, I recommended for the patient to continue Marinol. I will also start the patient on Medrol Dosepak.  He was advised to call immediately if he has any concerning symptoms in the interval. The patient voices understanding of current disease status and treatment options and is in agreement with the current care plan.  All questions were answered. The patient knows to call the clinic with any problems, questions or concerns. We can certainly see the patient much sooner if necessary.  Disclaimer: This note was dictated with voice recognition software. Similar sounding words can inadvertently be transcribed and may not be corrected upon review.

## 2015-10-08 NOTE — Patient Instructions (Signed)
[facility] Discharge Instructions for Patients Receiving Chemotherapy  Today you received the following chemotherapy agents :  Carboplatin,  Etoposide.  To help prevent nausea and vomiting after your treatment, we encourage you to take your nausea medication as prescribed.   If you develop nausea and vomiting that is not controlled by your nausea medication, call the clinic.   BELOW ARE SYMPTOMS THAT SHOULD BE REPORTED IMMEDIATELY:  *FEVER GREATER THAN 100.5 F  *CHILLS WITH OR WITHOUT FEVER  NAUSEA AND VOMITING THAT IS NOT CONTROLLED WITH YOUR NAUSEA MEDICATION  *UNUSUAL SHORTNESS OF BREATH  *UNUSUAL BRUISING OR BLEEDING  TENDERNESS IN MOUTH AND THROAT WITH OR WITHOUT PRESENCE OF ULCERS  *URINARY PROBLEMS  *BOWEL PROBLEMS  UNUSUAL RASH Items with * indicate a potential emergency and should be followed up as soon as possible.  Feel free to call the clinic you have any questions or concerns. The clinic phone number is [phone].  Please show the [place] at check-in to the Emergency Department and triage nurse.

## 2015-10-09 ENCOUNTER — Ambulatory Visit (HOSPITAL_BASED_OUTPATIENT_CLINIC_OR_DEPARTMENT_OTHER): Payer: BLUE CROSS/BLUE SHIELD

## 2015-10-09 VITALS — BP 131/85 | HR 110 | Resp 18

## 2015-10-09 DIAGNOSIS — C3490 Malignant neoplasm of unspecified part of unspecified bronchus or lung: Secondary | ICD-10-CM

## 2015-10-09 DIAGNOSIS — Z5111 Encounter for antineoplastic chemotherapy: Secondary | ICD-10-CM | POA: Diagnosis not present

## 2015-10-09 MED ORDER — HEPARIN SOD (PORK) LOCK FLUSH 100 UNIT/ML IV SOLN
500.0000 [IU] | Freq: Once | INTRAVENOUS | Status: AC | PRN
  Administered 2015-10-09: 500 [IU]
  Filled 2015-10-09: qty 5

## 2015-10-09 MED ORDER — SODIUM CHLORIDE 0.9 % IV SOLN
Freq: Once | INTRAVENOUS | Status: AC
  Administered 2015-10-09: 13:00:00 via INTRAVENOUS

## 2015-10-09 MED ORDER — DEXAMETHASONE SODIUM PHOSPHATE 100 MG/10ML IJ SOLN
10.0000 mg | Freq: Once | INTRAMUSCULAR | Status: AC
  Administered 2015-10-09: 10 mg via INTRAVENOUS
  Filled 2015-10-09: qty 1

## 2015-10-09 MED ORDER — SODIUM CHLORIDE 0.9% FLUSH
10.0000 mL | INTRAVENOUS | Status: DC | PRN
  Administered 2015-10-09: 10 mL
  Filled 2015-10-09: qty 10

## 2015-10-09 MED ORDER — SODIUM CHLORIDE 0.9 % IV SOLN
100.0000 mg/m2 | Freq: Once | INTRAVENOUS | Status: AC
  Administered 2015-10-09: 190 mg via INTRAVENOUS
  Filled 2015-10-09: qty 9.5

## 2015-10-09 NOTE — Patient Instructions (Signed)
[facility] Discharge Instructions for Patients Receiving Chemotherapy  Today you received the following chemotherapy agents:  Etoposide  To help prevent nausea and vomiting after your treatment, we encourage you to take your nausea medication.   If you develop nausea and vomiting that is not controlled by your nausea medication, call the clinic.   BELOW ARE SYMPTOMS THAT SHOULD BE REPORTED IMMEDIATELY:  *FEVER GREATER THAN 100.5 F  *CHILLS WITH OR WITHOUT FEVER  NAUSEA AND VOMITING THAT IS NOT CONTROLLED WITH YOUR NAUSEA MEDICATION  *UNUSUAL SHORTNESS OF BREATH  *UNUSUAL BRUISING OR BLEEDING  TENDERNESS IN MOUTH AND THROAT WITH OR WITHOUT PRESENCE OF ULCERS  *URINARY PROBLEMS  *BOWEL PROBLEMS  UNUSUAL RASH Items with * indicate a potential emergency and should be followed up as soon as possible.  Feel free to call the clinic you have any questions or concerns. The clinic phone number is [phone].  Please show the CHEMO ALERT CARD at check-in to the Emergency Department and triage nurse.   

## 2015-10-10 ENCOUNTER — Ambulatory Visit (HOSPITAL_BASED_OUTPATIENT_CLINIC_OR_DEPARTMENT_OTHER): Payer: BLUE CROSS/BLUE SHIELD

## 2015-10-10 VITALS — BP 111/96 | HR 105 | Temp 98.1°F

## 2015-10-10 DIAGNOSIS — Z5111 Encounter for antineoplastic chemotherapy: Secondary | ICD-10-CM | POA: Diagnosis not present

## 2015-10-10 DIAGNOSIS — C3490 Malignant neoplasm of unspecified part of unspecified bronchus or lung: Secondary | ICD-10-CM

## 2015-10-10 MED ORDER — SODIUM CHLORIDE 0.9 % IV SOLN
10.0000 mg | Freq: Once | INTRAVENOUS | Status: AC
  Administered 2015-10-10: 10 mg via INTRAVENOUS
  Filled 2015-10-10: qty 1

## 2015-10-10 MED ORDER — SODIUM CHLORIDE 0.9 % IV SOLN
Freq: Once | INTRAVENOUS | Status: AC
  Administered 2015-10-10: 11:00:00 via INTRAVENOUS

## 2015-10-10 MED ORDER — HEPARIN SOD (PORK) LOCK FLUSH 100 UNIT/ML IV SOLN
500.0000 [IU] | Freq: Once | INTRAVENOUS | Status: AC | PRN
  Administered 2015-10-10: 500 [IU]
  Filled 2015-10-10: qty 5

## 2015-10-10 MED ORDER — SODIUM CHLORIDE 0.9 % IV SOLN
100.0000 mg/m2 | Freq: Once | INTRAVENOUS | Status: AC
  Administered 2015-10-10: 190 mg via INTRAVENOUS
  Filled 2015-10-10: qty 9.5

## 2015-10-10 MED ORDER — SODIUM CHLORIDE 0.9% FLUSH
10.0000 mL | INTRAVENOUS | Status: DC | PRN
  Administered 2015-10-10: 10 mL
  Filled 2015-10-10: qty 10

## 2015-10-10 NOTE — Patient Instructions (Signed)
[facility] Discharge Instructions for Patients Receiving Chemotherapy  Today you received the following chemotherapy agents Etoposide.   To help prevent nausea and vomiting after your treatment, we encourage you to take your nausea medication as prescribed.   If you develop nausea and vomiting that is not controlled by your nausea medication, call the clinic.   BELOW ARE SYMPTOMS THAT SHOULD BE REPORTED IMMEDIATELY:  *FEVER GREATER THAN 100.5 F  *CHILLS WITH OR WITHOUT FEVER  NAUSEA AND VOMITING THAT IS NOT CONTROLLED WITH YOUR NAUSEA MEDICATION  *UNUSUAL SHORTNESS OF BREATH  *UNUSUAL BRUISING OR BLEEDING  TENDERNESS IN MOUTH AND THROAT WITH OR WITHOUT PRESENCE OF ULCERS  *URINARY PROBLEMS  *BOWEL PROBLEMS  UNUSUAL RASH Items with * indicate a potential emergency and should be followed up as soon as possible.  Feel free to call the clinic you have any questions or concerns. The clinic phone number is [phone].  Please show the CHEMO ALERT CARD at check-in to the Emergency Department and triage nurse.   

## 2015-10-12 ENCOUNTER — Ambulatory Visit (HOSPITAL_BASED_OUTPATIENT_CLINIC_OR_DEPARTMENT_OTHER): Payer: BLUE CROSS/BLUE SHIELD

## 2015-10-12 VITALS — BP 152/93 | HR 97 | Temp 97.6°F | Resp 18

## 2015-10-12 DIAGNOSIS — C3490 Malignant neoplasm of unspecified part of unspecified bronchus or lung: Secondary | ICD-10-CM | POA: Diagnosis not present

## 2015-10-12 MED ORDER — PEGFILGRASTIM INJECTION 6 MG/0.6ML ~~LOC~~
6.0000 mg | PREFILLED_SYRINGE | Freq: Once | SUBCUTANEOUS | Status: AC
  Administered 2015-10-12: 6 mg via SUBCUTANEOUS

## 2015-10-12 NOTE — Patient Instructions (Signed)
Pegfilgrastim injection What is this medicine? PEGFILGRASTIM (PEG fil gra stim) is a long-acting granulocyte colony-stimulating factor that stimulates the growth of neutrophils, a type of white blood cell important in the body's fight against infection. It is used to reduce the incidence of fever and infection in patients with certain types of cancer who are receiving chemotherapy that affects the bone marrow, and to increase survival after being exposed to high doses of radiation. This medicine may be used for other purposes; ask your health care provider or pharmacist if you have questions. What should I tell my health care provider before I take this medicine? They need to know if you have any of these conditions: -kidney disease -latex allergy -ongoing radiation therapy -sickle cell disease -skin reactions to acrylic adhesives (On-Body Injector only) -an unusual or allergic reaction to pegfilgrastim, filgrastim, other medicines, foods, dyes, or preservatives -pregnant or trying to get pregnant -breast-feeding How should I use this medicine? This medicine is for injection under the skin. If you get this medicine at home, you will be taught how to prepare and give the pre-filled syringe or how to use the On-body Injector. Refer to the patient Instructions for Use for detailed instructions. Use exactly as directed. Take your medicine at regular intervals. Do not take your medicine more often than directed. It is important that you put your used needles and syringes in a special sharps container. Do not put them in a trash can. If you do not have a sharps container, call your pharmacist or healthcare provider to get one. Talk to your pediatrician regarding the use of this medicine in children. While this drug may be prescribed for selected conditions, precautions do apply. Overdosage: If you think you have taken too much of this medicine contact a poison control center or emergency room at  once. NOTE: This medicine is only for you. Do not share this medicine with others. What if I miss a dose? It is important not to miss your dose. Call your doctor or health care professional if you miss your dose. If you miss a dose due to an On-body Injector failure or leakage, a new dose should be administered as soon as possible using a single prefilled syringe for manual use. What may interact with this medicine? Interactions have not been studied. Give your health care provider a list of all the medicines, herbs, non-prescription drugs, or dietary supplements you use. Also tell them if you smoke, drink alcohol, or use illegal drugs. Some items may interact with your medicine. This list may not describe all possible interactions. Give your health care provider a list of all the medicines, herbs, non-prescription drugs, or dietary supplements you use. Also tell them if you smoke, drink alcohol, or use illegal drugs. Some items may interact with your medicine. What should I watch for while using this medicine? You may need blood work done while you are taking this medicine. If you are going to need a MRI, CT scan, or other procedure, tell your doctor that you are using this medicine (On-Body Injector only). What side effects may I notice from receiving this medicine? Side effects that you should report to your doctor or health care professional as soon as possible: -allergic reactions like skin rash, itching or hives, swelling of the face, lips, or tongue -dizziness -fever -pain, redness, or irritation at site where injected -pinpoint red spots on the skin -red or dark-brown urine -shortness of breath or breathing problems -stomach or side pain, or pain   at the shoulder -swelling -tiredness -trouble passing urine or change in the amount of urine Side effects that usually do not require medical attention (report to your doctor or health care professional if they continue or are  bothersome): -bone pain -muscle pain This list may not describe all possible side effects. Call your doctor for medical advice about side effects. You may report side effects to FDA at 1-800-FDA-1088. Where should I keep my medicine? Keep out of the reach of children. Store pre-filled syringes in a refrigerator between 2 and 8 degrees C (36 and 46 degrees F). Do not freeze. Keep in carton to protect from light. Throw away this medicine if it is left out of the refrigerator for more than 48 hours. Throw away any unused medicine after the expiration date. NOTE: This sheet is a summary. It may not cover all possible information. If you have questions about this medicine, talk to your doctor, pharmacist, or health care provider.    2016, Elsevier/Gold Standard. (2014-06-21 14:30:14)  

## 2015-10-15 ENCOUNTER — Other Ambulatory Visit (HOSPITAL_BASED_OUTPATIENT_CLINIC_OR_DEPARTMENT_OTHER): Payer: BLUE CROSS/BLUE SHIELD

## 2015-10-15 DIAGNOSIS — C3491 Malignant neoplasm of unspecified part of right bronchus or lung: Secondary | ICD-10-CM

## 2015-10-15 DIAGNOSIS — C3490 Malignant neoplasm of unspecified part of unspecified bronchus or lung: Secondary | ICD-10-CM

## 2015-10-15 LAB — CBC WITH DIFFERENTIAL/PLATELET
BASO%: 0.2 % (ref 0.0–2.0)
BASOS ABS: 0.1 10*3/uL (ref 0.0–0.1)
EOS ABS: 0 10*3/uL (ref 0.0–0.5)
EOS%: 0 % (ref 0.0–7.0)
HEMATOCRIT: 35.6 % — AB (ref 38.4–49.9)
HEMOGLOBIN: 12 g/dL — AB (ref 13.0–17.1)
LYMPH#: 1.6 10*3/uL (ref 0.9–3.3)
LYMPH%: 7.2 % — ABNORMAL LOW (ref 14.0–49.0)
MCH: 32.7 pg (ref 27.2–33.4)
MCHC: 33.7 g/dL (ref 32.0–36.0)
MCV: 97 fL (ref 79.3–98.0)
MONO#: 0.6 10*3/uL (ref 0.1–0.9)
MONO%: 2.8 % (ref 0.0–14.0)
NEUT#: 19.4 10*3/uL — ABNORMAL HIGH (ref 1.5–6.5)
NEUT%: 89.8 % — ABNORMAL HIGH (ref 39.0–75.0)
Platelets: 237 10*3/uL (ref 140–400)
RBC: 3.67 10*6/uL — ABNORMAL LOW (ref 4.20–5.82)
RDW: 14 % (ref 11.0–14.6)
WBC: 21.6 10*3/uL — AB (ref 4.0–10.3)

## 2015-10-15 LAB — COMPREHENSIVE METABOLIC PANEL
ALBUMIN: 3.2 g/dL — AB (ref 3.5–5.0)
ALK PHOS: 189 U/L — AB (ref 40–150)
ALT: 28 U/L (ref 0–55)
AST: 27 U/L (ref 5–34)
Anion Gap: 7 mEq/L (ref 3–11)
BILIRUBIN TOTAL: 0.55 mg/dL (ref 0.20–1.20)
BUN: 5.2 mg/dL — AB (ref 7.0–26.0)
CALCIUM: 9.3 mg/dL (ref 8.4–10.4)
CO2: 29 mEq/L (ref 22–29)
Chloride: 106 mEq/L (ref 98–109)
Creatinine: 0.7 mg/dL (ref 0.7–1.3)
Glucose: 97 mg/dl (ref 70–140)
Potassium: 4.4 mEq/L (ref 3.5–5.1)
Sodium: 141 mEq/L (ref 136–145)
Total Protein: 7 g/dL (ref 6.4–8.3)

## 2015-10-22 ENCOUNTER — Other Ambulatory Visit: Payer: Self-pay | Admitting: Internal Medicine

## 2015-10-22 ENCOUNTER — Other Ambulatory Visit (HOSPITAL_BASED_OUTPATIENT_CLINIC_OR_DEPARTMENT_OTHER): Payer: BLUE CROSS/BLUE SHIELD

## 2015-10-22 DIAGNOSIS — C3490 Malignant neoplasm of unspecified part of unspecified bronchus or lung: Secondary | ICD-10-CM

## 2015-10-22 DIAGNOSIS — C3491 Malignant neoplasm of unspecified part of right bronchus or lung: Secondary | ICD-10-CM

## 2015-10-22 LAB — COMPREHENSIVE METABOLIC PANEL
ALBUMIN: 3.3 g/dL — AB (ref 3.5–5.0)
ALT: 19 U/L (ref 0–55)
ANION GAP: 9 meq/L (ref 3–11)
AST: 24 U/L (ref 5–34)
Alkaline Phosphatase: 180 U/L — ABNORMAL HIGH (ref 40–150)
BUN: 4.2 mg/dL — ABNORMAL LOW (ref 7.0–26.0)
CALCIUM: 9.3 mg/dL (ref 8.4–10.4)
CO2: 27 meq/L (ref 22–29)
Chloride: 105 mEq/L (ref 98–109)
Creatinine: 0.8 mg/dL (ref 0.7–1.3)
GLUCOSE: 120 mg/dL (ref 70–140)
Potassium: 4.5 mEq/L (ref 3.5–5.1)
Sodium: 141 mEq/L (ref 136–145)
Total Protein: 6.8 g/dL (ref 6.4–8.3)

## 2015-10-22 LAB — CBC WITH DIFFERENTIAL/PLATELET
BASO%: 1 % (ref 0.0–2.0)
BASOS ABS: 0.2 10*3/uL — AB (ref 0.0–0.1)
EOS ABS: 0 10*3/uL (ref 0.0–0.5)
EOS%: 0.1 % (ref 0.0–7.0)
HCT: 36.7 % — ABNORMAL LOW (ref 38.4–49.9)
HGB: 12.4 g/dL — ABNORMAL LOW (ref 13.0–17.1)
LYMPH%: 14.4 % (ref 14.0–49.0)
MCH: 32.6 pg (ref 27.2–33.4)
MCHC: 33.8 g/dL (ref 32.0–36.0)
MCV: 96.6 fL (ref 79.3–98.0)
MONO#: 0.6 10*3/uL (ref 0.1–0.9)
MONO%: 3.4 % (ref 0.0–14.0)
NEUT#: 13.3 10*3/uL — ABNORMAL HIGH (ref 1.5–6.5)
NEUT%: 81.1 % — AB (ref 39.0–75.0)
Platelets: 86 10*3/uL — ABNORMAL LOW (ref 140–400)
RBC: 3.8 10*6/uL — AB (ref 4.20–5.82)
RDW: 15.4 % — ABNORMAL HIGH (ref 11.0–14.6)
WBC: 16.4 10*3/uL — ABNORMAL HIGH (ref 4.0–10.3)
lymph#: 2.4 10*3/uL (ref 0.9–3.3)
nRBC: 1 % — ABNORMAL HIGH (ref 0–0)

## 2015-10-28 ENCOUNTER — Encounter (HOSPITAL_COMMUNITY): Payer: Self-pay

## 2015-10-28 ENCOUNTER — Ambulatory Visit (HOSPITAL_COMMUNITY)
Admission: RE | Admit: 2015-10-28 | Discharge: 2015-10-28 | Disposition: A | Discharge status: Home / Self Care | Payer: BLUE CROSS/BLUE SHIELD | Source: Ambulatory Visit | Attending: Internal Medicine | Admitting: Internal Medicine

## 2015-10-28 DIAGNOSIS — Z9221 Personal history of antineoplastic chemotherapy: Secondary | ICD-10-CM | POA: Diagnosis not present

## 2015-10-28 DIAGNOSIS — K769 Liver disease, unspecified: Secondary | ICD-10-CM | POA: Insufficient documentation

## 2015-10-28 DIAGNOSIS — C7951 Secondary malignant neoplasm of bone: Secondary | ICD-10-CM | POA: Diagnosis not present

## 2015-10-28 DIAGNOSIS — E279 Disorder of adrenal gland, unspecified: Secondary | ICD-10-CM | POA: Insufficient documentation

## 2015-10-28 DIAGNOSIS — R918 Other nonspecific abnormal finding of lung field: Secondary | ICD-10-CM | POA: Insufficient documentation

## 2015-10-28 DIAGNOSIS — C3492 Malignant neoplasm of unspecified part of left bronchus or lung: Secondary | ICD-10-CM | POA: Diagnosis present

## 2015-10-28 DIAGNOSIS — R911 Solitary pulmonary nodule: Secondary | ICD-10-CM | POA: Diagnosis not present

## 2015-10-28 DIAGNOSIS — Z5111 Encounter for antineoplastic chemotherapy: Secondary | ICD-10-CM

## 2015-10-28 MED ORDER — IOPAMIDOL (ISOVUE-300) INJECTION 61%
100.0000 mL | Freq: Once | INTRAVENOUS | Status: AC | PRN
  Administered 2015-10-28: 100 mL via INTRAVENOUS

## 2015-10-29 ENCOUNTER — Ambulatory Visit: Payer: BLUE CROSS/BLUE SHIELD

## 2015-10-29 ENCOUNTER — Telehealth: Payer: Self-pay | Admitting: *Deleted

## 2015-10-29 ENCOUNTER — Encounter: Payer: Self-pay | Admitting: Internal Medicine

## 2015-10-29 ENCOUNTER — Other Ambulatory Visit (HOSPITAL_BASED_OUTPATIENT_CLINIC_OR_DEPARTMENT_OTHER): Payer: BLUE CROSS/BLUE SHIELD

## 2015-10-29 ENCOUNTER — Telehealth: Payer: Self-pay | Admitting: Internal Medicine

## 2015-10-29 ENCOUNTER — Ambulatory Visit (HOSPITAL_BASED_OUTPATIENT_CLINIC_OR_DEPARTMENT_OTHER): Payer: BLUE CROSS/BLUE SHIELD | Admitting: Internal Medicine

## 2015-10-29 VITALS — BP 130/73 | HR 107 | Temp 98.4°F | Resp 18 | Ht 73.0 in | Wt 143.0 lb

## 2015-10-29 DIAGNOSIS — I2699 Other pulmonary embolism without acute cor pulmonale: Secondary | ICD-10-CM

## 2015-10-29 DIAGNOSIS — R634 Abnormal weight loss: Secondary | ICD-10-CM | POA: Diagnosis not present

## 2015-10-29 DIAGNOSIS — C3491 Malignant neoplasm of unspecified part of right bronchus or lung: Secondary | ICD-10-CM

## 2015-10-29 DIAGNOSIS — Z95828 Presence of other vascular implants and grafts: Secondary | ICD-10-CM

## 2015-10-29 DIAGNOSIS — C3490 Malignant neoplasm of unspecified part of unspecified bronchus or lung: Secondary | ICD-10-CM | POA: Diagnosis not present

## 2015-10-29 DIAGNOSIS — Z5111 Encounter for antineoplastic chemotherapy: Secondary | ICD-10-CM

## 2015-10-29 DIAGNOSIS — C3492 Malignant neoplasm of unspecified part of left bronchus or lung: Secondary | ICD-10-CM

## 2015-10-29 LAB — CBC WITH DIFFERENTIAL/PLATELET
BASO%: 0.4 % (ref 0.0–2.0)
BASOS ABS: 0 10*3/uL (ref 0.0–0.1)
EOS ABS: 0 10*3/uL (ref 0.0–0.5)
EOS%: 0.3 % (ref 0.0–7.0)
HEMATOCRIT: 31.9 % — AB (ref 38.4–49.9)
HGB: 10.6 g/dL — ABNORMAL LOW (ref 13.0–17.1)
LYMPH#: 1.8 10*3/uL (ref 0.9–3.3)
LYMPH%: 15.5 % (ref 14.0–49.0)
MCH: 32.3 pg (ref 27.2–33.4)
MCHC: 33.4 g/dL (ref 32.0–36.0)
MCV: 96.9 fL (ref 79.3–98.0)
MONO#: 0.8 10*3/uL (ref 0.1–0.9)
MONO%: 7.3 % (ref 0.0–14.0)
NEUT#: 8.8 10*3/uL — ABNORMAL HIGH (ref 1.5–6.5)
NEUT%: 76.5 % — AB (ref 39.0–75.0)
PLATELETS: 409 10*3/uL — AB (ref 140–400)
RBC: 3.29 10*6/uL — ABNORMAL LOW (ref 4.20–5.82)
RDW: 15.5 % — ABNORMAL HIGH (ref 11.0–14.6)
WBC: 11.5 10*3/uL — ABNORMAL HIGH (ref 4.0–10.3)

## 2015-10-29 LAB — COMPREHENSIVE METABOLIC PANEL
ALT: 15 U/L (ref 0–55)
AST: 18 U/L (ref 5–34)
Albumin: 3 g/dL — ABNORMAL LOW (ref 3.5–5.0)
Alkaline Phosphatase: 140 U/L (ref 40–150)
Anion Gap: 5 mEq/L (ref 3–11)
BUN: <4 mg/dL — ABNORMAL LOW (ref 7.0–26.0)
CO2: 26 meq/L (ref 22–29)
CREATININE: 0.7 mg/dL (ref 0.7–1.3)
Calcium: 8.4 mg/dL (ref 8.4–10.4)
Chloride: 105 mEq/L (ref 98–109)
EGFR: >90 mL/min/{1.73_m2} (ref >90)
GLUCOSE: 99 mg/dL (ref 70–140)
Potassium: 4.3 mEq/L (ref 3.5–5.1)
Sodium: 136 mEq/L (ref 136–145)
Total Protein: 6.2 g/dL — ABNORMAL LOW (ref 6.4–8.3)

## 2015-10-29 MED ORDER — SODIUM CHLORIDE 0.9 % IJ SOLN
10.0000 mL | INTRAMUSCULAR | Status: DC | PRN
  Administered 2015-10-29: 10 mL via INTRAVENOUS
  Filled 2015-10-29: qty 10

## 2015-10-29 NOTE — Telephone Encounter (Signed)
Gave pt apt & avs °

## 2015-10-29 NOTE — Progress Notes (Signed)
[place] [phone]) [phone]   Fax:([phone]) [phone]  OFFICE PROGRESS NOTE  Irven Shelling, MD [address] [place] [address]  DIAGNOSIS:  1) Small cell carcinoma of lung, Extensive stage diagnosed in June 2015. 2) incidental diagnosis of pulmonary embolism on CT scan of the chest performed on 04/18/2014. Primary site: Lung (Right)  Staging method: AJCC 7th Edition  Clinical: Stage IV (T1a, N1, M1b) signed by Curt Bears, MD on 12/07/2013 3:19 PM  Summary: Stage IV (T1a, N1, M1b)   PRIOR THERAPY:  1)  Systemic chemotherapy with carboplatin for AUC of 5 given on day 1, etoposide 100 mg/m2 given on days 1, 2 and 3 with neulast support on day 4. Status post 6 cycles.  2) Lovenox 120 mg subcutaneously started 04/18/2014. 3) status post whole brain irradiation under the care of Dr. Sondra Come completed 06/21/2014. 4) systemic chemotherapy again with carboplatin for AUC of 5 on day 1 and etoposide 100 MG/M2 on days 1, 2 and 3 with Neulasta support on day 4. First cycle expected on 09/17/2015. Status post 2 cycles discontinued secondary to disease progression.  CURRENT THERAPY:  1) systemic chemotherapy with cisplatin 70 MG/M2 and irinotecan 65 MG/M2 on days 1 and 8 every 3 weeks. First dose 11/07/2015. 2) initial treatment with Xarelto 20 mg by mouth daily. First dose of treatment on 05/03/2014. This was switched on 10/01/2014 to Coumadin and followed by primary care physician.  DISEASE STAGE:  Small cell carcinoma of lung, Extensive stage  Primary site: Lung (Right)  Staging method: AJCC 7th Edition  Clinical: Stage IV (T1a, N1, M1b) signed by Curt Bears, MD on 12/07/2013 3:19 PM  Summary: Stage IV (T1a, N1, M1b)  CHEMOTHERAPY INTENT: pallative  CURRENT # OF CHEMOTHERAPY CYCLES: 1 CURRENT ANTIEMETICS: Zofran, dexamethasone and compazine  CURRENT SMOKING STATUS: Former smoker, quit 12/18/2013  ORAL CHEMOTHERAPY AND CONSENT: n/a    CURRENT BISPHOSPHONATES USE: none  PAIN MANAGEMENT: Lortab 5/325 mg  NARCOTICS INDUCED CONSTIPATION: none  LIVING WILL AND CODE STATUS: ?   INTERVAL HISTORY: Jaime Morris 55 y.o. male returns to the clinic today for followup visit. The patient completed 2 cycles of systemic chemotherapy with carboplatin and etoposide and tolerated the treatment well except for fatigue.he was able to maintain his weight stable over the last few weeks. He continues to complain of pain in the lower back with radiation to the left hip area. He denied having any significant chest pain, shortness of breath, cough or hemoptysis. He has no fever or chills. He has no nausea or vomiting. He had repeat CT scan of the chest, abdomen and pelvis performed recently and he is here for evaluation and discussion of his scan results.  MEDICAL HISTORY: Past Medical History  Diagnosis Date  . HIV infection ([facility])     DR. [name]  . Hyperlipidemia   . Insomnia   . Lung nodule, multiple 10/24/13    CT CHEST W/O...DR. [name]  . Gynecomastia, male 10/16/13    MILD RIGHT SIDED PER RADIOLOGY STUDY  . COPD (chronic obstructive pulmonary disease) ([facility])     PFT'S 10/26/13 @ [facility]  . History of depression   . History of pneumonia   . Collapsed lung     d/t biopsy, right  . Radiation 06/05/14-06/20/14    whole brain 25 gray  . Encounter for antineoplastic chemotherapy 10/08/2015  . Small cell lung cancer ([facility])     ALLERGIES:  is allergic  to lactose intolerance (gi).  MEDICATIONS:  Current Outpatient Prescriptions  Medication Sig Dispense Refill  . albuterol (PROAIR HFA) 108 (90 BASE) MCG/ACT inhaler Inhale 2 puffs into the lungs every 4 (four) hours as needed for wheezing or shortness of breath. Reported on 08/13/2015    . ATRIPLA 600-200-300 MG tablet TAKE 1 TABLET BY MOUTH DAILY 30 tablet 2  . doxylamine, Sleep, (UNISOM) 25 MG tablet Take 50 mg by mouth at bedtime as needed for sleep.     Marland Kitchen dronabinol (MARINOL) 10  MG capsule TAKE ONE CAPSULE BY MOUTH TWICE DAILY BEFORE A MEAL 60 capsule 2  . emollient (BIAFINE) cream Apply topically as needed. Reported on 08/13/2015    . fluticasone (FLONASE) 50 MCG/ACT nasal spray Place 2 sprays into both nostrils daily as needed for allergies. Reported on 08/13/2015    . lidocaine-prilocaine (EMLA) cream Apply 1 application topically as needed. 30 g 4  . Melatonin 10 MG TABS Take 10 mg by mouth at bedtime as needed (sleep).    . Multiple Vitamin (MULTIVITAMIN) tablet Take 1 tablet by mouth daily.    . Nutritional Supplements (HIGH-PROTEIN NUTRITIONAL SHAKE) LIQD Take 1 each by mouth 2 (two) times daily between meals.    Marland Kitchen oxyCODONE-acetaminophen (PERCOCET/ROXICET) 5-325 MG tablet Take 1 tablet by mouth every 6 (six) hours as needed for severe pain. 40 tablet 0  . prochlorperazine (COMPAZINE) 10 MG tablet Take 1 tablet (10 mg total) by mouth every 6 (six) hours as needed for nausea or vomiting. 30 tablet 1  . traZODone (DESYREL) 100 MG tablet Take 100 mg by mouth at bedtime.    . varenicline (CHANTIX) 1 MG tablet Take 1 mg by mouth 2 (two) times daily. Reported on 08/13/2015    . vitamin B-12 (CYANOCOBALAMIN) 500 MCG tablet Take 500 mcg by mouth daily.    Marland Kitchen warfarin (COUMADIN) 5 MG tablet Take 1 tablet (5 mg total) by mouth as directed. 30 tablet 0  . mirabegron ER (MYRBETRIQ) 25 MG TB24 tablet Take 25 mg by mouth daily. Reported on 10/29/2015    . tolterodine (DETROL LA) 4 MG 24 hr capsule TK 1 C PO QD  5   No current facility-administered medications for this visit.   Facility-Administered Medications Ordered in Other Visits  Medication Dose Route Frequency Provider Last Rate Last Dose  . sodium chloride 0.9 % injection 10 mL  10 mL Intracatheter PRN Curt Bears, MD   10 mL at 01/30/14 1451    SURGICAL HISTORY:  Past Surgical History  Procedure Laterality Date  . Foot surgery      BUNIONECTOMY  . Back surgery      L 4-5 FUSION WITH SCREW  . Colonoscopy  UTD    . Portacath placement N/A 12/08/2013    Procedure: INSERTION PORT-A-CATH;  Surgeon: Grace Isaac, MD;  Location: [facility];  Service: Thoracic;  Laterality: N/A;    REVIEW OF SYSTEMS:  Constitutional: positive for fatigue Eyes: negative Ears, nose, mouth, throat, and face: negative Respiratory: negative Cardiovascular: negative Gastrointestinal: negative Genitourinary:negative Integument/breast: negative Hematologic/lymphatic: negative Musculoskeletal:positive for back pain Neurological: negative Behavioral/Psych: negative Endocrine: negative Allergic/Immunologic: negative   PHYSICAL EXAMINATION: General appearance: alert, cooperative, fatigued and no distress Head: Normocephalic, without obvious abnormality, atraumatic Neck: no adenopathy, no JVD, supple, symmetrical, trachea midline and thyroid not enlarged, symmetric, no tenderness/mass/nodules Lymph nodes: Cervical, supraclavicular, and axillary nodes normal. Resp: clear to auscultation bilaterally Back: symmetric, no curvature. ROM normal. No CVA tenderness. Cardio: regular rate and rhythm,  S1, S2 normal, no murmur, click, rub or gallop GI: soft, non-tender; bowel sounds normal; no masses,  no organomegaly Extremities: extremities normal, atraumatic, no cyanosis or edema Neurologic: Alert and oriented X 3, normal strength and tone. Normal symmetric reflexes. Normal coordination and gait  ECOG PERFORMANCE STATUS: 1 - Symptomatic but completely ambulatory  Blood pressure 130/73, pulse 107, temperature 98.4 F (36.9 C), temperature source Oral, resp. rate 18, height '6\' 1"'$  (1.854 m), weight 143 lb (64.864 kg), SpO2 100 %.  LABORATORY DATA: Lab Results  Component Value Date   WBC 11.5* 10/29/2015   HGB 10.6* 10/29/2015   HCT 31.9* 10/29/2015   MCV 96.9 10/29/2015   PLT 409* 10/29/2015      Chemistry      Component Value Date/Time   NA 141 10/22/2015 1048   NA 134* 01/22/2014 0955   K 4.5 10/22/2015 1048   K 4.2  01/22/2014 0955   CL 102 01/22/2014 0955   CO2 27 10/22/2015 1048   CO2 23 01/22/2014 0955   BUN 4.2* 10/22/2015 1048   BUN 12 01/22/2014 0955   CREATININE 0.8 10/22/2015 1048   CREATININE 0.97 01/22/2014 0955   CREATININE 0.79 10/23/2013 1112      Component Value Date/Time   CALCIUM 9.3 10/22/2015 1048   CALCIUM 9.6 01/22/2014 0955   ALKPHOS 180* 10/22/2015 1048   ALKPHOS 138* 01/22/2014 0955   AST 24 10/22/2015 1048   AST 23 01/22/2014 0955   ALT 19 10/22/2015 1048   ALT 28 01/22/2014 0955   BILITOT <0.30 10/22/2015 1048   BILITOT 0.5 01/22/2014 0955       RADIOGRAPHIC STUDIES: Ct Chest W Contrast  10/28/2015  CLINICAL DATA:  Patient with history of lung cancer diagnosed 11/2013. On chemotherapy. EXAM: CT CHEST, ABDOMEN, AND PELVIS WITH CONTRAST TECHNIQUE: Multidetector CT imaging of the chest, abdomen and pelvis was performed following the standard protocol during bolus administration of intravenous contrast. CONTRAST:  145m ISOVUE-300 IOPAMIDOL (ISOVUE-300) INJECTION 61% COMPARISON:  CT CAP 08/12/2015 FINDINGS: CT CHEST FINDINGS Mediastinum/Lymph Nodes: No axillary lymphadenopathy. Grossly unchanged mediastinal adenopathy including an 11 mm right peritracheal lymph node (image 25; series 2) and a 9 mm subcarinal lymph node (image 36; series 2). Stable heart size. Trace pericardial fluid. Coronary arterial vascular calcifications. Unchanged 10 mm right hilar lymph node (image 37; series 2). Unchanged right infrahilar lymph node measuring 11 mm (image 40; series 2). Lungs/Pleura: Re- demonstrated occlusion of the right middle lobe bronchus. There is associated right middle lobe atelectasis. Unchanged 8 mm peripheral nodule within the right middle lobe (image 90; series 4). Extensive centrilobular and paraseptal emphysematous change. Grossly unchanged bilateral 3 mm nodules. Additionally within the right upper lobe (image 109; series 4) there is a new 9 mm nodule. No pleural effusion or  pneumothorax. Musculoskeletal: Multiple new sclerotic lesions demonstrated throughout the thoracic spine including a 1.8 cm lesion within the T3 vertebral body, a 0.9 cm lesion within the T8 vertebral body and a 1.0 cm lesion within the T9 vertebral body. CT ABDOMEN PELVIS FINDINGS Hepatobiliary: Interval development of a 1.1 cm low-attenuation lesion within the central liver (image 61; series 2). Interval increase in size of low-attenuation lesion within the peripheral left hepatic lobe (image 59; series 2) measuring 1.1 x 1.6 cm, previously 1.2 x 0.9 cm. This interval development of a 1.0 cm lesion within the inferior right hepatic lobe (image 83; series 2). There is an additional adjacent new 1.1 cm lesion (image 80; series 2). Slight interval  increase in size of subcapsular lesion within the posterior right hepatic lobe measuring 1.0 x 0.5 cm (image 72; series 2), previous measuring 0.8 x 0.7 cm. Additional new 0.5 cm lesion near the hepatic dome (image 58; series 2). Gallbladder is unremarkable. Pancreas: Unremarkable Spleen: Unremarkable Adrenals/Urinary Tract: Interval increase in size of right adrenal mass measuring 1.6 x 2.5 cm (image 68; series 2), previously measuring 1.3 x 2.1 cm. Grossly unchanged left adrenal nodule measuring 1.1 x 0.9 cm (image 68; series 2), previously 1.1 x 0.9 cm. Kidneys enhance symmetrically with contrast. Unchanged too small to characterize low-attenuation lesions. No hydronephrosis. Urinary bladder is unremarkable. Stomach/Bowel: The appendix is normal. No abnormal bowel wall thickening or evidence for bowel obstruction. No free fluid or free intraperitoneal air. Vascular/Lymphatic: Normal caliber abdominal aorta. Peripheral calcified atherosclerotic plaque. No retroperitoneal lymphadenopathy. Other: Prostate is unremarkable. Musculoskeletal: Interval increase in size of S1 lytic lesion measuring 3.5 x 2.6 cm (image 100; series 2), previously measuring 2.5 x 1.7 cm. Re-  demonstrated L4-S1 spinal fusion. Interval development of a 2.5 cm sclerotic lesion within the L1 vertebral body. IMPRESSION: Interval development of new and enlargement of additional low-attenuation liver lesions compatible with metastatic disease. Interval enlargement of previously identified sacral lytic lesion. Multiple new osseous metastatic lesions are demonstrated. Grossly unchanged mediastinal and right hilar adenopathy. Interval development of a new 9 mm nodule within the right upper lobe which is nonspecific and may represent metastasis or a focal infectious/ inflammatory process. Additional smaller nodules within the lungs bilaterally are stable. Interval enlargement of right adrenal mass. Electronically Signed   By: Lovey Newcomer M.D.   On: 10/28/2015 09:36   Ct Abdomen Pelvis W Contrast  10/28/2015  CLINICAL DATA:  Patient with history of lung cancer diagnosed 11/2013. On chemotherapy. EXAM: CT CHEST, ABDOMEN, AND PELVIS WITH CONTRAST TECHNIQUE: Multidetector CT imaging of the chest, abdomen and pelvis was performed following the standard protocol during bolus administration of intravenous contrast. CONTRAST:  139m ISOVUE-300 IOPAMIDOL (ISOVUE-300) INJECTION 61% COMPARISON:  CT CAP 08/12/2015 FINDINGS: CT CHEST FINDINGS Mediastinum/Lymph Nodes: No axillary lymphadenopathy. Grossly unchanged mediastinal adenopathy including an 11 mm right peritracheal lymph node (image 25; series 2) and a 9 mm subcarinal lymph node (image 36; series 2). Stable heart size. Trace pericardial fluid. Coronary arterial vascular calcifications. Unchanged 10 mm right hilar lymph node (image 37; series 2). Unchanged right infrahilar lymph node measuring 11 mm (image 40; series 2). Lungs/Pleura: Re- demonstrated occlusion of the right middle lobe bronchus. There is associated right middle lobe atelectasis. Unchanged 8 mm peripheral nodule within the right middle lobe (image 90; series 4). Extensive centrilobular and paraseptal  emphysematous change. Grossly unchanged bilateral 3 mm nodules. Additionally within the right upper lobe (image 109; series 4) there is a new 9 mm nodule. No pleural effusion or pneumothorax. Musculoskeletal: Multiple new sclerotic lesions demonstrated throughout the thoracic spine including a 1.8 cm lesion within the T3 vertebral body, a 0.9 cm lesion within the T8 vertebral body and a 1.0 cm lesion within the T9 vertebral body. CT ABDOMEN PELVIS FINDINGS Hepatobiliary: Interval development of a 1.1 cm low-attenuation lesion within the central liver (image 61; series 2). Interval increase in size of low-attenuation lesion within the peripheral left hepatic lobe (image 59; series 2) measuring 1.1 x 1.6 cm, previously 1.2 x 0.9 cm. This interval development of a 1.0 cm lesion within the inferior right hepatic lobe (image 83; series 2). There is an additional adjacent new 1.1 cm lesion (image 80;  series 2). Slight interval increase in size of subcapsular lesion within the posterior right hepatic lobe measuring 1.0 x 0.5 cm (image 72; series 2), previous measuring 0.8 x 0.7 cm. Additional new 0.5 cm lesion near the hepatic dome (image 58; series 2). Gallbladder is unremarkable. Pancreas: Unremarkable Spleen: Unremarkable Adrenals/Urinary Tract: Interval increase in size of right adrenal mass measuring 1.6 x 2.5 cm (image 68; series 2), previously measuring 1.3 x 2.1 cm. Grossly unchanged left adrenal nodule measuring 1.1 x 0.9 cm (image 68; series 2), previously 1.1 x 0.9 cm. Kidneys enhance symmetrically with contrast. Unchanged too small to characterize low-attenuation lesions. No hydronephrosis. Urinary bladder is unremarkable. Stomach/Bowel: The appendix is normal. No abnormal bowel wall thickening or evidence for bowel obstruction. No free fluid or free intraperitoneal air. Vascular/Lymphatic: Normal caliber abdominal aorta. Peripheral calcified atherosclerotic plaque. No retroperitoneal lymphadenopathy. Other:  Prostate is unremarkable. Musculoskeletal: Interval increase in size of S1 lytic lesion measuring 3.5 x 2.6 cm (image 100; series 2), previously measuring 2.5 x 1.7 cm. Re- demonstrated L4-S1 spinal fusion. Interval development of a 2.5 cm sclerotic lesion within the L1 vertebral body. IMPRESSION: Interval development of new and enlargement of additional low-attenuation liver lesions compatible with metastatic disease. Interval enlargement of previously identified sacral lytic lesion. Multiple new osseous metastatic lesions are demonstrated. Grossly unchanged mediastinal and right hilar adenopathy. Interval development of a new 9 mm nodule within the right upper lobe which is nonspecific and may represent metastasis or a focal infectious/ inflammatory process. Additional smaller nodules within the lungs bilaterally are stable. Interval enlargement of right adrenal mass. Electronically Signed   By: Lovey Newcomer M.D.   On: 10/28/2015 09:36   ASSESSMENT AND PLAN:  This is a very pleasant 55 years old [place] male recently diagnosed with:  1) Extensive stage small cell lung cancer: He is status post 6 cycles of systemic chemotherapy with carboplatin and etoposide. This was followed by prophylactic cranial irradiation.  He has been on observation for the last 18 months.  Unfortunately the recent CT scan of the chest, abdomen and pelvis showed evidence for disease progression including right paratracheal lymphadenopathy in addition to bilateral adrenal gland masses and new small liver lesion as well as bone lesion. He was started on second course of treatment with chemotherapy was carboplatin and etoposide status post 2 cycle. He tolerated his treatment well with no significant adverse effects except for fatigue. Unfortunately the recent CT scan of the chest, abdomen and pelvis showed evidence for further disease progression in the liver and bone. I discussed the scan results with the patient today. I  recommended for him to discontinue his current treatment with carboplatin and etoposide. I discussed with the patient several options for treatment of his condition including second line treatment with cisplatin 30 MG/M2 on days irinotecan 65 MG/M2 on days 1 and 8 every 3 weeks. I also discussed with him the option of second opinion at [facility] as the patient is planning to move to [facility]. He would like to start the chemotherapy here as he is planning to move in July 2017. For the progressive bone lesion, I referred the patient to Dr. Sondra Come for consideration of palliative radiotherapy to the progressive lesion in the sacral area. He is expected to start the first dose of his systemic chemotherapy next week.  A copy of his CT scan of the chest, abdomen and pelvis as well as patient instruction and information about cisplatin and irinotecan was given to  the patient today.  2) right lower lobe pulmonary embolus: He will continue his Coumadin treatment under the care of his primary care physician.  3) For the weight loss, I recommended for the patient to continue Marinol.  He would come back for follow-up visit in 4 weeks with the start of cycle #2. He was advised to call immediately if he has any concerning symptoms in the interval. The patient voices understanding of current disease status and treatment options and is in agreement with the current care plan.  All questions were answered. The patient knows to call the clinic with any problems, questions or concerns. We can certainly see the patient much sooner if necessary.  Disclaimer: This note was dictated with voice recognition software. Similar sounding words can inadvertently be transcribed and may not be corrected upon review.

## 2015-10-29 NOTE — Patient Instructions (Signed)
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 The lung nodule and lymph nodes are unchanged. I recommended for you to discontinue the current treatment with carboplatin and etoposide. We discussed several options including secondary to [facility] in [place] if you're plan to move there sooner. We also discussed other treatment options including second line chemotherapy with reduced dose cisplatin and irinotecan giving 1 day week for 2 weeks out of every 3 weeks. We discussed the side effects of this treatment including increased risk of diarrhea, nausea and vomiting as well as fatigue and draped in the blood count. You are expected to start the first dose of this treatment next week. Follow-up visit in 4 weeks with the start of cycle #2.  Irinotecan injection What is this medicine? IRINOTECAN (ir in oh TEE kan ) is a chemotherapy drug. It is used to treat colon and rectal cancer. This medicine may be used for other purposes; ask your health care provider or pharmacist if you have questions. What should I tell my health care provider before I take this medicine? They need to know if you have any of these conditions: -blood disorders -dehydration -diarrhea -infection (especially a virus infection such as chickenpox, cold sores, or herpes) -liver disease -low blood counts, like low white cell, platelet, or red cell counts -recent or ongoing radiation therapy -an unusual or allergic reaction to irinotecan, sorbitol, other chemotherapy, other medicines, foods, dyes, or preservatives -pregnant or trying to get pregnant -breast-feeding How should I use this medicine? This drug is given as an infusion into a vein. It is administered in a hospital or clinic by a specially trained health care professional. Talk to your pediatrician regarding the use of this medicine in children. Special  care may be needed. Overdosage: If you think you have taken too much of this medicine contact a poison control center or emergency room at once. NOTE: This medicine is only for you. Do not share this medicine with others. What if I miss a dose? It is important not to miss your dose. Call your doctor or health care professional if you are unable to keep an appointment. What may interact with this medicine? Do not take this medicine with any of the following medications: -atazanavir -certain medicines for fungal infections like itraconazole and ketoconazole -St. John's Wort This medicine may also interact with the following medications: -dexamethasone -diuretics -laxatives -medicines for seizures like carbamazepine, mephobarbital, phenobarbital, phenytoin, primidone -medicines to increase blood counts like filgrastim, pegfilgrastim, sargramostim -prochlorperazine -vaccines This list may not describe all possible interactions. Give your health care provider a list of all the medicines, herbs, non-prescription drugs, or dietary supplements you use. Also tell them if you smoke, drink alcohol, or use illegal drugs. Some items may interact with your medicine. What should I watch for while using this medicine? Your condition will be monitored carefully while you are receiving this medicine. You will need important blood work done while you are taking this medicine. This drug may make you feel generally unwell. This is not uncommon, as chemotherapy can affect healthy cells as well as cancer cells. Report any side effects. Continue your course of treatment even though you feel ill unless your doctor tells you to stop. In some cases, you may be given additional medicines to help with side effects. Follow all directions for their use. You may get drowsy or dizzy. Do not drive,  use machinery, or do anything that needs mental alertness until you know how this medicine affects you. Do not stand or sit up  quickly, especially if you are an older patient. This reduces the risk of dizzy or fainting spells. Call your doctor or health care professional for advice if you get a fever, chills or sore throat, or other symptoms of a cold or flu. Do not treat yourself. This drug decreases your body's ability to fight infections. Try to avoid being around people who are sick. This medicine may increase your risk to bruise or bleed. Call your doctor or health care professional if you notice any unusual bleeding. Be careful brushing and flossing your teeth or using a toothpick because you may get an infection or bleed more easily. If you have any dental work done, tell your dentist you are receiving this medicine. Avoid taking products that contain aspirin, acetaminophen, ibuprofen, naproxen, or ketoprofen unless instructed by your doctor. These medicines may hide a fever. Do not become pregnant while taking this medicine. Women should inform their doctor if they wish to become pregnant or think they might be pregnant. There is a potential for serious side effects to an unborn child. Talk to your health care professional or pharmacist for more information. Do not breast-feed an infant while taking this medicine. What side effects may I notice from receiving this medicine? Side effects that you should report to your doctor or health care professional as soon as possible: -allergic reactions like skin rash, itching or hives, swelling of the face, lips, or tongue -low blood counts - this medicine may decrease the number of white blood cells, red blood cells and platelets. You may be at increased risk for infections and bleeding. -signs of infection - fever or chills, cough, sore throat, pain or difficulty passing urine -signs of decreased platelets or bleeding - bruising, pinpoint red spots on the skin, black, tarry stools, blood in the urine -signs of decreased red blood cells - unusually weak or tired, fainting spells,  lightheadedness -breathing problems -chest pain -diarrhea -feeling faint or lightheaded, falls -flushing, runny nose, sweating during infusion -mouth sores or pain -pain, swelling, redness or irritation where injected -pain, swelling, warmth in the leg -pain, tingling, numbness in the hands or feet -problems with balance, talking, walking -stomach cramps, pain -trouble passing urine or change in the amount of urine -vomiting as to be unable to hold down drinks or food -yellowing of the eyes or skin Side effects that usually do not require medical attention (report to your doctor or health care professional if they continue or are bothersome): -constipation -hair loss -headache -loss of appetite -nausea, vomiting -stomach upset This list may not describe all possible side effects. Call your doctor for medical advice about side effects. You may report side effects to FDA at 1-800-FDA-1088. Where should I keep my medicine? This drug is given in a hospital or clinic and will not be stored at home. NOTE: This sheet is a summary. It may not cover all possible information. If you have questions about this medicine, talk to your doctor, pharmacist, or health care provider.    2016, [employer]. (2012-11-28 16:29:32)  Cisplatin injection What is this medicine? CISPLATIN (SIS pla tin) is a chemotherapy drug. It targets fast dividing cells, like cancer cells, and causes these cells to die. This medicine is used to treat many types of cancer like bladder, ovarian, and testicular cancers. This medicine may be used for other purposes; ask your  health care provider or pharmacist if you have questions. What should I tell my health care provider before I take this medicine? They need to know if you have any of these conditions: -blood disorders -hearing problems -kidney disease -recent or ongoing radiation therapy -an unusual or allergic reaction to cisplatin, carboplatin, other  chemotherapy, other medicines, foods, dyes, or preservatives -pregnant or trying to get pregnant -breast-feeding How should I use this medicine? This drug is given as an infusion into a vein. It is administered in a hospital or clinic by a specially trained health care professional. Talk to your pediatrician regarding the use of this medicine in children. Special care may be needed. Overdosage: If you think you have taken too much of this medicine contact a poison control center or emergency room at once. NOTE: This medicine is only for you. Do not share this medicine with others. What if I miss a dose? It is important not to miss a dose. Call your doctor or health care professional if you are unable to keep an appointment. What may interact with this medicine? -dofetilide -foscarnet -medicines for seizures -medicines to increase blood counts like filgrastim, pegfilgrastim, sargramostim -probenecid -pyridoxine used with altretamine -rituximab -some antibiotics like amikacin, gentamicin, neomycin, polymyxin B, streptomycin, tobramycin -sulfinpyrazone -vaccines -zalcitabine Talk to your doctor or health care professional before taking any of these medicines: -acetaminophen -aspirin -ibuprofen -ketoprofen -naproxen This list may not describe all possible interactions. Give your health care provider a list of all the medicines, herbs, non-prescription drugs, or dietary supplements you use. Also tell them if you smoke, drink alcohol, or use illegal drugs. Some items may interact with your medicine. What should I watch for while using this medicine? Your condition will be monitored carefully while you are receiving this medicine. You will need important blood work done while you are taking this medicine. This drug may make you feel generally unwell. This is not uncommon, as chemotherapy can affect healthy cells as well as cancer cells. Report any side effects. Continue your course of  treatment even though you feel ill unless your doctor tells you to stop. In some cases, you may be given additional medicines to help with side effects. Follow all directions for their use. Call your doctor or health care professional for advice if you get a fever, chills or sore throat, or other symptoms of a cold or flu. Do not treat yourself. This drug decreases your body's ability to fight infections. Try to avoid being around people who are sick. This medicine may increase your risk to bruise or bleed. Call your doctor or health care professional if you notice any unusual bleeding. Be careful brushing and flossing your teeth or using a toothpick because you may get an infection or bleed more easily. If you have any dental work done, tell your dentist you are receiving this medicine. Avoid taking products that contain aspirin, acetaminophen, ibuprofen, naproxen, or ketoprofen unless instructed by your doctor. These medicines may hide a fever. Do not become pregnant while taking this medicine. Women should inform their doctor if they wish to become pregnant or think they might be pregnant. There is a potential for serious side effects to an unborn child. Talk to your health care professional or pharmacist for more information. Do not breast-feed an infant while taking this medicine. Drink fluids as directed while you are taking this medicine. This will help protect your kidneys. Call your doctor or health care professional if you get diarrhea. Do not treat  yourself. What side effects may I notice from receiving this medicine? Side effects that you should report to your doctor or health care professional as soon as possible: -allergic reactions like skin rash, itching or hives, swelling of the face, lips, or tongue -signs of infection - fever or chills, cough, sore throat, pain or difficulty passing urine -signs of decreased platelets or bleeding - bruising, pinpoint red spots on the skin, black,  tarry stools, nosebleeds -signs of decreased red blood cells - unusually weak or tired, fainting spells, lightheadedness -breathing problems -changes in hearing -gout pain -low blood counts - This drug may decrease the number of white blood cells, red blood cells and platelets. You may be at increased risk for infections and bleeding. -nausea and vomiting -pain, swelling, redness or irritation at the injection site -pain, tingling, numbness in the hands or feet -problems with balance, movement -trouble passing urine or change in the amount of urine Side effects that usually do not require medical attention (report to your doctor or health care professional if they continue or are bothersome): -changes in vision -loss of appetite -metallic taste in the mouth or changes in taste This list may not describe all possible side effects. Call your doctor for medical advice about side effects. You may report side effects to FDA at 1-800-FDA-1088. Where should I keep my medicine? This drug is given in a hospital or clinic and will not be stored at home. NOTE: This sheet is a summary. It may not cover all possible information. If you have questions about this medicine, talk to your doctor, pharmacist, or health care provider.    2016, [employer]. (2007-09-06 14:40:54)

## 2015-10-29 NOTE — Telephone Encounter (Signed)
Per staff message and POF I have scheduled appts. Advised scheduler of appts. JMW  

## 2015-10-29 NOTE — Patient Instructions (Signed)

## 2015-10-30 ENCOUNTER — Ambulatory Visit: Payer: BLUE CROSS/BLUE SHIELD

## 2015-10-31 ENCOUNTER — Ambulatory Visit: Payer: BLUE CROSS/BLUE SHIELD

## 2015-11-02 ENCOUNTER — Ambulatory Visit: Payer: BLUE CROSS/BLUE SHIELD

## 2015-11-05 ENCOUNTER — Other Ambulatory Visit: Payer: Self-pay | Admitting: Medical Oncology

## 2015-11-05 DIAGNOSIS — I2699 Other pulmonary embolism without acute cor pulmonale: Secondary | ICD-10-CM

## 2015-11-05 MED ORDER — WARFARIN SODIUM 5 MG PO TABS: 5.0000 mg | ORAL_TABLET | ORAL | Status: DC

## 2015-11-07 ENCOUNTER — Other Ambulatory Visit (HOSPITAL_BASED_OUTPATIENT_CLINIC_OR_DEPARTMENT_OTHER): Payer: BLUE CROSS/BLUE SHIELD

## 2015-11-07 ENCOUNTER — Ambulatory Visit: Payer: BLUE CROSS/BLUE SHIELD

## 2015-11-07 ENCOUNTER — Ambulatory Visit (HOSPITAL_BASED_OUTPATIENT_CLINIC_OR_DEPARTMENT_OTHER): Payer: BLUE CROSS/BLUE SHIELD

## 2015-11-07 VITALS — BP 152/92 | HR 96 | Temp 99.5°F | Resp 18

## 2015-11-07 DIAGNOSIS — C3491 Malignant neoplasm of unspecified part of right bronchus or lung: Secondary | ICD-10-CM

## 2015-11-07 DIAGNOSIS — C3490 Malignant neoplasm of unspecified part of unspecified bronchus or lung: Secondary | ICD-10-CM

## 2015-11-07 DIAGNOSIS — C3492 Malignant neoplasm of unspecified part of left bronchus or lung: Secondary | ICD-10-CM | POA: Diagnosis not present

## 2015-11-07 DIAGNOSIS — Z5111 Encounter for antineoplastic chemotherapy: Secondary | ICD-10-CM | POA: Diagnosis not present

## 2015-11-07 DIAGNOSIS — Z95828 Presence of other vascular implants and grafts: Secondary | ICD-10-CM

## 2015-11-07 LAB — CBC WITH DIFFERENTIAL/PLATELET
BASO%: 0.4 % (ref 0.0–2.0)
BASOS ABS: 0 10*3/uL (ref 0.0–0.1)
EOS%: 1.2 % (ref 0.0–7.0)
Eosinophils Absolute: 0.1 10*3/uL (ref 0.0–0.5)
HEMATOCRIT: 37.6 % — AB (ref 38.4–49.9)
HEMOGLOBIN: 12.9 g/dL — AB (ref 13.0–17.1)
LYMPH#: 1.5 10*3/uL (ref 0.9–3.3)
LYMPH%: 18.6 % (ref 14.0–49.0)
MCH: 33.2 pg (ref 27.2–33.4)
MCHC: 34.3 g/dL (ref 32.0–36.0)
MCV: 96.7 fL (ref 79.3–98.0)
MONO#: 0.8 10*3/uL (ref 0.1–0.9)
MONO%: 9.4 % (ref 0.0–14.0)
NEUT#: 5.8 10*3/uL (ref 1.5–6.5)
NEUT%: 70.4 % (ref 39.0–75.0)
PLATELETS: 276 10*3/uL (ref 140–400)
RBC: 3.89 10*6/uL — ABNORMAL LOW (ref 4.20–5.82)
RDW: 16.7 % — ABNORMAL HIGH (ref 11.0–14.6)
WBC: 8.3 10*3/uL (ref 4.0–10.3)

## 2015-11-07 LAB — COMPREHENSIVE METABOLIC PANEL
ALBUMIN: 3.3 g/dL — AB (ref 3.5–5.0)
ALK PHOS: 132 U/L (ref 40–150)
ALT: 17 U/L (ref 0–55)
ANION GAP: 10 meq/L (ref 3–11)
AST: 38 U/L — ABNORMAL HIGH (ref 5–34)
BILIRUBIN TOTAL: 0.38 mg/dL (ref 0.20–1.20)
BUN: 4.5 mg/dL — ABNORMAL LOW (ref 7.0–26.0)
CO2: 24 mEq/L (ref 22–29)
CREATININE: 0.8 mg/dL (ref 0.7–1.3)
Calcium: 9.1 mg/dL (ref 8.4–10.4)
Chloride: 105 mEq/L (ref 98–109)
Glucose: 179 mg/dl — ABNORMAL HIGH (ref 70–140)
Potassium: 3.8 mEq/L (ref 3.5–5.1)
Sodium: 139 mEq/L (ref 136–145)
TOTAL PROTEIN: 6.8 g/dL (ref 6.4–8.3)

## 2015-11-07 MED ORDER — SODIUM CHLORIDE 0.9% FLUSH
10.0000 mL | INTRAVENOUS | Status: DC | PRN
  Administered 2015-11-07: 10 mL
  Filled 2015-11-07: qty 10

## 2015-11-07 MED ORDER — SODIUM CHLORIDE 0.9 % IJ SOLN
10.0000 mL | INTRAMUSCULAR | Status: DC | PRN
  Administered 2015-11-07: 10 mL via INTRAVENOUS
  Filled 2015-11-07: qty 10

## 2015-11-07 MED ORDER — PALONOSETRON HCL INJECTION 0.25 MG/5ML
INTRAVENOUS | Status: AC
  Filled 2015-11-07: qty 5

## 2015-11-07 MED ORDER — PALONOSETRON HCL INJECTION 0.25 MG/5ML
0.2500 mg | Freq: Once | INTRAVENOUS | Status: AC
  Administered 2015-11-07: 0.25 mg via INTRAVENOUS

## 2015-11-07 MED ORDER — SODIUM CHLORIDE 0.9 % IV SOLN
30.0000 mg/m2 | Freq: Once | INTRAVENOUS | Status: AC
  Administered 2015-11-07: 55 mg via INTRAVENOUS
  Filled 2015-11-07: qty 55

## 2015-11-07 MED ORDER — SODIUM CHLORIDE 0.9 % IV SOLN
Freq: Once | INTRAVENOUS | Status: AC
  Administered 2015-11-07: 09:00:00 via INTRAVENOUS

## 2015-11-07 MED ORDER — HEPARIN SOD (PORK) LOCK FLUSH 100 UNIT/ML IV SOLN
500.0000 [IU] | Freq: Once | INTRAVENOUS | Status: AC | PRN
  Administered 2015-11-07: 500 [IU]
  Filled 2015-11-07: qty 5

## 2015-11-07 MED ORDER — ATROPINE SULFATE 1 MG/ML IJ SOLN
INTRAMUSCULAR | Status: AC
  Filled 2015-11-07: qty 1

## 2015-11-07 MED ORDER — ATROPINE SULFATE 1 MG/ML IJ SOLN
0.5000 mg | Freq: Once | INTRAMUSCULAR | Status: AC | PRN
Start: 2015-11-07 — End: 2015-11-07
  Administered 2015-11-07: 0.5 mg via INTRAVENOUS

## 2015-11-07 MED ORDER — POTASSIUM CHLORIDE 2 MEQ/ML IV SOLN
Freq: Once | INTRAVENOUS | Status: AC
  Administered 2015-11-07: 09:00:00 via INTRAVENOUS
  Filled 2015-11-07: qty 10

## 2015-11-07 MED ORDER — IRINOTECAN HCL CHEMO INJECTION 100 MG/5ML
65.5000 mg/m2 | Freq: Once | INTRAVENOUS | Status: AC
  Administered 2015-11-07: 120 mg via INTRAVENOUS
  Filled 2015-11-07: qty 1

## 2015-11-07 MED ORDER — SODIUM CHLORIDE 0.9 % IV SOLN
Freq: Once | INTRAVENOUS | Status: AC
  Administered 2015-11-07: 11:00:00 via INTRAVENOUS
  Filled 2015-11-07: qty 5

## 2015-11-07 NOTE — Addendum Note (Signed)
Addended by: Cheree Ditto on: 11/07/2015 04:17 PM   Modules accepted: Orders, Medications

## 2015-11-07 NOTE — Progress Notes (Signed)
1005: Pt voided 357m.    1135: Pt given atropine despite first Irinotecan infusion.  Pt states he is currently having diarrhea.

## 2015-11-07 NOTE — Patient Instructions (Addendum)
[facility] Discharge Instructions for Patients Receiving Chemotherapy  Today you received the following chemotherapy agents Irinotecan/Cisplatin.  To help prevent nausea and vomiting after your treatment, we encourage you to take your nausea medication as directed.   If you develop nausea and vomiting that is not controlled by your nausea medication, call the clinic.   BELOW ARE SYMPTOMS THAT SHOULD BE REPORTED IMMEDIATELY:  *FEVER GREATER THAN 100.5 F  *CHILLS WITH OR WITHOUT FEVER  NAUSEA AND VOMITING THAT IS NOT CONTROLLED WITH YOUR NAUSEA MEDICATION  *UNUSUAL SHORTNESS OF BREATH  *UNUSUAL BRUISING OR BLEEDING  TENDERNESS IN MOUTH AND THROAT WITH OR WITHOUT PRESENCE OF ULCERS  *URINARY PROBLEMS  *BOWEL PROBLEMS  UNUSUAL RASH Items with * indicate a potential emergency and should be followed up as soon as possible.  Feel free to call the clinic you have any questions or concerns. The clinic phone number is [phone].  Please show the [facility] at check-in to the Emergency Department and triage nurse.  Irinotecan injection What is this medicine? IRINOTECAN (ir in oh TEE kan ) is a chemotherapy drug. It is used to treat colon and rectal cancer. This medicine may be used for other purposes; ask your health care provider or pharmacist if you have questions. What should I tell my health care provider before I take this medicine? They need to know if you have any of these conditions: -blood disorders -dehydration -diarrhea -infection (especially a virus infection such as chickenpox, cold sores, or herpes) -liver disease -low blood counts, like low white cell, platelet, or red cell counts -recent or ongoing radiation therapy -an unusual or allergic reaction to irinotecan, sorbitol, other chemotherapy, other medicines, foods, dyes, or preservatives -pregnant or trying to get pregnant -breast-feeding How should I use this medicine? This drug is  given as an infusion into a vein. It is administered in a hospital or clinic by a specially trained health care professional. Talk to your pediatrician regarding the use of this medicine in children. Special care may be needed. Overdosage: If you think you have taken too much of this medicine contact a poison control center or emergency room at once. NOTE: This medicine is only for you. Do not share this medicine with others. What if I miss a dose? It is important not to miss your dose. Call your doctor or health care professional if you are unable to keep an appointment. What may interact with this medicine? Do not take this medicine with any of the following medications: -atazanavir -certain medicines for fungal infections like itraconazole and ketoconazole -St. John's Wort This medicine may also interact with the following medications: -dexamethasone -diuretics -laxatives -medicines for seizures like carbamazepine, mephobarbital, phenobarbital, phenytoin, primidone -medicines to increase blood counts like filgrastim, pegfilgrastim, sargramostim -prochlorperazine -vaccines This list may not describe all possible interactions. Give your health care provider a list of all the medicines, herbs, non-prescription drugs, or dietary supplements you use. Also tell them if you smoke, drink alcohol, or use illegal drugs. Some items may interact with your medicine. What should I watch for while using this medicine? Your condition will be monitored carefully while you are receiving this medicine. You will need important blood work done while you are taking this medicine. This drug may make you feel generally unwell. This is not uncommon, as chemotherapy can affect healthy cells as well as cancer cells. Report any side effects. Continue your course of treatment even though you feel ill unless your doctor tells  you to stop. In some cases, you may be given additional medicines to help with side effects.  Follow all directions for their use. You may get drowsy or dizzy. Do not drive, use machinery, or do anything that needs mental alertness until you know how this medicine affects you. Do not stand or sit up quickly, especially if you are an older patient. This reduces the risk of dizzy or fainting spells. Call your doctor or health care professional for advice if you get a fever, chills or sore throat, or other symptoms of a cold or flu. Do not treat yourself. This drug decreases your body's ability to fight infections. Try to avoid being around people who are sick. This medicine may increase your risk to bruise or bleed. Call your doctor or health care professional if you notice any unusual bleeding. Be careful brushing and flossing your teeth or using a toothpick because you may get an infection or bleed more easily. If you have any dental work done, tell your dentist you are receiving this medicine. Avoid taking products that contain aspirin, acetaminophen, ibuprofen, naproxen, or ketoprofen unless instructed by your doctor. These medicines may hide a fever. Do not become pregnant while taking this medicine. Women should inform their doctor if they wish to become pregnant or think they might be pregnant. There is a potential for serious side effects to an unborn child. Talk to your health care professional or pharmacist for more information. Do not breast-feed an infant while taking this medicine. What side effects may I notice from receiving this medicine? Side effects that you should report to your doctor or health care professional as soon as possible: -allergic reactions like skin rash, itching or hives, swelling of the face, lips, or tongue -low blood counts - this medicine may decrease the number of white blood cells, red blood cells and platelets. You may be at increased risk for infections and bleeding. -signs of infection - fever or chills, cough, sore throat, pain or difficulty passing  urine -signs of decreased platelets or bleeding - bruising, pinpoint red spots on the skin, black, tarry stools, blood in the urine -signs of decreased red blood cells - unusually weak or tired, fainting spells, lightheadedness -breathing problems -chest pain -diarrhea -feeling faint or lightheaded, falls -flushing, runny nose, sweating during infusion -mouth sores or pain -pain, swelling, redness or irritation where injected -pain, swelling, warmth in the leg -pain, tingling, numbness in the hands or feet -problems with balance, talking, walking -stomach cramps, pain -trouble passing urine or change in the amount of urine -vomiting as to be unable to hold down drinks or food -yellowing of the eyes or skin Side effects that usually do not require medical attention (report to your doctor or health care professional if they continue or are bothersome): -constipation -hair loss -headache -loss of appetite -nausea, vomiting -stomach upset This list may not describe all possible side effects. Call your doctor for medical advice about side effects. You may report side effects to FDA at 1-800-FDA-1088. Where should I keep my medicine? This drug is given in a hospital or clinic and will not be stored at home. NOTE: This sheet is a summary. It may not cover all possible information. If you have questions about this medicine, talk to your doctor, pharmacist, or health care provider.    2016, Elsevier/Gold Standard. (2012-11-28 16:29:32)   Cisplatin injection What is this medicine? CISPLATIN (SIS pla tin) is a chemotherapy drug. It targets fast dividing cells, like cancer  cells, and causes these cells to die. This medicine is used to treat many types of cancer like bladder, ovarian, and testicular cancers. This medicine may be used for other purposes; ask your health care provider or pharmacist if you have questions. What should I tell my health care provider before I take this  medicine? They need to know if you have any of these conditions: -blood disorders -hearing problems -kidney disease -recent or ongoing radiation therapy -an unusual or allergic reaction to cisplatin, carboplatin, other chemotherapy, other medicines, foods, dyes, or preservatives -pregnant or trying to get pregnant -breast-feeding How should I use this medicine? This drug is given as an infusion into a vein. It is administered in a hospital or clinic by a specially trained health care professional. Talk to your pediatrician regarding the use of this medicine in children. Special care may be needed. Overdosage: If you think you have taken too much of this medicine contact a poison control center or emergency room at once. NOTE: This medicine is only for you. Do not share this medicine with others. What if I miss a dose? It is important not to miss a dose. Call your doctor or health care professional if you are unable to keep an appointment. What may interact with this medicine? -dofetilide -foscarnet -medicines for seizures -medicines to increase blood counts like filgrastim, pegfilgrastim, sargramostim -probenecid -pyridoxine used with altretamine -rituximab -some antibiotics like amikacin, gentamicin, neomycin, polymyxin B, streptomycin, tobramycin -sulfinpyrazone -vaccines -zalcitabine Talk to your doctor or health care professional before taking any of these medicines: -acetaminophen -aspirin -ibuprofen -ketoprofen -naproxen This list may not describe all possible interactions. Give your health care provider a list of all the medicines, herbs, non-prescription drugs, or dietary supplements you use. Also tell them if you smoke, drink alcohol, or use illegal drugs. Some items may interact with your medicine. What should I watch for while using this medicine? Your condition will be monitored carefully while you are receiving this medicine. You will need important blood work done  while you are taking this medicine. This drug may make you feel generally unwell. This is not uncommon, as chemotherapy can affect healthy cells as well as cancer cells. Report any side effects. Continue your course of treatment even though you feel ill unless your doctor tells you to stop. In some cases, you may be given additional medicines to help with side effects. Follow all directions for their use. Call your doctor or health care professional for advice if you get a fever, chills or sore throat, or other symptoms of a cold or flu. Do not treat yourself. This drug decreases your body's ability to fight infections. Try to avoid being around people who are sick. This medicine may increase your risk to bruise or bleed. Call your doctor or health care professional if you notice any unusual bleeding. Be careful brushing and flossing your teeth or using a toothpick because you may get an infection or bleed more easily. If you have any dental work done, tell your dentist you are receiving this medicine. Avoid taking products that contain aspirin, acetaminophen, ibuprofen, naproxen, or ketoprofen unless instructed by your doctor. These medicines may hide a fever. Do not become pregnant while taking this medicine. Women should inform their doctor if they wish to become pregnant or think they might be pregnant. There is a potential for serious side effects to an unborn child. Talk to your health care professional or pharmacist for more information. Do not breast-feed an infant while  taking this medicine. Drink fluids as directed while you are taking this medicine. This will help protect your kidneys. Call your doctor or health care professional if you get diarrhea. Do not treat yourself. What side effects may I notice from receiving this medicine? Side effects that you should report to your doctor or health care professional as soon as possible: -allergic reactions like skin rash, itching or hives, swelling  of the face, lips, or tongue -signs of infection - fever or chills, cough, sore throat, pain or difficulty passing urine -signs of decreased platelets or bleeding - bruising, pinpoint red spots on the skin, black, tarry stools, nosebleeds -signs of decreased red blood cells - unusually weak or tired, fainting spells, lightheadedness -breathing problems -changes in hearing -gout pain -low blood counts - This drug may decrease the number of white blood cells, red blood cells and platelets. You may be at increased risk for infections and bleeding. -nausea and vomiting -pain, swelling, redness or irritation at the injection site -pain, tingling, numbness in the hands or feet -problems with balance, movement -trouble passing urine or change in the amount of urine Side effects that usually do not require medical attention (report to your doctor or health care professional if they continue or are bothersome): -changes in vision -loss of appetite -metallic taste in the mouth or changes in taste This list may not describe all possible side effects. Call your doctor for medical advice about side effects. You may report side effects to FDA at 1-800-FDA-1088. Where should I keep my medicine? This drug is given in a hospital or clinic and will not be stored at home. NOTE: This sheet is a summary. It may not cover all possible information. If you have questions about this medicine, talk to your doctor, pharmacist, or health care provider.    2016, Elsevier/Gold Standard. (2007-09-06 14:40:54)

## 2015-11-07 NOTE — Patient Instructions (Signed)

## 2015-11-08 ENCOUNTER — Telehealth: Payer: Self-pay | Admitting: Medical Oncology

## 2015-11-08 NOTE — Telephone Encounter (Signed)
States he is doing well after chemo. No problems voiced.

## 2015-11-12 NOTE — Progress Notes (Signed)
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 " Multiple new sclerotic lesions demonstrated throughout the thoracic spine including a 1.8 cm lesion within theT3 vertebral body, a 0.9 cm lesion within the T8 vertebral body and a 1.0 cm lesion within the T9 vertebral body.  Past/Anticipated chemotherapy by medical oncology, if any: systemic chemotherapy with cisplatin 70 MG/M2 and irinotecan 65 MG/M2 on days 1 and 8 every 3 weeks. First dose 11/07/2015. 2) initial treatment with Xarelto 20 mg by mouth daily. First dose of treatment on 05/03/2014. This was switched on 10/01/2014 to Coumadin and followed by primary care physician.   Pain on a scale of 0-10 is: intermittent abdominal cramping at a level 9 while waiting in [place] waiting, but none at this time  If Spine Met(s), symptoms, if any, include:  Bowel/Bladder retention or incontinence (please describe):diarrhea, loose stools/soft daily x 3 days.   Numbness or weakness in extremities (please describe):  Numbness of fingers -   Current Decadron regimen, if applicable:None  Ambulatory status? Walker? Wheelchair?: Ambulatory  SAFETY ISSUES:  Prior radiation? Yes - 06/05/14-06/20/14 whole brain 25 gray  Pacemaker/ICD? no  Possible current pregnancy? no  Is the patient on methotrexate? no  Current Complaints / other details:    Chemotherapy continues

## 2015-11-14 ENCOUNTER — Emergency Department (HOSPITAL_COMMUNITY): Payer: BLUE CROSS/BLUE SHIELD

## 2015-11-14 ENCOUNTER — Ambulatory Visit: Payer: BLUE CROSS/BLUE SHIELD

## 2015-11-14 ENCOUNTER — Encounter (HOSPITAL_COMMUNITY): Payer: Self-pay | Admitting: Emergency Medicine

## 2015-11-14 ENCOUNTER — Other Ambulatory Visit: Payer: BLUE CROSS/BLUE SHIELD

## 2015-11-14 ENCOUNTER — Inpatient Hospital Stay (HOSPITAL_COMMUNITY)
Admission: EM | Admit: 2015-11-14 | Discharge: 2015-11-15 | DRG: 190 | Disposition: A | Discharge status: Home / Self Care | Payer: BLUE CROSS/BLUE SHIELD | Attending: Internal Medicine | Admitting: Internal Medicine

## 2015-11-14 ENCOUNTER — Inpatient Hospital Stay (HOSPITAL_COMMUNITY): Payer: BLUE CROSS/BLUE SHIELD

## 2015-11-14 DIAGNOSIS — C7971 Secondary malignant neoplasm of right adrenal gland: Secondary | ICD-10-CM | POA: Diagnosis present

## 2015-11-14 DIAGNOSIS — C3490 Malignant neoplasm of unspecified part of unspecified bronchus or lung: Secondary | ICD-10-CM | POA: Diagnosis present

## 2015-11-14 DIAGNOSIS — Z823 Family history of stroke: Secondary | ICD-10-CM | POA: Diagnosis not present

## 2015-11-14 DIAGNOSIS — Z808 Family history of malignant neoplasm of other organs or systems: Secondary | ICD-10-CM | POA: Diagnosis not present

## 2015-11-14 DIAGNOSIS — C3492 Malignant neoplasm of unspecified part of left bronchus or lung: Secondary | ICD-10-CM | POA: Diagnosis not present

## 2015-11-14 DIAGNOSIS — J189 Pneumonia, unspecified organism: Secondary | ICD-10-CM | POA: Diagnosis present

## 2015-11-14 DIAGNOSIS — C7972 Secondary malignant neoplasm of left adrenal gland: Secondary | ICD-10-CM | POA: Diagnosis present

## 2015-11-14 DIAGNOSIS — C7951 Secondary malignant neoplasm of bone: Secondary | ICD-10-CM | POA: Diagnosis present

## 2015-11-14 DIAGNOSIS — Z981 Arthrodesis status: Secondary | ICD-10-CM

## 2015-11-14 DIAGNOSIS — B20 Human immunodeficiency virus [HIV] disease: Secondary | ICD-10-CM | POA: Diagnosis present

## 2015-11-14 DIAGNOSIS — Z7901 Long term (current) use of anticoagulants: Secondary | ICD-10-CM

## 2015-11-14 DIAGNOSIS — E739 Lactose intolerance, unspecified: Secondary | ICD-10-CM | POA: Diagnosis present

## 2015-11-14 DIAGNOSIS — Z8249 Family history of ischemic heart disease and other diseases of the circulatory system: Secondary | ICD-10-CM

## 2015-11-14 DIAGNOSIS — C787 Secondary malignant neoplasm of liver and intrahepatic bile duct: Secondary | ICD-10-CM | POA: Diagnosis present

## 2015-11-14 DIAGNOSIS — R51 Headache: Secondary | ICD-10-CM

## 2015-11-14 DIAGNOSIS — J440 Chronic obstructive pulmonary disease with acute lower respiratory infection: Secondary | ICD-10-CM | POA: Diagnosis present

## 2015-11-14 DIAGNOSIS — R519 Headache, unspecified: Secondary | ICD-10-CM

## 2015-11-14 DIAGNOSIS — R000 Tachycardia, unspecified: Secondary | ICD-10-CM | POA: Diagnosis present

## 2015-11-14 DIAGNOSIS — Z79899 Other long term (current) drug therapy: Secondary | ICD-10-CM

## 2015-11-14 DIAGNOSIS — J181 Lobar pneumonia, unspecified organism: Secondary | ICD-10-CM

## 2015-11-14 DIAGNOSIS — E785 Hyperlipidemia, unspecified: Secondary | ICD-10-CM | POA: Diagnosis present

## 2015-11-14 DIAGNOSIS — Z86711 Personal history of pulmonary embolism: Secondary | ICD-10-CM

## 2015-11-14 LAB — I-STAT CG4 LACTIC ACID, ED
LACTIC ACID, VENOUS: 1.25 mmol/L (ref 0.5–2.0)
Lactic Acid, Venous: 1.45 mmol/L (ref 0.5–2.0)

## 2015-11-14 LAB — CBC WITH DIFFERENTIAL/PLATELET
BASOS ABS: 0 10*3/uL (ref 0.0–0.1)
Basophils Relative: 0 %
Eosinophils Absolute: 0 10*3/uL (ref 0.0–0.7)
Eosinophils Relative: 0 %
HEMATOCRIT: 30.3 % — AB (ref 39.0–52.0)
Hemoglobin: 10.6 g/dL — ABNORMAL LOW (ref 13.0–17.0)
LYMPHS ABS: 1.7 10*3/uL (ref 0.7–4.0)
LYMPHS PCT: 18 %
MCH: 33 pg (ref 26.0–34.0)
MCHC: 35 g/dL (ref 30.0–36.0)
MCV: 94.4 fL (ref 78.0–100.0)
MONO ABS: 0.6 10*3/uL (ref 0.1–1.0)
Monocytes Relative: 6 %
NEUTROS ABS: 7 10*3/uL (ref 1.7–7.7)
Neutrophils Relative %: 76 %
Platelets: 284 10*3/uL (ref 150–400)
RBC: 3.21 MIL/uL — AB (ref 4.22–5.81)
RDW: 15.6 % — ABNORMAL HIGH (ref 11.5–15.5)
WBC: 9.4 10*3/uL (ref 4.0–10.5)

## 2015-11-14 LAB — URINALYSIS, ROUTINE W REFLEX MICROSCOPIC
Bilirubin Urine: NEGATIVE
GLUCOSE, UA: NEGATIVE mg/dL
HGB URINE DIPSTICK: NEGATIVE
Ketones, ur: NEGATIVE mg/dL
Leukocytes, UA: NEGATIVE
Nitrite: NEGATIVE
PH: 8 (ref 5.0–8.0)
Protein, ur: NEGATIVE mg/dL
SPECIFIC GRAVITY, URINE: 1.005 (ref 1.005–1.030)

## 2015-11-14 LAB — I-STAT CHEM 8, ED
BUN: <3 mg/dL — ABNORMAL LOW (ref 6–20)
CHLORIDE: 96 mmol/L — AB (ref 101–111)
CREATININE: 0.6 mg/dL — AB (ref 0.61–1.24)
Calcium, Ion: 1.13 mmol/L (ref 1.12–1.23)
GLUCOSE: 106 mg/dL — AB (ref 65–99)
HCT: 32 % — ABNORMAL LOW (ref 39.0–52.0)
Hemoglobin: 10.9 g/dL — ABNORMAL LOW (ref 13.0–17.0)
POTASSIUM: 4.1 mmol/L (ref 3.5–5.1)
Sodium: 134 mmol/L — ABNORMAL LOW (ref 135–145)
TCO2: 25 mmol/L (ref 0–100)

## 2015-11-14 LAB — STREP PNEUMONIAE URINARY ANTIGEN: Strep Pneumo Urinary Antigen: NEGATIVE

## 2015-11-14 LAB — EXPECTORATED SPUTUM ASSESSMENT W GRAM STAIN, RFLX TO RESP C

## 2015-11-14 LAB — PROTIME-INR
INR: 1.28 (ref 0.00–1.49)
Prothrombin Time: 15.7 seconds — ABNORMAL HIGH (ref 11.6–15.2)

## 2015-11-14 LAB — EXPECTORATED SPUTUM ASSESSMENT W REFEX TO RESP CULTURE

## 2015-11-14 LAB — MRSA PCR SCREENING: MRSA by PCR: NEGATIVE

## 2015-11-14 MED ORDER — ALBUTEROL SULFATE (2.5 MG/3ML) 0.083% IN NEBU
3.0000 mL | INHALATION_SOLUTION | RESPIRATORY_TRACT | Status: DC | PRN
Start: 2015-11-14 — End: 2015-11-15

## 2015-11-14 MED ORDER — ADULT MULTIVITAMIN W/MINERALS CH
1.0000 | ORAL_TABLET | Freq: Every day | ORAL | Status: DC
  Administered 2015-11-14 – 2015-11-15 (×2): 1 via ORAL
  Filled 2015-11-14 (×2): qty 1

## 2015-11-14 MED ORDER — PROCHLORPERAZINE MALEATE 10 MG PO TABS: 10.0000 mg | ORAL_TABLET | Freq: Four times a day (QID) | ORAL | Status: DC | PRN

## 2015-11-14 MED ORDER — CLONAZEPAM 0.5 MG PO TABS
0.2500 mg | ORAL_TABLET | Freq: Once | ORAL | Status: AC
  Administered 2015-11-14: 0.25 mg via ORAL
  Filled 2015-11-14: qty 1

## 2015-11-14 MED ORDER — DEXTROSE 5 % IV SOLN: 500.0000 mg | INTRAVENOUS | Status: DC

## 2015-11-14 MED ORDER — PIPERACILLIN-TAZOBACTAM 3.375 G IVPB 30 MIN: 3.3750 g | Freq: Once | INTRAVENOUS | Status: DC

## 2015-11-14 MED ORDER — TRAZODONE HCL 100 MG PO TABS
100.0000 mg | ORAL_TABLET | Freq: Every day | ORAL | Status: DC
  Administered 2015-11-14: 100 mg via ORAL
  Filled 2015-11-14 (×3): qty 1

## 2015-11-14 MED ORDER — FESOTERODINE FUMARATE ER 8 MG PO TB24
8.0000 mg | ORAL_TABLET | Freq: Every day | ORAL | Status: DC
  Administered 2015-11-14 – 2015-11-15 (×2): 8 mg via ORAL
  Filled 2015-11-14 (×2): qty 1

## 2015-11-14 MED ORDER — WARFARIN SODIUM 5 MG PO TABS
5.0000 mg | ORAL_TABLET | Freq: Once | ORAL | Status: AC
  Administered 2015-11-14: 5 mg via ORAL
  Filled 2015-11-14 (×2): qty 1

## 2015-11-14 MED ORDER — DOXYLAMINE SUCCINATE (SLEEP) 25 MG PO TABS
50.0000 mg | ORAL_TABLET | Freq: Every evening | ORAL | Status: DC | PRN
  Administered 2015-11-14: 50 mg via ORAL
  Filled 2015-11-14 (×3): qty 2

## 2015-11-14 MED ORDER — DRONABINOL 5 MG PO CAPS
10.0000 mg | ORAL_CAPSULE | Freq: Two times a day (BID) | ORAL | Status: DC
  Administered 2015-11-14 – 2015-11-15 (×4): 10 mg via ORAL
  Filled 2015-11-14 (×4): qty 2

## 2015-11-14 MED ORDER — ACETAMINOPHEN 325 MG PO TABS
650.0000 mg | ORAL_TABLET | Freq: Four times a day (QID) | ORAL | Status: DC | PRN
  Administered 2015-11-14: 650 mg via ORAL
  Filled 2015-11-14: qty 2

## 2015-11-14 MED ORDER — DEXTROSE 5 % IV SOLN
500.0000 mg | INTRAVENOUS | Status: DC
  Administered 2015-11-14 – 2015-11-15 (×2): 500 mg via INTRAVENOUS
  Filled 2015-11-14 (×2): qty 500

## 2015-11-14 MED ORDER — OXYCODONE-ACETAMINOPHEN 5-325 MG PO TABS
1.0000 | ORAL_TABLET | Freq: Four times a day (QID) | ORAL | Status: DC | PRN
  Administered 2015-11-14 – 2015-11-15 (×4): 1 via ORAL
  Filled 2015-11-14 (×5): qty 1

## 2015-11-14 MED ORDER — VARENICLINE TARTRATE 1 MG PO TABS
1.0000 mg | ORAL_TABLET | Freq: Two times a day (BID) | ORAL | Status: DC
  Administered 2015-11-14 – 2015-11-15 (×3): 1 mg via ORAL
  Filled 2015-11-14 (×4): qty 1

## 2015-11-14 MED ORDER — VANCOMYCIN HCL IN DEXTROSE 1-5 GM/200ML-% IV SOLN: 1000.0000 mg | Freq: Once | INTRAVENOUS | Status: DC

## 2015-11-14 MED ORDER — CEFTRIAXONE SODIUM 1 G IJ SOLR: 1.0000 g | INTRAMUSCULAR | Status: DC

## 2015-11-14 MED ORDER — WARFARIN - PHARMACIST DOSING INPATIENT
Freq: Every day | Status: DC
  Administered 2015-11-14: 18:00:00

## 2015-11-14 MED ORDER — DEXTROSE 5 % IV SOLN
1.0000 g | INTRAVENOUS | Status: DC
  Administered 2015-11-14 – 2015-11-15 (×2): 1 g via INTRAVENOUS
  Filled 2015-11-14 (×2): qty 10

## 2015-11-14 MED ORDER — EFAVIRENZ-EMTRICITAB-TENOFOVIR 600-200-300 MG PO TABS
1.0000 | ORAL_TABLET | Freq: Every day | ORAL | Status: DC
  Administered 2015-11-14 – 2015-11-15 (×2): 1 via ORAL
  Filled 2015-11-14 (×2): qty 1

## 2015-11-14 MED ORDER — SODIUM CHLORIDE 0.9 % IV BOLUS (SEPSIS)
1000.0000 mL | Freq: Once | INTRAVENOUS | Status: AC
  Administered 2015-11-14: 1000 mL via INTRAVENOUS

## 2015-11-14 MED ORDER — ENSURE ENLIVE PO LIQD
237.0000 mL | Freq: Two times a day (BID) | ORAL | Status: DC
  Administered 2015-11-14 – 2015-11-15 (×3): 237 mL via ORAL

## 2015-11-14 MED ORDER — ENOXAPARIN SODIUM 40 MG/0.4ML ~~LOC~~ SOLN: 40.0000 mg | SUBCUTANEOUS | Status: DC

## 2015-11-14 NOTE — Progress Notes (Signed)
ANTICOAGULATION CONSULT NOTE - Initial Consult  Pharmacy Consult for Warfarin Indication: pulmonary embolus  Allergies  Allergen Reactions  . Lactose Intolerance (Gi)     Patient Measurements: Height: '5\' 9"'$  (175.3 cm) Weight: 140 lb (63.504 kg) IBW/kg (Calculated) : 70.7   Vital Signs: Temp: 100.2 F (37.9 C) (06/01 0119) Temp Source: Oral (06/01 0119) BP: 141/93 mmHg (06/01 0119) Pulse Rate: 106 (06/01 0119)  Labs:  Recent Labs  11/14/15 0208 11/14/15 0222  HGB 10.6* 10.9*  HCT 30.3* 32.0*  PLT 284  --   CREATININE  --  0.60*    Estimated Creatinine Clearance: 91.5 mL/min (by C-G formula based on Cr of 0.6).   Medical History: Past Medical History  Diagnosis Date  . HIV infection ([place])     DR. [name]  . Hyperlipidemia   . Insomnia   . Lung nodule, multiple 10/24/13    CT CHEST W/O...DR. [name]  . Gynecomastia, male 10/16/13    MILD RIGHT SIDED PER RADIOLOGY STUDY  . COPD (chronic obstructive pulmonary disease) ([place])     PFT'S 10/26/13 @ [place]  . History of depression   . History of pneumonia   . Collapsed lung     d/t biopsy, right  . Radiation 06/05/14-06/20/14    whole brain 25 gray  . Encounter for antineoplastic chemotherapy 10/08/2015  . Small cell lung cancer ([facility])     Medications:   (Not in a hospital admission) Scheduled:  . dronabinol  10 mg Oral BID  . efavirenz-emtricitabine-tenofovir  1 tablet Oral Daily  . fesoterodine  8 mg Oral Daily  . HIGH-PROTEIN NUTRITIONAL SHAKE  1 each Oral BID BM  . multivitamin  1 tablet Oral Daily  . traZODone  100 mg Oral QHS   Infusions:  . azithromycin    . cefTRIAXone (ROCEPHIN)  IV    . sodium chloride      Assessment: 24 yoM w/ hx of small cell carcinoma of lung on chronic warfarin for PE.  HD '5mg'$  Mon and 2.'5mg'$  other days. Goal of Therapy:  INR 2-3    Plan:   PT/INR now  Warfarin 5 mg x1 this am  Daily PT/INR  Education  Dorrene German 11/14/2015,4:04 AM

## 2015-11-14 NOTE — ED Notes (Signed)
Patient transported to X-ray 

## 2015-11-14 NOTE — Progress Notes (Signed)
Patient seen and examined, vital stable, fever trending down, on room air, report less cough , + headache, labs at baseline, continue rocephin/zithromax, awaiting sputum culture.

## 2015-11-14 NOTE — Progress Notes (Signed)
PT Cancellation Note  Patient Details Name: Jaime Morris MRN: [id number] DOB: May 06, 1961   Cancelled Treatment:    Reason Eval/Treat Not Completed: Attempted PT eval. SW in with pt. Will check back another time if schedule allows. Thanks.    Weston Anna, MPT Pager: [phone]

## 2015-11-14 NOTE — ED Provider Notes (Signed)
CSN: [id number]     Arrival date & time 11/14/15  0114 History  By signing my name below, I, Randa Evens, attest that this documentation has been prepared under the direction and in the presence of Cashlyn Huguley, MD. Electronically Signed: Randa Evens, ED Scribe. 11/14/2015. 2:44 AM.     No chief complaint on file.  Patient is a 55 y.o. male presenting with URI. The history is provided by the patient. No language interpreter was used.  URI Presenting symptoms: cough and fever   Presenting symptoms: no congestion, no rhinorrhea and no sore throat   Severity:  Moderate Onset quality:  Gradual Duration:  5 days Chronicity:  New Relieved by:  Nothing Worsened by:  Nothing tried Ineffective treatments:  OTC medications Associated symptoms: headaches and myalgias   Risk factors: not elderly and no sick contacts    HPI Comments: Jaime Morris is a 55 y.o. male who presents to the Emergency Department complaining of flu like symptoms that began 5 days prior. Pt reports HA, fever (max temp 100.7 PTA), cough and generalized myalgias. Pt also reports vomiting but states that he is unsure if that is due to the chemo therapy. Pt states that he has tried tylenol with no relief. Pt denies recent sick contacts. Pt states that he did not receive the flu vaccination this year. Denies congestion, sore throat, rhinorrhea, rash diarrhea, abdominal pain or dysuria   Past Medical History  Diagnosis Date  . HIV infection ([place])     DR. [name]  . Hyperlipidemia   . Insomnia   . Lung nodule, multiple 10/24/13    CT CHEST W/O...DR. [name]  . Gynecomastia, male 10/16/13    MILD RIGHT SIDED PER RADIOLOGY STUDY  . COPD (chronic obstructive pulmonary disease) ([place])     PFT'S 10/26/13 @ [facility]  . History of depression   . History of pneumonia   . Collapsed lung     d/t biopsy, right  . Radiation 06/05/14-06/20/14    whole brain 25 gray  . Encounter for antineoplastic chemotherapy 10/08/2015  .  Small cell lung cancer [facility])    Past Surgical History  Procedure Laterality Date  . Foot surgery      BUNIONECTOMY  . Back surgery      L 4-5 FUSION WITH SCREW  . Colonoscopy  UTD  . Portacath placement N/A 12/08/2013    Procedure: INSERTION PORT-A-CATH;  Surgeon: Grace Isaac, MD;  Location: [facility] OR;  Service: Thoracic;  Laterality: N/A;   Family History  Problem Relation Age of Onset  . Stroke Mother   . Heart disease Father   . Sarcoidosis Sister   . Melanoma Paternal Grandfather    Social History  Substance Use Topics  . Smoking status: Current Some Day Smoker -- 0.50 packs/day for 38 years    Types: Cigarettes    Last Attempt to Quit: 04/17/2014  . Smokeless tobacco: Current User    Types: Chew    Last Attempt to Quit: 12/18/2013     Comment: recently restarted (08/13/14)  . 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  HENT: Negative for congestion, rhinorrhea and sore throat.   Respiratory: Positive for cough. Negative for shortness of breath.   Gastrointestinal: Negative for abdominal pain and diarrhea.  Genitourinary: Negative for dysuria.  Musculoskeletal: Positive for myalgias.  Neurological: Positive for headaches.  All other systems reviewed and are negative.  Allergies  Lactose intolerance (gi)  Home Medications   Prior to Admission medications   Medication Sig Start Date End Date Taking? Authorizing Provider  albuterol (PROAIR HFA) 108 (90 BASE) MCG/ACT inhaler Inhale 2 puffs into the lungs every 4 (four) hours as needed for wheezing or shortness of breath. Reported on 08/13/2015   Yes Historical Provider, MD  ATRIPLA 600-200-300 MG tablet TAKE 1 TABLET BY MOUTH DAILY 06/04/15  Yes Thayer Headings, MD  doxylamine, Sleep, (UNISOM) 25 MG tablet Take 50 mg by mouth at bedtime as needed for sleep.    Yes Historical Provider, MD  dronabinol (MARINOL) 10 MG capsule TAKE ONE CAPSULE  BY MOUTH TWICE DAILY BEFORE A MEAL 04/23/15  Yes Thayer Headings, MD  emollient (BIAFINE) cream Apply topically as needed. Reported on 08/13/2015   Yes Historical Provider, MD  lidocaine-prilocaine (EMLA) cream Apply 1 application topically as needed. 09/17/15  Yes Curt Bears, MD  Melatonin 10 MG TABS Take 10 mg by mouth at bedtime as needed (sleep).   Yes Historical Provider, MD  Multiple Vitamin (MULTIVITAMIN) tablet Take 1 tablet by mouth daily.   Yes Historical Provider, MD  Nutritional Supplements (HIGH-PROTEIN NUTRITIONAL SHAKE) LIQD Take 1 each by mouth 2 (two) times daily between meals.   Yes Historical Provider, MD  oxyCODONE-acetaminophen (PERCOCET/ROXICET) 5-325 MG tablet Take 1 tablet by mouth every 6 (six) hours as needed for severe pain. 09/17/15  Yes Curt Bears, MD  prochlorperazine (COMPAZINE) 10 MG tablet Take 1 tablet (10 mg total) by mouth every 6 (six) hours as needed for nausea or vomiting. 09/17/15  Yes Curt Bears, MD  tolterodine (DETROL LA) 4 MG 24 hr capsule take 1 capsule daily 10/14/15  Yes Historical Provider, MD  traZODone (DESYREL) 100 MG tablet Take 100 mg by mouth at bedtime.   Yes Historical Provider, MD  varenicline (CHANTIX) 1 MG tablet Take 1 mg by mouth 2 (two) times daily. Reported on 08/13/2015   Yes Historical Provider, MD  vitamin B-12 (CYANOCOBALAMIN) 500 MCG tablet Take 500 mcg by mouth daily.   Yes Historical Provider, MD  warfarin (COUMADIN) 5 MG tablet Take 1 tablet (5 mg total) by mouth as directed. Patient taking differently: Take 5 mg by mouth as directed. Take '5mg'$  on Monday and 2.'5mg'$  on other days 11/05/15  Yes Curt Bears, MD   BP 141/93 mmHg  Pulse 106  Temp(Src) 100.2 F (37.9 C) (Oral)  Resp 18  Ht '5\' 9"'$  (1.753 m)  Wt 140 lb (63.504 kg)  BMI 20.67 kg/m2  SpO2 99%    Physical Exam  Constitutional: He is oriented to person, place, and time. He appears well-developed and well-nourished. No distress.  HENT:  Head: Normocephalic and  atraumatic.  Mouth/Throat: Oropharynx is clear and moist. No oropharyngeal exudate.  Eyes: Conjunctivae and EOM are normal. Pupils are equal, round, and reactive to light.  Neck: Normal range of motion. Neck supple. No tracheal deviation present.  Cardiovascular: Normal rate, regular rhythm and normal heart sounds.   Pulmonary/Chest: Effort normal and breath sounds normal. No respiratory distress. He has no wheezes. He has no rales.  Abdominal: Soft. Bowel sounds are normal. There is no tenderness. There is no rebound and no guarding.  Musculoskeletal: Normal range of motion.  Lymphadenopathy:    He has no cervical adenopathy.  Neurological: He is alert and oriented to person, place, and time.  Skin: Skin is warm and dry.  Psychiatric: He has a normal mood and affect. His behavior is  normal.  Nursing note and vitals reviewed.   ED Course  Procedures (including critical care time) DIAGNOSTIC STUDIES: Oxygen Saturation is 99% on RA, normal by my interpretation.    COORDINATION OF CARE: 2:43 AM-Discussed treatment plan with pt at bedside and pt agreed to plan.     Labs Review Labs Reviewed  CBC WITH DIFFERENTIAL/PLATELET - Abnormal; Notable for the following:    RBC 3.21 (*)    Hemoglobin 10.6 (*)    HCT 30.3 (*)    RDW 15.6 (*)    All other components within normal limits  I-STAT CHEM 8, ED - Abnormal; Notable for the following:    Sodium 134 (*)    Chloride 96 (*)    BUN <3 (*)    Creatinine, Ser 0.60 (*)    Glucose, Bld 106 (*)    Hemoglobin 10.9 (*)    HCT 32.0 (*)    All other components within normal limits  CULTURE, BLOOD (ROUTINE X 2)  CULTURE, BLOOD (ROUTINE X 2)  URINE CULTURE  URINALYSIS, ROUTINE W REFLEX MICROSCOPIC (NOT AT [facility])  I-STAT CG4 LACTIC ACID, ED    Imaging Review Dg Chest 2 View  11/14/2015  CLINICAL DATA:  Acute onset of fever, cough and body aches. Patient on chemotherapy for lung cancer. Initial encounter. EXAM: CHEST  2 VIEW COMPARISON:   Chest radiograph performed 03/27/2014, and CT of the chest performed 10/28/2015 FINDINGS: The lungs are well-aerated. Peribronchial thickening is noted. Mild vascular congestion is seen. Minimal right basilar opacity raises concern for mild pneumonia. There is no evidence of pleural effusion or pneumothorax. The heart is normal in size; the mediastinal contour is within normal limits. A left-sided chest port is noted ending about the mid SVC. No acute osseous abnormalities are seen. IMPRESSION: 1. Minimal right basilar opacity raises concern for mild pneumonia. 2. Mild vascular congestion noted.  Peribronchial thickening seen. Electronically Signed   By: Garald Balding M.D.   On: 11/14/2015 03:12   I have personally reviewed and evaluated these images and lab results as part of my medical decision-making.   EKG Interpretation None      MDM   Final diagnoses:  None   Filed Vitals:   11/14/15 0400 11/14/15 0430  BP: 140/98 142/96  Pulse:    Temp:    Resp: 14 17    Results for orders placed or performed during the hospital encounter of 11/14/15  CBC with Differential/Platelet  Result Value Ref Range   WBC 9.4 4.0 - 10.5 K/uL   RBC 3.21 (L) 4.22 - 5.81 MIL/uL   Hemoglobin 10.6 (L) 13.0 - 17.0 g/dL   HCT 30.3 (L) 39.0 - 52.0 %   MCV 94.4 78.0 - 100.0 fL   MCH 33.0 26.0 - 34.0 pg   MCHC 35.0 30.0 - 36.0 g/dL   RDW 15.6 (H) 11.5 - 15.5 %   Platelets 284 150 - 400 K/uL   Neutrophils Relative % 76 %   Neutro Abs 7.0 1.7 - 7.7 K/uL   Lymphocytes Relative 18 %   Lymphs Abs 1.7 0.7 - 4.0 K/uL   Monocytes Relative 6 %   Monocytes Absolute 0.6 0.1 - 1.0 K/uL   Eosinophils Relative 0 %   Eosinophils Absolute 0.0 0.0 - 0.7 K/uL   Basophils Relative 0 %   Basophils Absolute 0.0 0.0 - 0.1 K/uL  Urinalysis, Routine w reflex microscopic (not at [facility])  Result Value Ref Range   Color, Urine YELLOW YELLOW   APPearance  CLEAR CLEAR   Specific Gravity, Urine 1.005 1.005 - 1.030   pH 8.0 5.0 -  8.0   Glucose, UA NEGATIVE NEGATIVE mg/dL   Hgb urine dipstick NEGATIVE NEGATIVE   Bilirubin Urine NEGATIVE NEGATIVE   Ketones, ur NEGATIVE NEGATIVE mg/dL   Protein, ur NEGATIVE NEGATIVE mg/dL   Nitrite NEGATIVE NEGATIVE   Leukocytes, UA NEGATIVE NEGATIVE  I-Stat Chem 8, ED  Result Value Ref Range   Sodium 134 (L) 135 - 145 mmol/L   Potassium 4.1 3.5 - 5.1 mmol/L   Chloride 96 (L) 101 - 111 mmol/L   BUN <3 (L) 6 - 20 mg/dL   Creatinine, Ser 0.60 (L) 0.61 - 1.24 mg/dL   Glucose, Bld 106 (H) 65 - 99 mg/dL   Calcium, Ion 1.13 1.12 - 1.23 mmol/L   TCO2 25 0 - 100 mmol/L   Hemoglobin 10.9 (L) 13.0 - 17.0 g/dL   HCT 32.0 (L) 39.0 - 52.0 %  I-Stat CG4 Lactic Acid, ED  Result Value Ref Range   Lactic Acid, Venous 1.45 0.5 - 2.0 mmol/L   Dg Chest 2 View  11/14/2015  CLINICAL DATA:  Acute onset of fever, cough and body aches. Patient on chemotherapy for lung cancer. Initial encounter. EXAM: CHEST  2 VIEW COMPARISON:  Chest radiograph performed 03/27/2014, and CT of the chest performed 10/28/2015 FINDINGS: The lungs are well-aerated. Peribronchial thickening is noted. Mild vascular congestion is seen. Minimal right basilar opacity raises concern for mild pneumonia. There is no evidence of pleural effusion or pneumothorax. The heart is normal in size; the mediastinal contour is within normal limits. A left-sided chest port is noted ending about the mid SVC. No acute osseous abnormalities are seen. IMPRESSION: 1. Minimal right basilar opacity raises concern for mild pneumonia. 2. Mild vascular congestion noted.  Peribronchial thickening seen. Electronically Signed   By: Garald Balding M.D.   On: 11/14/2015 03:12   Ct Chest W Contrast  10/28/2015  CLINICAL DATA:  Patient with history of lung cancer diagnosed 11/2013. On chemotherapy. EXAM: CT CHEST, ABDOMEN, AND PELVIS WITH CONTRAST TECHNIQUE: Multidetector CT imaging of the chest, abdomen and pelvis was performed following the standard protocol during  bolus administration of intravenous contrast. CONTRAST:  140m ISOVUE-300 IOPAMIDOL (ISOVUE-300) INJECTION 61% COMPARISON:  CT CAP 08/12/2015 FINDINGS: CT CHEST FINDINGS Mediastinum/Lymph Nodes: No axillary lymphadenopathy. Grossly unchanged mediastinal adenopathy including an 11 mm right peritracheal lymph node (image 25; series 2) and a 9 mm subcarinal lymph node (image 36; series 2). Stable heart size. Trace pericardial fluid. Coronary arterial vascular calcifications. Unchanged 10 mm right hilar lymph node (image 37; series 2). Unchanged right infrahilar lymph node measuring 11 mm (image 40; series 2). Lungs/Pleura: Re- demonstrated occlusion of the right middle lobe bronchus. There is associated right middle lobe atelectasis. Unchanged 8 mm peripheral nodule within the right middle lobe (image 90; series 4). Extensive centrilobular and paraseptal emphysematous change. Grossly unchanged bilateral 3 mm nodules. Additionally within the right upper lobe (image 109; series 4) there is a new 9 mm nodule. No pleural effusion or pneumothorax. Musculoskeletal: Multiple new sclerotic lesions demonstrated throughout the thoracic spine including a 1.8 cm lesion within the T3 vertebral body, a 0.9 cm lesion within the T8 vertebral body and a 1.0 cm lesion within the T9 vertebral body. CT ABDOMEN PELVIS FINDINGS Hepatobiliary: Interval development of a 1.1 cm low-attenuation lesion within the central liver (image 61; series 2). Interval increase in size of low-attenuation lesion within the peripheral left hepatic lobe (  image 59; series 2) measuring 1.1 x 1.6 cm, previously 1.2 x 0.9 cm. This interval development of a 1.0 cm lesion within the inferior right hepatic lobe (image 83; series 2). There is an additional adjacent new 1.1 cm lesion (image 80; series 2). Slight interval increase in size of subcapsular lesion within the posterior right hepatic lobe measuring 1.0 x 0.5 cm (image 72; series 2), previous measuring 0.8 x  0.7 cm. Additional new 0.5 cm lesion near the hepatic dome (image 58; series 2). Gallbladder is unremarkable. Pancreas: Unremarkable Spleen: Unremarkable Adrenals/Urinary Tract: Interval increase in size of right adrenal mass measuring 1.6 x 2.5 cm (image 68; series 2), previously measuring 1.3 x 2.1 cm. Grossly unchanged left adrenal nodule measuring 1.1 x 0.9 cm (image 68; series 2), previously 1.1 x 0.9 cm. Kidneys enhance symmetrically with contrast. Unchanged too small to characterize low-attenuation lesions. No hydronephrosis. Urinary bladder is unremarkable. Stomach/Bowel: The appendix is normal. No abnormal bowel wall thickening or evidence for bowel obstruction. No free fluid or free intraperitoneal air. Vascular/Lymphatic: Normal caliber abdominal aorta. Peripheral calcified atherosclerotic plaque. No retroperitoneal lymphadenopathy. Other: Prostate is unremarkable. Musculoskeletal: Interval increase in size of S1 lytic lesion measuring 3.5 x 2.6 cm (image 100; series 2), previously measuring 2.5 x 1.7 cm. Re- demonstrated L4-S1 spinal fusion. Interval development of a 2.5 cm sclerotic lesion within the L1 vertebral body. IMPRESSION: Interval development of new and enlargement of additional low-attenuation liver lesions compatible with metastatic disease. Interval enlargement of previously identified sacral lytic lesion. Multiple new osseous metastatic lesions are demonstrated. Grossly unchanged mediastinal and right hilar adenopathy. Interval development of a new 9 mm nodule within the right upper lobe which is nonspecific and may represent metastasis or a focal infectious/ inflammatory process. Additional smaller nodules within the lungs bilaterally are stable. Interval enlargement of right adrenal mass. Electronically Signed   By: Lovey Newcomer M.D.   On: 10/28/2015 09:36   Ct Abdomen Pelvis W Contrast  10/28/2015  CLINICAL DATA:  Patient with history of lung cancer diagnosed 11/2013. On chemotherapy.  EXAM: CT CHEST, ABDOMEN, AND PELVIS WITH CONTRAST TECHNIQUE: Multidetector CT imaging of the chest, abdomen and pelvis was performed following the standard protocol during bolus administration of intravenous contrast. CONTRAST:  164m ISOVUE-300 IOPAMIDOL (ISOVUE-300) INJECTION 61% COMPARISON:  CT CAP 08/12/2015 FINDINGS: CT CHEST FINDINGS Mediastinum/Lymph Nodes: No axillary lymphadenopathy. Grossly unchanged mediastinal adenopathy including an 11 mm right peritracheal lymph node (image 25; series 2) and a 9 mm subcarinal lymph node (image 36; series 2). Stable heart size. Trace pericardial fluid. Coronary arterial vascular calcifications. Unchanged 10 mm right hilar lymph node (image 37; series 2). Unchanged right infrahilar lymph node measuring 11 mm (image 40; series 2). Lungs/Pleura: Re- demonstrated occlusion of the right middle lobe bronchus. There is associated right middle lobe atelectasis. Unchanged 8 mm peripheral nodule within the right middle lobe (image 90; series 4). Extensive centrilobular and paraseptal emphysematous change. Grossly unchanged bilateral 3 mm nodules. Additionally within the right upper lobe (image 109; series 4) there is a new 9 mm nodule. No pleural effusion or pneumothorax. Musculoskeletal: Multiple new sclerotic lesions demonstrated throughout the thoracic spine including a 1.8 cm lesion within the T3 vertebral body, a 0.9 cm lesion within the T8 vertebral body and a 1.0 cm lesion within the T9 vertebral body. CT ABDOMEN PELVIS FINDINGS Hepatobiliary: Interval development of a 1.1 cm low-attenuation lesion within the central liver (image 61; series 2). Interval increase in size of low-attenuation lesion within the  peripheral left hepatic lobe (image 59; series 2) measuring 1.1 x 1.6 cm, previously 1.2 x 0.9 cm. This interval development of a 1.0 cm lesion within the inferior right hepatic lobe (image 83; series 2). There is an additional adjacent new 1.1 cm lesion (image 80;  series 2). Slight interval increase in size of subcapsular lesion within the posterior right hepatic lobe measuring 1.0 x 0.5 cm (image 72; series 2), previous measuring 0.8 x 0.7 cm. Additional new 0.5 cm lesion near the hepatic dome (image 58; series 2). Gallbladder is unremarkable. Pancreas: Unremarkable Spleen: Unremarkable Adrenals/Urinary Tract: Interval increase in size of right adrenal mass measuring 1.6 x 2.5 cm (image 68; series 2), previously measuring 1.3 x 2.1 cm. Grossly unchanged left adrenal nodule measuring 1.1 x 0.9 cm (image 68; series 2), previously 1.1 x 0.9 cm. Kidneys enhance symmetrically with contrast. Unchanged too small to characterize low-attenuation lesions. No hydronephrosis. Urinary bladder is unremarkable. Stomach/Bowel: The appendix is normal. No abnormal bowel wall thickening or evidence for bowel obstruction. No free fluid or free intraperitoneal air. Vascular/Lymphatic: Normal caliber abdominal aorta. Peripheral calcified atherosclerotic plaque. No retroperitoneal lymphadenopathy. Other: Prostate is unremarkable. Musculoskeletal: Interval increase in size of S1 lytic lesion measuring 3.5 x 2.6 cm (image 100; series 2), previously measuring 2.5 x 1.7 cm. Re- demonstrated L4-S1 spinal fusion. Interval development of a 2.5 cm sclerotic lesion within the L1 vertebral body. IMPRESSION: Interval development of new and enlargement of additional low-attenuation liver lesions compatible with metastatic disease. Interval enlargement of previously identified sacral lytic lesion. Multiple new osseous metastatic lesions are demonstrated. Grossly unchanged mediastinal and right hilar adenopathy. Interval development of a new 9 mm nodule within the right upper lobe which is nonspecific and may represent metastasis or a focal infectious/ inflammatory process. Additional smaller nodules within the lungs bilaterally are stable. Interval enlargement of right adrenal mass. Electronically Signed   By:  Lovey Newcomer M.D.   On: 10/28/2015 09:36    Medications  azithromycin (ZITHROMAX) 500 mg in dextrose 5 % 250 mL IVPB (not administered)  cefTRIAXone (ROCEPHIN) 1 g in dextrose 5 % 50 mL IVPB (1 g Intravenous New Bag/Given 11/14/15 0443)  efavirenz-emtricitabine-tenofovir (ATRIPLA) 600-200-300 MG per tablet 1 tablet (not administered)  oxyCODONE-acetaminophen (PERCOCET/ROXICET) 5-325 MG per tablet 1 tablet (not administered)  prochlorperazine (COMPAZINE) tablet 10 mg (not administered)  HIGH-PROTEIN NUTRITIONAL SHAKE LIQD 1 each (not administered)  fesoterodine (TOVIAZ) tablet 8 mg (not administered)  multivitamin tablet 1 tablet (not administered)  dronabinol (MARINOL) capsule 10 mg (not administered)  doxylamine (Sleep) (UNISOM) tablet 50 mg (not administered)  traZODone (DESYREL) tablet 100 mg (not administered)  albuterol (PROVENTIL) (2.5 MG/3ML) 0.083% nebulizer solution 3 mL (not administered)  sodium chloride 0.9 % bolus 1,000 mL (1,000 mLs Intravenous New Bag/Given 11/14/15 0442)   Admit to med surg inpatient  I personally performed the services described in this documentation, which was scribed in my presence. The recorded information has been reviewed and is accurate.        Veatrice Kells, MD 11/14/15 [phone]

## 2015-11-14 NOTE — ED Notes (Signed)
Nurse will draw labs. 

## 2015-11-14 NOTE — Evaluation (Signed)
Physical Therapy Evaluation-1x Patient Details Name: Jaime Morris MRN: [id number] DOB: [birth year] Today's Date: 11/14/2015   History of Present Illness  55 yo male admitted with Pna. Hx of HIV, COPD, SCLC on chemo, mets to liver, bones, adrenals.   Clinical Impression  On eval, pt was Mod Ind with mobility-walked ~400 feet. Pt tolerated distance well. No LOB during session. Do not anticipate any follow up PT needs. Pt states he has a cane at home that he uses when needed. 1x eval. Will sign off.     Follow Up Recommendations No PT follow up    Equipment Recommendations  None recommended by PT (pt already has cane at home)    Recommendations for Other Services       Precautions / Restrictions Precautions Precautions: Fall Restrictions Weight Bearing Restrictions: No      Mobility  Bed Mobility Overal bed mobility: Independent                Transfers Overall transfer level: Independent                  Ambulation/Gait Ambulation/Gait assistance: Modified independent (Device/Increase time) Ambulation Distance (Feet): 400 Feet Assistive device: None Gait Pattern/deviations: WFL(Within Functional Limits)     General Gait Details: good gait speed. Pt tolerated distance well. No LOB.   Stairs            Wheelchair Mobility    Modified Rankin (Stroke Patients Only)       Balance Overall balance assessment: History of Falls;No apparent balance deficits (not formally assessed)         Standing balance support: During functional activity Standing balance-Leahy Scale: Good                               Pertinent Vitals/Pain Pain Assessment: 0-10 Pain Score: 2  Pain Location: L hip Pain Intervention(s): Monitored during session    Home Living Family/patient expects to be discharged to:: Private residence Living Arrangements: Alone   Type of Home: Apartment Home Access: Stairs to enter   Technical brewer of Steps:  1 flight Home Layout: One level Home Equipment: None      Prior Function Level of Independence: Independent               Hand Dominance        Extremity/Trunk Assessment   Upper Extremity Assessment: Overall WFL for tasks assessed           Lower Extremity Assessment: Overall WFL for tasks assessed      Cervical / Trunk Assessment: Normal  Communication   Communication: No difficulties  Cognition Arousal/Alertness: Awake/alert Behavior During Therapy: WFL for tasks assessed/performed Overall Cognitive Status: Within Functional Limits for tasks assessed                      General Comments      Exercises        Assessment/Plan    PT Assessment Patent does not need any further PT services  PT Diagnosis     PT Problem List    PT Treatment Interventions     PT Goals (Current goals can be found in the Care Plan section) Acute Rehab PT Goals Patient Stated Goal: home soon PT Goal Formulation: All assessment and education complete, DC therapy    Frequency     Barriers to discharge        Co-evaluation  End of Session   Activity Tolerance: Patient tolerated treatment well Patient left: in bed;with call bell/phone within reach           Time: 3838-1840 PT Time Calculation (min) (ACUTE ONLY): 10 min   Charges:   PT Evaluation $PT Eval Low Complexity: 1 Procedure     PT G Codes:        Weston Anna, MPT Pager: [phone]

## 2015-11-14 NOTE — ED Notes (Signed)
Patient presents for generalized body aches, fever (100.7), HA, cough x4 days. Denies vomiting, sensitivity to light or sound, double or blurred vision. Last chemo 5/25, next chemo tx today.

## 2015-11-14 NOTE — H&P (Signed)
History and Physical    Jaime Morris ZYS:[id number] DOB: Nov 17, 1960 DOA: 11/14/2015   PCP: Irven Shelling, MD Chief Complaint: No chief complaint on file.   HPI: Jaime Morris is a 55 y.o. 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  HIV on HAART.  Patient presents to the ED with c/o fever and cough.  Symptoms ongoing for the past 5 days, associated with headaches when running fever.  Headaches have been intermittent.  His last chemo dose was 7 days ago on the 25th.  No known sick contacts.  ED Course: Patient found to have what appears to be PNA at R lung base.  Tm 100.2 in ED, initially slightly tachycardic to 106 this improves with IVF and as his fever improves.  WBC 9.4k so thankfully not neutropenic.  Review of Systems: As per HPI otherwise 10 point review of systems negative.    Past Medical History  Diagnosis Date  . HIV infection ([place])     DR. [name]  . Hyperlipidemia   . Insomnia   . Lung nodule, multiple 10/24/13    CT CHEST W/O...DR. [name]  . Gynecomastia, male 10/16/13    MILD RIGHT SIDED PER RADIOLOGY STUDY  . COPD (chronic obstructive pulmonary disease) ([place])     PFT'S 10/26/13 @ [facility]  . History of depression   . History of pneumonia   . Collapsed lung     d/t biopsy, right  . Radiation 06/05/14-06/20/14    whole brain 25 gray  . Encounter for antineoplastic chemotherapy 10/08/2015  . Small cell lung cancer [facility])     Past Surgical History  Procedure Laterality Date  . Foot surgery      BUNIONECTOMY  . Back surgery      L 4-5 FUSION WITH SCREW  . Colonoscopy  UTD  . Portacath placement N/A 12/08/2013    Procedure: INSERTION PORT-A-CATH;  Surgeon: Grace Isaac, MD;  Location: [place];  Service: Thoracic;  Laterality: N/A;     reports that he has been smoking Cigarettes.  He has a 19 pack-year smoking  history. His smokeless tobacco use includes Chew. He reports that he does not drink alcohol or use illicit drugs.  Allergies  Allergen Reactions  . Lactose Intolerance (Gi)     Family History  Problem Relation Age of Onset  . Stroke Mother   . Heart disease Father   . Sarcoidosis Sister   . Melanoma Paternal Grandfather      Prior to Admission medications   Medication Sig Start Date End Date Taking? Authorizing Provider  albuterol (PROAIR HFA) 108 (90 BASE) MCG/ACT inhaler Inhale 2 puffs into the lungs every 4 (four) hours as needed for wheezing or shortness of breath. Reported on 08/13/2015   Yes Historical Provider, MD  ATRIPLA 600-200-300 MG tablet TAKE 1 TABLET BY MOUTH DAILY 06/04/15  Yes Thayer Headings, MD  doxylamine, Sleep, (UNISOM) 25 MG tablet Take 50 mg by mouth at bedtime as needed for sleep.    Yes Historical Provider, MD  dronabinol (MARINOL) 10 MG capsule TAKE ONE CAPSULE BY MOUTH TWICE DAILY BEFORE A MEAL 04/23/15  Yes Thayer Headings, MD  emollient (BIAFINE) cream Apply topically as needed. Reported on 08/13/2015   Yes Historical Provider, MD  lidocaine-prilocaine (EMLA) cream Apply 1 application topically as needed. 09/17/15  Yes Curt Bears, MD  Melatonin 10 MG TABS Take  10 mg by mouth at bedtime as needed (sleep).   Yes Historical Provider, MD  Multiple Vitamin (MULTIVITAMIN) tablet Take 1 tablet by mouth daily.   Yes Historical Provider, MD  Nutritional Supplements (HIGH-PROTEIN NUTRITIONAL SHAKE) LIQD Take 1 each by mouth 2 (two) times daily between meals.   Yes Historical Provider, MD  oxyCODONE-acetaminophen (PERCOCET/ROXICET) 5-325 MG tablet Take 1 tablet by mouth every 6 (six) hours as needed for severe pain. 09/17/15  Yes Curt Bears, MD  prochlorperazine (COMPAZINE) 10 MG tablet Take 1 tablet (10 mg total) by mouth every 6 (six) hours as needed for nausea or vomiting. 09/17/15  Yes Curt Bears, MD  tolterodine (DETROL LA) 4 MG 24 hr capsule take 1 capsule  daily 10/14/15  Yes Historical Provider, MD  traZODone (DESYREL) 100 MG tablet Take 100 mg by mouth at bedtime.   Yes Historical Provider, MD  varenicline (CHANTIX) 1 MG tablet Take 1 mg by mouth 2 (two) times daily. Reported on 08/13/2015   Yes Historical Provider, MD  vitamin B-12 (CYANOCOBALAMIN) 500 MCG tablet Take 500 mcg by mouth daily.   Yes Historical Provider, MD  warfarin (COUMADIN) 5 MG tablet Take 1 tablet (5 mg total) by mouth as directed. Patient taking differently: Take 5 mg by mouth as directed. Take '5mg'$  on Monday and 2.'5mg'$  on other days 11/05/15  Yes Curt Bears, MD    Physical Exam: Filed Vitals:   11/14/15 0119 11/14/15 0330 11/14/15 0400 11/14/15 0430  BP: 141/93 139/91 140/98 142/96  Pulse: 106     Temp: 100.2 F (37.9 C)     TempSrc: Oral     Resp: '18 17 14 17  '$ Height: '5\' 9"'$  (1.753 m)     Weight: 63.504 kg (140 lb)     SpO2: 99% 95% 100%       Constitutional: NAD, calm, comfortable Eyes: PERRL, lids and conjunctivae normal ENMT: Mucous membranes are moist. Posterior pharynx clear of any exudate or lesions.Normal dentition.  Neck: normal, supple, no masses, no thyromegaly Respiratory: clear to auscultation bilaterally, no wheezing, no crackles. Normal respiratory effort. No accessory muscle use.  Cardiovascular: Regular rate and rhythm, no murmurs / rubs / gallops. No extremity edema. 2+ pedal pulses. No carotid bruits.  Abdomen: no tenderness, no masses palpated. No hepatosplenomegaly. Bowel sounds positive.  Musculoskeletal: no clubbing / cyanosis. No joint deformity upper and lower extremities. Good ROM, no contractures. Normal muscle tone.  Skin: no rashes, lesions, ulcers. No induration Neurologic: CN 2-12 grossly intact. Sensation intact, DTR normal. Strength 5/5 in all 4.  Psychiatric: Normal judgment and insight. Alert and oriented x 3. Normal mood.    Labs on Admission: I have personally reviewed following labs and imaging studies  CBC:  Recent  Labs Lab 11/07/15 0740 11/14/15 0208 11/14/15 0222  WBC 8.3 9.4  --   NEUTROABS 5.8 7.0  --   HGB 12.9* 10.6* 10.9*  HCT 37.6* 30.3* 32.0*  MCV 96.7 94.4  --   PLT 276 284  --    Basic Metabolic Panel:  Recent Labs Lab 11/07/15 0740 11/14/15 0222  NA 139 134*  K 3.8 4.1  CL  --  96*  CO2 24  --   GLUCOSE 179* 106*  BUN 4.5* <3*  CREATININE 0.8 0.60*  CALCIUM 9.1  --    GFR: Estimated Creatinine Clearance: 91.5 mL/min (by C-G formula based on Cr of 0.6). Liver Function Tests:  Recent Labs Lab 11/07/15 0740  AST 38*  ALT 17  ALKPHOS  132  BILITOT 0.38  PROT 6.8  ALBUMIN 3.3*   No results for input(s): LIPASE, AMYLASE in the last 168 hours. No results for input(s): AMMONIA in the last 168 hours. Coagulation Profile:  Recent Labs Lab 11/14/15 0446  INR 1.28   Cardiac Enzymes: No results for input(s): CKTOTAL, CKMB, CKMBINDEX, TROPONINI in the last 168 hours. BNP (last 3 results) No results for input(s): PROBNP in the last 8760 hours. HbA1C: No results for input(s): HGBA1C in the last 72 hours. CBG: No results for input(s): GLUCAP in the last 168 hours. Lipid Profile: No results for input(s): CHOL, HDL, LDLCALC, TRIG, CHOLHDL, LDLDIRECT in the last 72 hours. Thyroid Function Tests: No results for input(s): TSH, T4TOTAL, FREET4, T3FREE, THYROIDAB in the last 72 hours. Anemia Panel: No results for input(s): VITAMINB12, FOLATE, FERRITIN, TIBC, IRON, RETICCTPCT in the last 72 hours. Urine analysis:    Component Value Date/Time   COLORURINE YELLOW 11/14/2015 0154   APPEARANCEUR CLEAR 11/14/2015 0154   LABSPEC 1.005 11/14/2015 0154   LABSPEC 1.015 03/27/2014 1623   PHURINE 8.0 11/14/2015 0154   PHURINE 6.0 03/27/2014 1623   GLUCOSEU NEGATIVE 11/14/2015 0154   GLUCOSEU Negative 03/27/2014 1623   GLUCOSEU NEG mg/dL 02/18/2010 2051   HGBUR NEGATIVE 11/14/2015 0154   HGBUR Moderate 03/27/2014 1623   BILIRUBINUR NEGATIVE 11/14/2015 0154   BILIRUBINUR  Negative 03/27/2014 1623   KETONESUR NEGATIVE 11/14/2015 0154   KETONESUR Negative 03/27/2014 1623   PROTEINUR NEGATIVE 11/14/2015 0154   PROTEINUR Negative 03/27/2014 1623   UROBILINOGEN 0.2 03/27/2014 1623   UROBILINOGEN 0.2 02/18/2010 2051   NITRITE NEGATIVE 11/14/2015 0154   NITRITE Negative 03/27/2014 1623   LEUKOCYTESUR NEGATIVE 11/14/2015 0154   LEUKOCYTESUR Negative 03/27/2014 1623   Sepsis Labs: '@LABRCNTIP'$ (procalcitonin:4,lacticidven:4) )No results found for this or any previous visit (from the past 240 hour(s)).   Radiological Exams on Admission: Dg Chest 2 View  11/14/2015  CLINICAL DATA:  Acute onset of fever, cough and body aches. Patient on chemotherapy for lung cancer. Initial encounter. EXAM: CHEST  2 VIEW COMPARISON:  Chest radiograph performed 03/27/2014, and CT of the chest performed 10/28/2015 FINDINGS: The lungs are well-aerated. Peribronchial thickening is noted. Mild vascular congestion is seen. Minimal right basilar opacity raises concern for mild pneumonia. There is no evidence of pleural effusion or pneumothorax. The heart is normal in size; the mediastinal contour is within normal limits. A left-sided chest port is noted ending about the mid SVC. No acute osseous abnormalities are seen. IMPRESSION: 1. Minimal right basilar opacity raises concern for mild pneumonia. 2. Mild vascular congestion noted.  Peribronchial thickening seen. Electronically Signed   By: Garald Balding M.D.   On: 11/14/2015 03:12    EKG: Independently reviewed.  Assessment/Plan Principal Problem:   CAP (community acquired pneumonia) Active Problems:   Human immunodeficiency virus (HIV) disease ([place])   Small cell carcinoma of lung ([place])   Right middle lobe pneumonia  CAP -  PNA pathway  Appears to be at "right base" on CXR, suspicious of RML however as he does have known chronic obstruction of the RML bronchus (see his CT on the 15th of last month), and previous PNA / post obstructive PNA  in this lobe in the past (although last episode was apparently a year ago).  rocephin and azithromycin  Cultures pending  Tylenol PRN fever  SCLC -  Last chemo on the 25th  Actually had appointment at cancer center for chemotherapy today  I assume they probably wont be doing  chemo today in light of new CAP  Left consult in computer system so he shows up on their list as part of our protocol when admitting a chemo patient.  May wish to give cancer center a call later this morning to let them know of his admission.  HIV - Continue HAART     DVT prophylaxis: Continue coumadin Code Status: Full Family Communication: Family at bedside Consults called: None called, but left consult order in epic as per our routine when admitting a chemotherapy patient Admission status: Admit to inpatient   Etta Quill DO [facility] Pager [phone] from 7PM-7AM  If 7AM-7PM, please contact the day physician for the patient [email or web address] Password [email or web address]  11/14/2015, 5:06 AM

## 2015-11-15 ENCOUNTER — Telehealth: Payer: Self-pay | Admitting: Medical Oncology

## 2015-11-15 DIAGNOSIS — B20 Human immunodeficiency virus [HIV] disease: Secondary | ICD-10-CM

## 2015-11-15 DIAGNOSIS — C3490 Malignant neoplasm of unspecified part of unspecified bronchus or lung: Secondary | ICD-10-CM

## 2015-11-15 LAB — MAGNESIUM: MAGNESIUM: 2.2 mg/dL (ref 1.7–2.4)

## 2015-11-15 LAB — BASIC METABOLIC PANEL
Anion gap: 9 (ref 5–15)
CALCIUM: 8.7 mg/dL — AB (ref 8.9–10.3)
CO2: 24 mmol/L (ref 22–32)
CREATININE: 0.63 mg/dL (ref 0.61–1.24)
Chloride: 106 mmol/L (ref 101–111)
GFR calc Af Amer: >60 mL/min (ref >60)
Glucose, Bld: 88 mg/dL (ref 65–99)
POTASSIUM: 3.4 mmol/L — AB (ref 3.5–5.1)
SODIUM: 139 mmol/L (ref 135–145)

## 2015-11-15 LAB — CBC
HCT: 44.7 % (ref 39.0–52.0)
Hemoglobin: 15.7 g/dL (ref 13.0–17.0)
MCH: 33.1 pg (ref 26.0–34.0)
MCHC: 35.1 g/dL (ref 30.0–36.0)
MCV: 94.3 fL (ref 78.0–100.0)
PLATELETS: 164 10*3/uL (ref 150–400)
RBC: 4.74 MIL/uL (ref 4.22–5.81)
RDW: 16 % — AB (ref 11.5–15.5)
WBC: 2.9 10*3/uL — ABNORMAL LOW (ref 4.0–10.5)

## 2015-11-15 LAB — URINE CULTURE
CULTURE: NO GROWTH
Special Requests: NORMAL

## 2015-11-15 LAB — PROTIME-INR
INR: 1.3 (ref 0.00–1.49)
PROTHROMBIN TIME: 15.9 s — AB (ref 11.6–15.2)

## 2015-11-15 LAB — TSH: TSH: 0.789 u[IU]/mL (ref 0.350–4.500)

## 2015-11-15 MED ORDER — POTASSIUM CHLORIDE CRYS ER 20 MEQ PO TBCR
40.0000 meq | EXTENDED_RELEASE_TABLET | Freq: Once | ORAL | Status: AC
  Administered 2015-11-15: 40 meq via ORAL
  Filled 2015-11-15: qty 2

## 2015-11-15 MED ORDER — HEPARIN SOD (PORK) LOCK FLUSH 100 UNIT/ML IV SOLN
500.0000 [IU] | INTRAVENOUS | Status: DC | PRN
  Filled 2015-11-15: qty 5

## 2015-11-15 MED ORDER — AZITHROMYCIN 250 MG PO TABS: 500.0000 mg | ORAL_TABLET | Freq: Every day | ORAL | Status: DC

## 2015-11-15 MED ORDER — WARFARIN SODIUM 5 MG PO TABS: 7.5000 mg | ORAL_TABLET | Freq: Once | ORAL | Status: DC

## 2015-11-15 MED ORDER — AMOXICILLIN-POT CLAVULANATE 875-125 MG PO TABS: 1.0000 | ORAL_TABLET | Freq: Two times a day (BID) | ORAL | Status: DC

## 2015-11-15 MED ORDER — HEPARIN SOD (PORK) LOCK FLUSH 100 UNIT/ML IV SOLN: 500.0000 [IU] | INTRAVENOUS | Status: DC

## 2015-11-15 NOTE — Progress Notes (Signed)
ANTICOAGULATION CONSULT NOTE - Initial Consult  Pharmacy Consult for Warfarin Indication: History of  pulmonary embolus  Allergies  Allergen Reactions  . Lactose Intolerance (Gi)     Patient Measurements: Height: '5\' 9"'$  (175.3 cm) Weight: 143 lb 11.8 oz (65.2 kg) IBW/kg (Calculated) : 70.7   Vital Signs: Temp: 99.4 F (37.4 C) (06/02 0538) Temp Source: Oral (06/02 0538) BP: 148/97 mmHg (06/02 0538) Pulse Rate: 94 (06/02 0538)  Labs:  Recent Labs  11/14/15 0208 11/14/15 0222 11/14/15 0446 11/15/15 0500  HGB 10.6* 10.9*  --  15.7  HCT 30.3* 32.0*  --  44.7  PLT 284  --   --  164  LABPROT  --   --  15.7* 15.9*  INR  --   --  1.28 1.30  CREATININE  --  0.60*  --  0.63    Estimated Creatinine Clearance: 94 mL/min (by C-G formula based on Cr of 0.63).   Medical History: Past Medical History  Diagnosis Date  . HIV infection ([place])     DR. [name]  . Hyperlipidemia   . Insomnia   . Lung nodule, multiple 10/24/13    CT CHEST W/O...DR. [name]  . Gynecomastia, male 10/16/13    MILD RIGHT SIDED PER RADIOLOGY STUDY  . COPD (chronic obstructive pulmonary disease) ([place])     PFT'S 10/26/13 @   . History of depression   . History of pneumonia   . Collapsed lung     d/t biopsy, right  . Radiation 06/05/14-06/20/14    whole brain 25 gray  . Encounter for antineoplastic chemotherapy 10/08/2015  . Small cell lung cancer ([facility])     Medications:  Prescriptions prior to admission  Medication Sig Dispense Refill Last Dose  . albuterol (PROAIR HFA) 108 (90 BASE) MCG/ACT inhaler Inhale 2 puffs into the lungs every 4 (four) hours as needed for wheezing or shortness of breath. Reported on 08/13/2015   Past Month at Unknown time  . ATRIPLA 600-200-300 MG tablet TAKE 1 TABLET BY MOUTH DAILY 30 tablet 2 Past Week at Unknown time  . doxylamine, Sleep, (UNISOM) 25 MG tablet Take 50 mg by mouth at bedtime as needed for sleep.    Past Week at Unknown time  . dronabinol  (MARINOL) 10 MG capsule TAKE ONE CAPSULE BY MOUTH TWICE DAILY BEFORE A MEAL 60 capsule 2 Past Week at Unknown time  . emollient (BIAFINE) cream Apply topically as needed. Reported on 08/13/2015   Past Month at Unknown time  . lidocaine-prilocaine (EMLA) cream Apply 1 application topically as needed. 30 g 4 11/07/2015 at Unknown time  . Melatonin 10 MG TABS Take 10 mg by mouth at bedtime as needed (sleep).   Past Week at Unknown time  . Multiple Vitamin (MULTIVITAMIN) tablet Take 1 tablet by mouth daily.   Past Week at Unknown time  . Nutritional Supplements (HIGH-PROTEIN NUTRITIONAL SHAKE) LIQD Take 1 each by mouth 2 (two) times daily between meals.   11/13/2015 at Unknown time  . oxyCODONE-acetaminophen (PERCOCET/ROXICET) 5-325 MG tablet Take 1 tablet by mouth every 6 (six) hours as needed for severe pain. 40 tablet 0 11/13/2015 at Unknown time  . prochlorperazine (COMPAZINE) 10 MG tablet Take 1 tablet (10 mg total) by mouth every 6 (six) hours as needed for nausea or vomiting. 30 tablet 1 11/13/2015 at Unknown time  . tolterodine (DETROL LA) 4 MG 24 hr capsule take 1 capsule daily  5 Past Week at Unknown time  . traZODone (DESYREL)  100 MG tablet Take 100 mg by mouth at bedtime.   Past Week at Unknown time  . varenicline (CHANTIX) 1 MG tablet Take 1 mg by mouth 2 (two) times daily. Reported on 08/13/2015   Past Week at Unknown time  . vitamin B-12 (CYANOCOBALAMIN) 500 MCG tablet Take 500 mcg by mouth daily.   Past Week at Unknown time  . warfarin (COUMADIN) 5 MG tablet Take 1 tablet (5 mg total) by mouth as directed. (Patient taking differently: Take 5 mg by mouth as directed. Take '5mg'$  on Monday and 2.'5mg'$  on other days) 30 tablet 0 11/11/2015 at Unknown time   Scheduled:  . azithromycin  500 mg Intravenous Q24H  . cefTRIAXone (ROCEPHIN)  IV  1 g Intravenous Q24H  . dronabinol  10 mg Oral BID  . efavirenz-emtricitabine-tenofovir  1 tablet Oral Daily  . feeding supplement (ENSURE ENLIVE)  237 mL Oral BID  BM  . fesoterodine  8 mg Oral Daily  . multivitamin with minerals  1 tablet Oral Daily  . traZODone  100 mg Oral QHS  . varenicline  1 mg Oral BID  . Warfarin - Pharmacist Dosing Inpatient   Does not apply q1800   Infusions:     Assessment: 65 yoM w/ hx of small cell carcinoma of lung on chronic warfarin for history of PE diagnosed 04/2014.  PTA dosing: Warfarin '5mg'$  Mon and 2.'5mg'$  other days.   INR 1.3 (subtherapeutic), Hgb 15.7, plt wnl  Goal of Therapy:  INR 2-3   Plan:   PT/INR now  Warfarin 7.5 mg x1  Daily PT/INR   Darl Pikes, PharmD Clinical Pharmacist- Resident Pager: [phone]  Darl Pikes 11/15/2015,7:50 AM

## 2015-11-15 NOTE — Telephone Encounter (Signed)
No need for new appointments

## 2015-11-15 NOTE — Progress Notes (Signed)
Discharge instructions reviewed with patient. Patient verbalized understanding. 

## 2015-11-15 NOTE — Discharge Summary (Signed)
Discharge Summary  Jaime Morris UMP:[id number] DOB: 25-Aug-1960  PCP: Irven Shelling, MD  Admit date: 11/14/2015 Discharge date: 11/15/2015  Time spent: <21mns  Recommendations for Outpatient Follow-up:  1. F/u with PMD within a week  for hospital discharge follow up, repeat cbc/bmp at follow up, pmd to monitor INR, pmd to follow up on final sputum culture result. 2. F/u with oncology  Discharge Diagnoses:  Active Hospital Problems   Diagnosis Date Noted  . CAP (community acquired pneumonia) 11/14/2015  . Right middle lobe pneumonia 04/03/2014  . [place]  . [place]) [place]    Resolved Hospital Problems   Diagnosis Date Noted Date Resolved  No resolved problems to display.    Discharge Condition: stable  Diet recommendation: regular diet  Filed Weights   11/14/15 0119 11/14/15 0533  Weight: 63.504 kg (140 lb) 65.2 kg (143 lb 11.8 oz)    History of present illness:  Jaime WALLMANis a 55y.o. 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 HIV on HAART. Patient presents to the ED with c/o fever and cough. Symptoms ongoing for the past 5 days, associated with headaches when running fever. Headaches have been intermittent. His last chemo dose was 7 days ago on the 25th. No known sick contacts.  ED Course: Patient found to have what appears to be PNA at R lung base. Tm 100.2 in ED, initially slightly tachycardic to 106 this improves with IVF and as his fever improves. WBC 9.4k so thankfully not neutropenic.  Hospital Course:  Principal Problem:   CAP (community acquired pneumonia) Active Problems:   [place] ([place]   [place] cell [place] ([facility]   Right middle lobe pneumonia  CAP - PNA  pathway Appears to be at "right base" on CXR, suspicious of RML however as he does have known chronic obstruction of the RML bronchus (see his CT on the 15th of last month), and previous PNA / post obstructive PNA in this lobe in the past (although last episode was apparently a year ago). rocephin and azithromycin Cultures pending fever and cough resolved, he is discharged on augmentin, and close pmd follow up  SCLC - Last chemo on the 25th Actually had appointment at cancer center for chemotherapy on the day being admitted to the hospital Left consult in computer system so he shows up on their list as part of our protocol when admitting a chemo patient. continue follow up with oncology.  HIV - Continue HAART, stable.  H/o PE: on coumadin, pmd to monitor INR  DVT prophylaxis: Continue coumadin Code Status: Full Family Communication: Family at bedside Consults called: None called, but left consult order in epic as per our routine when admitting a chemotherapy patient   Discharge Exam: BP 148/97 mmHg  Pulse 94  Temp(Src) 99.4 F (37.4 C) (Oral)  Resp 17  Ht '5\' 9"'$  (1.753 m)  Wt 65.2 kg (143 lb 11.8 oz)  BMI 21.22 kg/m2  SpO2 99%  General: aaox3 Cardiovascular: RRR Respiratory: CTABL  Discharge Instructions You were cared for by a hospitalist during your hospital stay. If you have any questions about your discharge medications or the care you received while you were in the hospital after you are discharged, you can call the unit and asked to speak with the hospitalist on call if the  hospitalist that took care of you is not available. Once you are discharged, your primary care physician will handle any further medical issues. Please note that NO REFILLS for any discharge medications will be authorized once you are discharged, as it is imperative that you return to your primary  care physician (or establish a relationship with a primary care physician if you do not have one) for your aftercare needs so that they can reassess your need for medications and monitor your lab values.      Discharge Instructions    Diet - low sodium heart healthy    Complete by:  As directed      Increase activity slowly    Complete by:  As directed             Medication List    TAKE these medications        amoxicillin-clavulanate 875-125 MG tablet  Commonly known as:  AUGMENTIN  Take 1 tablet by mouth 2 (two) times daily.     ATRIPLA 600-200-300 MG tablet  Generic drug:  efavirenz-emtricitabine-tenofovir  TAKE 1 TABLET BY MOUTH DAILY     doxylamine (Sleep) 25 MG tablet  Commonly known as:  UNISOM  Take 50 mg by mouth at bedtime as needed for sleep.     dronabinol 10 MG capsule  Commonly known as:  MARINOL  TAKE ONE CAPSULE BY MOUTH TWICE DAILY BEFORE A MEAL     emollient cream  Commonly known as:  BIAFINE  Apply topically as needed. Reported on 08/13/2015     HIGH-PROTEIN NUTRITIONAL SHAKE Liqd  Take 1 each by mouth 2 (two) times daily between meals.     lidocaine-prilocaine cream  Commonly known as:  EMLA  Apply 1 application topically as needed.     Melatonin 10 MG Tabs  Take 10 mg by mouth at bedtime as needed (sleep).     multivitamin tablet  Take 1 tablet by mouth daily.     oxyCODONE-acetaminophen 5-325 MG tablet  Commonly known as:  PERCOCET/ROXICET  Take 1 tablet by mouth every 6 (six) hours as needed for severe pain.     PROAIR HFA 108 (90 Base) MCG/ACT inhaler  Generic drug:  albuterol  Inhale 2 puffs into the lungs every 4 (four) hours as needed for wheezing or shortness of breath. Reported on 08/13/2015     prochlorperazine 10 MG tablet  Commonly known as:  COMPAZINE  Take 1 tablet (10 mg total) by mouth every 6 (six) hours as needed for nausea or vomiting.     tolterodine 4 MG 24 hr capsule  Commonly known as:  DETROL LA  take 1 capsule  daily     traZODone 100 MG tablet  Commonly known as:  DESYREL  Take 100 mg by mouth at bedtime.     varenicline 1 MG tablet  Commonly known as:  CHANTIX  Take 1 mg by mouth 2 (two) times daily. Reported on 08/13/2015     vitamin B-12 500 MCG tablet  Commonly known as:  CYANOCOBALAMIN  Take 500 mcg by mouth daily.     warfarin 5 MG tablet  Commonly known as:  COUMADIN  Take 1 tablet (5 mg total) by mouth as directed.       Allergies  Allergen Reactions  . Lactose Intolerance (Gi)    Follow-up Information    Follow up with Irven Shelling, MD In 1 week.   Specialty:  Internal Medicine   Why:  hosptial discharge follow up,  pmd to f/u on final sputum culture, pmd to monitor INR   Contact information:   [address] [place] [address] [phone]        The results of significant diagnostics from this hospitalization (including imaging, microbiology, ancillary and laboratory) are listed below for reference.    Significant Diagnostic Studies: Dg Chest 2 View  11/14/2015  CLINICAL DATA:  Acute onset of fever, cough and body aches. Patient on chemotherapy for lung cancer. Initial encounter. EXAM: CHEST  2 VIEW COMPARISON:  Chest radiograph performed 03/27/2014, and CT of the chest performed 10/28/2015 FINDINGS: The lungs are well-aerated. Peribronchial thickening is noted. Mild vascular congestion is seen. Minimal right basilar opacity raises concern for mild pneumonia. There is no evidence of pleural effusion or pneumothorax. The heart is normal in size; the mediastinal contour is within normal limits. A left-sided chest port is noted ending about the mid SVC. No acute osseous abnormalities are seen. IMPRESSION: 1. Minimal right basilar opacity raises concern for mild pneumonia. 2. Mild vascular congestion noted.  Peribronchial thickening seen. Electronically Signed   By: Garald Balding M.D.   On: 11/14/2015 03:12   Ct Head Wo Contrast  11/14/2015  CLINICAL  DATA:  Headaches. EXAM: CT HEAD WITHOUT CONTRAST TECHNIQUE: Contiguous axial images were obtained from the base of the skull through the vertex without intravenous contrast. COMPARISON:  MRI of September 20, 2015. FINDINGS: Bony calvarium appears intact. Mild chronic ischemic white matter disease is noted. Minimal diffuse cortical atrophy is noted. No mass effect or midline shift is noted. Ventricular size is within normal limits. There is no evidence of mass lesion, hemorrhage or acute infarction. IMPRESSION: Mild chronic ischemic white matter disease. Minimal diffuse cortical atrophy. No acute intracranial abnormality seen. Electronically Signed   By: Marijo Conception, M.D.   On: 11/14/2015 13:28   Ct Chest W Contrast  10/28/2015  CLINICAL DATA:  Patient with history of lung cancer diagnosed 11/2013. On chemotherapy. EXAM: CT CHEST, ABDOMEN, AND PELVIS WITH CONTRAST TECHNIQUE: Multidetector CT imaging of the chest, abdomen and pelvis was performed following the standard protocol during bolus administration of intravenous contrast. CONTRAST:  148m ISOVUE-300 IOPAMIDOL (ISOVUE-300) INJECTION 61% COMPARISON:  CT CAP 08/12/2015 FINDINGS: CT CHEST FINDINGS Mediastinum/Lymph Nodes: No axillary lymphadenopathy. Grossly unchanged mediastinal adenopathy including an 11 mm right peritracheal lymph node (image 25; series 2) and a 9 mm subcarinal lymph node (image 36; series 2). Stable heart size. Trace pericardial fluid. Coronary arterial vascular calcifications. Unchanged 10 mm right hilar lymph node (image 37; series 2). Unchanged right infrahilar lymph node measuring 11 mm (image 40; series 2). Lungs/Pleura: Re- demonstrated occlusion of the right middle lobe bronchus. There is associated right middle lobe atelectasis. Unchanged 8 mm peripheral nodule within the right middle lobe (image 90; series 4). Extensive centrilobular and paraseptal emphysematous change. Grossly unchanged bilateral 3 mm nodules. Additionally within  the right upper lobe (image 109; series 4) there is a new 9 mm nodule. No pleural effusion or pneumothorax. Musculoskeletal: Multiple new sclerotic lesions demonstrated throughout the thoracic spine including a 1.8 cm lesion within the T3 vertebral body, a 0.9 cm lesion within the T8 vertebral body and a 1.0 cm lesion within the T9 vertebral body. CT ABDOMEN PELVIS FINDINGS Hepatobiliary: Interval development of a 1.1 cm low-attenuation lesion within the central liver (image 61; series 2). Interval increase in size of low-attenuation lesion within the peripheral left hepatic lobe (image 59; series 2) measuring 1.1 x 1.6 cm,  previously 1.2 x 0.9 cm. This interval development of a 1.0 cm lesion within the inferior right hepatic lobe (image 83; series 2). There is an additional adjacent new 1.1 cm lesion (image 80; series 2). Slight interval increase in size of subcapsular lesion within the posterior right hepatic lobe measuring 1.0 x 0.5 cm (image 72; series 2), previous measuring 0.8 x 0.7 cm. Additional new 0.5 cm lesion near the hepatic dome (image 58; series 2). Gallbladder is unremarkable. Pancreas: Unremarkable Spleen: Unremarkable Adrenals/Urinary Tract: Interval increase in size of right adrenal mass measuring 1.6 x 2.5 cm (image 68; series 2), previously measuring 1.3 x 2.1 cm. Grossly unchanged left adrenal nodule measuring 1.1 x 0.9 cm (image 68; series 2), previously 1.1 x 0.9 cm. Kidneys enhance symmetrically with contrast. Unchanged too small to characterize low-attenuation lesions. No hydronephrosis. Urinary bladder is unremarkable. Stomach/Bowel: The appendix is normal. No abnormal bowel wall thickening or evidence for bowel obstruction. No free fluid or free intraperitoneal air. Vascular/Lymphatic: Normal caliber abdominal aorta. Peripheral calcified atherosclerotic plaque. No retroperitoneal lymphadenopathy. Other: Prostate is unremarkable. Musculoskeletal: Interval increase in size of S1 lytic  lesion measuring 3.5 x 2.6 cm (image 100; series 2), previously measuring 2.5 x 1.7 cm. Re- demonstrated L4-S1 spinal fusion. Interval development of a 2.5 cm sclerotic lesion within the L1 vertebral body. IMPRESSION: Interval development of new and enlargement of additional low-attenuation liver lesions compatible with metastatic disease. Interval enlargement of previously identified sacral lytic lesion. Multiple new osseous metastatic lesions are demonstrated. Grossly unchanged mediastinal and right hilar adenopathy. Interval development of a new 9 mm nodule within the right upper lobe which is nonspecific and may represent metastasis or a focal infectious/ inflammatory process. Additional smaller nodules within the lungs bilaterally are stable. Interval enlargement of right adrenal mass. Electronically Signed   By: Lovey Newcomer M.D.   On: 10/28/2015 09:36   Ct Abdomen Pelvis W Contrast  10/28/2015  CLINICAL DATA:  Patient with history of lung cancer diagnosed 11/2013. On chemotherapy. EXAM: CT CHEST, ABDOMEN, AND PELVIS WITH CONTRAST TECHNIQUE: Multidetector CT imaging of the chest, abdomen and pelvis was performed following the standard protocol during bolus administration of intravenous contrast. CONTRAST:  124m ISOVUE-300 IOPAMIDOL (ISOVUE-300) INJECTION 61% COMPARISON:  CT CAP 08/12/2015 FINDINGS: CT CHEST FINDINGS Mediastinum/Lymph Nodes: No axillary lymphadenopathy. Grossly unchanged mediastinal adenopathy including an 11 mm right peritracheal lymph node (image 25; series 2) and a 9 mm subcarinal lymph node (image 36; series 2). Stable heart size. Trace pericardial fluid. Coronary arterial vascular calcifications. Unchanged 10 mm right hilar lymph node (image 37; series 2). Unchanged right infrahilar lymph node measuring 11 mm (image 40; series 2). Lungs/Pleura: Re- demonstrated occlusion of the right middle lobe bronchus. There is associated right middle lobe atelectasis. Unchanged 8 mm peripheral  nodule within the right middle lobe (image 90; series 4). Extensive centrilobular and paraseptal emphysematous change. Grossly unchanged bilateral 3 mm nodules. Additionally within the right upper lobe (image 109; series 4) there is a new 9 mm nodule. No pleural effusion or pneumothorax. Musculoskeletal: Multiple new sclerotic lesions demonstrated throughout the thoracic spine including a 1.8 cm lesion within the T3 vertebral body, a 0.9 cm lesion within the T8 vertebral body and a 1.0 cm lesion within the T9 vertebral body. CT ABDOMEN PELVIS FINDINGS Hepatobiliary: Interval development of a 1.1 cm low-attenuation lesion within the central liver (image 61; series 2). Interval increase in size of low-attenuation lesion within the peripheral left hepatic lobe (image 59; series 2) measuring  1.1 x 1.6 cm, previously 1.2 x 0.9 cm. This interval development of a 1.0 cm lesion within the inferior right hepatic lobe (image 83; series 2). There is an additional adjacent new 1.1 cm lesion (image 80; series 2). Slight interval increase in size of subcapsular lesion within the posterior right hepatic lobe measuring 1.0 x 0.5 cm (image 72; series 2), previous measuring 0.8 x 0.7 cm. Additional new 0.5 cm lesion near the hepatic dome (image 58; series 2). Gallbladder is unremarkable. Pancreas: Unremarkable Spleen: Unremarkable Adrenals/Urinary Tract: Interval increase in size of right adrenal mass measuring 1.6 x 2.5 cm (image 68; series 2), previously measuring 1.3 x 2.1 cm. Grossly unchanged left adrenal nodule measuring 1.1 x 0.9 cm (image 68; series 2), previously 1.1 x 0.9 cm. Kidneys enhance symmetrically with contrast. Unchanged too small to characterize low-attenuation lesions. No hydronephrosis. Urinary bladder is unremarkable. Stomach/Bowel: The appendix is normal. No abnormal bowel wall thickening or evidence for bowel obstruction. No free fluid or free intraperitoneal air. Vascular/Lymphatic: Normal caliber abdominal  aorta. Peripheral calcified atherosclerotic plaque. No retroperitoneal lymphadenopathy. Other: Prostate is unremarkable. Musculoskeletal: Interval increase in size of S1 lytic lesion measuring 3.5 x 2.6 cm (image 100; series 2), previously measuring 2.5 x 1.7 cm. Re- demonstrated L4-S1 spinal fusion. Interval development of a 2.5 cm sclerotic lesion within the L1 vertebral body. IMPRESSION: Interval development of new and enlargement of additional low-attenuation liver lesions compatible with metastatic disease. Interval enlargement of previously identified sacral lytic lesion. Multiple new osseous metastatic lesions are demonstrated. Grossly unchanged mediastinal and right hilar adenopathy. Interval development of a new 9 mm nodule within the right upper lobe which is nonspecific and may represent metastasis or a focal infectious/ inflammatory process. Additional smaller nodules within the lungs bilaterally are stable. Interval enlargement of right adrenal mass. Electronically Signed   By: Lovey Newcomer M.D.   On: 10/28/2015 09:36    Microbiology: Recent Results (from the past 240 hour(s))  Urine culture     Status: None   Collection Time: 11/14/15  1:54 AM  Result Value Ref Range Status   Specimen Description URINE, CLEAN CATCH  Final   Special Requests Normal  Final   Culture NO GROWTH Performed at [facility]   Final   Report Status 11/15/2015 FINAL  Final  Blood culture (routine x 2)     Status: None (Preliminary result)   Collection Time: 11/14/15  2:09 AM  Result Value Ref Range Status   Specimen Description BLOOD PORT A CATH  Final   Special Requests BOTTLES DRAWN AEROBIC AND ANAEROBIC 5 CC EA  Final   Culture   Final    NO GROWTH 1 DAY Performed at [facility]    Report Status PENDING  Incomplete  Blood culture (routine x 2)     Status: None (Preliminary result)   Collection Time: 11/14/15  3:59 AM  Result Value Ref Range Status   Specimen Description BLOOD RIGHT  ANTECUBITAL  Final   Special Requests BOTTLES DRAWN AEROBIC AND ANAEROBIC 5ML  Final   Culture   Final    NO GROWTH 1 DAY Performed at [facility]    Report Status PENDING  Incomplete  Culture, sputum-assessment     Status: None   Collection Time: 11/14/15 10:10 AM  Result Value Ref Range Status   Specimen Description SPUTUM  Final   Special Requests NONE  Final   Sputum evaluation   Final    THIS SPECIMEN IS ACCEPTABLE.  RESPIRATORY CULTURE REPORT TO FOLLOW.   Report Status 11/14/2015 FINAL  Final  Culture, respiratory (NON-Expectorated)     Status: None (Preliminary result)   Collection Time: 11/14/15 10:10 AM  Result Value Ref Range Status   Specimen Description SPUTUM  Final   Special Requests NONE  Final   Gram Stain   Final    MODERATE SQUAMOUS EPITHELIAL CELLS PRESENT FEW WBC PRESENT,BOTH PMN AND MONONUCLEAR MODERATE GRAM POSITIVE COCCI IN PAIRS FEW GRAM VARIABLE ROD    Culture   Final    CULTURE REINCUBATED FOR BETTER GROWTH Performed at [facility]    Report Status PENDING  Incomplete  MRSA PCR Screening     Status: None   Collection Time: 11/14/15 12:40 PM  Result Value Ref Range Status   MRSA by PCR NEGATIVE NEGATIVE Final    Comment:        The GeneXpert MRSA Assay (FDA approved for NASAL specimens only), is one component of a comprehensive MRSA colonization surveillance program. It is not intended to diagnose MRSA infection nor to guide or monitor treatment for MRSA infections.      Labs: Basic Metabolic Panel:  Recent Labs Lab 11/14/15 0222 11/15/15 0500  NA 134* 139  K 4.1 3.4*  CL 96* 106  CO2  --  24  GLUCOSE 106* 88  BUN <3* <5*  CREATININE 0.60* 0.63  CALCIUM  --  8.7*  MG  --  2.2   Liver Function Tests: No results for input(s): AST, ALT, ALKPHOS, BILITOT, PROT, ALBUMIN in the last 168 hours. No results for input(s): LIPASE, AMYLASE in the last 168 hours. No results for input(s): AMMONIA in the last 168  hours. CBC:  Recent Labs Lab 11/14/15 0208 11/14/15 0222 11/15/15 0500  WBC 9.4  --  2.9*  NEUTROABS 7.0  --   --   HGB 10.6* 10.9* 15.7  HCT 30.3* 32.0* 44.7  MCV 94.4  --  94.3  PLT 284  --  164   Cardiac Enzymes: No results for input(s): CKTOTAL, CKMB, CKMBINDEX, TROPONINI in the last 168 hours. BNP: BNP (last 3 results) No results for input(s): BNP in the last 8760 hours.  ProBNP (last 3 results) No results for input(s): PROBNP in the last 8760 hours.  CBG: No results for input(s): GLUCAP in the last 168 hours.     SignedFlorencia Reasons MD, PhD  [facility] 11/15/2015, 8:07 PM

## 2015-11-15 NOTE — Telephone Encounter (Signed)
Pt just discharged from hospital . Does he need new appt . I told pt to keep scheduled appt for June 13

## 2015-11-16 LAB — CULTURE, RESPIRATORY W GRAM STAIN: Culture: NORMAL

## 2015-11-16 LAB — CULTURE, RESPIRATORY

## 2015-11-19 LAB — CULTURE, BLOOD (ROUTINE X 2)
Culture: NO GROWTH
Culture: NO GROWTH

## 2015-11-20 ENCOUNTER — Encounter: Payer: Self-pay | Admitting: Radiation Oncology

## 2015-11-20 ENCOUNTER — Other Ambulatory Visit: Payer: BLUE CROSS/BLUE SHIELD

## 2015-11-20 ENCOUNTER — Ambulatory Visit
Admission: RE | Admit: 2015-11-20 | Discharge: 2015-11-20 | Disposition: A | Discharge status: Home / Self Care | Payer: BLUE CROSS/BLUE SHIELD | Source: Ambulatory Visit | Attending: Radiation Oncology | Admitting: Radiation Oncology

## 2015-11-20 VITALS — BP 132/82 | HR 89 | Temp 97.4°F | Ht 69.0 in | Wt 142.9 lb

## 2015-11-20 DIAGNOSIS — Z510 Encounter for antineoplastic radiation therapy: Secondary | ICD-10-CM | POA: Diagnosis not present

## 2015-11-20 DIAGNOSIS — Z79899 Other long term (current) drug therapy: Secondary | ICD-10-CM

## 2015-11-20 DIAGNOSIS — M899 Disorder of bone, unspecified: Secondary | ICD-10-CM | POA: Insufficient documentation

## 2015-11-20 DIAGNOSIS — C7951 Secondary malignant neoplasm of bone: Secondary | ICD-10-CM

## 2015-11-20 DIAGNOSIS — C3490 Malignant neoplasm of unspecified part of unspecified bronchus or lung: Secondary | ICD-10-CM

## 2015-11-20 DIAGNOSIS — Z113 Encounter for screening for infections with a predominantly sexual mode of transmission: Secondary | ICD-10-CM

## 2015-11-20 DIAGNOSIS — B20 Human immunodeficiency virus [HIV] disease: Secondary | ICD-10-CM

## 2015-11-20 LAB — LIPID PANEL
CHOL/HDL RATIO: 2.2 ratio (ref <=5.0)
Cholesterol: 151 mg/dL (ref 125–200)
HDL: 68 mg/dL (ref >=40)
LDL Cholesterol: 56 mg/dL (ref <130)
Triglycerides: 136 mg/dL (ref <150)
VLDL: 27 mg/dL (ref <30)

## 2015-11-20 NOTE — Addendum Note (Signed)
Encounter addended by: Benn Moulder, RN on: 11/20/2015  6:13 PM<BR>     Documentation filed: BPA Follow-up Actions, Flowsheet VN, Dx Association, Orders

## 2015-11-20 NOTE — Progress Notes (Signed)
Radiation Oncology         [phone]) [phone] ________________________________  Name: Jaime Morris MRN: [id number]  Date: 11/20/2015  DOB: [birth year]  Follow-Up Visit Note  CC: Irven Shelling, MD  Curt Bears, MD  Diagnosis: Extensive stage small cell lung cancer,  progressive spine lesions  Interval Since Last Radiation: 1 year, 5 months. Completed radiation 06/05/2014 - 06/20/2014 to the whole brain treated to 25 Gy in 10 fractions.  Narrative:  The patient returns today for follow up due to recent CT scan. He had a CT of the abdomen and pelvis 10/28/2015 revealing an S1 lytic lesion measuring 3.5 x 2.6 cm. It also showed multiple new sclerotic lesion throughout the thoracic spine including a 1.8 cm lesion within the T3 vertebral body, a 0.9 cm lesion within the T8 vertebral body, and a 1.0 cm lesion within the T9 vertebral body. He mentions the pain started on his hip. He believes it is affecting the way it walks. He mentions that he feels weakness in his legs, mostly his left leg. He has intermittent abdominal cramping he rates at a 9 out of 10 that happens several times a day and lasts about 3 to 4 minutes. He mentions he had diarrhea and loose stools/soft daily three times a day. He mentions he has some numbness in his fingers. He is here today to discuss radiation treatment options.  He is going to talk to Dr. Julien Nordmann when he has his appointment with him 11/26/2015 to see if he can finish his chemotherapy in [place].  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  Meds: Current Outpatient Prescriptions  Medication Sig Dispense Refill  . albuterol (PROAIR HFA) 108 (90 BASE) MCG/ACT inhaler Inhale 2 puffs into the lungs every 4 (four) hours as needed for wheezing or shortness of breath. Reported on 08/13/2015    . amoxicillin-clavulanate (AUGMENTIN) 875-125 MG tablet Take 1 tablet by mouth 2 (two) times daily. 6 tablet 0  . ATRIPLA 600-200-300 MG tablet TAKE 1 TABLET BY  MOUTH DAILY 30 tablet 2  . doxylamine, Sleep, (UNISOM) 25 MG tablet Take 50 mg by mouth at bedtime as needed for sleep.     Marland Kitchen dronabinol (MARINOL) 10 MG capsule TAKE ONE CAPSULE BY MOUTH TWICE DAILY BEFORE A MEAL 60 capsule 2  . emollient (BIAFINE) cream Apply topically as needed. Reported on 08/13/2015    . lidocaine-prilocaine (EMLA) cream Apply 1 application topically as needed. 30 g 4  . Melatonin 10 MG TABS Take 10 mg by mouth at bedtime as needed (sleep).    . Multiple Vitamin (MULTIVITAMIN) tablet Take 1 tablet by mouth daily.    . Nutritional Supplements (HIGH-PROTEIN NUTRITIONAL SHAKE) LIQD Take 1 each by mouth 2 (two) times daily between meals.    Marland Kitchen oxyCODONE-acetaminophen (PERCOCET/ROXICET) 5-325 MG tablet Take 1 tablet by mouth every 6 (six) hours as needed for severe pain. 40 tablet 0  . prochlorperazine (COMPAZINE) 10 MG tablet Take 1 tablet (10 mg total) by mouth every 6 (six) hours as needed for nausea or vomiting. 30 tablet 1  . tolterodine (DETROL LA) 4 MG 24 hr capsule take 1 capsule daily  5  . traZODone (DESYREL) 100 MG tablet Take 100 mg by mouth at bedtime.    . varenicline (CHANTIX) 1 MG tablet Take 1 mg by mouth 2 (two) times daily. Reported on 08/13/2015    . vitamin B-12 (CYANOCOBALAMIN) 500 MCG tablet Take 500 mcg by mouth daily.    Marland Kitchen warfarin (  COUMADIN) 5 MG tablet Take 1 tablet (5 mg total) by mouth as directed. (Patient taking differently: Take 5 mg by mouth as directed. Take '5mg'$  on Monday and 2.'5mg'$  on other days) 30 tablet 0   No current facility-administered medications for this encounter.   Facility-Administered Medications Ordered in Other Encounters  Medication Dose Route Frequency Provider Last Rate Last Dose  . sodium chloride 0.9 % injection 10 mL  10 mL Intracatheter PRN Curt Bears, MD   10 mL at 01/30/14 1451    Physical Findings: The patient is in no acute distress. Patient is alert and oriented.  height is '5\' 9"'$  (1.753 m) and weight is 142 lb  14.4 oz (64.819 kg). His temperature is 97.4 F (36.3 C). His blood pressure is 132/82 and his pulse is 89. His oxygen saturation is 100%. . No significant changes.  Lungs are clear. Heart has regular rate and rhythm. No palpable cervical, supraclavicular, or axillary adenopathy. Neurological examination nonfocal.   Lab Findings: Lab Results  Component Value Date   WBC 2.9* 11/15/2015   HGB 15.7 11/15/2015   HCT 44.7 11/15/2015   MCV 94.3 11/15/2015   PLT 164 11/15/2015    Radiographic Findings: Dg Chest 2 View  11/14/2015  CLINICAL DATA:  Acute onset of fever, cough and body aches. Patient on chemotherapy for lung cancer. Initial encounter. EXAM: CHEST  2 VIEW COMPARISON:  Chest radiograph performed 03/27/2014, and CT of the chest performed 10/28/2015 FINDINGS: The lungs are well-aerated. Peribronchial thickening is noted. Mild vascular congestion is seen. Minimal right basilar opacity raises concern for mild pneumonia. There is no evidence of pleural effusion or pneumothorax. The heart is normal in size; the mediastinal contour is within normal limits. A left-sided chest port is noted ending about the mid SVC. No acute osseous abnormalities are seen. IMPRESSION: 1. Minimal right basilar opacity raises concern for mild pneumonia. 2. Mild vascular congestion noted.  Peribronchial thickening seen. Electronically Signed   By: Garald Balding M.D.   On: 11/14/2015 03:12   Ct Head Wo Contrast  11/14/2015  CLINICAL DATA:  Headaches. EXAM: CT HEAD WITHOUT CONTRAST TECHNIQUE: Contiguous axial images were obtained from the base of the skull through the vertex without intravenous contrast. COMPARISON:  MRI of September 20, 2015. FINDINGS: Bony calvarium appears intact. Mild chronic ischemic white matter disease is noted. Minimal diffuse cortical atrophy is noted. No mass effect or midline shift is noted. Ventricular size is within normal limits. There is no evidence of mass lesion, hemorrhage or acute  infarction. IMPRESSION: Mild chronic ischemic white matter disease. Minimal diffuse cortical atrophy. No acute intracranial abnormality seen. Electronically Signed   By: Marijo Conception, M.D.   On: 11/14/2015 13:28   Ct Chest W Contrast  10/28/2015  CLINICAL DATA:  Patient with history of lung cancer diagnosed 11/2013. On chemotherapy. EXAM: CT CHEST, ABDOMEN, AND PELVIS WITH CONTRAST TECHNIQUE: Multidetector CT imaging of the chest, abdomen and pelvis was performed following the standard protocol during bolus administration of intravenous contrast. CONTRAST:  163m ISOVUE-300 IOPAMIDOL (ISOVUE-300) INJECTION 61% COMPARISON:  CT CAP 08/12/2015 FINDINGS: CT CHEST FINDINGS Mediastinum/Lymph Nodes: No axillary lymphadenopathy. Grossly unchanged mediastinal adenopathy including an 11 mm right peritracheal lymph node (image 25; series 2) and a 9 mm subcarinal lymph node (image 36; series 2). Stable heart size. Trace pericardial fluid. Coronary arterial vascular calcifications. Unchanged 10 mm right hilar lymph node (image 37; series 2). Unchanged right infrahilar lymph node measuring 11 mm (image 40; series 2).  Lungs/Pleura: Re- demonstrated occlusion of the right middle lobe bronchus. There is associated right middle lobe atelectasis. Unchanged 8 mm peripheral nodule within the right middle lobe (image 90; series 4). Extensive centrilobular and paraseptal emphysematous change. Grossly unchanged bilateral 3 mm nodules. Additionally within the right upper lobe (image 109; series 4) there is a new 9 mm nodule. No pleural effusion or pneumothorax. Musculoskeletal: Multiple new sclerotic lesions demonstrated throughout the thoracic spine including a 1.8 cm lesion within the T3 vertebral body, a 0.9 cm lesion within the T8 vertebral body and a 1.0 cm lesion within the T9 vertebral body. CT ABDOMEN PELVIS FINDINGS Hepatobiliary: Interval development of a 1.1 cm low-attenuation lesion within the central liver (image 61;  series 2). Interval increase in size of low-attenuation lesion within the peripheral left hepatic lobe (image 59; series 2) measuring 1.1 x 1.6 cm, previously 1.2 x 0.9 cm. This interval development of a 1.0 cm lesion within the inferior right hepatic lobe (image 83; series 2). There is an additional adjacent new 1.1 cm lesion (image 80; series 2). Slight interval increase in size of subcapsular lesion within the posterior right hepatic lobe measuring 1.0 x 0.5 cm (image 72; series 2), previous measuring 0.8 x 0.7 cm. Additional new 0.5 cm lesion near the hepatic dome (image 58; series 2). Gallbladder is unremarkable. Pancreas: Unremarkable Spleen: Unremarkable Adrenals/Urinary Tract: Interval increase in size of right adrenal mass measuring 1.6 x 2.5 cm (image 68; series 2), previously measuring 1.3 x 2.1 cm. Grossly unchanged left adrenal nodule measuring 1.1 x 0.9 cm (image 68; series 2), previously 1.1 x 0.9 cm. Kidneys enhance symmetrically with contrast. Unchanged too small to characterize low-attenuation lesions. No hydronephrosis. Urinary bladder is unremarkable. Stomach/Bowel: The appendix is normal. No abnormal bowel wall thickening or evidence for bowel obstruction. No free fluid or free intraperitoneal air. Vascular/Lymphatic: Normal caliber abdominal aorta. Peripheral calcified atherosclerotic plaque. No retroperitoneal lymphadenopathy. Other: Prostate is unremarkable. Musculoskeletal: Interval increase in size of S1 lytic lesion measuring 3.5 x 2.6 cm (image 100; series 2), previously measuring 2.5 x 1.7 cm. Re- demonstrated L4-S1 spinal fusion. Interval development of a 2.5 cm sclerotic lesion within the L1 vertebral body. IMPRESSION: Interval development of new and enlargement of additional low-attenuation liver lesions compatible with metastatic disease. Interval enlargement of previously identified sacral lytic lesion. Multiple new osseous metastatic lesions are demonstrated. Grossly unchanged  mediastinal and right hilar adenopathy. Interval development of a new 9 mm nodule within the right upper lobe which is nonspecific and may represent metastasis or a focal infectious/ inflammatory process. Additional smaller nodules within the lungs bilaterally are stable. Interval enlargement of right adrenal mass. Electronically Signed   By: Lovey Newcomer M.D.   On: 10/28/2015 09:36   Ct Abdomen Pelvis W Contrast  10/28/2015  CLINICAL DATA:  Patient with history of lung cancer diagnosed 11/2013. On chemotherapy. EXAM: CT CHEST, ABDOMEN, AND PELVIS WITH CONTRAST TECHNIQUE: Multidetector CT imaging of the chest, abdomen and pelvis was performed following the standard protocol during bolus administration of intravenous contrast. CONTRAST:  132m ISOVUE-300 IOPAMIDOL (ISOVUE-300) INJECTION 61% COMPARISON:  CT CAP 08/12/2015 FINDINGS: CT CHEST FINDINGS Mediastinum/Lymph Nodes: No axillary lymphadenopathy. Grossly unchanged mediastinal adenopathy including an 11 mm right peritracheal lymph node (image 25; series 2) and a 9 mm subcarinal lymph node (image 36; series 2). Stable heart size. Trace pericardial fluid. Coronary arterial vascular calcifications. Unchanged 10 mm right hilar lymph node (image 37; series 2). Unchanged right infrahilar lymph node measuring 11 mm (  image 40; series 2). Lungs/Pleura: Re- demonstrated occlusion of the right middle lobe bronchus. There is associated right middle lobe atelectasis. Unchanged 8 mm peripheral nodule within the right middle lobe (image 90; series 4). Extensive centrilobular and paraseptal emphysematous change. Grossly unchanged bilateral 3 mm nodules. Additionally within the right upper lobe (image 109; series 4) there is a new 9 mm nodule. No pleural effusion or pneumothorax. Musculoskeletal: Multiple new sclerotic lesions demonstrated throughout the thoracic spine including a 1.8 cm lesion within the T3 vertebral body, a 0.9 cm lesion within the T8 vertebral body and a 1.0  cm lesion within the T9 vertebral body. CT ABDOMEN PELVIS FINDINGS Hepatobiliary: Interval development of a 1.1 cm low-attenuation lesion within the central liver (image 61; series 2). Interval increase in size of low-attenuation lesion within the peripheral left hepatic lobe (image 59; series 2) measuring 1.1 x 1.6 cm, previously 1.2 x 0.9 cm. This interval development of a 1.0 cm lesion within the inferior right hepatic lobe (image 83; series 2). There is an additional adjacent new 1.1 cm lesion (image 80; series 2). Slight interval increase in size of subcapsular lesion within the posterior right hepatic lobe measuring 1.0 x 0.5 cm (image 72; series 2), previous measuring 0.8 x 0.7 cm. Additional new 0.5 cm lesion near the hepatic dome (image 58; series 2). Gallbladder is unremarkable. Pancreas: Unremarkable Spleen: Unremarkable Adrenals/Urinary Tract: Interval increase in size of right adrenal mass measuring 1.6 x 2.5 cm (image 68; series 2), previously measuring 1.3 x 2.1 cm. Grossly unchanged left adrenal nodule measuring 1.1 x 0.9 cm (image 68; series 2), previously 1.1 x 0.9 cm. Kidneys enhance symmetrically with contrast. Unchanged too small to characterize low-attenuation lesions. No hydronephrosis. Urinary bladder is unremarkable. Stomach/Bowel: The appendix is normal. No abnormal bowel wall thickening or evidence for bowel obstruction. No free fluid or free intraperitoneal air. Vascular/Lymphatic: Normal caliber abdominal aorta. Peripheral calcified atherosclerotic plaque. No retroperitoneal lymphadenopathy. Other: Prostate is unremarkable. Musculoskeletal: Interval increase in size of S1 lytic lesion measuring 3.5 x 2.6 cm (image 100; series 2), previously measuring 2.5 x 1.7 cm. Re- demonstrated L4-S1 spinal fusion. Interval development of a 2.5 cm sclerotic lesion within the L1 vertebral body. IMPRESSION: Interval development of new and enlargement of additional low-attenuation liver lesions  compatible with metastatic disease. Interval enlargement of previously identified sacral lytic lesion. Multiple new osseous metastatic lesions are demonstrated. Grossly unchanged mediastinal and right hilar adenopathy. Interval development of a new 9 mm nodule within the right upper lobe which is nonspecific and may represent metastasis or a focal infectious/ inflammatory process. Additional smaller nodules within the lungs bilaterally are stable. Interval enlargement of right adrenal mass. Electronically Signed   By: Lovey Newcomer M.D.   On: 10/28/2015 09:36   Impression:  Jaime Morris is a 55 year old male presenting to clinic with extensive stage small cell lung cancer, status post prophylactic cranial irradiation with new lesions in his sacrum causing pain. He is a good candidate for palliative radiation therapy to his sacrum due to his progressive lytic lesion and associated pain.  Plan:  We discussed the role of radiation and its side effects. We discussed that he should have Imodium around the house if he gets diarrhea from the radiation. We discussed that he would need 10 treatments, 15 at the most. His CT simulation appointment is scheduled tomorrow at 10 am. This procedure has been fully reviewed with the patient and written informed consent has been obtained. He will start  treatment next week. ____________________________________   Blair Promise, PhD, MD    This document serves as a record of services personally performed by Gery Pray, MD. It was created on his behalf by Lendon Collar, a trained medical scribe. The creation of this record is based on the scribe's personal observations and the provider's statements to them. This document has been checked and approved by the attending provider.

## 2015-11-21 ENCOUNTER — Ambulatory Visit
Admission: RE | Admit: 2015-11-21 | Discharge: 2015-11-21 | Disposition: A | Discharge status: Home / Self Care | Payer: BLUE CROSS/BLUE SHIELD | Source: Ambulatory Visit | Attending: Radiation Oncology | Admitting: Radiation Oncology

## 2015-11-21 DIAGNOSIS — Z510 Encounter for antineoplastic radiation therapy: Secondary | ICD-10-CM | POA: Diagnosis not present

## 2015-11-21 DIAGNOSIS — C3490 Malignant neoplasm of unspecified part of unspecified bronchus or lung: Secondary | ICD-10-CM

## 2015-11-21 LAB — T-HELPER CELL (CD4) - (RCID CLINIC ONLY)
CD4 % Helper T Cell: 36 % (ref 33–55)
CD4 T Cell Abs: 530 /uL (ref 400–2700)

## 2015-11-21 LAB — URINE CYTOLOGY ANCILLARY ONLY
Chlamydia: NEGATIVE
Neisseria Gonorrhea: NEGATIVE

## 2015-11-21 LAB — SYPHILIS: RPR W/REFLEX TO RPR TITER AND TREPONEMAL ANTIBODIES, TRADITIONAL SCREENING AND DIAGNOSIS ALGORITHM

## 2015-11-21 LAB — HIV-1 RNA QUANT-NO REFLEX-BLD

## 2015-11-26 ENCOUNTER — Ambulatory Visit (HOSPITAL_BASED_OUTPATIENT_CLINIC_OR_DEPARTMENT_OTHER): Payer: BLUE CROSS/BLUE SHIELD | Admitting: Internal Medicine

## 2015-11-26 ENCOUNTER — Telehealth: Payer: Self-pay | Admitting: Internal Medicine

## 2015-11-26 ENCOUNTER — Ambulatory Visit
Admission: RE | Admit: 2015-11-26 | Payer: BLUE CROSS/BLUE SHIELD | Source: Ambulatory Visit | Admitting: Radiation Oncology

## 2015-11-26 ENCOUNTER — Encounter: Payer: Self-pay | Admitting: Internal Medicine

## 2015-11-26 ENCOUNTER — Ambulatory Visit
Admission: RE | Admit: 2015-11-26 | Discharge: 2015-11-26 | Disposition: A | Discharge status: Home / Self Care | Payer: BLUE CROSS/BLUE SHIELD | Source: Ambulatory Visit | Attending: Radiation Oncology | Admitting: Radiation Oncology

## 2015-11-26 ENCOUNTER — Other Ambulatory Visit (HOSPITAL_BASED_OUTPATIENT_CLINIC_OR_DEPARTMENT_OTHER): Payer: BLUE CROSS/BLUE SHIELD

## 2015-11-26 VITALS — BP 161/97 | HR 110 | Temp 98.0°F | Resp 18 | Ht 69.0 in | Wt 139.4 lb

## 2015-11-26 DIAGNOSIS — C3490 Malignant neoplasm of unspecified part of unspecified bronchus or lung: Secondary | ICD-10-CM

## 2015-11-26 DIAGNOSIS — C7951 Secondary malignant neoplasm of bone: Secondary | ICD-10-CM

## 2015-11-26 DIAGNOSIS — Z5111 Encounter for antineoplastic chemotherapy: Secondary | ICD-10-CM

## 2015-11-26 DIAGNOSIS — I2699 Other pulmonary embolism without acute cor pulmonale: Secondary | ICD-10-CM | POA: Diagnosis not present

## 2015-11-26 DIAGNOSIS — R634 Abnormal weight loss: Secondary | ICD-10-CM

## 2015-11-26 DIAGNOSIS — C3491 Malignant neoplasm of unspecified part of right bronchus or lung: Secondary | ICD-10-CM

## 2015-11-26 DIAGNOSIS — Z510 Encounter for antineoplastic radiation therapy: Secondary | ICD-10-CM | POA: Diagnosis not present

## 2015-11-26 LAB — CBC WITH DIFFERENTIAL/PLATELET
BASO%: 0.8 % (ref 0.0–2.0)
Basophils Absolute: 0 10*3/uL (ref 0.0–0.1)
EOS ABS: 0.1 10*3/uL (ref 0.0–0.5)
EOS%: 2.1 % (ref 0.0–7.0)
HEMATOCRIT: 37.2 % — AB (ref 38.4–49.9)
HEMOGLOBIN: 12.4 g/dL — AB (ref 13.0–17.1)
LYMPH#: 1.3 10*3/uL (ref 0.9–3.3)
LYMPH%: 25.7 % (ref 14.0–49.0)
MCH: 32.8 pg (ref 27.2–33.4)
MCHC: 33.3 g/dL (ref 32.0–36.0)
MCV: 98.4 fL — ABNORMAL HIGH (ref 79.3–98.0)
MONO#: 0.5 10*3/uL (ref 0.1–0.9)
MONO%: 10 % (ref 0.0–14.0)
NEUT%: 61.4 % (ref 39.0–75.0)
NEUTROS ABS: 3.2 10*3/uL (ref 1.5–6.5)
PLATELETS: 193 10*3/uL (ref 140–400)
RBC: 3.78 10*6/uL — ABNORMAL LOW (ref 4.20–5.82)
RDW: 15.4 % — AB (ref 11.0–14.6)
WBC: 5.2 10*3/uL (ref 4.0–10.3)

## 2015-11-26 LAB — COMPREHENSIVE METABOLIC PANEL
ALBUMIN: 3.7 g/dL (ref 3.5–5.0)
ALK PHOS: 89 U/L (ref 40–150)
ALT: 13 U/L (ref 0–55)
ANION GAP: 8 meq/L (ref 3–11)
AST: 18 U/L (ref 5–34)
CALCIUM: 9.8 mg/dL (ref 8.4–10.4)
CHLORIDE: 102 meq/L (ref 98–109)
CO2: 27 mEq/L (ref 22–29)
CREATININE: 0.8 mg/dL (ref 0.7–1.3)
EGFR: >90 mL/min/{1.73_m2} (ref >90)
Glucose: 101 mg/dl (ref 70–140)
POTASSIUM: 4.6 meq/L (ref 3.5–5.1)
Sodium: 138 mEq/L (ref 136–145)
Total Bilirubin: 0.93 mg/dL (ref 0.20–1.20)
Total Protein: 7 g/dL (ref 6.4–8.3)

## 2015-11-26 NOTE — Progress Notes (Signed)
  Radiation Oncology         [phone]) [phone] ________________________________  Name: Jaime Morris MRN: [id number]  Date: 11/26/2015  DOB: Jan 29, 1961  Simulation Verification Note    ICD-9-CM ICD-10-CM   1. Small cell carcinoma of lung, unspecified laterality (HCC) 162.9 C34.90     Status: outpatient  NARRATIVE: The patient was brought to the treatment unit and placed in the planned treatment position. The clinical setup was verified. Then port films were obtained and uploaded to the radiation oncology medical record software.  The treatment beams were carefully compared against the planned radiation fields. The position location and shape of the radiation fields was reviewed. They targeted volume of tissue appears to be appropriately covered by the radiation beams. Organs at risk appear to be excluded as planned.  Based on my personal review, I approved the simulation verification. The patient's treatment will proceed as planned.  -----------------------------------  Blair Promise, PhD, MD

## 2015-11-26 NOTE — Progress Notes (Signed)
[facility] [phone]   [phone]  OFFICE PROGRESS NOTE  Irven Shelling, MD [place] [address] [place] [address]  DIAGNOSIS:  1) Small cell carcinoma of lung, Extensive stage diagnosed in June 2015. 2) incidental diagnosis of pulmonary embolism on CT scan of the chest performed on 04/18/2014. Primary site: Lung (Right)  Staging method: AJCC 7th Edition  Clinical: Stage IV (T1a, N1, M1b) signed by Curt Bears, MD on 12/07/2013 3:19 PM  Summary: Stage IV (T1a, N1, M1b)   PRIOR THERAPY:  1)  Systemic chemotherapy with carboplatin for AUC of 5 given on day 1, etoposide 100 mg/m2 given on days 1, 2 and 3 with neulast support on day 4. Status post 6 cycles.  2) Lovenox 120 mg subcutaneously started 04/18/2014. 3) status post whole brain irradiation under the care of Dr. Sondra Come completed 06/21/2014. 4) systemic chemotherapy again with carboplatin for AUC of 5 on day 1 and etoposide 100 MG/M2 on days 1, 2 and 3 with Neulasta support on day 4. First cycle expected on 09/17/2015. Status post 2 cycles discontinued secondary to disease progression.  CURRENT THERAPY:  1) systemic chemotherapy with cisplatin 70 MG/M2 and irinotecan 65 MG/M2 on days 1 and 8 every 3 weeks. First dose 11/07/2015. Status post one cycle. 2) initial treatment with Xarelto 20 mg by mouth daily. First dose of treatment on 05/03/2014. This was switched on 10/01/2014 to Coumadin and followed by primary care physician.  DISEASE STAGE:  Small cell carcinoma of lung, Extensive stage  Primary site: Lung (Right)  Staging method: AJCC 7th Edition  Clinical: Stage IV (T1a, N1, M1b) signed by Curt Bears, MD on 12/07/2013 3:19 PM  Summary: Stage IV (T1a, N1, M1b)  CHEMOTHERAPY INTENT: pallative  CURRENT # OF CHEMOTHERAPY CYCLES: 1 CURRENT ANTIEMETICS: Zofran, dexamethasone and compazine  CURRENT SMOKING STATUS: Former smoker, quit 12/18/2013  ORAL CHEMOTHERAPY  AND CONSENT: n/a  CURRENT BISPHOSPHONATES USE: none  PAIN MANAGEMENT: Lortab 5/325 mg  NARCOTICS INDUCED CONSTIPATION: none  LIVING WILL AND CODE STATUS: ?   INTERVAL HISTORY: AKEEN LEDYARD 55 y.o. male returns to the clinic today for followup visit accompanied by her friend. He was started on systemic chemotherapy with reduced dose cisplatin and irinotecan on days 1 and 8 every 3 weeks. He missed day 8 of cycle #1 secondary to hospital admission with pneumonia. He was treated with Augmentin and feeling much better. He had CT scan of the head at that time that showed no evidence for metastatic disease to the brain. The patient is feeling much better today and 80 to resume her systemic chemotherapy. He denied having any significant chest pain, shortness of breath, cough or hemoptysis. He has no fever or chills. He has no nausea or vomiting. He is still in the process of moving to [facility].  MEDICAL HISTORY: Past Medical History  Diagnosis Date  . HIV infection ([facility])     DR. [name]  . Hyperlipidemia   . Insomnia   . Lung nodule, multiple 10/24/13    CT CHEST W/O...DR. [name]  . Gynecomastia, male 10/16/13    MILD RIGHT SIDED PER RADIOLOGY STUDY  . COPD (chronic obstructive pulmonary disease) ([facility])     PFT'S 10/26/13 @ [facility]  . History of depression   . History of pneumonia   . Collapsed lung     d/t biopsy, right  . Radiation 06/05/14-06/20/14    whole brain 25 gray  .  Encounter for antineoplastic chemotherapy 10/08/2015  . Small cell lung cancer (HCC)     ALLERGIES:  is allergic to lactose intolerance (gi).  MEDICATIONS:  Current Outpatient Prescriptions  Medication Sig Dispense Refill  . albuterol (PROAIR HFA) 108 (90 BASE) MCG/ACT inhaler Inhale 2 puffs into the lungs every 4 (four) hours as needed for wheezing or shortness of breath. Reported on 08/13/2015    . amoxicillin-clavulanate (AUGMENTIN) 875-125 MG tablet Take 1 tablet by mouth 2 (two) times daily. 6  tablet 0  . ATRIPLA 600-200-300 MG tablet TAKE 1 TABLET BY MOUTH DAILY 30 tablet 2  . doxylamine, Sleep, (UNISOM) 25 MG tablet Take 50 mg by mouth at bedtime as needed for sleep.     Marland Kitchen dronabinol (MARINOL) 10 MG capsule TAKE ONE CAPSULE BY MOUTH TWICE DAILY BEFORE A MEAL 60 capsule 2  . emollient (BIAFINE) cream Apply topically as needed. Reported on 08/13/2015    . lidocaine-prilocaine (EMLA) cream Apply 1 application topically as needed. 30 g 4  . Melatonin 10 MG TABS Take 10 mg by mouth at bedtime as needed (sleep).    . Multiple Vitamin (MULTIVITAMIN) tablet Take 1 tablet by mouth daily.    . Nutritional Supplements (HIGH-PROTEIN NUTRITIONAL SHAKE) LIQD Take 1 each by mouth 2 (two) times daily between meals.    Marland Kitchen oxyCODONE-acetaminophen (PERCOCET/ROXICET) 5-325 MG tablet Take 1 tablet by mouth every 6 (six) hours as needed for severe pain. 40 tablet 0  . prochlorperazine (COMPAZINE) 10 MG tablet Take 1 tablet (10 mg total) by mouth every 6 (six) hours as needed for nausea or vomiting. 30 tablet 1  . tolterodine (DETROL LA) 4 MG 24 hr capsule take 1 capsule daily  5  . traZODone (DESYREL) 100 MG tablet Take 100 mg by mouth at bedtime.    . varenicline (CHANTIX) 1 MG tablet Take 1 mg by mouth 2 (two) times daily. Reported on 08/13/2015    . vitamin B-12 (CYANOCOBALAMIN) 500 MCG tablet Take 500 mcg by mouth daily.    Marland Kitchen warfarin (COUMADIN) 5 MG tablet Take 1 tablet (5 mg total) by mouth as directed. (Patient taking differently: Take 5 mg by mouth as directed. Take '5mg'$  on Monday and 2.'5mg'$  on other days) 30 tablet 0   No current facility-administered medications for this visit.   Facility-Administered Medications Ordered in Other Visits  Medication Dose Route Frequency Provider Last Rate Last Dose  . sodium chloride 0.9 % injection 10 mL  10 mL Intracatheter PRN Curt Bears, MD   10 mL at 01/30/14 1451    SURGICAL HISTORY:  Past Surgical History  Procedure Laterality Date  . Foot surgery       BUNIONECTOMY  . Back surgery      L 4-5 FUSION WITH SCREW  . Colonoscopy  UTD  . Portacath placement N/A 12/08/2013    Procedure: INSERTION PORT-A-CATH;  Surgeon: Grace Isaac, MD;  Location: [facility] OR;  Service: Thoracic;  Laterality: N/A;    REVIEW OF SYSTEMS:  A comprehensive review of systems was negative except for: Constitutional: positive for fatigue Gastrointestinal: positive for nausea   PHYSICAL EXAMINATION: General appearance: alert, cooperative, fatigued and no distress Head: Normocephalic, without obvious abnormality, atraumatic Neck: no adenopathy, no JVD, supple, symmetrical, trachea midline and thyroid not enlarged, symmetric, no tenderness/mass/nodules Lymph nodes: Cervical, supraclavicular, and axillary nodes normal. Resp: clear to auscultation bilaterally Back: symmetric, no curvature. ROM normal. No CVA tenderness. Cardio: regular rate and rhythm, S1, S2 normal, no murmur, click, rub  or gallop GI: soft, non-tender; bowel sounds normal; no masses,  no organomegaly Extremities: extremities normal, atraumatic, no cyanosis or edema Neurologic: Alert and oriented X 3, normal strength and tone. Normal symmetric reflexes. Normal coordination and gait  ECOG PERFORMANCE STATUS: 1 - Symptomatic but completely ambulatory  Blood pressure 161/97, pulse 110, temperature 98 F (36.7 C), temperature source Oral, resp. rate 18, height '5\' 9"'$  (1.753 m), weight 139 lb 6.4 oz (63.231 kg), SpO2 100 %.  LABORATORY DATA: Lab Results  Component Value Date   WBC 5.2 11/26/2015   HGB 12.4* 11/26/2015   HCT 37.2* 11/26/2015   MCV 98.4* 11/26/2015   PLT 193 11/26/2015      Chemistry      Component Value Date/Time   NA 139 11/15/2015 0500   NA 139 11/07/2015 0740   K 3.4* 11/15/2015 0500   K 3.8 11/07/2015 0740   CL 106 11/15/2015 0500   CO2 24 11/15/2015 0500   CO2 24 11/07/2015 0740   BUN <5* 11/15/2015 0500   BUN 4.5* 11/07/2015 0740   CREATININE 0.63 11/15/2015 0500    CREATININE 0.8 11/07/2015 0740   CREATININE 0.79 10/23/2013 1112      Component Value Date/Time   CALCIUM 8.7* 11/15/2015 0500   CALCIUM 9.1 11/07/2015 0740   ALKPHOS 132 11/07/2015 0740   ALKPHOS 138* 01/22/2014 0955   AST 38* 11/07/2015 0740   AST 23 01/22/2014 0955   ALT 17 11/07/2015 0740   ALT 28 01/22/2014 0955   BILITOT 0.38 11/07/2015 0740   BILITOT 0.5 01/22/2014 0955       RADIOGRAPHIC STUDIES: Dg Chest 2 View  11/14/2015  CLINICAL DATA:  Acute onset of fever, cough and body aches. Patient on chemotherapy for lung cancer. Initial encounter. EXAM: CHEST  2 VIEW COMPARISON:  Chest radiograph performed 03/27/2014, and CT of the chest performed 10/28/2015 FINDINGS: The lungs are well-aerated. Peribronchial thickening is noted. Mild vascular congestion is seen. Minimal right basilar opacity raises concern for mild pneumonia. There is no evidence of pleural effusion or pneumothorax. The heart is normal in size; the mediastinal contour is within normal limits. A left-sided chest port is noted ending about the mid SVC. No acute osseous abnormalities are seen. IMPRESSION: 1. Minimal right basilar opacity raises concern for mild pneumonia. 2. Mild vascular congestion noted.  Peribronchial thickening seen. Electronically Signed   By: Garald Balding M.D.   On: 11/14/2015 03:12   Ct Head Wo Contrast  11/14/2015  CLINICAL DATA:  Headaches. EXAM: CT HEAD WITHOUT CONTRAST TECHNIQUE: Contiguous axial images were obtained from the base of the skull through the vertex without intravenous contrast. COMPARISON:  MRI of September 20, 2015. FINDINGS: Bony calvarium appears intact. Mild chronic ischemic white matter disease is noted. Minimal diffuse cortical atrophy is noted. No mass effect or midline shift is noted. Ventricular size is within normal limits. There is no evidence of mass lesion, hemorrhage or acute infarction. IMPRESSION: Mild chronic ischemic white matter disease. Minimal diffuse cortical atrophy.  No acute intracranial abnormality seen. Electronically Signed   By: Marijo Conception, M.D.   On: 11/14/2015 13:28   Ct Chest W Contrast  10/28/2015  CLINICAL DATA:  Patient with history of lung cancer diagnosed 11/2013. On chemotherapy. EXAM: CT CHEST, ABDOMEN, AND PELVIS WITH CONTRAST TECHNIQUE: Multidetector CT imaging of the chest, abdomen and pelvis was performed following the standard protocol during bolus administration of intravenous contrast. CONTRAST:  141m ISOVUE-300 IOPAMIDOL (ISOVUE-300) INJECTION 61% COMPARISON:  CT CAP  08/12/2015 FINDINGS: CT CHEST FINDINGS Mediastinum/Lymph Nodes: No axillary lymphadenopathy. Grossly unchanged mediastinal adenopathy including an 11 mm right peritracheal lymph node (image 25; series 2) and a 9 mm subcarinal lymph node (image 36; series 2). Stable heart size. Trace pericardial fluid. Coronary arterial vascular calcifications. Unchanged 10 mm right hilar lymph node (image 37; series 2). Unchanged right infrahilar lymph node measuring 11 mm (image 40; series 2). Lungs/Pleura: Re- demonstrated occlusion of the right middle lobe bronchus. There is associated right middle lobe atelectasis. Unchanged 8 mm peripheral nodule within the right middle lobe (image 90; series 4). Extensive centrilobular and paraseptal emphysematous change. Grossly unchanged bilateral 3 mm nodules. Additionally within the right upper lobe (image 109; series 4) there is a new 9 mm nodule. No pleural effusion or pneumothorax. Musculoskeletal: Multiple new sclerotic lesions demonstrated throughout the thoracic spine including a 1.8 cm lesion within the T3 vertebral body, a 0.9 cm lesion within the T8 vertebral body and a 1.0 cm lesion within the T9 vertebral body. CT ABDOMEN PELVIS FINDINGS Hepatobiliary: Interval development of a 1.1 cm low-attenuation lesion within the central liver (image 61; series 2). Interval increase in size of low-attenuation lesion within the peripheral left hepatic lobe  (image 59; series 2) measuring 1.1 x 1.6 cm, previously 1.2 x 0.9 cm. This interval development of a 1.0 cm lesion within the inferior right hepatic lobe (image 83; series 2). There is an additional adjacent new 1.1 cm lesion (image 80; series 2). Slight interval increase in size of subcapsular lesion within the posterior right hepatic lobe measuring 1.0 x 0.5 cm (image 72; series 2), previous measuring 0.8 x 0.7 cm. Additional new 0.5 cm lesion near the hepatic dome (image 58; series 2). Gallbladder is unremarkable. Pancreas: Unremarkable Spleen: Unremarkable Adrenals/Urinary Tract: Interval increase in size of right adrenal mass measuring 1.6 x 2.5 cm (image 68; series 2), previously measuring 1.3 x 2.1 cm. Grossly unchanged left adrenal nodule measuring 1.1 x 0.9 cm (image 68; series 2), previously 1.1 x 0.9 cm. Kidneys enhance symmetrically with contrast. Unchanged too small to characterize low-attenuation lesions. No hydronephrosis. Urinary bladder is unremarkable. Stomach/Bowel: The appendix is normal. No abnormal bowel wall thickening or evidence for bowel obstruction. No free fluid or free intraperitoneal air. Vascular/Lymphatic: Normal caliber abdominal aorta. Peripheral calcified atherosclerotic plaque. No retroperitoneal lymphadenopathy. Other: Prostate is unremarkable. Musculoskeletal: Interval increase in size of S1 lytic lesion measuring 3.5 x 2.6 cm (image 100; series 2), previously measuring 2.5 x 1.7 cm. Re- demonstrated L4-S1 spinal fusion. Interval development of a 2.5 cm sclerotic lesion within the L1 vertebral body. IMPRESSION: Interval development of new and enlargement of additional low-attenuation liver lesions compatible with metastatic disease. Interval enlargement of previously identified sacral lytic lesion. Multiple new osseous metastatic lesions are demonstrated. Grossly unchanged mediastinal and right hilar adenopathy. Interval development of a new 9 mm nodule within the right upper  lobe which is nonspecific and may represent metastasis or a focal infectious/ inflammatory process. Additional smaller nodules within the lungs bilaterally are stable. Interval enlargement of right adrenal mass. Electronically Signed   By: Lovey Newcomer M.D.   On: 10/28/2015 09:36   Ct Abdomen Pelvis W Contrast  10/28/2015  CLINICAL DATA:  Patient with history of lung cancer diagnosed 11/2013. On chemotherapy. EXAM: CT CHEST, ABDOMEN, AND PELVIS WITH CONTRAST TECHNIQUE: Multidetector CT imaging of the chest, abdomen and pelvis was performed following the standard protocol during bolus administration of intravenous contrast. CONTRAST:  139m ISOVUE-300 IOPAMIDOL (ISOVUE-300) INJECTION 61%  COMPARISON:  CT CAP 08/12/2015 FINDINGS: CT CHEST FINDINGS Mediastinum/Lymph Nodes: No axillary lymphadenopathy. Grossly unchanged mediastinal adenopathy including an 11 mm right peritracheal lymph node (image 25; series 2) and a 9 mm subcarinal lymph node (image 36; series 2). Stable heart size. Trace pericardial fluid. Coronary arterial vascular calcifications. Unchanged 10 mm right hilar lymph node (image 37; series 2). Unchanged right infrahilar lymph node measuring 11 mm (image 40; series 2). Lungs/Pleura: Re- demonstrated occlusion of the right middle lobe bronchus. There is associated right middle lobe atelectasis. Unchanged 8 mm peripheral nodule within the right middle lobe (image 90; series 4). Extensive centrilobular and paraseptal emphysematous change. Grossly unchanged bilateral 3 mm nodules. Additionally within the right upper lobe (image 109; series 4) there is a new 9 mm nodule. No pleural effusion or pneumothorax. Musculoskeletal: Multiple new sclerotic lesions demonstrated throughout the thoracic spine including a 1.8 cm lesion within the T3 vertebral body, a 0.9 cm lesion within the T8 vertebral body and a 1.0 cm lesion within the T9 vertebral body. CT ABDOMEN PELVIS FINDINGS Hepatobiliary: Interval development  of a 1.1 cm low-attenuation lesion within the central liver (image 61; series 2). Interval increase in size of low-attenuation lesion within the peripheral left hepatic lobe (image 59; series 2) measuring 1.1 x 1.6 cm, previously 1.2 x 0.9 cm. This interval development of a 1.0 cm lesion within the inferior right hepatic lobe (image 83; series 2). There is an additional adjacent new 1.1 cm lesion (image 80; series 2). Slight interval increase in size of subcapsular lesion within the posterior right hepatic lobe measuring 1.0 x 0.5 cm (image 72; series 2), previous measuring 0.8 x 0.7 cm. Additional new 0.5 cm lesion near the hepatic dome (image 58; series 2). Gallbladder is unremarkable. Pancreas: Unremarkable Spleen: Unremarkable Adrenals/Urinary Tract: Interval increase in size of right adrenal mass measuring 1.6 x 2.5 cm (image 68; series 2), previously measuring 1.3 x 2.1 cm. Grossly unchanged left adrenal nodule measuring 1.1 x 0.9 cm (image 68; series 2), previously 1.1 x 0.9 cm. Kidneys enhance symmetrically with contrast. Unchanged too small to characterize low-attenuation lesions. No hydronephrosis. Urinary bladder is unremarkable. Stomach/Bowel: The appendix is normal. No abnormal bowel wall thickening or evidence for bowel obstruction. No free fluid or free intraperitoneal air. Vascular/Lymphatic: Normal caliber abdominal aorta. Peripheral calcified atherosclerotic plaque. No retroperitoneal lymphadenopathy. Other: Prostate is unremarkable. Musculoskeletal: Interval increase in size of S1 lytic lesion measuring 3.5 x 2.6 cm (image 100; series 2), previously measuring 2.5 x 1.7 cm. Re- demonstrated L4-S1 spinal fusion. Interval development of a 2.5 cm sclerotic lesion within the L1 vertebral body. IMPRESSION: Interval development of new and enlargement of additional low-attenuation liver lesions compatible with metastatic disease. Interval enlargement of previously identified sacral lytic lesion. Multiple  new osseous metastatic lesions are demonstrated. Grossly unchanged mediastinal and right hilar adenopathy. Interval development of a new 9 mm nodule within the right upper lobe which is nonspecific and may represent metastasis or a focal infectious/ inflammatory process. Additional smaller nodules within the lungs bilaterally are stable. Interval enlargement of right adrenal mass. Electronically Signed   By: Lovey Newcomer M.D.   On: 10/28/2015 09:36   ASSESSMENT AND PLAN:  This is a very pleasant 55 years old [place] American male recently diagnosed with:  1) Extensive stage small cell lung cancer: He is status post 6 cycles of systemic chemotherapy with carboplatin and etoposide. This was followed by prophylactic cranial irradiation.  He has been on observation for the last  18 months.  Unfortunately the recent CT scan of the chest, abdomen and pelvis showed evidence for disease progression including right paratracheal lymphadenopathy in addition to bilateral adrenal gland masses and new small liver lesion as well as bone lesion. He was started on second course of treatment with chemotherapy was carboplatin and etoposide status post 2 cycle. He tolerated his treatment well with no significant adverse effects except for fatigue. Unfortunately the recent CT scan of the chest, abdomen and pelvis showed evidence for further disease progression in the liver and bone. His treatment was carboplatin and etoposide was discontinued. The patient was started on second line treatment with reduced dose cisplatin and irinotecan on days 1 and 8 every 3 weeks. He received only day 1 of cycle #1. He missed day 8 of cycle #1 secondary to admission to the hospital with pneumonia. He is feeling better today and the patient would like to resume his systemic chemotherapy. He will start cycle #2 on 11/28/2015. The patient is also in the process of moving to [place] to be close to his family. I referred him to Dr. Lillia Abed, thoracic oncologist at [facility] for evaluation and further treatment options. He would come back for follow-up visit in 3 weeks for evaluation with the start of cycle #3.  2) right lower lobe pulmonary embolus: He will continue his Coumadin treatment under the care of his primary care physician.  3) For the weight loss, I recommended for the patient to continue Marinol.   He was advised to call immediately if he has any concerning symptoms in the interval. The patient voices understanding of current disease status and treatment options and is in agreement with the current care plan.  All questions were answered. The patient knows to call the clinic with any problems, questions or concerns. We can certainly see the patient much sooner if necessary.  Disclaimer: This note was dictated with voice recognition software. Similar sounding words can inadvertently be transcribed and may not be corrected upon review.

## 2015-11-26 NOTE — Progress Notes (Signed)
  Radiation Oncology         [phone] ________________________________  Name: Jaime Morris MRN: [id number]  Date: 11/21/2015  DOB: [birth year]  SIMULATION AND TREATMENT PLANNING NOTE    ICD-9-CM ICD-10-CM   1. Small cell carcinoma of lung, unspecified laterality (HCC) 162.9 C34.90     DIAGNOSIS: Extensive stage small cell lung cancer, progressive spine lesions   NARRATIVE:  The patient was brought to the [facility].  Identity was confirmed.  All relevant records and images related to the planned course of therapy were reviewed.  The patient freely provided informed written consent to proceed with treatment after reviewing the details related to the planned course of therapy. The consent form was witnessed and verified by the simulation staff.  Then, the patient was set-up in a stable reproducible  supine position for radiation therapy.  CT images were obtained.  Surface markings were placed.  The CT images were loaded into the planning software.  Then the target and avoidance structures were contoured.  Treatment planning then occurred.  The radiation prescription was entered and confirmed.  Then, I designed and supervised the construction of a total of 5 medically necessary complex treatment devices.  I have requested : 3D Simulation  I have requested a DVH of the following structures: GTV, bladder rectum, bowel.  I have ordered:dose calc.  PLAN:  The patient will receive 30 Gy in 10 fractions Directed at the upper sacral region.  ________________________________  -----------------------------------  Blair Promise, PhD, MD

## 2015-11-26 NOTE — Telephone Encounter (Signed)
Gave and printed appt sched and avs for pt for June thru July

## 2015-11-27 ENCOUNTER — Encounter: Payer: Self-pay | Admitting: Internal Medicine

## 2015-11-27 ENCOUNTER — Ambulatory Visit
Admission: RE | Admit: 2015-11-27 | Discharge: 2015-11-27 | Disposition: A | Discharge status: Home / Self Care | Payer: BLUE CROSS/BLUE SHIELD | Source: Ambulatory Visit | Attending: Radiation Oncology | Admitting: Radiation Oncology

## 2015-11-27 DIAGNOSIS — Z510 Encounter for antineoplastic radiation therapy: Secondary | ICD-10-CM | POA: Diagnosis not present

## 2015-11-27 DIAGNOSIS — C7951 Secondary malignant neoplasm of bone: Secondary | ICD-10-CM

## 2015-11-27 NOTE — Progress Notes (Signed)
patient left with me 11/27/15

## 2015-11-27 NOTE — Addendum Note (Signed)
Encounter addended by: Benn Moulder, RN on: 11/27/2015  6:33 PM<BR>     Documentation filed: Charges VN

## 2015-11-27 NOTE — Progress Notes (Signed)
Pt here for patient teaching.  Pt given Radiation and You booklet.  Reviewed areas of pertinence such as diarrhea, fatigue, nausea and vomiting and skin changes . Pt able to give teach back of to pat skin, use unscented/gentle soap, use baby wipes, have Imodium on hand, drink plenty of water and sitz bath,avoid applying anything to skin within 4 hours of treatment. Pt demonstrated understanding and verbalizes understanding of information given and will contact nursing with any questions or concerns.

## 2015-11-28 ENCOUNTER — Ambulatory Visit
Admission: RE | Admit: 2015-11-28 | Discharge: 2015-11-28 | Disposition: A | Discharge status: Home / Self Care | Payer: BLUE CROSS/BLUE SHIELD | Source: Ambulatory Visit | Attending: Radiation Oncology | Admitting: Radiation Oncology

## 2015-11-28 ENCOUNTER — Ambulatory Visit (HOSPITAL_BASED_OUTPATIENT_CLINIC_OR_DEPARTMENT_OTHER): Payer: BLUE CROSS/BLUE SHIELD

## 2015-11-28 ENCOUNTER — Ambulatory Visit: Payer: BLUE CROSS/BLUE SHIELD | Admitting: Internal Medicine

## 2015-11-28 VITALS — BP 142/98 | HR 108 | Temp 98.1°F | Resp 19

## 2015-11-28 DIAGNOSIS — Z5111 Encounter for antineoplastic chemotherapy: Secondary | ICD-10-CM | POA: Diagnosis not present

## 2015-11-28 DIAGNOSIS — C3492 Malignant neoplasm of unspecified part of left bronchus or lung: Secondary | ICD-10-CM | POA: Diagnosis not present

## 2015-11-28 DIAGNOSIS — C3491 Malignant neoplasm of unspecified part of right bronchus or lung: Secondary | ICD-10-CM | POA: Diagnosis not present

## 2015-11-28 DIAGNOSIS — Z510 Encounter for antineoplastic radiation therapy: Secondary | ICD-10-CM | POA: Diagnosis not present

## 2015-11-28 DIAGNOSIS — C3490 Malignant neoplasm of unspecified part of unspecified bronchus or lung: Secondary | ICD-10-CM

## 2015-11-28 MED ORDER — SODIUM CHLORIDE 0.9 % IV SOLN
30.0000 mg/m2 | Freq: Once | INTRAVENOUS | Status: AC
  Administered 2015-11-28: 55 mg via INTRAVENOUS
  Filled 2015-11-28: qty 55

## 2015-11-28 MED ORDER — PALONOSETRON HCL INJECTION 0.25 MG/5ML
INTRAVENOUS | Status: AC
  Filled 2015-11-28: qty 5

## 2015-11-28 MED ORDER — SODIUM CHLORIDE 0.9% FLUSH
10.0000 mL | INTRAVENOUS | Status: DC | PRN
  Administered 2015-11-28: 10 mL
  Filled 2015-11-28: qty 10

## 2015-11-28 MED ORDER — SODIUM CHLORIDE 0.9 % IV SOLN
Freq: Once | INTRAVENOUS | Status: AC
  Administered 2015-11-28: 11:00:00 via INTRAVENOUS
  Filled 2015-11-28: qty 5

## 2015-11-28 MED ORDER — ATROPINE SULFATE 1 MG/ML IJ SOLN
INTRAMUSCULAR | Status: AC
  Filled 2015-11-28: qty 1

## 2015-11-28 MED ORDER — SODIUM CHLORIDE 0.9 % IV SOLN
Freq: Once | INTRAVENOUS | Status: AC
  Administered 2015-11-28: 09:00:00 via INTRAVENOUS

## 2015-11-28 MED ORDER — HEPARIN SOD (PORK) LOCK FLUSH 100 UNIT/ML IV SOLN
500.0000 [IU] | Freq: Once | INTRAVENOUS | Status: AC | PRN
  Administered 2015-11-28: 500 [IU]
  Filled 2015-11-28: qty 5

## 2015-11-28 MED ORDER — PALONOSETRON HCL INJECTION 0.25 MG/5ML
0.2500 mg | Freq: Once | INTRAVENOUS | Status: AC
  Administered 2015-11-28: 0.25 mg via INTRAVENOUS

## 2015-11-28 MED ORDER — POTASSIUM CHLORIDE 2 MEQ/ML IV SOLN
Freq: Once | INTRAVENOUS | Status: AC
  Administered 2015-11-28: 09:00:00 via INTRAVENOUS
  Filled 2015-11-28: qty 10

## 2015-11-28 MED ORDER — IRINOTECAN HCL CHEMO INJECTION 100 MG/5ML
66.0000 mg/m2 | Freq: Once | INTRAVENOUS | Status: AC
  Administered 2015-11-28: 120 mg via INTRAVENOUS
  Filled 2015-11-28: qty 6

## 2015-11-28 MED ORDER — ATROPINE SULFATE 1 MG/ML IJ SOLN
0.5000 mg | Freq: Once | INTRAMUSCULAR | Status: AC | PRN
  Administered 2015-11-28: 0.5 mg via INTRAVENOUS

## 2015-11-28 NOTE — Patient Instructions (Signed)
[facility] Discharge Instructions for Patients Receiving Chemotherapy  Today you received the following chemotherapy agents Camptosar/Cisplatin  To help prevent nausea and vomiting after your treatment, we encourage you to take your nausea medication    If you develop nausea and vomiting that is not controlled by your nausea medication, call the clinic.   BELOW ARE SYMPTOMS THAT SHOULD BE REPORTED IMMEDIATELY:  *FEVER GREATER THAN 100.5 F  *CHILLS WITH OR WITHOUT FEVER  NAUSEA AND VOMITING THAT IS NOT CONTROLLED WITH YOUR NAUSEA MEDICATION  *UNUSUAL SHORTNESS OF BREATH  *UNUSUAL BRUISING OR BLEEDING  TENDERNESS IN MOUTH AND THROAT WITH OR WITHOUT PRESENCE OF ULCERS  *URINARY PROBLEMS  *BOWEL PROBLEMS  UNUSUAL RASH Items with * indicate a potential emergency and should be followed up as soon as possible.  Feel free to call the clinic you have any questions or concerns. The clinic phone number is [phone].  Please show the [facility] at check-in to the Emergency Department and triage nurse.

## 2015-11-29 ENCOUNTER — Encounter: Payer: Self-pay | Admitting: *Deleted

## 2015-11-29 ENCOUNTER — Ambulatory Visit
Admission: RE | Admit: 2015-11-29 | Discharge: 2015-11-29 | Disposition: A | Discharge status: Home / Self Care | Payer: BLUE CROSS/BLUE SHIELD | Source: Ambulatory Visit | Attending: Radiation Oncology | Admitting: Radiation Oncology

## 2015-11-29 DIAGNOSIS — Z510 Encounter for antineoplastic radiation therapy: Secondary | ICD-10-CM | POA: Diagnosis not present

## 2015-11-29 NOTE — Progress Notes (Signed)
[place] Psychosocial Distress Screening Clinical Social Work  Clinical Social Work was referred by distress screening protocol.  The patient scored a 7 on the Psychosocial Distress Thermometer which indicates moderate distress. Clinical Social Worker met with patient in infusion room to assess for distress and other psychosocial needs. CSW familiar with patient, been following patient since diagnosis.  Jaime Morris was accompanied by his niece who is visiting him.  He is preparing to move to [place] to be closer to his family.  CSW reviewed distress screen and provided brief emotional support.  ONCBCN DISTRESS SCREENING 11/20/2015  Screening Type Initial Screening  Distress experienced in past week (1-10) 7  Practical problem type   Emotional problem type Boredom  Spiritual/Religous concerns type Relating to God  Information Concerns Type   Physical Problem type Pain;Nausea/vomiting;Breathing;Loss of appetitie;Constipation/diarrhea;Tingling hands/feet;Skin dry/itchy  Physician notified of physical symptoms Yes  Referral to clinical social work Yes    [name], MSW, LCSW, OSW-C Clinical Social Worker [facility] [phone]

## 2015-12-02 ENCOUNTER — Encounter: Payer: Self-pay | Admitting: Internal Medicine

## 2015-12-02 ENCOUNTER — Ambulatory Visit
Admission: RE | Admit: 2015-12-02 | Discharge: 2015-12-02 | Disposition: A | Discharge status: Home / Self Care | Payer: BLUE CROSS/BLUE SHIELD | Source: Ambulatory Visit | Attending: Radiation Oncology | Admitting: Radiation Oncology

## 2015-12-02 ENCOUNTER — Encounter: Payer: Self-pay | Admitting: Radiation Oncology

## 2015-12-02 VITALS — BP 134/89 | HR 90 | Temp 98.5°F | Ht 69.0 in | Wt 142.2 lb

## 2015-12-02 DIAGNOSIS — Z510 Encounter for antineoplastic radiation therapy: Secondary | ICD-10-CM | POA: Diagnosis not present

## 2015-12-02 DIAGNOSIS — C3490 Malignant neoplasm of unspecified part of unspecified bronchus or lung: Secondary | ICD-10-CM

## 2015-12-02 NOTE — Progress Notes (Signed)
patient left with me 11/27/15- left for dr Julien Nordmann to sign

## 2015-12-02 NOTE — Progress Notes (Signed)
Jaime Morris has completed 5 fractions to his sacrum.  He denies having pain.  He reports having one episode of vomiting this morning.  He takes compazine which helps sometimes.  He denies having any bowel issues.  He reports having slight fatigue. He has been given a sitz bath and was instructed on how to use it.  BP 134/89 mmHg  Pulse 90  Temp(Src) 98.5 F (36.9 C) (Oral)  Ht '5\' 9"'$  (1.753 m)  Wt 142 lb 3.2 oz (64.501 kg)  BMI 20.99 kg/m2  SpO2 100%

## 2015-12-02 NOTE — Progress Notes (Signed)
  Radiation Oncology         [phone]) [phone] ________________________________  Name: Jaime Morris MRN: [id number]  Date: 12/02/2015  DOB: [birth year]  Weekly Radiation Therapy Management    ICD-9-CM ICD-10-CM   1. Small cell carcinoma of lung, unspecified laterality (HCC) 162.9 C34.90    DIAGNOSIS: Extensive stage small cell lung cancer, progressive spine lesions    Current Dose: 15 Gy     Planned Dose:  30 Gy  Narrative . . . . . . . . The patient presents for routine under treatment assessment.                                   Jaime Morris has completed 5 fractions to his sacrum. He denies having pain. He reports having one episode of vomiting this morning. He takes compazine which helps sometimes. He denies having any bowel issues. He reports having slight fatigue. He has been given a sitz bath and was instructed on how to use it.                                 Set-up films were reviewed.                                 The chart was checked. Physical Findings. . .  height is '5\' 9"'$  (1.753 m) and weight is 142 lb 3.2 oz (64.501 kg). His oral temperature is 98.5 F (36.9 C). His blood pressure is 134/89 and his pulse is 90. His oxygen saturation is 100%. . The lungs are clear. The heart has a regular rhythm and rate. The abdomen is soft and nontender with normal bowel sounds.  Impression . . . . . . . The patient is tolerating radiation. Plan . . . . . . . . . . . . Continue treatment as planned.  ________________________________   Blair Promise, PhD, MD

## 2015-12-03 ENCOUNTER — Ambulatory Visit
Admission: RE | Admit: 2015-12-03 | Discharge: 2015-12-03 | Disposition: A | Discharge status: Home / Self Care | Payer: BLUE CROSS/BLUE SHIELD | Source: Ambulatory Visit | Attending: Radiation Oncology | Admitting: Radiation Oncology

## 2015-12-03 ENCOUNTER — Encounter: Payer: Self-pay | Admitting: Internal Medicine

## 2015-12-03 DIAGNOSIS — Z510 Encounter for antineoplastic radiation therapy: Secondary | ICD-10-CM | POA: Diagnosis not present

## 2015-12-03 NOTE — Progress Notes (Signed)
patient left with me 11/27/15- left for dr Julien Nordmann to sign- let patient know ready for pick up and sent to medical recrds

## 2015-12-04 ENCOUNTER — Ambulatory Visit
Admission: RE | Admit: 2015-12-04 | Discharge: 2015-12-04 | Disposition: A | Discharge status: Home / Self Care | Payer: BLUE CROSS/BLUE SHIELD | Source: Ambulatory Visit | Attending: Radiation Oncology | Admitting: Radiation Oncology

## 2015-12-04 DIAGNOSIS — Z510 Encounter for antineoplastic radiation therapy: Secondary | ICD-10-CM | POA: Diagnosis not present

## 2015-12-05 ENCOUNTER — Ambulatory Visit
Admission: RE | Admit: 2015-12-05 | Discharge: 2015-12-05 | Disposition: A | Discharge status: Home / Self Care | Payer: BLUE CROSS/BLUE SHIELD | Source: Ambulatory Visit | Attending: Radiation Oncology | Admitting: Radiation Oncology

## 2015-12-05 ENCOUNTER — Ambulatory Visit: Payer: BLUE CROSS/BLUE SHIELD

## 2015-12-05 ENCOUNTER — Encounter: Payer: Self-pay | Admitting: Radiation Oncology

## 2015-12-05 ENCOUNTER — Other Ambulatory Visit (HOSPITAL_BASED_OUTPATIENT_CLINIC_OR_DEPARTMENT_OTHER): Payer: BLUE CROSS/BLUE SHIELD

## 2015-12-05 ENCOUNTER — Ambulatory Visit (HOSPITAL_BASED_OUTPATIENT_CLINIC_OR_DEPARTMENT_OTHER): Payer: BLUE CROSS/BLUE SHIELD

## 2015-12-05 VITALS — BP 144/91 | HR 101 | Temp 98.0°F | Ht 69.0 in | Wt 142.2 lb

## 2015-12-05 VITALS — BP 153/98 | HR 59 | Temp 98.3°F | Resp 18

## 2015-12-05 DIAGNOSIS — Z5111 Encounter for antineoplastic chemotherapy: Secondary | ICD-10-CM | POA: Diagnosis not present

## 2015-12-05 DIAGNOSIS — C3490 Malignant neoplasm of unspecified part of unspecified bronchus or lung: Secondary | ICD-10-CM

## 2015-12-05 DIAGNOSIS — Z510 Encounter for antineoplastic radiation therapy: Secondary | ICD-10-CM | POA: Diagnosis not present

## 2015-12-05 DIAGNOSIS — C3492 Malignant neoplasm of unspecified part of left bronchus or lung: Secondary | ICD-10-CM | POA: Diagnosis not present

## 2015-12-05 DIAGNOSIS — C3491 Malignant neoplasm of unspecified part of right bronchus or lung: Secondary | ICD-10-CM

## 2015-12-05 DIAGNOSIS — Z95828 Presence of other vascular implants and grafts: Secondary | ICD-10-CM

## 2015-12-05 LAB — CBC WITH DIFFERENTIAL/PLATELET
BASO%: 0.6 % (ref 0.0–2.0)
Basophils Absolute: 0 10*3/uL (ref 0.0–0.1)
EOS ABS: 0 10*3/uL (ref 0.0–0.5)
EOS%: 1.1 % (ref 0.0–7.0)
HEMATOCRIT: 32.5 % — AB (ref 38.4–49.9)
HEMOGLOBIN: 11 g/dL — AB (ref 13.0–17.1)
LYMPH#: 0.9 10*3/uL (ref 0.9–3.3)
LYMPH%: 25.1 % (ref 14.0–49.0)
MCH: 33.2 pg (ref 27.2–33.4)
MCHC: 33.8 g/dL (ref 32.0–36.0)
MCV: 98.2 fL — ABNORMAL HIGH (ref 79.3–98.0)
MONO#: 0.3 10*3/uL (ref 0.1–0.9)
MONO%: 8.3 % (ref 0.0–14.0)
NEUT%: 64.9 % (ref 39.0–75.0)
NEUTROS ABS: 2.4 10*3/uL (ref 1.5–6.5)
PLATELETS: 152 10*3/uL (ref 140–400)
RBC: 3.31 10*6/uL — ABNORMAL LOW (ref 4.20–5.82)
RDW: 14.4 % (ref 11.0–14.6)
WBC: 3.6 10*3/uL — AB (ref 4.0–10.3)
nRBC: 0 % (ref 0–0)

## 2015-12-05 LAB — COMPREHENSIVE METABOLIC PANEL
ALBUMIN: 3.5 g/dL (ref 3.5–5.0)
ALK PHOS: 76 U/L (ref 40–150)
ALT: 14 U/L (ref 0–55)
AST: 20 U/L (ref 5–34)
Anion Gap: 8 mEq/L (ref 3–11)
BILIRUBIN TOTAL: 0.33 mg/dL (ref 0.20–1.20)
CALCIUM: 9.3 mg/dL (ref 8.4–10.4)
CO2: 23 mEq/L (ref 22–29)
CREATININE: 0.7 mg/dL (ref 0.7–1.3)
Chloride: 103 mEq/L (ref 98–109)
EGFR: >90 mL/min/{1.73_m2} (ref >90)
Glucose: 99 mg/dl (ref 70–140)
Potassium: 4.5 mEq/L (ref 3.5–5.1)
Sodium: 135 mEq/L — ABNORMAL LOW (ref 136–145)
TOTAL PROTEIN: 6.6 g/dL (ref 6.4–8.3)

## 2015-12-05 MED ORDER — PALONOSETRON HCL INJECTION 0.25 MG/5ML
INTRAVENOUS | Status: AC
  Filled 2015-12-05: qty 5

## 2015-12-05 MED ORDER — PALONOSETRON HCL INJECTION 0.25 MG/5ML
0.2500 mg | Freq: Once | INTRAVENOUS | Status: AC
  Administered 2015-12-05: 0.25 mg via INTRAVENOUS

## 2015-12-05 MED ORDER — SODIUM CHLORIDE 0.9 % IV SOLN
30.0000 mg/m2 | Freq: Once | INTRAVENOUS | Status: AC
  Administered 2015-12-05: 55 mg via INTRAVENOUS
  Filled 2015-12-05: qty 55

## 2015-12-05 MED ORDER — SODIUM CHLORIDE 0.9 % IV SOLN
Freq: Once | INTRAVENOUS | Status: AC
  Administered 2015-12-05: 10:00:00 via INTRAVENOUS
  Filled 2015-12-05: qty 5

## 2015-12-05 MED ORDER — SODIUM CHLORIDE 0.9 % IJ SOLN
10.0000 mL | INTRAMUSCULAR | Status: DC | PRN
  Administered 2015-12-05: 10 mL via INTRAVENOUS
  Filled 2015-12-05: qty 10

## 2015-12-05 MED ORDER — POTASSIUM CHLORIDE 2 MEQ/ML IV SOLN
Freq: Once | INTRAVENOUS | Status: AC
  Administered 2015-12-05: 10:00:00 via INTRAVENOUS
  Filled 2015-12-05: qty 10

## 2015-12-05 MED ORDER — ATROPINE SULFATE 1 MG/ML IJ SOLN
0.5000 mg | Freq: Once | INTRAMUSCULAR | Status: AC | PRN
  Administered 2015-12-05: 0.5 mg via INTRAVENOUS

## 2015-12-05 MED ORDER — IRINOTECAN HCL CHEMO INJECTION 100 MG/5ML
66.0000 mg/m2 | Freq: Once | INTRAVENOUS | Status: AC
  Administered 2015-12-05: 120 mg via INTRAVENOUS
  Filled 2015-12-05: qty 6

## 2015-12-05 MED ORDER — ATROPINE SULFATE 1 MG/ML IJ SOLN
INTRAMUSCULAR | Status: AC
  Filled 2015-12-05: qty 1

## 2015-12-05 MED ORDER — HEPARIN SOD (PORK) LOCK FLUSH 100 UNIT/ML IV SOLN
500.0000 [IU] | Freq: Once | INTRAVENOUS | Status: AC | PRN
  Administered 2015-12-05: 500 [IU]
  Filled 2015-12-05: qty 5

## 2015-12-05 MED ORDER — SODIUM CHLORIDE 0.9 % IV SOLN
Freq: Once | INTRAVENOUS | Status: AC
  Administered 2015-12-05: 10:00:00 via INTRAVENOUS

## 2015-12-05 MED ORDER — SODIUM CHLORIDE 0.9% FLUSH
10.0000 mL | INTRAVENOUS | Status: DC | PRN
  Administered 2015-12-05: 10 mL
  Filled 2015-12-05: qty 10

## 2015-12-05 NOTE — Patient Instructions (Signed)
[place] [facility] Discharge Instructions for Patients Receiving Chemotherapy  Today you received the following chemotherapy agents Camptosar/Cisplatin  To help prevent nausea and vomiting after your treatment, we encourage you to take your nausea medication    If you develop nausea and vomiting that is not controlled by your nausea medication, call the clinic.   BELOW ARE SYMPTOMS THAT SHOULD BE REPORTED IMMEDIATELY:  *FEVER GREATER THAN 100.5 F  *CHILLS WITH OR WITHOUT FEVER  NAUSEA AND VOMITING THAT IS NOT CONTROLLED WITH YOUR NAUSEA MEDICATION  *UNUSUAL SHORTNESS OF BREATH  *UNUSUAL BRUISING OR BLEEDING  TENDERNESS IN MOUTH AND THROAT WITH OR WITHOUT PRESENCE OF ULCERS  *URINARY PROBLEMS  *BOWEL PROBLEMS  UNUSUAL RASH Items with * indicate a potential emergency and should be followed up as soon as possible.  Feel free to call the clinic you have any questions or concerns. The clinic phone number is [phone].  Please show the [place] at check-in to the Emergency Department and triage nurse.

## 2015-12-05 NOTE — Progress Notes (Signed)
  Radiation Oncology         [phone]) [phone] ________________________________  Name: Jaime Morris MRN: [id number]  Date: 12/05/2015  DOB: 1960/07/06   Weekly Radiation Therapy Management    ICD-9-CM ICD-10-CM   1. Small cell carcinoma of lung, unspecified laterality (HCC) 162.9 C34.90      DIAGNOSIS: Extensive stage small cell lung cancer, progressive spine lesions    Current Dose: 24 Gy     Planned Dose:  30 Gy  Narrative . . . . . . . . The patient presents for routine under treatment assessment.  Jaime Morris has completed 8 fractions to his sacrum. He denies having pain. He reports having diarrhea for the past 3 days. He reports that he has imodium to take as needed. He reports feeling a little tired. He reports having nasuea and emesis on Monday. He denies having any skin irritation in the treatment area. He has been given a one month follow up appointment card by the nurse.                                 Set-up films were reviewed.                                 The chart was checked. Physical Findings. . .  height is '5\' 9"'$  (1.753 m) and weight is 142 lb 3.2 oz (64.501 kg). His oral temperature is 98 F (36.7 C). His blood pressure is 144/91 and his pulse is 101. . The lungs are clear. The heart has a regular rhythm and rate. The abdomen is soft and nontender with normal bowel sounds. Impression . . . . . . . The patient is tolerating radiation. His pain is better with palliative radiation therapy Plan . . . . . . . . . . . . Continue treatment as planned.  ________________________________   Blair Promise, PhD, MD  This document serves as a record of services personally performed by Gery Pray, MD. It was created on his behalf by Darcus Austin, a trained medical scribe. The creation of this record is based on the scribe's personal observations and the provider's statements to them. This document has been checked and approved by the attending provider.

## 2015-12-05 NOTE — Patient Instructions (Signed)

## 2015-12-05 NOTE — Progress Notes (Signed)
Jaime Morris has completed 8 fractions to his sacrum.  He denies having pain.  He reports having diarrhea for the past 3 days.  He reports that he has imodium to take as needed.  He reports feeling a little tired.  He reports having nasuea and vomiting on Monday.  He denies having any skin irritation in the treatment area.  He has been given a one month follow up appointment.  BP 144/91 mmHg  Pulse 101  Temp(Src) 98 F (36.7 C) (Oral)  Ht '5\' 9"'$  (1.753 m)  Wt 142 lb 3.2 oz (64.501 kg)  BMI 20.99 kg/m2   Wt Readings from Last 3 Encounters:  12/05/15 142 lb 3.2 oz (64.501 kg)  12/02/15 142 lb 3.2 oz (64.501 kg)  11/26/15 139 lb 6.4 oz (63.231 kg)

## 2015-12-06 ENCOUNTER — Ambulatory Visit
Admission: RE | Admit: 2015-12-06 | Discharge: 2015-12-06 | Disposition: A | Discharge status: Home / Self Care | Payer: BLUE CROSS/BLUE SHIELD | Source: Ambulatory Visit | Attending: Radiation Oncology | Admitting: Radiation Oncology

## 2015-12-06 DIAGNOSIS — Z510 Encounter for antineoplastic radiation therapy: Secondary | ICD-10-CM | POA: Diagnosis not present

## 2015-12-09 ENCOUNTER — Encounter: Payer: Self-pay | Admitting: Radiation Oncology

## 2015-12-09 ENCOUNTER — Ambulatory Visit
Admission: RE | Admit: 2015-12-09 | Discharge: 2015-12-09 | Disposition: A | Discharge status: Home / Self Care | Payer: BLUE CROSS/BLUE SHIELD | Source: Ambulatory Visit | Attending: Radiation Oncology | Admitting: Radiation Oncology

## 2015-12-09 DIAGNOSIS — Z510 Encounter for antineoplastic radiation therapy: Secondary | ICD-10-CM | POA: Diagnosis not present

## 2015-12-11 ENCOUNTER — Telehealth: Payer: Self-pay

## 2015-12-11 ENCOUNTER — Encounter: Payer: Self-pay | Admitting: Internal Medicine

## 2015-12-11 ENCOUNTER — Ambulatory Visit (INDEPENDENT_AMBULATORY_CARE_PROVIDER_SITE_OTHER): Payer: BLUE CROSS/BLUE SHIELD | Admitting: Internal Medicine

## 2015-12-11 VITALS — BP 151/94 | HR 109 | Temp 98.4°F | Wt 138.8 lb

## 2015-12-11 DIAGNOSIS — C3490 Malignant neoplasm of unspecified part of unspecified bronchus or lung: Secondary | ICD-10-CM | POA: Diagnosis not present

## 2015-12-11 DIAGNOSIS — B20 Human immunodeficiency virus [HIV] disease: Secondary | ICD-10-CM | POA: Diagnosis not present

## 2015-12-11 MED ORDER — ELVITEG-COBIC-EMTRICIT-TENOFAF 150-150-200-10 MG PO TABS: 1.0000 | ORAL_TABLET | Freq: Every day | ORAL | Status: DC

## 2015-12-11 NOTE — Telephone Encounter (Signed)
Called [facility] to set up patient's profile and to check co-pay cost. Spoke with Shellin that stated patient will have a $0 co-pay for [facility] prescription. Set up account and gave verbal prescription order to pharmacist [facility]. Verbal re-back verified. Rodman Key, LPN  Patient made aware of $0 co-pay and that Genvoya medication will take 3-5 days to receive. Patient stated understanding. Rodman Key, LPN

## 2015-12-12 ENCOUNTER — Other Ambulatory Visit (HOSPITAL_BASED_OUTPATIENT_CLINIC_OR_DEPARTMENT_OTHER): Payer: BLUE CROSS/BLUE SHIELD

## 2015-12-12 DIAGNOSIS — C3492 Malignant neoplasm of unspecified part of left bronchus or lung: Secondary | ICD-10-CM

## 2015-12-12 DIAGNOSIS — C3491 Malignant neoplasm of unspecified part of right bronchus or lung: Secondary | ICD-10-CM

## 2015-12-12 LAB — COMPREHENSIVE METABOLIC PANEL
ALT: 10 U/L (ref 0–55)
ANION GAP: 9 meq/L (ref 3–11)
AST: 14 U/L (ref 5–34)
Albumin: 3.5 g/dL (ref 3.5–5.0)
Alkaline Phosphatase: 82 U/L (ref 40–150)
BUN: 4.3 mg/dL — ABNORMAL LOW (ref 7.0–26.0)
CALCIUM: 9.8 mg/dL (ref 8.4–10.4)
CHLORIDE: 100 meq/L (ref 98–109)
CO2: 26 meq/L (ref 22–29)
Creatinine: 0.7 mg/dL (ref 0.7–1.3)
Glucose: 112 mg/dl (ref 70–140)
POTASSIUM: 4.3 meq/L (ref 3.5–5.1)
Sodium: 136 mEq/L (ref 136–145)
Total Bilirubin: 0.36 mg/dL (ref 0.20–1.20)
Total Protein: 6.9 g/dL (ref 6.4–8.3)

## 2015-12-12 LAB — CBC WITH DIFFERENTIAL/PLATELET
BASO%: 0.2 % (ref 0.0–2.0)
BASOS ABS: 0 10*3/uL (ref 0.0–0.1)
EOS%: 1.6 % (ref 0.0–7.0)
Eosinophils Absolute: 0.1 10*3/uL (ref 0.0–0.5)
HEMATOCRIT: 34.1 % — AB (ref 38.4–49.9)
HGB: 11.3 g/dL — ABNORMAL LOW (ref 13.0–17.1)
LYMPH#: 0.7 10*3/uL — AB (ref 0.9–3.3)
LYMPH%: 16.6 % (ref 14.0–49.0)
MCH: 33.4 pg (ref 27.2–33.4)
MCHC: 33.3 g/dL (ref 32.0–36.0)
MCV: 100.3 fL — ABNORMAL HIGH (ref 79.3–98.0)
MONO#: 0.3 10*3/uL (ref 0.1–0.9)
MONO%: 7.1 % (ref 0.0–14.0)
NEUT#: 3 10*3/uL (ref 1.5–6.5)
NEUT%: 74.5 % (ref 39.0–75.0)
PLATELETS: 201 10*3/uL (ref 140–400)
RBC: 3.4 10*6/uL — AB (ref 4.20–5.82)
RDW: 15.7 % — ABNORMAL HIGH (ref 11.0–14.6)
WBC: 4 10*3/uL (ref 4.0–10.3)

## 2015-12-13 ENCOUNTER — Telehealth: Payer: Self-pay | Admitting: *Deleted

## 2015-12-13 DIAGNOSIS — B20 Human immunodeficiency virus [HIV] disease: Secondary | ICD-10-CM

## 2015-12-13 MED ORDER — EFAVIRENZ-EMTRICITAB-TENOFOVIR 600-200-300 MG PO TABS: 1.0000 | ORAL_TABLET | Freq: Every day | ORAL | Status: DC

## 2015-12-13 NOTE — Telephone Encounter (Signed)
-----   Message from Thayer Headings, MD sent at 12/13/2015  3:43 PM EDT ----- I just realized after changing his Atripla to [place], that the [place] interacts with his chemo.  He can either just stick with Atripla or can use Tivicay and Descovy.  Likely just continue atripla and when he completes chemo, can start genvoya.  See phone note from [place] about it too.  Thanks

## 2015-12-13 NOTE — Assessment & Plan Note (Signed)
Currently undergoing chemo, finished radiation.

## 2015-12-13 NOTE — Assessment & Plan Note (Addendum)
He had been doing well but will restart and will change to [facility] for better side effect profile.  He is likely moving but if not, will follow up in the Fall.

## 2015-12-13 NOTE — Progress Notes (Signed)
   Subjective:    Patient ID: Jaime Morris, male    DOB: September 08, 1960, 55 y.o.   MRN: [id number]  HPI Mr. Guitron comes in for HIV.   He continues on Atripla and CD 4 of 530 with a continued undetectable viral load.  He has had no issues related to HIV.  He has completed chemotherapy and cranial radiation but relapsed and now on again.  Thinks he will be getting it through September then plans to move to [place] where his sister lives.  No issues with medication.  Had been on Atripla but since he did not follow up for more than 1 year, ran out.      Review of Systems  Constitutional: Negative for fever, chills, fatigue and unexpected weight change.  Gastrointestinal: Negative for nausea and diarrhea.  Skin: Negative for rash.  Neurological: Negative for dizziness.  Hematological: Negative for adenopathy.       Objective:   Physical Exam  Constitutional: He appears well-developed and well-nourished. No distress.  HENT:  Mouth/Throat: No oropharyngeal exudate.  Eyes: No scleral icterus.  Cardiovascular: Normal rate, regular rhythm and normal heart sounds.   No murmur heard. Pulmonary/Chest: Effort normal and breath sounds normal. No respiratory distress.  Lymphadenopathy:    He has no cervical adenopathy.  Skin: No rash noted.          Assessment & Plan:

## 2015-12-13 NOTE — Telephone Encounter (Signed)
Patient will continue Atripla through out chemo, anticipates completing treatment in September.  Prescription sent to local [facility] for him to pick up. Thanks!  Landis Gandy, RN

## 2015-12-18 ENCOUNTER — Ambulatory Visit: Payer: BLUE CROSS/BLUE SHIELD | Admitting: Internal Medicine

## 2015-12-19 ENCOUNTER — Ambulatory Visit (HOSPITAL_BASED_OUTPATIENT_CLINIC_OR_DEPARTMENT_OTHER): Payer: BLUE CROSS/BLUE SHIELD | Admitting: Internal Medicine

## 2015-12-19 ENCOUNTER — Other Ambulatory Visit (HOSPITAL_BASED_OUTPATIENT_CLINIC_OR_DEPARTMENT_OTHER): Payer: BLUE CROSS/BLUE SHIELD

## 2015-12-19 ENCOUNTER — Encounter: Payer: Self-pay | Admitting: *Deleted

## 2015-12-19 ENCOUNTER — Ambulatory Visit (HOSPITAL_BASED_OUTPATIENT_CLINIC_OR_DEPARTMENT_OTHER): Payer: BLUE CROSS/BLUE SHIELD

## 2015-12-19 ENCOUNTER — Encounter: Payer: Self-pay | Admitting: Internal Medicine

## 2015-12-19 VITALS — BP 128/73 | HR 100 | Temp 98.8°F | Resp 18 | Ht 69.0 in | Wt 142.0 lb

## 2015-12-19 DIAGNOSIS — I2699 Other pulmonary embolism without acute cor pulmonale: Secondary | ICD-10-CM

## 2015-12-19 DIAGNOSIS — C3491 Malignant neoplasm of unspecified part of right bronchus or lung: Secondary | ICD-10-CM | POA: Diagnosis not present

## 2015-12-19 DIAGNOSIS — Z5111 Encounter for antineoplastic chemotherapy: Secondary | ICD-10-CM | POA: Diagnosis not present

## 2015-12-19 DIAGNOSIS — C7951 Secondary malignant neoplasm of bone: Secondary | ICD-10-CM

## 2015-12-19 DIAGNOSIS — C3490 Malignant neoplasm of unspecified part of unspecified bronchus or lung: Secondary | ICD-10-CM

## 2015-12-19 DIAGNOSIS — R634 Abnormal weight loss: Secondary | ICD-10-CM

## 2015-12-19 DIAGNOSIS — C787 Secondary malignant neoplasm of liver and intrahepatic bile duct: Secondary | ICD-10-CM

## 2015-12-19 DIAGNOSIS — C3492 Malignant neoplasm of unspecified part of left bronchus or lung: Secondary | ICD-10-CM

## 2015-12-19 LAB — COMPREHENSIVE METABOLIC PANEL
ALT: 13 U/L (ref 0–55)
ANION GAP: 10 meq/L (ref 3–11)
AST: 19 U/L (ref 5–34)
Albumin: 3.3 g/dL — ABNORMAL LOW (ref 3.5–5.0)
Alkaline Phosphatase: 93 U/L (ref 40–150)
BUN: 5.3 mg/dL — ABNORMAL LOW (ref 7.0–26.0)
CALCIUM: 9.1 mg/dL (ref 8.4–10.4)
CHLORIDE: 103 meq/L (ref 98–109)
CO2: 24 mEq/L (ref 22–29)
CREATININE: 0.7 mg/dL (ref 0.7–1.3)
Glucose: 103 mg/dl (ref 70–140)
POTASSIUM: 4.4 meq/L (ref 3.5–5.1)
Sodium: 136 mEq/L (ref 136–145)
Total Bilirubin: <0.3 mg/dL (ref 0.20–1.20)
Total Protein: 6.7 g/dL (ref 6.4–8.3)

## 2015-12-19 LAB — CBC WITH DIFFERENTIAL/PLATELET
BASO%: 0.4 % (ref 0.0–2.0)
BASOS ABS: 0 10*3/uL (ref 0.0–0.1)
EOS ABS: 0.1 10*3/uL (ref 0.0–0.5)
EOS%: 6.3 % (ref 0.0–7.0)
HEMATOCRIT: 34.5 % — AB (ref 38.4–49.9)
HGB: 11.5 g/dL — ABNORMAL LOW (ref 13.0–17.1)
LYMPH#: 0.4 10*3/uL — AB (ref 0.9–3.3)
LYMPH%: 16.1 % (ref 14.0–49.0)
MCH: 33.1 pg (ref 27.2–33.4)
MCHC: 33.3 g/dL (ref 32.0–36.0)
MCV: 99.4 fL — AB (ref 79.3–98.0)
MONO#: 0.3 10*3/uL (ref 0.1–0.9)
MONO%: 14.7 % — ABNORMAL HIGH (ref 0.0–14.0)
NEUT#: 1.4 10*3/uL — ABNORMAL LOW (ref 1.5–6.5)
NEUT%: 62.5 % (ref 39.0–75.0)
PLATELETS: 185 10*3/uL (ref 140–400)
RBC: 3.47 10*6/uL — ABNORMAL LOW (ref 4.20–5.82)
RDW: 15.7 % — ABNORMAL HIGH (ref 11.0–14.6)
WBC: 2.2 10*3/uL — ABNORMAL LOW (ref 4.0–10.3)

## 2015-12-19 MED ORDER — IRINOTECAN HCL CHEMO INJECTION 100 MG/5ML
66.0000 mg/m2 | Freq: Once | INTRAVENOUS | Status: AC
  Administered 2015-12-19: 120 mg via INTRAVENOUS
  Filled 2015-12-19: qty 5

## 2015-12-19 MED ORDER — ATROPINE SULFATE 1 MG/ML IJ SOLN
INTRAMUSCULAR | Status: AC
  Filled 2015-12-19: qty 1

## 2015-12-19 MED ORDER — SODIUM CHLORIDE 0.9% FLUSH
10.0000 mL | INTRAVENOUS | Status: DC | PRN
  Administered 2015-12-19: 10 mL
  Filled 2015-12-19: qty 10

## 2015-12-19 MED ORDER — POTASSIUM CHLORIDE 2 MEQ/ML IV SOLN
Freq: Once | INTRAVENOUS | Status: AC
  Administered 2015-12-19: 11:00:00 via INTRAVENOUS
  Filled 2015-12-19: qty 10

## 2015-12-19 MED ORDER — SODIUM CHLORIDE 0.9 % IV SOLN
Freq: Once | INTRAVENOUS | Status: AC
  Administered 2015-12-19: 11:00:00 via INTRAVENOUS

## 2015-12-19 MED ORDER — PALONOSETRON HCL INJECTION 0.25 MG/5ML
INTRAVENOUS | Status: AC
  Filled 2015-12-19: qty 5

## 2015-12-19 MED ORDER — PALONOSETRON HCL INJECTION 0.25 MG/5ML
0.2500 mg | Freq: Once | INTRAVENOUS | Status: AC
  Administered 2015-12-19: 0.25 mg via INTRAVENOUS

## 2015-12-19 MED ORDER — SODIUM CHLORIDE 0.9 % IV SOLN
Freq: Once | INTRAVENOUS | Status: AC
  Administered 2015-12-19: 13:00:00 via INTRAVENOUS
  Filled 2015-12-19: qty 5

## 2015-12-19 MED ORDER — HEPARIN SOD (PORK) LOCK FLUSH 100 UNIT/ML IV SOLN
500.0000 [IU] | Freq: Once | INTRAVENOUS | Status: AC | PRN
  Administered 2015-12-19: 500 [IU]
  Filled 2015-12-19: qty 5

## 2015-12-19 MED ORDER — CISPLATIN CHEMO INJECTION 100MG/100ML
30.0000 mg/m2 | Freq: Once | INTRAVENOUS | Status: AC
  Administered 2015-12-19: 55 mg via INTRAVENOUS
  Filled 2015-12-19: qty 55

## 2015-12-19 MED ORDER — ATROPINE SULFATE 1 MG/ML IJ SOLN
0.5000 mg | Freq: Once | INTRAMUSCULAR | Status: AC | PRN
  Administered 2015-12-19: 0.5 mg via INTRAVENOUS

## 2015-12-19 MED ORDER — PROCHLORPERAZINE MALEATE 10 MG PO TABS: 10.0000 mg | ORAL_TABLET | Freq: Four times a day (QID) | ORAL | Status: AC | PRN

## 2015-12-19 NOTE — Progress Notes (Signed)
[place] [phone]   [phone]  OFFICE PROGRESS NOTE  Irven Shelling, MD [place] [address] [place] [address]  DIAGNOSIS:  1) Small cell carcinoma of lung, Extensive stage diagnosed in June 2015. 2) incidental diagnosis of pulmonary embolism on CT scan of the chest performed on 04/18/2014. Primary site: Lung (Right)  Staging method: AJCC 7th Edition  Clinical: Stage IV (T1a, N1, M1b) signed by Curt Bears, MD on 12/07/2013 3:19 PM  Summary: Stage IV (T1a, N1, M1b)   PRIOR THERAPY:  1)  Systemic chemotherapy with carboplatin for AUC of 5 given on day 1, etoposide 100 mg/m2 given on days 1, 2 and 3 with neulast support on day 4. Status post 6 cycles.  2) Lovenox 120 mg subcutaneously started 04/18/2014. 3) status post whole brain irradiation under the care of Dr. Sondra Come completed 06/21/2014. 4) systemic chemotherapy again with carboplatin for AUC of 5 on day 1 and etoposide 100 MG/M2 on days 1, 2 and 3 with Neulasta support on day 4. First cycle expected on 09/17/2015. Status post 2 cycles discontinued secondary to disease progression.  CURRENT THERAPY:  1) systemic chemotherapy with cisplatin 30 MG/M2 and irinotecan 65 MG/M2 on days 1 and 8 every 3 weeks. First dose 11/07/2015. Status post one cycle. 2) initial treatment with Xarelto 20 mg by mouth daily. First dose of treatment on 05/03/2014. This was switched on 10/01/2014 to Coumadin and followed by primary care physician.  DISEASE STAGE:  Small cell carcinoma of lung, Extensive stage  Primary site: Lung (Right)  Staging method: AJCC 7th Edition  Clinical: Stage IV (T1a, N1, M1b) signed by Curt Bears, MD on 12/07/2013 3:19 PM  Summary: Stage IV (T1a, N1, M1b)  CHEMOTHERAPY INTENT: pallative  CURRENT # OF CHEMOTHERAPY CYCLES: 3 CURRENT ANTIEMETICS: Zofran, dexamethasone and compazine  CURRENT SMOKING STATUS: Former smoker, quit 12/18/2013  ORAL CHEMOTHERAPY  AND CONSENT: n/a  CURRENT BISPHOSPHONATES USE: none  PAIN MANAGEMENT: Lortab 5/325 mg  NARCOTICS INDUCED CONSTIPATION: none  LIVING WILL AND CODE STATUS: ?   INTERVAL HISTORY: TEANCUM BRULE 55 y.o. male returns to the clinic today for followup visit accompanied by his friend. He is currently on systemic chemotherapy with reduced dose cisplatin and irinotecan on days 1 and 8 every 3 weeks. He is status post 2 cycles and tolerating his treatment well. He had few episodes of diarrhea. He denied having any significant chest pain, shortness of breath, cough or hemoptysis. He has no fever or chills. He has no nausea or vomiting. He is still in the process of moving to [facility]. He was referred to Dr. Stephan Minister at [facility] but he has not received the appointment yet.  MEDICAL HISTORY: Past Medical History  Diagnosis Date  . HIV infection ([place])     DR. [name]  . Hyperlipidemia   . Insomnia   . Lung nodule, multiple 10/24/13    CT CHEST W/O...DR. [name]  . Gynecomastia, male 10/16/13    MILD RIGHT SIDED PER RADIOLOGY STUDY  . COPD (chronic obstructive pulmonary disease) ([place])     PFT'S 10/26/13 @ [facility]  . History of depression   . History of pneumonia   . Collapsed lung     d/t biopsy, right  . Radiation 06/05/14-06/20/14    whole brain 25 gray  . Encounter for antineoplastic chemotherapy 10/08/2015  . Small cell lung cancer (HCC)     ALLERGIES:  is allergic  to lactose intolerance (gi).  MEDICATIONS:  Current Outpatient Prescriptions  Medication Sig Dispense Refill  . albuterol (PROAIR HFA) 108 (90 BASE) MCG/ACT inhaler Inhale 2 puffs into the lungs every 4 (four) hours as needed for wheezing or shortness of breath. Reported on 08/13/2015    . doxylamine, Sleep, (UNISOM) 25 MG tablet Take 50 mg by mouth at bedtime as needed for sleep.     Marland Kitchen dronabinol (MARINOL) 10 MG capsule TAKE ONE CAPSULE BY MOUTH TWICE DAILY BEFORE A MEAL 60 capsule 2  .  efavirenz-emtricitabine-tenofovir (ATRIPLA) 600-200-300 MG tablet Take 1 tablet by mouth daily. 30 tablet 3  . elvitegravir-cobicistat-emtricitabine-tenofovir (GENVOYA) 150-150-200-10 MG TABS tablet Take 1 tablet by mouth daily. 30 tablet 5  . lidocaine-prilocaine (EMLA) cream Apply 1 application topically as needed. 30 g 4  . loperamide (IMODIUM) 2 MG capsule Take by mouth as needed for diarrhea or loose stools. Reported on 12/05/2015    . Melatonin 10 MG TABS Take 10 mg by mouth at bedtime as needed (sleep).    . Multiple Vitamin (MULTIVITAMIN) tablet Take 1 tablet by mouth daily.    . Nutritional Supplements (HIGH-PROTEIN NUTRITIONAL SHAKE) LIQD Take 1 each by mouth 2 (two) times daily between meals.    Marland Kitchen oxyCODONE-acetaminophen (PERCOCET/ROXICET) 5-325 MG tablet Take 1 tablet by mouth every 6 (six) hours as needed for severe pain. 40 tablet 0  . prochlorperazine (COMPAZINE) 10 MG tablet Take 1 tablet (10 mg total) by mouth every 6 (six) hours as needed for nausea or vomiting. 30 tablet 1  . selenium sulfide (SELSUN) 2.5 % shampoo APP 1 APPLICATION ONCE DAILY TO SCALP FOR 10 MIN  THEN RINSE OFF  1  . tolterodine (DETROL LA) 4 MG 24 hr capsule take 1 capsule daily  5  . traZODone (DESYREL) 100 MG tablet Take 100 mg by mouth at bedtime.    . varenicline (CHANTIX) 1 MG tablet Take 1 mg by mouth 2 (two) times daily. Reported on 08/13/2015    . vitamin B-12 (CYANOCOBALAMIN) 500 MCG tablet Take 500 mcg by mouth daily.    Marland Kitchen warfarin (COUMADIN) 5 MG tablet Take 1 tablet (5 mg total) by mouth as directed. (Patient taking differently: Take 5 mg by mouth as directed. Take '5mg'$  on Monday and 2.'5mg'$  on other days) 30 tablet 0   No current facility-administered medications for this visit.   Facility-Administered Medications Ordered in Other Visits  Medication Dose Route Frequency Provider Last Rate Last Dose  . sodium chloride 0.9 % injection 10 mL  10 mL Intracatheter PRN Curt Bears, MD   10 mL at  01/30/14 1451    SURGICAL HISTORY:  Past Surgical History  Procedure Laterality Date  . Foot surgery      BUNIONECTOMY  . Back surgery      L 4-5 FUSION WITH SCREW  . Colonoscopy  UTD  . Portacath placement N/A 12/08/2013    Procedure: INSERTION PORT-A-CATH;  Surgeon: Grace Isaac, MD;  Location: [facility] OR;  Service: Thoracic;  Laterality: N/A;    REVIEW OF SYSTEMS:  A comprehensive review of systems was negative except for: Constitutional: positive for fatigue Gastrointestinal: positive for diarrhea and nausea   PHYSICAL EXAMINATION: General appearance: alert, cooperative, fatigued and no distress Head: Normocephalic, without obvious abnormality, atraumatic Neck: no adenopathy, no JVD, supple, symmetrical, trachea midline and thyroid not enlarged, symmetric, no tenderness/mass/nodules Lymph nodes: Cervical, supraclavicular, and axillary nodes normal. Resp: clear to auscultation bilaterally Back: symmetric, no curvature. ROM normal. No CVA  tenderness. Cardio: regular rate and rhythm, S1, S2 normal, no murmur, click, rub or gallop GI: soft, non-tender; bowel sounds normal; no masses,  no organomegaly Extremities: extremities normal, atraumatic, no cyanosis or edema Neurologic: Alert and oriented X 3, normal strength and tone. Normal symmetric reflexes. Normal coordination and gait  ECOG PERFORMANCE STATUS: 1 - Symptomatic but completely ambulatory  There were no vitals taken for this visit.  LABORATORY DATA: Lab Results  Component Value Date   WBC 2.2* 12/19/2015   HGB 11.5* 12/19/2015   HCT 34.5* 12/19/2015   MCV 99.4* 12/19/2015   PLT 185 12/19/2015      Chemistry      Component Value Date/Time   NA 136 12/12/2015 1039   NA 139 11/15/2015 0500   K 4.3 12/12/2015 1039   K 3.4* 11/15/2015 0500   CL 106 11/15/2015 0500   CO2 26 12/12/2015 1039   CO2 24 11/15/2015 0500   BUN 4.3* 12/12/2015 1039   BUN <5* 11/15/2015 0500   CREATININE 0.7 12/12/2015 1039    CREATININE 0.63 11/15/2015 0500   CREATININE 0.79 10/23/2013 1112      Component Value Date/Time   CALCIUM 9.8 12/12/2015 1039   CALCIUM 8.7* 11/15/2015 0500   ALKPHOS 82 12/12/2015 1039   ALKPHOS 138* 01/22/2014 0955   AST 14 12/12/2015 1039   AST 23 01/22/2014 0955   ALT 10 12/12/2015 1039   ALT 28 01/22/2014 0955   BILITOT 0.36 12/12/2015 1039   BILITOT 0.5 01/22/2014 0955       RADIOGRAPHIC STUDIES: No results found. ASSESSMENT AND PLAN:  This is a very pleasant 55 years old African American male recently diagnosed with:  1) Extensive stage small cell lung cancer: He is status post 6 cycles of systemic chemotherapy with carboplatin and etoposide. This was followed by prophylactic cranial irradiation.  He has been on observation for the last 18 months.  Unfortunately the recent CT scan of the chest, abdomen and pelvis showed evidence for disease progression including right paratracheal lymphadenopathy in addition to bilateral adrenal gland masses and new small liver lesion as well as bone lesion. He was started on second course of treatment with chemotherapy was carboplatin and etoposide status post 2 cycle. He tolerated his treatment well with no significant adverse effects except for fatigue. Unfortunately the recent CT scan of the chest, abdomen and pelvis showed evidence for further disease progression in the liver and bone. His treatment with carboplatin and etoposide was discontinued. The patient was started on second line treatment with reduced dose cisplatin and irinotecan on days 1 and 8 every 3 weeks. Status post 2 cycles. His total white blood count is low but the absolute neutrophil count is reasonable to continue with the treatment. He will proceed with cycle #3 today. He would come back for follow-up visit in 3 weeks for evaluation after repeating CT scan of the chest, abdomen and pelvis for restaging of his disease. The patient is also in the process of moving to [place] to be close to his family. I referred him to Dr. Lillia Abed, thoracic oncologist at [facility] for evaluation and further treatment options.  2) right lower lobe pulmonary embolus: He will continue his Coumadin treatment under the care of his primary care physician.  3) For the weight loss, I recommended for the patient to continue Marinol.   He was advised to call immediately if he has any concerning symptoms in the interval. The patient voices understanding of  current disease status and treatment options and is in agreement with the current care plan.  All questions were answered. The patient knows to call the clinic with any problems, questions or concerns. We can certainly see the patient much sooner if necessary.  Disclaimer: This note was dictated with voice recognition software. Similar sounding words can inadvertently be transcribed and may not be corrected upon review.       

## 2015-12-19 NOTE — Patient Instructions (Signed)
[facility] Discharge Instructions for Patients Receiving Chemotherapy  Today you received the following chemotherapy agents Irinotecan/Cisplatin  To help prevent nausea and vomiting after your treatment, we encourage you to take your nausea medication as prescribed.   If you develop nausea and vomiting that is not controlled by your nausea medication, call the clinic.   BELOW ARE SYMPTOMS THAT SHOULD BE REPORTED IMMEDIATELY:  *FEVER GREATER THAN 100.5 F  *CHILLS WITH OR WITHOUT FEVER  NAUSEA AND VOMITING THAT IS NOT CONTROLLED WITH YOUR NAUSEA MEDICATION  *UNUSUAL SHORTNESS OF BREATH  *UNUSUAL BRUISING OR BLEEDING  TENDERNESS IN MOUTH AND THROAT WITH OR WITHOUT PRESENCE OF ULCERS  *URINARY PROBLEMS  *BOWEL PROBLEMS  UNUSUAL RASH Items with * indicate a potential emergency and should be followed up as soon as possible.  Feel free to call the clinic you have any questions or concerns. The clinic phone number is [phone].  Please show the [place] at check-in to the Emergency Department and triage nurse.

## 2015-12-19 NOTE — Progress Notes (Signed)
Ok to treat with ANC 1.4 per Dr. Julien Nordmann.

## 2015-12-19 NOTE — Progress Notes (Signed)
Oncology Nurse Navigator Documentation  Oncology Nurse Navigator Flowsheets 12/19/2015  Navigator Encounter Type Other  Patient Visit Type MedOnc  Treatment Phase Treatment  Barriers/Navigation Needs Coordination of Care  Interventions Coordination of Care  Acuity Level 2  Acuity Level 2 Referrals such as genetics, survivorship  Time Spent with Patient 30   Patient was seen in med onc today.  He has not heard from [facility] about referral. I notified HIM department.  She was unaware of referral and will work on this today.

## 2015-12-21 ENCOUNTER — Other Ambulatory Visit: Payer: Self-pay | Admitting: Internal Medicine

## 2015-12-23 ENCOUNTER — Telehealth: Payer: Self-pay | Admitting: *Deleted

## 2015-12-23 NOTE — Telephone Encounter (Addendum)
"  I have a scheduling problem.  I'm scheduled for flush on the 26 th.  The scheduler should have scheduled this on the treatment day of the 27 th."   Asked if he wanted lab drawn from the arm or port-a-cath as the lab draw is why flush appointment scheduled.     "I want the flush appointment cancelled on the 26th and moved to the 27th.  This works by the flush nurse accesses before chemotherapy treatments."    No P.O.F entered with patient's request.  Returned call letting him know he does not need a flush that the chemotherapy nurse will acesss.

## 2015-12-24 NOTE — Progress Notes (Signed)
  Radiation Oncology         [phone] ________________________________  Name: Jaime Morris MRN: [id number]  Date: 12/09/2015  DOB: 1961/04/19  End of Treatment Note  Diagnosis:   Extensive stage small cell lung cancer, progressive spine lesions     Indication for treatment:  Palliative        Radiation treatment dates:  11/26/2015-12/09/2015  Site/dose:   The Sacrum was treated to 30 Gy in 10 fractions at 3 Gy per fraction.   Beams/energy:   3D // 10X  Narrative: The patient tolerated radiation treatment relatively well.   He reports diarrhea managed with imodium. He denies having pain or skin irritation in the treatment area.   Plan: The patient has completed radiation treatment. The patient will return to radiation oncology clinic for routine followup in one month. I advised them to call or return sooner if they have any questions or concerns related to their recovery or treatment.  -----------------------------------  Blair Promise, PhD, MD  This document serves as a record of services personally performed by Gery Pray, MD. It was created on his behalf by Arlyce Harman, a trained medical scribe. The creation of this record is based on the scribe's personal observations and the provider's statements to them. This document has been checked and approved by the attending provider.

## 2015-12-26 ENCOUNTER — Other Ambulatory Visit (HOSPITAL_BASED_OUTPATIENT_CLINIC_OR_DEPARTMENT_OTHER): Payer: BLUE CROSS/BLUE SHIELD

## 2015-12-26 ENCOUNTER — Ambulatory Visit: Payer: BLUE CROSS/BLUE SHIELD

## 2015-12-26 ENCOUNTER — Ambulatory Visit (HOSPITAL_BASED_OUTPATIENT_CLINIC_OR_DEPARTMENT_OTHER): Payer: BLUE CROSS/BLUE SHIELD

## 2015-12-26 VITALS — BP 136/92 | HR 99 | Temp 98.0°F | Resp 18

## 2015-12-26 DIAGNOSIS — C3490 Malignant neoplasm of unspecified part of unspecified bronchus or lung: Secondary | ICD-10-CM

## 2015-12-26 DIAGNOSIS — C7951 Secondary malignant neoplasm of bone: Secondary | ICD-10-CM

## 2015-12-26 DIAGNOSIS — C3491 Malignant neoplasm of unspecified part of right bronchus or lung: Secondary | ICD-10-CM

## 2015-12-26 DIAGNOSIS — Z5111 Encounter for antineoplastic chemotherapy: Secondary | ICD-10-CM | POA: Diagnosis not present

## 2015-12-26 DIAGNOSIS — C787 Secondary malignant neoplasm of liver and intrahepatic bile duct: Secondary | ICD-10-CM

## 2015-12-26 DIAGNOSIS — C3492 Malignant neoplasm of unspecified part of left bronchus or lung: Secondary | ICD-10-CM

## 2015-12-26 LAB — CBC WITH DIFFERENTIAL/PLATELET
BASO%: 0.6 % (ref 0.0–2.0)
BASOS ABS: 0 10*3/uL (ref 0.0–0.1)
EOS ABS: 0 10*3/uL (ref 0.0–0.5)
EOS%: 0.5 % (ref 0.0–7.0)
HCT: 34.6 % — ABNORMAL LOW (ref 38.4–49.9)
HEMOGLOBIN: 11.6 g/dL — AB (ref 13.0–17.1)
LYMPH%: 22.5 % (ref 14.0–49.0)
MCH: 33.4 pg (ref 27.2–33.4)
MCHC: 33.6 g/dL (ref 32.0–36.0)
MCV: 99.5 fL — AB (ref 79.3–98.0)
MONO#: 0.2 10*3/uL (ref 0.1–0.9)
MONO%: 8.1 % (ref 0.0–14.0)
NEUT#: 1.7 10*3/uL (ref 1.5–6.5)
NEUT%: 68.3 % (ref 39.0–75.0)
Platelets: 185 10*3/uL (ref 140–400)
RBC: 3.48 10*6/uL — AB (ref 4.20–5.82)
RDW: 16 % — AB (ref 11.0–14.6)
WBC: 2.5 10*3/uL — AB (ref 4.0–10.3)
lymph#: 0.6 10*3/uL — ABNORMAL LOW (ref 0.9–3.3)

## 2015-12-26 LAB — COMPREHENSIVE METABOLIC PANEL
ALBUMIN: 3.3 g/dL — AB (ref 3.5–5.0)
ALT: 15 U/L (ref 0–55)
AST: 21 U/L (ref 5–34)
Alkaline Phosphatase: 102 U/L (ref 40–150)
Anion Gap: 7 mEq/L (ref 3–11)
CHLORIDE: 105 meq/L (ref 98–109)
CO2: 24 meq/L (ref 22–29)
CREATININE: 0.7 mg/dL (ref 0.7–1.3)
Calcium: 9 mg/dL (ref 8.4–10.4)
GLUCOSE: 117 mg/dL (ref 70–140)
POTASSIUM: 4 meq/L (ref 3.5–5.1)
SODIUM: 137 meq/L (ref 136–145)
Total Bilirubin: <0.3 mg/dL (ref 0.20–1.20)
Total Protein: 6.6 g/dL (ref 6.4–8.3)

## 2015-12-26 LAB — MAGNESIUM: Magnesium: 2 mg/dl (ref 1.5–2.5)

## 2015-12-26 MED ORDER — ATROPINE SULFATE 1 MG/ML IJ SOLN
INTRAMUSCULAR | Status: AC
  Filled 2015-12-26: qty 1

## 2015-12-26 MED ORDER — SODIUM CHLORIDE 0.9 % IV SOLN
Freq: Once | INTRAVENOUS | Status: AC
  Administered 2015-12-26: 13:00:00 via INTRAVENOUS
  Filled 2015-12-26: qty 5

## 2015-12-26 MED ORDER — PALONOSETRON HCL INJECTION 0.25 MG/5ML
INTRAVENOUS | Status: AC
  Filled 2015-12-26: qty 5

## 2015-12-26 MED ORDER — SODIUM CHLORIDE 0.9 % IV SOLN
Freq: Once | INTRAVENOUS | Status: AC
  Administered 2015-12-26: 11:00:00 via INTRAVENOUS

## 2015-12-26 MED ORDER — POTASSIUM CHLORIDE 2 MEQ/ML IV SOLN
Freq: Once | INTRAVENOUS | Status: AC
  Administered 2015-12-26: 11:00:00 via INTRAVENOUS
  Filled 2015-12-26: qty 10

## 2015-12-26 MED ORDER — PALONOSETRON HCL INJECTION 0.25 MG/5ML
0.2500 mg | Freq: Once | INTRAVENOUS | Status: AC
  Administered 2015-12-26: 0.25 mg via INTRAVENOUS

## 2015-12-26 MED ORDER — SODIUM CHLORIDE 0.9% FLUSH
10.0000 mL | INTRAVENOUS | Status: DC | PRN
  Administered 2015-12-26: 10 mL
  Filled 2015-12-26: qty 10

## 2015-12-26 MED ORDER — ATROPINE SULFATE 1 MG/ML IJ SOLN
0.5000 mg | Freq: Once | INTRAMUSCULAR | Status: AC | PRN
  Administered 2015-12-26: 0.5 mg via INTRAVENOUS

## 2015-12-26 MED ORDER — HEPARIN SOD (PORK) LOCK FLUSH 100 UNIT/ML IV SOLN
500.0000 [IU] | Freq: Once | INTRAVENOUS | Status: AC | PRN
  Administered 2015-12-26: 500 [IU]
  Filled 2015-12-26: qty 5

## 2015-12-26 MED ORDER — SODIUM CHLORIDE 0.9 % IV SOLN
30.0000 mg/m2 | Freq: Once | INTRAVENOUS | Status: AC
  Administered 2015-12-26: 55 mg via INTRAVENOUS
  Filled 2015-12-26: qty 55

## 2015-12-26 MED ORDER — IRINOTECAN HCL CHEMO INJECTION 100 MG/5ML
66.0000 mg/m2 | Freq: Once | INTRAVENOUS | Status: AC
  Administered 2015-12-26: 120 mg via INTRAVENOUS
  Filled 2015-12-26: qty 5

## 2015-12-26 NOTE — Patient Instructions (Signed)
[facility] Discharge Instructions for Patients Receiving Chemotherapy  Today you received the following chemotherapy agents Irinotecan/Cisplatin  To help prevent nausea and vomiting after your treatment, we encourage you to take your nausea medication    If you develop nausea and vomiting that is not controlled by your nausea medication, call the clinic.   BELOW ARE SYMPTOMS THAT SHOULD BE REPORTED IMMEDIATELY:  *FEVER GREATER THAN 100.5 F  *CHILLS WITH OR WITHOUT FEVER  NAUSEA AND VOMITING THAT IS NOT CONTROLLED WITH YOUR NAUSEA MEDICATION  *UNUSUAL SHORTNESS OF BREATH  *UNUSUAL BRUISING OR BLEEDING  TENDERNESS IN MOUTH AND THROAT WITH OR WITHOUT PRESENCE OF ULCERS  *URINARY PROBLEMS  *BOWEL PROBLEMS  UNUSUAL RASH Items with * indicate a potential emergency and should be followed up as soon as possible.  Feel free to call the clinic you have any questions or concerns. The clinic phone number is [phone].  Please show the CHEMO ALERT CARD at check-in to the Emergency Department and triage nurse.   

## 2015-12-31 ENCOUNTER — Encounter (HOSPITAL_COMMUNITY): Payer: Self-pay

## 2015-12-31 ENCOUNTER — Ambulatory Visit (HOSPITAL_COMMUNITY)
Admission: RE | Admit: 2015-12-31 | Discharge: 2015-12-31 | Disposition: A | Discharge status: Home / Self Care | Payer: BLUE CROSS/BLUE SHIELD | Source: Ambulatory Visit | Attending: Internal Medicine | Admitting: Internal Medicine

## 2015-12-31 DIAGNOSIS — Z87891 Personal history of nicotine dependence: Secondary | ICD-10-CM | POA: Insufficient documentation

## 2015-12-31 DIAGNOSIS — M899 Disorder of bone, unspecified: Secondary | ICD-10-CM | POA: Insufficient documentation

## 2015-12-31 DIAGNOSIS — K769 Liver disease, unspecified: Secondary | ICD-10-CM | POA: Diagnosis not present

## 2015-12-31 DIAGNOSIS — R590 Localized enlarged lymph nodes: Secondary | ICD-10-CM | POA: Insufficient documentation

## 2015-12-31 DIAGNOSIS — C3490 Malignant neoplasm of unspecified part of unspecified bronchus or lung: Secondary | ICD-10-CM | POA: Diagnosis present

## 2015-12-31 DIAGNOSIS — C7972 Secondary malignant neoplasm of left adrenal gland: Secondary | ICD-10-CM | POA: Diagnosis not present

## 2015-12-31 DIAGNOSIS — C7971 Secondary malignant neoplasm of right adrenal gland: Secondary | ICD-10-CM | POA: Diagnosis not present

## 2015-12-31 DIAGNOSIS — Z5111 Encounter for antineoplastic chemotherapy: Secondary | ICD-10-CM

## 2015-12-31 MED ORDER — IOPAMIDOL (ISOVUE-300) INJECTION 61%
100.0000 mL | Freq: Once | INTRAVENOUS | Status: AC | PRN
  Administered 2015-12-31: 100 mL via INTRAVENOUS

## 2016-01-01 ENCOUNTER — Telehealth: Payer: Self-pay | Admitting: Internal Medicine

## 2016-01-01 NOTE — Telephone Encounter (Signed)
Faxed pt medical records to Dr Clarita Leber [phone]

## 2016-01-02 ENCOUNTER — Other Ambulatory Visit (HOSPITAL_BASED_OUTPATIENT_CLINIC_OR_DEPARTMENT_OTHER): Payer: BLUE CROSS/BLUE SHIELD

## 2016-01-02 DIAGNOSIS — C3491 Malignant neoplasm of unspecified part of right bronchus or lung: Secondary | ICD-10-CM

## 2016-01-02 DIAGNOSIS — C787 Secondary malignant neoplasm of liver and intrahepatic bile duct: Secondary | ICD-10-CM

## 2016-01-02 LAB — CBC WITH DIFFERENTIAL/PLATELET
BASO%: 0.4 % (ref 0.0–2.0)
BASOS ABS: 0 10*3/uL (ref 0.0–0.1)
EOS ABS: 0 10*3/uL (ref 0.0–0.5)
EOS%: 0.4 % (ref 0.0–7.0)
HCT: 35 % — ABNORMAL LOW (ref 38.4–49.9)
HEMOGLOBIN: 11.9 g/dL — AB (ref 13.0–17.1)
LYMPH%: 25.2 % (ref 14.0–49.0)
MCH: 33 pg (ref 27.2–33.4)
MCHC: 34 g/dL (ref 32.0–36.0)
MCV: 97 fL (ref 79.3–98.0)
MONO#: 0.3 10*3/uL (ref 0.1–0.9)
MONO%: 10.1 % (ref 0.0–14.0)
NEUT#: 1.8 10*3/uL (ref 1.5–6.5)
NEUT%: 63.9 % (ref 39.0–75.0)
Platelets: 176 10*3/uL (ref 140–400)
RBC: 3.61 10*6/uL — AB (ref 4.20–5.82)
RDW: 15.2 % — ABNORMAL HIGH (ref 11.0–14.6)
WBC: 2.8 10*3/uL — ABNORMAL LOW (ref 4.0–10.3)
lymph#: 0.7 10*3/uL — ABNORMAL LOW (ref 0.9–3.3)

## 2016-01-02 LAB — COMPREHENSIVE METABOLIC PANEL
ALT: 18 U/L (ref 0–55)
AST: 24 U/L (ref 5–34)
Albumin: 3.4 g/dL — ABNORMAL LOW (ref 3.5–5.0)
Alkaline Phosphatase: 105 U/L (ref 40–150)
Anion Gap: 9 mEq/L (ref 3–11)
BUN: 5.6 mg/dL — AB (ref 7.0–26.0)
CHLORIDE: 102 meq/L (ref 98–109)
CO2: 27 meq/L (ref 22–29)
Calcium: 9.4 mg/dL (ref 8.4–10.4)
Creatinine: 0.8 mg/dL (ref 0.7–1.3)
EGFR: >90 mL/min/{1.73_m2} (ref >90)
GLUCOSE: 125 mg/dL (ref 70–140)
POTASSIUM: 4.9 meq/L (ref 3.5–5.1)
SODIUM: 138 meq/L (ref 136–145)
Total Bilirubin: <0.3 mg/dL (ref 0.20–1.20)
Total Protein: 7 g/dL (ref 6.4–8.3)

## 2016-01-02 IMAGING — MR MR HEAD WO/W CM
11 of 13 series · 36 of 48 positions shown · IV contrast (multihance)
Comparison: None.

CLINICAL DATA: Lung lesion rule out metastatic disease

EXAM:
MRI HEAD WITHOUT AND WITH CONTRAST
TECHNIQUE: Multiplanar, multiecho pulse sequences of the brain and surrounding
structures were obtained without and with intravenous contrast.
CONTRAST:  17mL MULTIHANCE GADOBENATE DIMEGLUMINE 529 MG/ML IV SOLN

[Series 2: T1 · sagittal · 5.0mm · 0.45mm/px · 2 of 19 slices shown (1 of 2)]
[im 1/19]
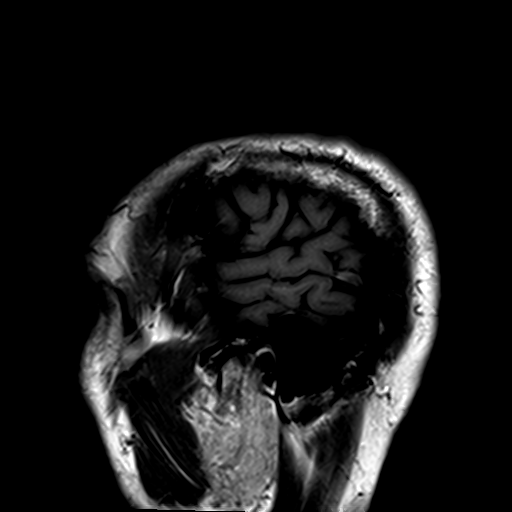
[im 19/19]
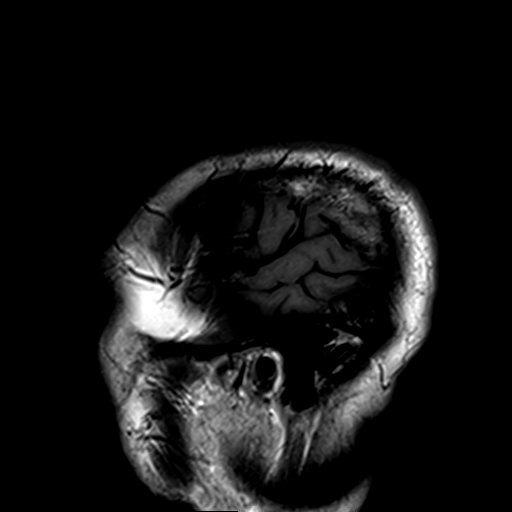

[Series 3: T2 · axial · 5.0mm · 0.45mm/px · z∈[-70,+72]mm · 2 of 24 slices shown (1 of 2)]
[im 1/24]
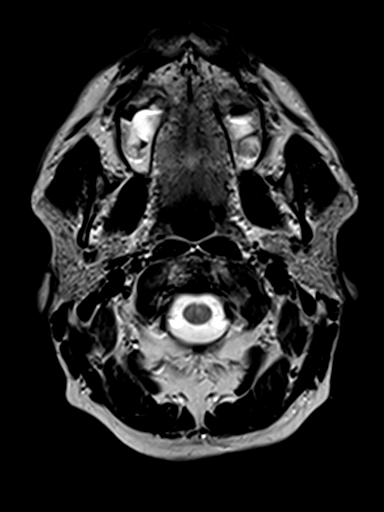
[im 24/24]
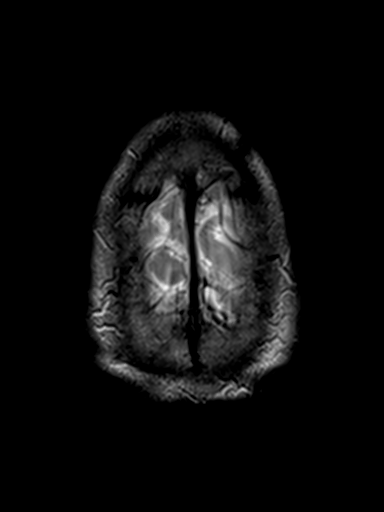

[Series 4: DWI · axial · 5.0mm · 0.90mm/px · z∈[-70,+72]mm · 4 of 48 slices shown (1 of 2)]
[im 1/48]
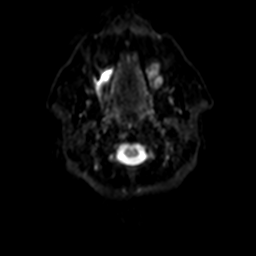
[im 16/48]
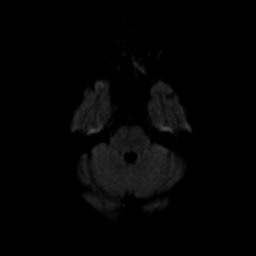
[im 32/48]
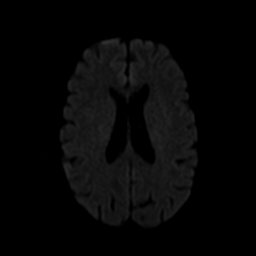
[im 48/48]
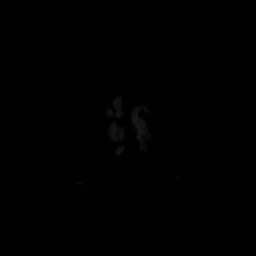

[Series 5: dwi_adc · axial · 5.0mm · 0.90mm/px · z∈[-70,+72]mm · 2 of 24 slices shown]
[im 1/24]
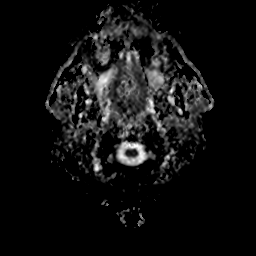
[im 24/24]
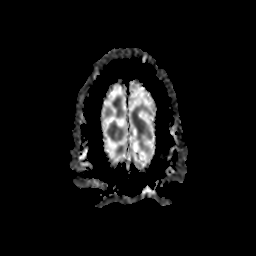

[Series 6: DWI · coronal · 5.0mm · 0.90mm/px · 6 of 68 slices shown (2 of 2)]
[im 1/68]
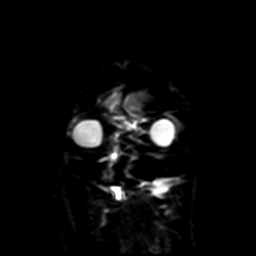
[im 14/68]
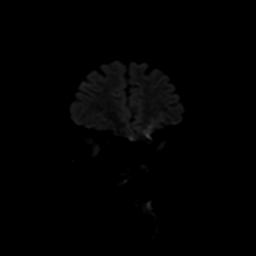
[im 27/68]
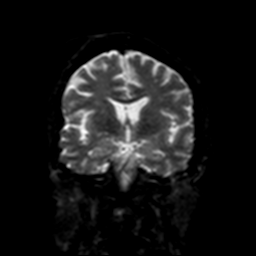
[im 41/68]
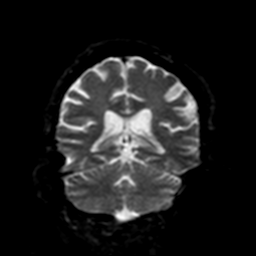
[im 54/68]
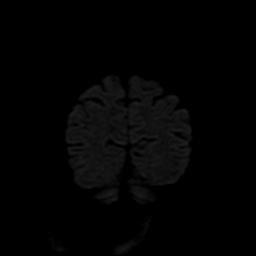
[im 68/68]
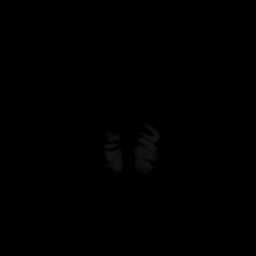

[Series 7: cor dwi_adc · coronal · 5.0mm · 0.90mm/px · 3 of 33 slices shown]
[im 1/33]
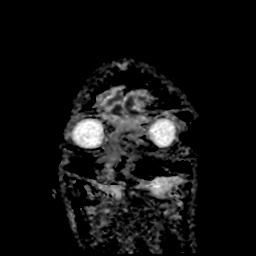
[im 17/33]
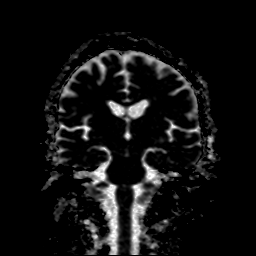
[im 33/33]
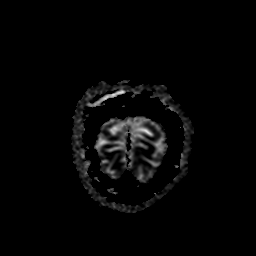

[Series 9: axial (person_name)1 volume · axial · 2.0mm · 0.45mm/px · z∈[-77,+79]mm · 7 of 80 slices shown]
[im 1/80]
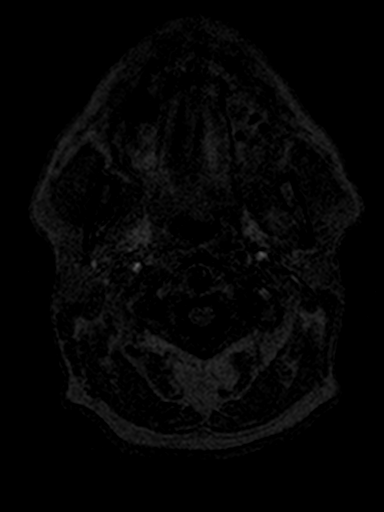
[im 14/80]
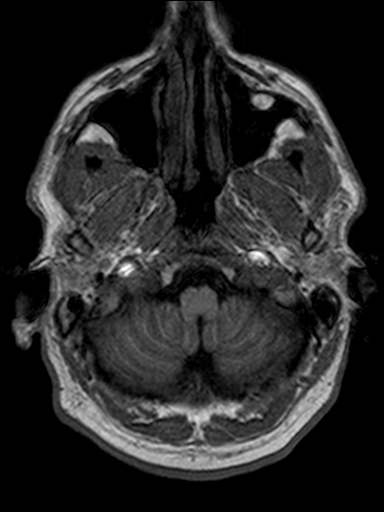
[im 27/80]
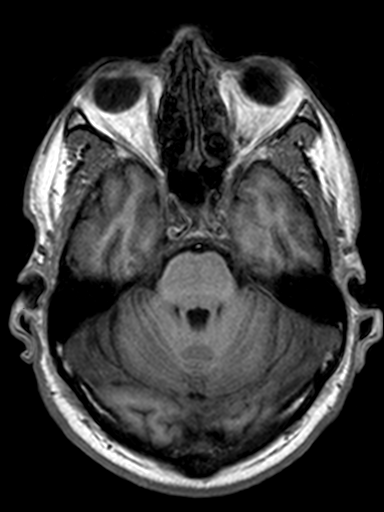
[im 40/80]
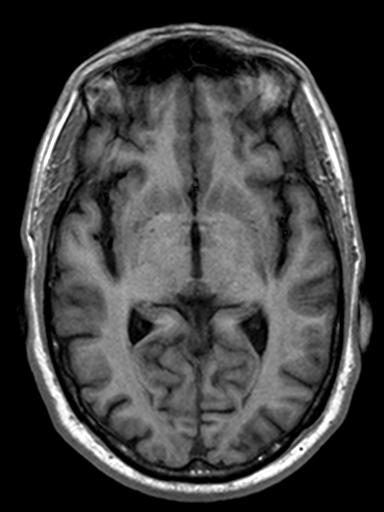
[im 53/80]
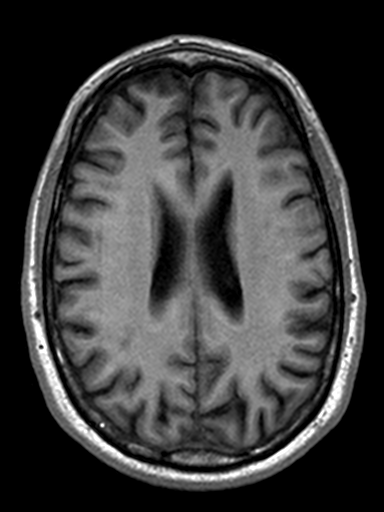
[im 66/80]
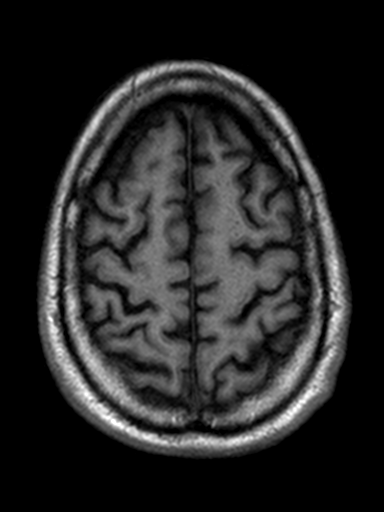
[im 80/80]
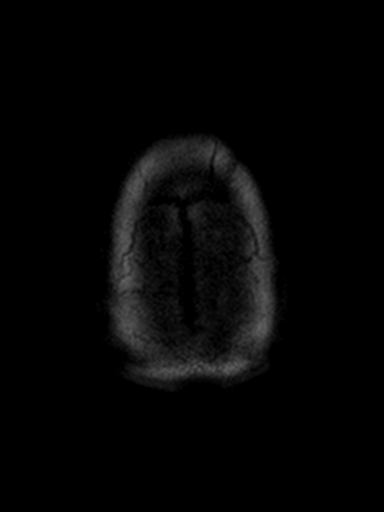

[Series 10: FLAIR · axial · 5.0mm · 0.45mm/px · z∈[-70,+72]mm · 2 of 24 slices shown]
[im 1/24]
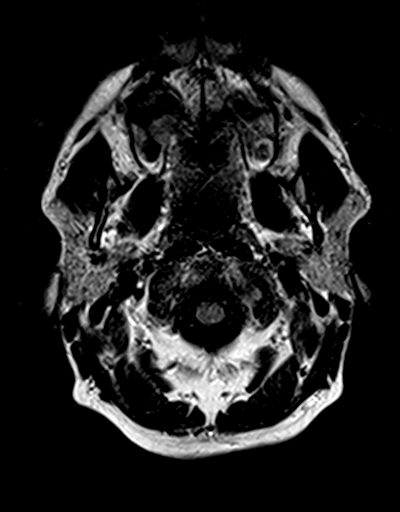
[im 24/24]
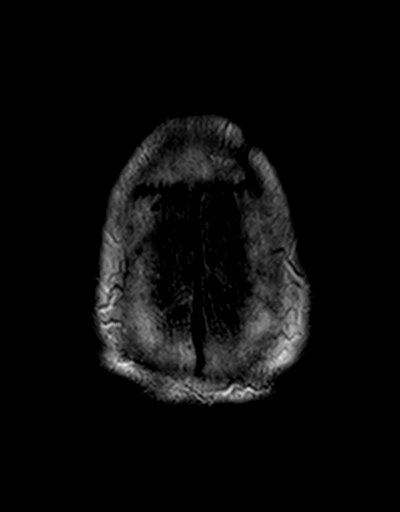

[Series 12: swi_images · axial · 2.0mm · 0.90mm/px · z∈[-77,+0]mm · 4 of 80 slices shown]
[im 1/80]
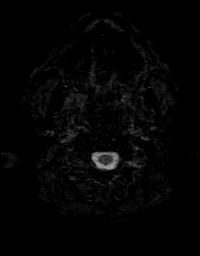
[im 14/80]
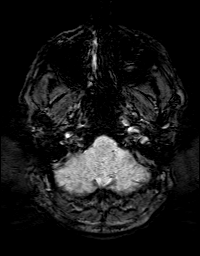
[im 27/80]
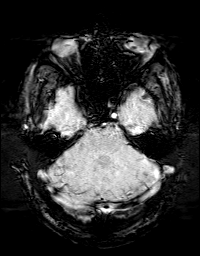
[im 40/80]
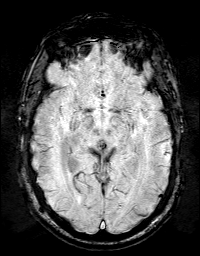

[Series 13: T2 · coronal · 5.0mm · 0.45mm/px · 2 of 26 slices shown (2 of 2)]
[im 1/26]
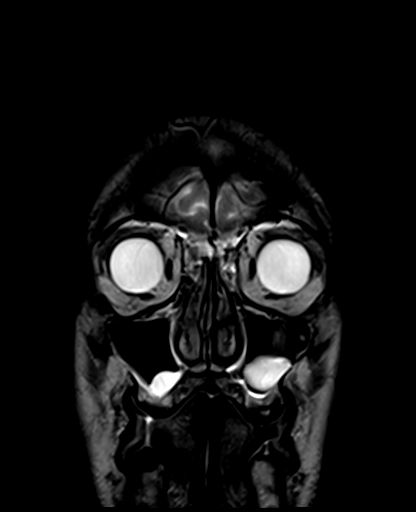
[im 26/26]
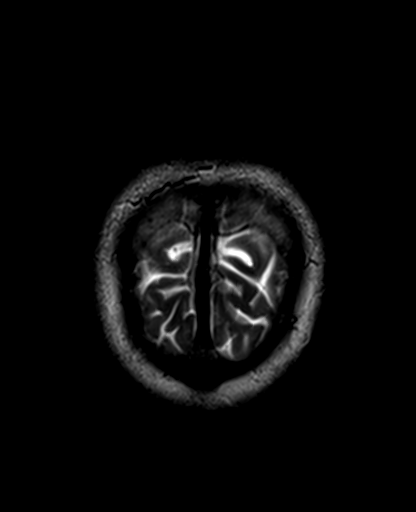

[Series 16: T1 · sagittal · 5.0mm · 0.45mm/px · 2 of 19 slices shown (2 of 2)]
[im 1/19]
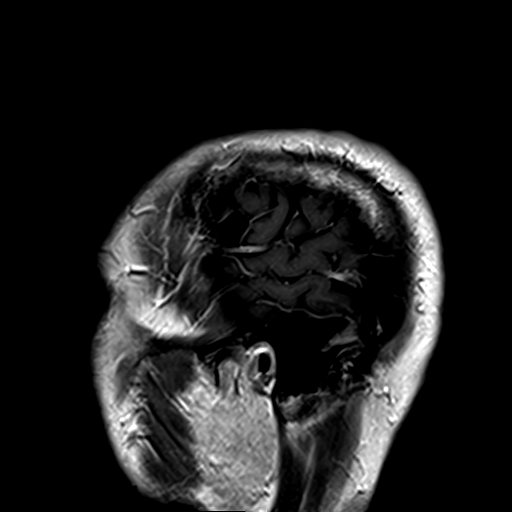
[im 19/19]
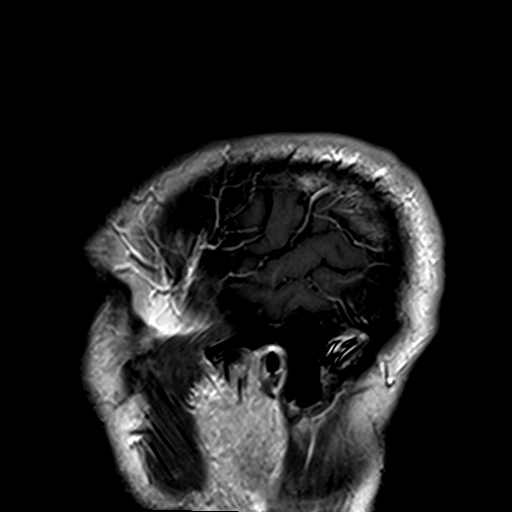

[36 of 48 positions shown; findings below may reference images not displayed]

FINDINGS: Ventricle size is normal. Cerebral volume normal for age. Pituitary
normal in size.

Scattered small nonenhancing white matter hyperintensities,
consistent with chronic microvascular ischemia. Negative for acute
infarct.

Negative for hemorrhage or mass.  No edema or mass effect.

Postcontrast images reveal normal enhancement. No enhancing mass or
leptomeningeal thickening is identified postcontrast.

Sinusitis with mucosal edema throughout the paranasal sinuses.
Air-fluid level left sphenoid sinus.
IMPRESSION: Negative for metastatic disease.

Sinusitis with air-fluid level left sphenoid sinus

## 2016-01-08 ENCOUNTER — Telehealth: Payer: Self-pay | Admitting: *Deleted

## 2016-01-08 ENCOUNTER — Other Ambulatory Visit (HOSPITAL_BASED_OUTPATIENT_CLINIC_OR_DEPARTMENT_OTHER): Payer: BLUE CROSS/BLUE SHIELD

## 2016-01-08 ENCOUNTER — Ambulatory Visit (HOSPITAL_BASED_OUTPATIENT_CLINIC_OR_DEPARTMENT_OTHER): Payer: BLUE CROSS/BLUE SHIELD | Admitting: Internal Medicine

## 2016-01-08 ENCOUNTER — Telehealth: Payer: Self-pay | Admitting: Internal Medicine

## 2016-01-08 ENCOUNTER — Encounter: Payer: Self-pay | Admitting: Internal Medicine

## 2016-01-08 ENCOUNTER — Ambulatory Visit: Payer: BLUE CROSS/BLUE SHIELD | Admitting: Internal Medicine

## 2016-01-08 ENCOUNTER — Other Ambulatory Visit: Payer: BLUE CROSS/BLUE SHIELD

## 2016-01-08 VITALS — BP 135/98 | HR 107 | Temp 98.3°F | Resp 18 | Ht 69.0 in | Wt 141.0 lb

## 2016-01-08 DIAGNOSIS — C3491 Malignant neoplasm of unspecified part of right bronchus or lung: Secondary | ICD-10-CM

## 2016-01-08 DIAGNOSIS — C787 Secondary malignant neoplasm of liver and intrahepatic bile duct: Secondary | ICD-10-CM | POA: Diagnosis not present

## 2016-01-08 DIAGNOSIS — Z5111 Encounter for antineoplastic chemotherapy: Secondary | ICD-10-CM

## 2016-01-08 DIAGNOSIS — C3490 Malignant neoplasm of unspecified part of unspecified bronchus or lung: Secondary | ICD-10-CM

## 2016-01-08 DIAGNOSIS — I2699 Other pulmonary embolism without acute cor pulmonale: Secondary | ICD-10-CM | POA: Diagnosis not present

## 2016-01-08 DIAGNOSIS — B20 Human immunodeficiency virus [HIV] disease: Secondary | ICD-10-CM

## 2016-01-08 LAB — COMPREHENSIVE METABOLIC PANEL
ALT: 19 U/L (ref 0–55)
ANION GAP: 10 meq/L (ref 3–11)
AST: 26 U/L (ref 5–34)
Albumin: 3.5 g/dL (ref 3.5–5.0)
Alkaline Phosphatase: 114 U/L (ref 40–150)
BUN: <4 mg/dL — ABNORMAL LOW (ref 7.0–26.0)
CALCIUM: 9.4 mg/dL (ref 8.4–10.4)
CHLORIDE: 104 meq/L (ref 98–109)
CO2: 26 meq/L (ref 22–29)
CREATININE: 0.8 mg/dL (ref 0.7–1.3)
Glucose: 123 mg/dl (ref 70–140)
POTASSIUM: 4.6 meq/L (ref 3.5–5.1)
Sodium: 139 mEq/L (ref 136–145)
Total Bilirubin: 0.34 mg/dL (ref 0.20–1.20)
Total Protein: 6.8 g/dL (ref 6.4–8.3)

## 2016-01-08 LAB — CBC WITH DIFFERENTIAL/PLATELET
BASO%: 0 % (ref 0.0–2.0)
Basophils Absolute: 0 10*3/uL (ref 0.0–0.1)
EOS ABS: 0 10*3/uL (ref 0.0–0.5)
EOS%: 1.7 % (ref 0.0–7.0)
HEMATOCRIT: 34.7 % — AB (ref 38.4–49.9)
HGB: 11.9 g/dL — ABNORMAL LOW (ref 13.0–17.1)
LYMPH#: 0.7 10*3/uL — AB (ref 0.9–3.3)
LYMPH%: 28.9 % (ref 14.0–49.0)
MCH: 33.2 pg (ref 27.2–33.4)
MCHC: 34.3 g/dL (ref 32.0–36.0)
MCV: 96.9 fL (ref 79.3–98.0)
MONO#: 0.4 10*3/uL (ref 0.1–0.9)
MONO%: 15.9 % — ABNORMAL HIGH (ref 0.0–14.0)
NEUT#: 1.2 10*3/uL — ABNORMAL LOW (ref 1.5–6.5)
NEUT%: 53.5 % (ref 39.0–75.0)
PLATELETS: 147 10*3/uL (ref 140–400)
RBC: 3.58 10*6/uL — AB (ref 4.20–5.82)
RDW: 16.7 % — ABNORMAL HIGH (ref 11.0–14.6)
WBC: 2.3 10*3/uL — ABNORMAL LOW (ref 4.0–10.3)

## 2016-01-08 NOTE — Progress Notes (Signed)
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 2) incidental diagnosis of pulmonary embolism on CT scan of the chest performed on 04/18/2014. Primary site: Lung (Right)  Staging method: AJCC 7th Edition  Clinical: Stage IV (T1a, N1, M1b) signed by Curt Bears, MD on 12/07/2013 3:19 PM  Summary: Stage IV (T1a, N1, M1b)   PRIOR THERAPY:  1)  Systemic chemotherapy with carboplatin for AUC of 5 given on day 1, etoposide 100 mg/m2 given on days 1, 2 and 3 with neulast support on day 4. Status post 6 cycles.  2) Lovenox 120 mg subcutaneously started 04/18/2014. 3) status post whole brain irradiation under the care of Dr. Sondra Come completed 06/21/2014. 4) systemic chemotherapy again with carboplatin for AUC of 5 on day 1 and etoposide 100 MG/M2 on days 1, 2 and 3 with Neulasta support on day 4. First cycle expected on 09/17/2015. Status post 2 cycles discontinued secondary to disease progression.  CURRENT THERAPY:  1) systemic chemotherapy with cisplatin 30 MG/M2 and irinotecan 65 MG/M2 on days 1 and 8 every 3 weeks. First dose 11/07/2015. Status post 3 cycles. 2) initial treatment with Xarelto 20 mg by mouth daily. First dose of treatment on 05/03/2014. This was switched on 10/01/2014 to Coumadin and followed by primary care physician.  DISEASE STAGE:  Small cell carcinoma of lung, Extensive stage  Primary site: Lung (Right)  Staging method: AJCC 7th Edition  Clinical: Stage IV (T1a, N1, M1b) signed by Curt Bears, MD on 12/07/2013 3:19 PM  Summary: Stage IV (T1a, N1, M1b)  CHEMOTHERAPY INTENT: pallative  CURRENT # OF CHEMOTHERAPY CYCLES: 3 CURRENT ANTIEMETICS: Zofran, dexamethasone and compazine  CURRENT SMOKING STATUS: Former smoker, quit 12/18/2013  ORAL CHEMOTHERAPY AND  CONSENT: n/a  CURRENT BISPHOSPHONATES USE: none  PAIN MANAGEMENT: Lortab 5/325 mg  NARCOTICS INDUCED CONSTIPATION: none  LIVING WILL AND CODE STATUS: ?   INTERVAL HISTORY: Jaime Morris 55 y.o. male returns to the clinic today for followup visit. He is currently on systemic chemotherapy with reduced dose cisplatin and irinotecan on days 1 and 8 every 3 weeks. He is status post 3 cycles and tolerating his treatment well except for few episodes of diarrhea. He denied having any significant chest pain, shortness of breath, cough or hemoptysis. He has no fever or chills. He has no nausea or vomiting. He is still in the process of moving to [facility]. He was referred to Dr. Stephan Minister at [facility]. He has an appointment with him on 01/20/2016. The patient had repeat CT scan of the chest, abdomen and pelvis performed recently and he is here for evaluation and discussion of his scan results.  MEDICAL HISTORY: Past Medical History:  Diagnosis Date  . Collapsed lung    d/t biopsy, right  . COPD (chronic obstructive pulmonary disease) ([place])    PFT'S 10/26/13 @ [place]  . Encounter for antineoplastic chemotherapy 10/08/2015  . Gynecomastia, male 10/16/13   MILD RIGHT SIDED PER RADIOLOGY STUDY  . History of depression   . History of pneumonia   . HIV infection ([place])    DR. [name]  . Hyperlipidemia   . Insomnia   . Lung nodule, multiple 10/24/13   CT CHEST W/O...DR. [name]  . Radiation 06/05/14-06/20/14   whole brain 25  gray  . Small cell lung cancer ([facility])     ALLERGIES:  is allergic to lactose intolerance (gi).  MEDICATIONS:  Current Outpatient Prescriptions  Medication Sig Dispense Refill  . albuterol (PROAIR HFA) 108 (90 BASE) MCG/ACT inhaler Inhale 2 puffs into the lungs every 4 (four) hours as needed for wheezing or shortness of breath. Reported on 08/13/2015    . doxylamine, Sleep, (UNISOM) 25 MG tablet Take 50 mg by mouth at bedtime as needed for sleep.  Reported on 12/19/2015    . dronabinol (MARINOL) 10 MG capsule TAKE ONE CAPSULE BY MOUTH TWICE DAILY BEFORE A MEAL 60 capsule 2  . efavirenz-emtricitabine-tenofovir (ATRIPLA) 600-200-300 MG tablet Take 1 tablet by mouth daily. 30 tablet 3  . elvitegravir-cobicistat-emtricitabine-tenofovir (GENVOYA) 150-150-200-10 MG TABS tablet Take 1 tablet by mouth daily. 30 tablet 5  . lidocaine-prilocaine (EMLA) cream Apply 1 application topically as needed. 30 g 4  . loperamide (IMODIUM) 2 MG capsule Take by mouth as needed for diarrhea or loose stools. Reported on 12/05/2015    . Melatonin 10 MG TABS Take 10 mg by mouth at bedtime as needed (sleep).    . Multiple Vitamin (MULTIVITAMIN) tablet Take 1 tablet by mouth daily.    . Nutritional Supplements (HIGH-PROTEIN NUTRITIONAL SHAKE) LIQD Take 1 each by mouth 2 (two) times daily between meals.    Marland Kitchen oxyCODONE-acetaminophen (PERCOCET/ROXICET) 5-325 MG tablet Take 1 tablet by mouth every 6 (six) hours as needed for severe pain. 40 tablet 0  . prochlorperazine (COMPAZINE) 10 MG tablet Take 1 tablet (10 mg total) by mouth every 6 (six) hours as needed for nausea or vomiting. 30 tablet 1  . selenium sulfide (SELSUN) 2.5 % shampoo Reported on 12/19/2015  1  . tolterodine (DETROL LA) 4 MG 24 hr capsule take 1 capsule daily  5  . traZODone (DESYREL) 100 MG tablet Take 100 mg by mouth at bedtime.    . varenicline (CHANTIX) 1 MG tablet Take 1 mg by mouth 2 (two) times daily. Reported on 08/13/2015    . vitamin B-12 (CYANOCOBALAMIN) 500 MCG tablet Take 500 mcg by mouth daily.    Marland Kitchen warfarin (COUMADIN) 5 MG tablet TAKE 1 TABLET BY MOUTH AS DIRECTED 30 tablet 0   No current facility-administered medications for this visit.    Facility-Administered Medications Ordered in Other Visits  Medication Dose Route Frequency Provider Last Rate Last Dose  . sodium chloride 0.9 % injection 10 mL  10 mL Intracatheter PRN Curt Bears, MD   10 mL at 01/30/14 1451    SURGICAL HISTORY:    Past Surgical History:  Procedure Laterality Date  . BACK SURGERY     L 4-5 FUSION WITH SCREW  . COLONOSCOPY  UTD  . FOOT SURGERY     BUNIONECTOMY  . PORTACATH PLACEMENT N/A 12/08/2013   Procedure: INSERTION PORT-A-CATH;  Surgeon: Grace Isaac, MD;  Location: [facility];  Service: Thoracic;  Laterality: N/A;    REVIEW OF SYSTEMS:  Constitutional: positive for fatigue and weight loss Eyes: negative Ears, nose, mouth, throat, and face: negative Respiratory: negative Cardiovascular: negative Gastrointestinal: negative Genitourinary:negative Integument/breast: negative Hematologic/lymphatic: negative Musculoskeletal:negative Neurological: negative Behavioral/Psych: negative Endocrine: negative Allergic/Immunologic: negative   PHYSICAL EXAMINATION: General appearance: alert, cooperative, fatigued and no distress Head: Normocephalic, without obvious abnormality, atraumatic Neck: no adenopathy, no JVD, supple, symmetrical, trachea midline and thyroid not enlarged, symmetric, no tenderness/mass/nodules Lymph nodes: Cervical, supraclavicular, and axillary nodes normal. Resp: clear to auscultation bilaterally Back: symmetric, no curvature. ROM normal. No  CVA tenderness. Cardio: regular rate and rhythm, S1, S2 normal, no murmur, click, rub or gallop GI: soft, non-tender; bowel sounds normal; no masses,  no organomegaly Extremities: extremities normal, atraumatic, no cyanosis or edema Neurologic: Alert and oriented X 3, normal strength and tone. Normal symmetric reflexes. Normal coordination and gait  ECOG PERFORMANCE STATUS: 1 - Symptomatic but completely ambulatory  Blood pressure (!) 135/98, pulse (!) 107, temperature 98.3 F (36.8 C), temperature source Oral, resp. rate 18, height '5\' 9"'$  (1.753 m), weight 141 lb (64 kg), SpO2 100 %.  LABORATORY DATA: Lab Results  Component Value Date   WBC 2.3 (L) 01/08/2016   HGB 11.9 (L) 01/08/2016   HCT 34.7 (L) 01/08/2016   MCV 96.9  01/08/2016   PLT 147 01/08/2016      Chemistry      Component Value Date/Time   NA 139 01/08/2016 0928   K 4.6 01/08/2016 0928   CL 106 11/15/2015 0500   CO2 26 01/08/2016 0928   BUN <4.0 (L) 01/08/2016 0928   CREATININE 0.8 01/08/2016 0928      Component Value Date/Time   CALCIUM 9.4 01/08/2016 0928   ALKPHOS 114 01/08/2016 0928   AST 26 01/08/2016 0928   ALT 19 01/08/2016 0928   BILITOT 0.34 01/08/2016 0928       RADIOGRAPHIC STUDIES: Ct Chest W Contrast  Result Date: 12/31/2015 CLINICAL DATA:  55 year old male with history of non-small-cell carcinoma diagnosed in June 2015. Prior history of chemotherapy and radiation therapy, formally completed. Currently undergoing another round of chemotherapy. History of COPD ( 40 pack-year history of smoking). EXAM: CT CHEST, ABDOMEN, AND PELVIS WITH CONTRAST TECHNIQUE: Multidetector CT imaging of the chest, abdomen and pelvis was performed following the standard protocol during bolus administration of intravenous contrast. CONTRAST:  121m ISOVUE-300 IOPAMIDOL (ISOVUE-300) INJECTION 61% COMPARISON:  CT the chest, abdomen and pelvis 10/28/2015. FINDINGS: CT CHEST FINDINGS Cardiovascular: Heart size is normal. Small amount of anterior pericardial fluid and/or thickening, similar to the prior study, and unlikely to be of any hemodynamic significance at this time. No associated pericardial calcification. There is aortic atherosclerosis, as well as atherosclerosis of the great vessels of the mediastinum and the coronary arteries, including calcified atherosclerotic plaque in the left anterior descending coronary artery. Severe calcifications of the aortic valve. Mediastinum/Nodes: Enlarged subcarinal lymph node appears slightly larger than the prior examination currently measuring 12 mm in short axis (previously 9 mm on 10/28/2015). Other previously noted nonenlarged right hilar and right paratracheal lymph nodes are stable compared to the prior  examination. No left hilar lymphadenopathy. Esophagus is unremarkable in appearance. No axillary lymphadenopathy. Left-sided subclavian single-lumen porta cath with tip terminating at the superior cavoatrial junction. Lungs/Pleura: Again noted is chronic architectural distortion and volume loss in the right middle lobe which appears similar to the prior study. There continues to be a cluster of small nodules in the periphery of the right upper lobe, largest of which measures 8 mm (image 92 of series 4), which is unchanged. Several scattered pulmonary nodules measuring 2-5 mm are again noted throughout the periphery of the lungs bilaterally, highly nonspecific and likely benign. No other new or enlarging suspicious appearing pulmonary nodules or masses are noted. Diffuse bronchial wall thickening with moderate centrilobular and paraseptal emphysema, most pronounced in the lung apices where there bullous changes. No acute consolidative airspace disease. No pleural effusions. Musculoskeletal: There are again multiple predominantly sclerotic lesions noted throughout the visualized axial and appendicular skeleton, compatible with widespread metastatic disease  to the bones. Overall, this appears very similar to the prior examination, with the largest lesion measuring up to 1.8 cm in the T3 vertebral body. CT ABDOMEN AND PELVIS FINDINGS Hepatobiliary: Multiple ill-defined hypovascular hepatic lesions are again noted, generally similar in size and distribution of the prior examination, with the largest of these lesions noted in the superior aspect of segment 2 of the liver (image 56 of series 2) measuring 11 x 16 mm (unchanged). However, some new hepatic lesions are also noted, the largest of which is a 8 mm lesion in segment 3 (image 63 of series 2). Additionally, some lesions do appear slightly more prominent than the prior examination) typified by a 5 x 9 mm lesion in segment 3 (image 63 of series 2) which previously  measured only 5 mm on 10/28/2015). No intra or extrahepatic biliary ductal dilatation. Gallbladder is normal in appearance. Pancreas: No pancreatic mass. No pancreatic ductal dilatation. No pancreatic or peripancreatic fluid or inflammatory changes. Spleen: Unremarkable. Adrenals/Urinary Tract: Multiple bilateral adrenal nodules are very similar to the prior study, the largest of which currently measures 1.8 x 2.5 cm in the right adrenal glands. Bilateral kidneys are normal in appearance. No hydroureteronephrosis. Urinary bladder is normal in appearance. Stomach/Bowel: The appearance of the stomach is normal. There is no pathologic dilatation of small bowel or colon. Several colonic diverticulae are noted, without surrounding inflammatory changes to suggest an acute diverticulitis at this time. Normal appendix. Vascular/Lymphatic: Aortic atherosclerosis, without evidence of aneurysm or dissection in the abdominal or pelvic vasculature. No lymphadenopathy noted in the abdomen or pelvis. Reproductive: Prostate gland and seminal vesicles are unremarkable in appearance. Other: No significant volume of ascites.  No pneumoperitoneum. Musculoskeletal: Numerous predominantly sclerotic lesions are again noted throughout the visualized axial and appendicular skeleton, generally very similar to the prior study. In addition, there is a large lytic lesion in S1 which which appears slightly larger than the prior examination measuring 3.7 x 3.0 cm (image 97 of series 2). Another large sclerotic lesion in the anterior aspect of L1 also appears slightly increased in size compared to the prior study, currently measuring 2.4 cm (image 66 of series 2), previously 2.0 cm. No new osseous lesions are noted. Status post PLIF at L4-S1 with interbody grafts at L4-L5 and L5-S1. 15 mm of anterolisthesis of L5 upon S1 is chronic and similar to the prior examination. IMPRESSION: 1. Today's study demonstrates slight progression of metastatic  disease, as evidenced by interval enlargement of subcarinal lymphadenopathy, as well as slight interval increase in number and size of hepatic lesions and some osseous lesions, as above. 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 2. Small area of architectural distortion and nodularity in the periphery of the right upper lobe is similar to the prior examination, favored to be benign (likely areas of mucoid impaction). 3. Additional incidental findings, as above. Electronically Signed   By: Vinnie Langton M.D.   On: 12/31/2015 13:05   Ct Abdomen Pelvis W Contrast  Result Date: 12/31/2015 CLINICAL DATA:  55 year old male with history of non-small-cell carcinoma diagnosed in June 2015. Prior history of chemotherapy and radiation therapy, formally completed. Currently undergoing another round of chemotherapy. History of COPD ( 40 pack-year history of smoking). EXAM: CT CHEST, ABDOMEN, AND PELVIS WITH CONTRAST TECHNIQUE: Multidetector CT imaging of the chest, abdomen and pelvis was performed following the standard protocol during bolus administration of intravenous contrast. CONTRAST:  172m ISOVUE-300 IOPAMIDOL (ISOVUE-300) INJECTION 61% COMPARISON:  CT the chest, abdomen  and pelvis 10/28/2015. FINDINGS: CT CHEST FINDINGS Cardiovascular: Heart size is normal. Small amount of anterior pericardial fluid and/or thickening, similar to the prior study, and unlikely to be of any hemodynamic significance at this time. No associated pericardial calcification. There is aortic atherosclerosis, as well as atherosclerosis of the great vessels of the mediastinum and the coronary arteries, including calcified atherosclerotic plaque in the left anterior descending coronary artery. Severe calcifications of the aortic valve. Mediastinum/Nodes: Enlarged subcarinal lymph node appears slightly larger than the prior examination currently measuring 12 mm in short axis (previously 9 mm on 10/28/2015). Other  previously noted nonenlarged right hilar and right paratracheal lymph nodes are stable compared to the prior examination. No left hilar lymphadenopathy. Esophagus is unremarkable in appearance. No axillary lymphadenopathy. Left-sided subclavian single-lumen porta cath with tip terminating at the superior cavoatrial junction. Lungs/Pleura: Again noted is chronic architectural distortion and volume loss in the right middle lobe which appears similar to the prior study. There continues to be a cluster of small nodules in the periphery of the right upper lobe, largest of which measures 8 mm (image 92 of series 4), which is unchanged. Several scattered pulmonary nodules measuring 2-5 mm are again noted throughout the periphery of the lungs bilaterally, highly nonspecific and likely benign. No other new or enlarging suspicious appearing pulmonary nodules or masses are noted. Diffuse bronchial wall thickening with moderate centrilobular and paraseptal emphysema, most pronounced in the lung apices where there bullous changes. No acute consolidative airspace disease. No pleural effusions. Musculoskeletal: There are again multiple predominantly sclerotic lesions noted throughout the visualized axial and appendicular skeleton, compatible with widespread metastatic disease to the bones. Overall, this appears very similar to the prior examination, with the largest lesion measuring up to 1.8 cm in the T3 vertebral body. CT ABDOMEN AND PELVIS FINDINGS Hepatobiliary: Multiple ill-defined hypovascular hepatic lesions are again noted, generally similar in size and distribution of the prior examination, with the largest of these lesions noted in the superior aspect of segment 2 of the liver (image 56 of series 2) measuring 11 x 16 mm (unchanged). However, some new hepatic lesions are also noted, the largest of which is a 8 mm lesion in segment 3 (image 63 of series 2). Additionally, some lesions do appear slightly more prominent than  the prior examination) typified by a 5 x 9 mm lesion in segment 3 (image 63 of series 2) which previously measured only 5 mm on 10/28/2015). No intra or extrahepatic biliary ductal dilatation. Gallbladder is normal in appearance. Pancreas: No pancreatic mass. No pancreatic ductal dilatation. No pancreatic or peripancreatic fluid or inflammatory changes. Spleen: Unremarkable. Adrenals/Urinary Tract: Multiple bilateral adrenal nodules are very similar to the prior study, the largest of which currently measures 1.8 x 2.5 cm in the right adrenal glands. Bilateral kidneys are normal in appearance. No hydroureteronephrosis. Urinary bladder is normal in appearance. Stomach/Bowel: The appearance of the stomach is normal. There is no pathologic dilatation of small bowel or colon. Several colonic diverticulae are noted, without surrounding inflammatory changes to suggest an acute diverticulitis at this time. Normal appendix. Vascular/Lymphatic: Aortic atherosclerosis, without evidence of aneurysm or dissection in the abdominal or pelvic vasculature. No lymphadenopathy noted in the abdomen or pelvis. Reproductive: Prostate gland and seminal vesicles are unremarkable in appearance. Other: No significant volume of ascites.  No pneumoperitoneum. Musculoskeletal: Numerous predominantly sclerotic lesions are again noted throughout the visualized axial and appendicular skeleton, generally very similar to the prior study. In addition, there is a large  lytic lesion in S1 which which appears slightly larger than the prior examination measuring 3.7 x 3.0 cm (image 97 of series 2). Another large sclerotic lesion in the anterior aspect of L1 also appears slightly increased in size compared to the prior study, currently measuring 2.4 cm (image 66 of series 2), previously 2.0 cm. No new osseous lesions are noted. Status post PLIF at L4-S1 with interbody grafts at L4-L5 and L5-S1. 15 mm of anterolisthesis of L5 upon S1 is chronic and similar  to the prior examination. IMPRESSION: 1. Today's study demonstrates slight progression of metastatic disease, as evidenced by interval enlargement of subcarinal lymphadenopathy, as well as slight interval increase in number and size of hepatic lesions and some osseous lesions, as above. 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 2. Small area of architectural distortion and nodularity in the periphery of the right upper lobe is similar to the prior examination, favored to be benign (likely areas of mucoid impaction). 3. Additional incidental findings, as above. Electronically Signed   By: Vinnie Langton M.D.   On: 12/31/2015 13:05   ASSESSMENT AND PLAN:  This is a very pleasant 55 years old [place] male recently diagnosed with:  1) Extensive stage small cell lung cancer: He is status post 6 cycles of systemic chemotherapy with carboplatin and etoposide. This was followed by prophylactic cranial irradiation.  He has been on observation for the last 18 months.  Unfortunately the recent CT scan of the chest, abdomen and pelvis showed evidence for disease progression including right paratracheal lymphadenopathy in addition to bilateral adrenal gland masses and new small liver lesion as well as bone lesion. He was started on second course of treatment with chemotherapy was carboplatin and etoposide status post 2 cycle. He tolerated his treatment well with no significant adverse effects except for fatigue. Unfortunately the recent CT scan of the chest, abdomen and pelvis showed evidence for further disease progression in the liver and bone. His treatment with carboplatin and etoposide was discontinued. The patient was started on second line treatment with reduced dose cisplatin and irinotecan on days 1 and 8 every 3 weeks. Status post 3 cycles. He tolerated the previous cycles of the chemotherapy well except for fatigue and occasional episodes of diarrhea. The recent CT scan  of the chest, abdomen and pelvis showed slight disease progression. The patient is also in the process of moving to [place] to be close to his family. I referred him to Dr. Lillia Abed, thoracic oncologist at [facility] for evaluation and further treatment options. He is scheduled to see him on 01/20/2016. I will wait for the recommendation from Dr. Marjie Skiff before resuming his systemic therapy. I will arrange for the patient to come back for follow-up visit in 2 weeks for evaluation and discussion of his treatment options after his visit to [facility].  2) right lower lobe pulmonary embolus: He will continue his Coumadin treatment under the care of his primary care physician.  3) For the weight loss, I recommended for the patient to continue Marinol.   He was advised to call immediately if he has any concerning symptoms in the interval. The patient voices understanding of current disease status and treatment options and is in agreement with the current care plan.  All questions were answered. The patient knows to call the clinic with any problems, questions or concerns. We can certainly see the patient much sooner if necessary.  Disclaimer: This note was dictated with voice  recognition software. Similar sounding words can inadvertently be transcribed and may not be corrected upon review.

## 2016-01-08 NOTE — Telephone Encounter (Signed)
Per staff message and POF I have scheduled appts. Advised scheduler of appts. JMW  

## 2016-01-08 NOTE — Telephone Encounter (Signed)
Gave pt cal & avs °

## 2016-01-09 ENCOUNTER — Ambulatory Visit: Payer: BLUE CROSS/BLUE SHIELD

## 2016-01-09 ENCOUNTER — Other Ambulatory Visit: Payer: BLUE CROSS/BLUE SHIELD

## 2016-01-09 ENCOUNTER — Ambulatory Visit
Admission: RE | Admit: 2016-01-09 | Discharge: 2016-01-09 | Disposition: A | Discharge status: Home / Self Care | Payer: BLUE CROSS/BLUE SHIELD | Source: Ambulatory Visit | Attending: Radiation Oncology | Admitting: Radiation Oncology

## 2016-01-09 ENCOUNTER — Ambulatory Visit: Payer: BLUE CROSS/BLUE SHIELD | Admitting: Internal Medicine

## 2016-01-09 VITALS — BP 131/86 | HR 95 | Temp 98.3°F | Ht 69.0 in | Wt 141.8 lb

## 2016-01-09 DIAGNOSIS — Y842 Radiological procedure and radiotherapy as the cause of abnormal reaction of the patient, or of later complication, without mention of misadventure at the time of the procedure: Secondary | ICD-10-CM | POA: Diagnosis not present

## 2016-01-09 DIAGNOSIS — M899 Disorder of bone, unspecified: Secondary | ICD-10-CM | POA: Insufficient documentation

## 2016-01-09 DIAGNOSIS — C3490 Malignant neoplasm of unspecified part of unspecified bronchus or lung: Secondary | ICD-10-CM | POA: Diagnosis present

## 2016-01-09 LAB — T-HELPER CELL (CD4) - (RCID CLINIC ONLY)
CD4 T CELL ABS: 210 /uL — AB (ref 400–2700)
CD4 T CELL HELPER: 36 % (ref 33–55)

## 2016-01-09 NOTE — Progress Notes (Signed)
Jaime Morris here for follow up.  He denies having pain.  He reports feeling "wobbly" on his feet.  He reports having diarrhea daily and takes Imodium as needed.  He reports having nausea and vomiting once today.  He denies having any bladder issues.  He will have chemotherapy on 8/10.  He is also meeting with an oncologist at [facility] on 01/20/16 to see when he can move to [place].  BP 131/86 (BP Location: Left Arm, Patient Position: Sitting)   Pulse 95   Temp 98.3 F (36.8 C) (Oral)   Ht '5\' 9"'$  (1.753 m)   Wt 141 lb 12.8 oz (64.3 kg)   SpO2 100%   BMI 20.94 kg/m    Wt Readings from Last 3 Encounters:  01/09/16 141 lb 12.8 oz (64.3 kg)  01/08/16 141 lb (64 kg)  12/19/15 142 lb (64.4 kg)

## 2016-01-09 NOTE — Progress Notes (Signed)
Radiation Oncology         [phone]) [phone] ________________________________  Name: Jaime Morris MRN: [id number]  Date: 01/09/2016  DOB: December 02, 1960  Follow-Up Visit Note  CC: Irven Shelling, MD  Curt Bears, MD    Diagnosis: Extensive stage small cell lung cancer, progressive spine lesions   Interval Since Last Radiation:  One month, (12/09/2015)  Narrative:  The patient returns today for routine follow-up.   He denies having pain.  He reports feeling "wobbly" on his feet.  He reports having diarrhea daily and takes Imodium as needed.  He reports having nausea and vomiting once today.  He denies having any bladder issues.  He will have chemotherapy on 8/10.  He is also meeting with an oncologist at [facility] on 01/20/16 to see when he can move to [place], on August 8th.  The patient reports that his left hip pain has dissipated since completing the radiation and no longer takes pain medication for this. 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  Meds: Current Outpatient Prescriptions  Medication Sig Dispense Refill  . albuterol (PROAIR HFA) 108 (90 BASE) MCG/ACT inhaler Inhale 2 puffs into the lungs every 4 (four) hours as needed for wheezing or shortness of breath. Reported on 08/13/2015    . doxylamine, Sleep, (UNISOM) 25 MG tablet Take 50 mg by mouth at bedtime as needed for sleep. Reported on 12/19/2015    . dronabinol (MARINOL) 10 MG capsule TAKE ONE CAPSULE BY MOUTH TWICE DAILY BEFORE A MEAL 60 capsule 2  . efavirenz-emtricitabine-tenofovir (ATRIPLA) 600-200-300 MG tablet Take 1 tablet by mouth daily. 30 tablet 3  . lidocaine-prilocaine (EMLA) cream Apply 1 application topically as needed. 30 g 4  . loperamide (IMODIUM) 2 MG capsule Take by mouth as needed for diarrhea or loose stools. Reported on 12/05/2015    .  Melatonin 10 MG TABS Take 10 mg by mouth at bedtime as needed (sleep).    . Multiple Vitamin (MULTIVITAMIN) tablet Take 1 tablet by mouth daily.    . Nutritional Supplements (HIGH-PROTEIN NUTRITIONAL SHAKE) LIQD Take 1 each by mouth 2 (two) times daily between meals.    Marland Kitchen oxyCODONE-acetaminophen (PERCOCET/ROXICET) 5-325 MG tablet Take 1 tablet by mouth every 6 (six) hours as needed for severe pain. 40 tablet 0  . prochlorperazine (COMPAZINE) 10 MG tablet Take 1 tablet (10 mg total) by mouth every 6 (six) hours as needed for nausea or vomiting. 30 tablet 1  . selenium sulfide (SELSUN) 2.5 % shampoo Reported on 12/19/2015  1  . tolterodine (DETROL LA) 4 MG 24 hr capsule take 1 capsule daily  5  . traZODone (DESYREL) 100 MG tablet Take 100 mg by mouth at bedtime.    . varenicline (CHANTIX) 1 MG tablet Take 1 mg by mouth 2 (two) times daily. Reported on 08/13/2015    . vitamin B-12 (CYANOCOBALAMIN) 500 MCG tablet Take 500 mcg by mouth daily.    Marland Kitchen warfarin (COUMADIN) 5 MG tablet TAKE 1 TABLET BY MOUTH AS DIRECTED 30 tablet 0  . elvitegravir-cobicistat-emtricitabine-tenofovir (GENVOYA) 150-150-200-10 MG TABS tablet Take 1 tablet by mouth daily. (Patient not taking: Reported on 01/09/2016) 30 tablet 5   No current facility-administered medications  for this encounter.    Facility-Administered Medications Ordered in Other Encounters  Medication Dose Route Frequency Provider Last Rate Last Dose  . sodium chloride 0.9 % injection 10 mL  10 mL Intracatheter PRN Curt Bears, MD   10 mL at 01/30/14 1451    Physical Findings: The patient is in no acute distress. Patient is alert and oriented.  height is '5\' 9"'$  (1.753 m) and weight is 141 lb 12.8 oz (64.3 kg). His oral temperature is 98.3 F (36.8 C). His blood pressure is 131/86 and his pulse is 95. His oxygen saturation is 100%. . Lungs are clear to auscultation bilaterally. Heart has regular rate and rhythm. No palpable cervical, supraclavicular, or  axillary adenopathy. Abdomen soft, non-tender, normal bowel sounds. Strength is good in lower extremities.     Lab Findings: Lab Results  Component Value Date   WBC 2.3 (L) 01/08/2016   HGB 11.9 (L) 01/08/2016   HCT 34.7 (L) 01/08/2016   MCV 96.9 01/08/2016   PLT 147 01/08/2016    Radiographic Findings: Ct Chest W Contrast  Result Date: 12/31/2015 CLINICAL DATA:  55 year old male with history of non-small-cell carcinoma diagnosed in June 2015. Prior history of chemotherapy and radiation therapy, formally completed. Currently undergoing another round of chemotherapy. History of COPD ( 40 pack-year history of smoking). EXAM: CT CHEST, ABDOMEN, AND PELVIS WITH CONTRAST TECHNIQUE: Multidetector CT imaging of the chest, abdomen and pelvis was performed following the standard protocol during bolus administration of intravenous contrast. CONTRAST:  138m ISOVUE-300 IOPAMIDOL (ISOVUE-300) INJECTION 61% COMPARISON:  CT the chest, abdomen and pelvis 10/28/2015. FINDINGS: CT CHEST FINDINGS Cardiovascular: Heart size is normal. Small amount of anterior pericardial fluid and/or thickening, similar to the prior study, and unlikely to be of any hemodynamic significance at this time. No associated pericardial calcification. There is aortic atherosclerosis, as well as atherosclerosis of the great vessels of the mediastinum and the coronary arteries, including calcified atherosclerotic plaque in the left anterior descending coronary artery. Severe calcifications of the aortic valve. Mediastinum/Nodes: Enlarged subcarinal lymph node appears slightly larger than the prior examination currently measuring 12 mm in short axis (previously 9 mm on 10/28/2015). Other previously noted nonenlarged right hilar and right paratracheal lymph nodes are stable compared to the prior examination. No left hilar lymphadenopathy. Esophagus is unremarkable in appearance. No axillary lymphadenopathy. Left-sided subclavian single-lumen porta  cath with tip terminating at the superior cavoatrial junction. Lungs/Pleura: Again noted is chronic architectural distortion and volume loss in the right middle lobe which appears similar to the prior study. There continues to be a cluster of small nodules in the periphery of the right upper lobe, largest of which measures 8 mm (image 92 of series 4), which is unchanged. Several scattered pulmonary nodules measuring 2-5 mm are again noted throughout the periphery of the lungs bilaterally, highly nonspecific and likely benign. No other new or enlarging suspicious appearing pulmonary nodules or masses are noted. Diffuse bronchial wall thickening with moderate centrilobular and paraseptal emphysema, most pronounced in the lung apices where there bullous changes. No acute consolidative airspace disease. No pleural effusions. Musculoskeletal: There are again multiple predominantly sclerotic lesions noted throughout the visualized axial and appendicular skeleton, compatible with widespread metastatic disease to the bones. Overall, this appears very similar to the prior examination, with the largest lesion measuring up to 1.8 cm in the T3 vertebral body. CT ABDOMEN AND PELVIS FINDINGS Hepatobiliary: Multiple ill-defined hypovascular hepatic lesions are again noted, generally similar in size and distribution of the prior  examination, with the largest of these lesions noted in the superior aspect of segment 2 of the liver (image 56 of series 2) measuring 11 x 16 mm (unchanged). However, some new hepatic lesions are also noted, the largest of which is a 8 mm lesion in segment 3 (image 63 of series 2). Additionally, some lesions do appear slightly more prominent than the prior examination) typified by a 5 x 9 mm lesion in segment 3 (image 63 of series 2) which previously measured only 5 mm on 10/28/2015). No intra or extrahepatic biliary ductal dilatation. Gallbladder is normal in appearance. Pancreas: No pancreatic mass. No  pancreatic ductal dilatation. No pancreatic or peripancreatic fluid or inflammatory changes. Spleen: Unremarkable. Adrenals/Urinary Tract: Multiple bilateral adrenal nodules are very similar to the prior study, the largest of which currently measures 1.8 x 2.5 cm in the right adrenal glands. Bilateral kidneys are normal in appearance. No hydroureteronephrosis. Urinary bladder is normal in appearance. Stomach/Bowel: The appearance of the stomach is normal. There is no pathologic dilatation of small bowel or colon. Several colonic diverticulae are noted, without surrounding inflammatory changes to suggest an acute diverticulitis at this time. Normal appendix. Vascular/Lymphatic: Aortic atherosclerosis, without evidence of aneurysm or dissection in the abdominal or pelvic vasculature. No lymphadenopathy noted in the abdomen or pelvis. Reproductive: Prostate gland and seminal vesicles are unremarkable in appearance. Other: No significant volume of ascites.  No pneumoperitoneum. Musculoskeletal: Numerous predominantly sclerotic lesions are again noted throughout the visualized axial and appendicular skeleton, generally very similar to the prior study. In addition, there is a large lytic lesion in S1 which which appears slightly larger than the prior examination measuring 3.7 x 3.0 cm (image 97 of series 2). Another large sclerotic lesion in the anterior aspect of L1 also appears slightly increased in size compared to the prior study, currently measuring 2.4 cm (image 66 of series 2), previously 2.0 cm. No new osseous lesions are noted. Status post PLIF at L4-S1 with interbody grafts at L4-L5 and L5-S1. 15 mm of anterolisthesis of L5 upon S1 is chronic and similar to the prior examination. IMPRESSION: 1. Today's study demonstrates slight progression of metastatic disease, as evidenced by interval enlargement of subcarinal lymphadenopathy, as well as slight interval increase in number and size of hepatic lesions and some  osseous lesions, as above. 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 2. Small area of architectural distortion and nodularity in the periphery of the right upper lobe is similar to the prior examination, favored to be benign (likely areas of mucoid impaction). 3. Additional incidental findings, as above. Electronically Signed   By: Vinnie Langton M.D.   On: 12/31/2015 13:05   Ct Abdomen Pelvis W Contrast  Result Date: 12/31/2015 CLINICAL DATA:  55 year old male with history of non-small-cell carcinoma diagnosed in June 2015. Prior history of chemotherapy and radiation therapy, formally completed. Currently undergoing another round of chemotherapy. History of COPD ( 40 pack-year history of smoking). EXAM: CT CHEST, ABDOMEN, AND PELVIS WITH CONTRAST TECHNIQUE: Multidetector CT imaging of the chest, abdomen and pelvis was performed following the standard protocol during bolus administration of intravenous contrast. CONTRAST:  128m ISOVUE-300 IOPAMIDOL (ISOVUE-300) INJECTION 61% COMPARISON:  CT the chest, abdomen and pelvis 10/28/2015. FINDINGS: CT CHEST FINDINGS Cardiovascular: Heart size is normal. Small amount of anterior pericardial fluid and/or thickening, similar to the prior study, and unlikely to be of any hemodynamic significance at this time. No associated pericardial calcification. There is aortic atherosclerosis, as well as atherosclerosis of  the great vessels of the mediastinum and the coronary arteries, including calcified atherosclerotic plaque in the left anterior descending coronary artery. Severe calcifications of the aortic valve. Mediastinum/Nodes: Enlarged subcarinal lymph node appears slightly larger than the prior examination currently measuring 12 mm in short axis (previously 9 mm on 10/28/2015). Other previously noted nonenlarged right hilar and right paratracheal lymph nodes are stable compared to the prior examination. No left hilar lymphadenopathy. Esophagus is  unremarkable in appearance. No axillary lymphadenopathy. Left-sided subclavian single-lumen porta cath with tip terminating at the superior cavoatrial junction. Lungs/Pleura: Again noted is chronic architectural distortion and volume loss in the right middle lobe which appears similar to the prior study. There continues to be a cluster of small nodules in the periphery of the right upper lobe, largest of which measures 8 mm (image 92 of series 4), which is unchanged. Several scattered pulmonary nodules measuring 2-5 mm are again noted throughout the periphery of the lungs bilaterally, highly nonspecific and likely benign. No other new or enlarging suspicious appearing pulmonary nodules or masses are noted. Diffuse bronchial wall thickening with moderate centrilobular and paraseptal emphysema, most pronounced in the lung apices where there bullous changes. No acute consolidative airspace disease. No pleural effusions. Musculoskeletal: There are again multiple predominantly sclerotic lesions noted throughout the visualized axial and appendicular skeleton, compatible with widespread metastatic disease to the bones. Overall, this appears very similar to the prior examination, with the largest lesion measuring up to 1.8 cm in the T3 vertebral body. CT ABDOMEN AND PELVIS FINDINGS Hepatobiliary: Multiple ill-defined hypovascular hepatic lesions are again noted, generally similar in size and distribution of the prior examination, with the largest of these lesions noted in the superior aspect of segment 2 of the liver (image 56 of series 2) measuring 11 x 16 mm (unchanged). However, some new hepatic lesions are also noted, the largest of which is a 8 mm lesion in segment 3 (image 63 of series 2). Additionally, some lesions do appear slightly more prominent than the prior examination) typified by a 5 x 9 mm lesion in segment 3 (image 63 of series 2) which previously measured only 5 mm on 10/28/2015). No intra or extrahepatic  biliary ductal dilatation. Gallbladder is normal in appearance. Pancreas: No pancreatic mass. No pancreatic ductal dilatation. No pancreatic or peripancreatic fluid or inflammatory changes. Spleen: Unremarkable. Adrenals/Urinary Tract: Multiple bilateral adrenal nodules are very similar to the prior study, the largest of which currently measures 1.8 x 2.5 cm in the right adrenal glands. Bilateral kidneys are normal in appearance. No hydroureteronephrosis. Urinary bladder is normal in appearance. Stomach/Bowel: The appearance of the stomach is normal. There is no pathologic dilatation of small bowel or colon. Several colonic diverticulae are noted, without surrounding inflammatory changes to suggest an acute diverticulitis at this time. Normal appendix. Vascular/Lymphatic: Aortic atherosclerosis, without evidence of aneurysm or dissection in the abdominal or pelvic vasculature. No lymphadenopathy noted in the abdomen or pelvis. Reproductive: Prostate gland and seminal vesicles are unremarkable in appearance. Other: No significant volume of ascites.  No pneumoperitoneum. Musculoskeletal: Numerous predominantly sclerotic lesions are again noted throughout the visualized axial and appendicular skeleton, generally very similar to the prior study. In addition, there is a large lytic lesion in S1 which which appears slightly larger than the prior examination measuring 3.7 x 3.0 cm (image 97 of series 2). Another large sclerotic lesion in the anterior aspect of L1 also appears slightly increased in size compared to the prior study, currently measuring 2.4 cm (image  66 of series 2), previously 2.0 cm. No new osseous lesions are noted. Status post PLIF at L4-S1 with interbody grafts at L4-L5 and L5-S1. 15 mm of anterolisthesis of L5 upon S1 is chronic and similar to the prior examination. IMPRESSION: 1. Today's study demonstrates slight progression of metastatic disease, as evidenced by interval enlargement of subcarinal  lymphadenopathy, as well as slight interval increase in number and size of hepatic lesions and some osseous lesions, as above. 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 2. Small area of architectural distortion and nodularity in the periphery of the right upper lobe is similar to the prior examination, favored to be benign (likely areas of mucoid impaction). 3. Additional incidental findings, as above. Electronically Signed   By: Vinnie Langton M.D.   On: 12/31/2015 13:05    Impression:  The patient is recovering from the effects of radiation.  Pain better with palliative radiation therapy to the sacrum region.  Plan:  The patient is in the process of moving up to [place] and I will follow him on a PRN basis. Call or return to clinic prn if these symptoms worsen or fail to improve as anticipated.   ____________________________________    This document serves as a record of services personally performed by Gery Pray, MD. It was created on his behalf by Truddie Hidden, a trained medical scribe. The creation of this record is based on the scribe's personal observations and the provider's statements to them. This document has been checked and approved by the attending provider.

## 2016-01-09 NOTE — Addendum Note (Signed)
Encounter addended by: Jacqulyn Liner, RN on: 01/09/2016  9:31 AM<BR>    Actions taken: Charge Capture section accepted

## 2016-01-10 LAB — HIV-1 RNA QUANT-NO REFLEX-BLD
HIV 1 RNA QUANT: 499 {copies}/mL — AB (ref <20)
HIV-1 RNA QUANT, LOG: 2.7 {Log_copies}/mL — AB (ref <1.30)

## 2016-01-15 ENCOUNTER — Other Ambulatory Visit: Payer: Self-pay | Admitting: Internal Medicine

## 2016-01-15 ENCOUNTER — Telehealth: Payer: Self-pay | Admitting: *Deleted

## 2016-01-15 DIAGNOSIS — Z21 Asymptomatic human immunodeficiency virus [HIV] infection status: Secondary | ICD-10-CM

## 2016-01-15 NOTE — Telephone Encounter (Signed)
-----   Message from Thayer Headings, MD sent at 01/10/2016  4:37 PM EDT ----- His viral load is up a bit.  Can we have it rechecked in about 2 weeks?  I think he had it done at his oncologists office, so it may just be a processing issue.  thanks

## 2016-01-15 NOTE — Telephone Encounter (Signed)
Patient scheduled for lab appt 01/27/16. Jaime Morris

## 2016-01-16 ENCOUNTER — Ambulatory Visit: Payer: BLUE CROSS/BLUE SHIELD

## 2016-01-20 ENCOUNTER — Encounter (HOSPITAL_COMMUNITY): Payer: Self-pay

## 2016-01-21 ENCOUNTER — Other Ambulatory Visit: Payer: Self-pay | Admitting: Internal Medicine

## 2016-01-23 ENCOUNTER — Ambulatory Visit (HOSPITAL_BASED_OUTPATIENT_CLINIC_OR_DEPARTMENT_OTHER): Payer: BLUE CROSS/BLUE SHIELD | Admitting: Nurse Practitioner

## 2016-01-23 ENCOUNTER — Ambulatory Visit: Payer: BLUE CROSS/BLUE SHIELD

## 2016-01-23 ENCOUNTER — Ambulatory Visit (HOSPITAL_BASED_OUTPATIENT_CLINIC_OR_DEPARTMENT_OTHER): Payer: BLUE CROSS/BLUE SHIELD

## 2016-01-23 ENCOUNTER — Other Ambulatory Visit (HOSPITAL_BASED_OUTPATIENT_CLINIC_OR_DEPARTMENT_OTHER): Payer: BLUE CROSS/BLUE SHIELD

## 2016-01-23 ENCOUNTER — Telehealth: Payer: Self-pay | Admitting: Internal Medicine

## 2016-01-23 ENCOUNTER — Encounter: Payer: Self-pay | Admitting: Nurse Practitioner

## 2016-01-23 VITALS — BP 126/81 | HR 98 | Temp 98.7°F | Resp 18 | Ht 69.0 in | Wt 140.4 lb

## 2016-01-23 DIAGNOSIS — Z95828 Presence of other vascular implants and grafts: Secondary | ICD-10-CM

## 2016-01-23 DIAGNOSIS — R110 Nausea: Secondary | ICD-10-CM | POA: Diagnosis not present

## 2016-01-23 DIAGNOSIS — C3492 Malignant neoplasm of unspecified part of left bronchus or lung: Secondary | ICD-10-CM

## 2016-01-23 DIAGNOSIS — Z5111 Encounter for antineoplastic chemotherapy: Secondary | ICD-10-CM

## 2016-01-23 DIAGNOSIS — C7951 Secondary malignant neoplasm of bone: Secondary | ICD-10-CM

## 2016-01-23 DIAGNOSIS — B20 Human immunodeficiency virus [HIV] disease: Secondary | ICD-10-CM

## 2016-01-23 DIAGNOSIS — I2699 Other pulmonary embolism without acute cor pulmonale: Secondary | ICD-10-CM | POA: Diagnosis not present

## 2016-01-23 DIAGNOSIS — C787 Secondary malignant neoplasm of liver and intrahepatic bile duct: Secondary | ICD-10-CM

## 2016-01-23 DIAGNOSIS — C3491 Malignant neoplasm of unspecified part of right bronchus or lung: Secondary | ICD-10-CM

## 2016-01-23 DIAGNOSIS — C3490 Malignant neoplasm of unspecified part of unspecified bronchus or lung: Secondary | ICD-10-CM | POA: Diagnosis not present

## 2016-01-23 LAB — CBC WITH DIFFERENTIAL/PLATELET
BASO%: 0.3 % (ref 0.0–2.0)
BASOS ABS: 0 10*3/uL (ref 0.0–0.1)
EOS ABS: 0.1 10*3/uL (ref 0.0–0.5)
EOS%: 2.3 % (ref 0.0–7.0)
HEMATOCRIT: 33.7 % — AB (ref 38.4–49.9)
HGB: 11.5 g/dL — ABNORMAL LOW (ref 13.0–17.1)
LYMPH%: 23.8 % (ref 14.0–49.0)
MCH: 33.2 pg (ref 27.2–33.4)
MCHC: 34.1 g/dL (ref 32.0–36.0)
MCV: 97.4 fL (ref 79.3–98.0)
MONO#: 0.5 10*3/uL (ref 0.1–0.9)
MONO%: 15.4 % — AB (ref 0.0–14.0)
NEUT#: 1.8 10*3/uL (ref 1.5–6.5)
NEUT%: 58.2 % (ref 39.0–75.0)
PLATELETS: 122 10*3/uL — AB (ref 140–400)
RBC: 3.46 10*6/uL — ABNORMAL LOW (ref 4.20–5.82)
RDW: 17.8 % — ABNORMAL HIGH (ref 11.0–14.6)
WBC: 3.1 10*3/uL — ABNORMAL LOW (ref 4.0–10.3)
lymph#: 0.7 10*3/uL — ABNORMAL LOW (ref 0.9–3.3)

## 2016-01-23 LAB — COMPREHENSIVE METABOLIC PANEL
ALK PHOS: 105 U/L (ref 40–150)
ALT: 17 U/L (ref 0–55)
ANION GAP: 7 meq/L (ref 3–11)
AST: 26 U/L (ref 5–34)
Albumin: 3.1 g/dL — ABNORMAL LOW (ref 3.5–5.0)
CALCIUM: 8.9 mg/dL (ref 8.4–10.4)
CO2: 25 meq/L (ref 22–29)
CREATININE: 0.7 mg/dL (ref 0.7–1.3)
Chloride: 106 mEq/L (ref 98–109)
EGFR: >90 mL/min/{1.73_m2} (ref >90)
Glucose: 103 mg/dl (ref 70–140)
Potassium: 4.2 mEq/L (ref 3.5–5.1)
Sodium: 137 mEq/L (ref 136–145)
TOTAL PROTEIN: 6.3 g/dL — AB (ref 6.4–8.3)
Total Bilirubin: <0.3 mg/dL (ref 0.20–1.20)

## 2016-01-23 MED ORDER — POTASSIUM CHLORIDE 2 MEQ/ML IV SOLN
Freq: Once | INTRAVENOUS | Status: AC
  Administered 2016-01-23: 11:00:00 via INTRAVENOUS
  Filled 2016-01-23: qty 10

## 2016-01-23 MED ORDER — FOSAPREPITANT DIMEGLUMINE INJECTION 150 MG
Freq: Once | INTRAVENOUS | Status: AC
  Administered 2016-01-23: 12:00:00 via INTRAVENOUS
  Filled 2016-01-23: qty 5

## 2016-01-23 MED ORDER — PALONOSETRON HCL INJECTION 0.25 MG/5ML
0.2500 mg | Freq: Once | INTRAVENOUS | Status: AC
  Administered 2016-01-23: 0.25 mg via INTRAVENOUS

## 2016-01-23 MED ORDER — SODIUM CHLORIDE 0.9 % IV SOLN
Freq: Once | INTRAVENOUS | Status: AC
  Administered 2016-01-23: 11:00:00 via INTRAVENOUS

## 2016-01-23 MED ORDER — HEPARIN SOD (PORK) LOCK FLUSH 100 UNIT/ML IV SOLN
500.0000 [IU] | Freq: Once | INTRAVENOUS | Status: AC | PRN
  Administered 2016-01-23: 500 [IU]
  Filled 2016-01-23: qty 5

## 2016-01-23 MED ORDER — PALONOSETRON HCL INJECTION 0.25 MG/5ML
INTRAVENOUS | Status: AC
  Filled 2016-01-23: qty 5

## 2016-01-23 MED ORDER — IRINOTECAN HCL CHEMO INJECTION 100 MG/5ML
65.0000 mg/m2 | Freq: Once | INTRAVENOUS | Status: AC
  Administered 2016-01-23: 120 mg via INTRAVENOUS
  Filled 2016-01-23: qty 5

## 2016-01-23 MED ORDER — ATROPINE SULFATE 1 MG/ML IJ SOLN
INTRAMUSCULAR | Status: AC
  Filled 2016-01-23: qty 1

## 2016-01-23 MED ORDER — SODIUM CHLORIDE 0.9% FLUSH
10.0000 mL | INTRAVENOUS | Status: DC | PRN
  Administered 2016-01-23: 10 mL
  Filled 2016-01-23: qty 10

## 2016-01-23 MED ORDER — SODIUM CHLORIDE 0.9 % IV SOLN
30.0000 mg/m2 | Freq: Once | INTRAVENOUS | Status: AC
  Administered 2016-01-23: 55 mg via INTRAVENOUS
  Filled 2016-01-23: qty 55

## 2016-01-23 MED ORDER — ATROPINE SULFATE 1 MG/ML IJ SOLN
0.5000 mg | Freq: Once | INTRAMUSCULAR | Status: AC | PRN
  Administered 2016-01-23: 0.5 mg via INTRAVENOUS

## 2016-01-23 MED ORDER — SODIUM CHLORIDE 0.9 % IJ SOLN
10.0000 mL | INTRAMUSCULAR | Status: DC | PRN
  Filled 2016-01-23: qty 10

## 2016-01-23 NOTE — Telephone Encounter (Signed)
Appointments complete per 8/10 LOS. Patient will get avs report/appointments in infusion area - patient aware.

## 2016-01-23 NOTE — Patient Instructions (Signed)

## 2016-01-23 NOTE — Progress Notes (Addendum)
[facility] OFFICE PROGRESS NOTE   DIAGNOSIS:  1) Small cell carcinoma of lung, Extensive stage diagnosed in June 2015. 2) incidental diagnosis of pulmonary embolism on CT scan of the chest performed on 04/18/2014. Primary site: Lung (Right)  Staging method: AJCC 7th Edition  Clinical: Stage IV (T1a, N1, M1b) signed by Curt Bears, MD on 12/07/2013 3:19 PM  Summary: Stage IV (T1a, N1, M1b)   PRIOR THERAPY:  1)  Systemic chemotherapy with carboplatin for AUC of 5 given on day 1, etoposide 100 mg/m2 given on days 1, 2 and 3 with neulast support on day 4. Status post 6 cycles.  2) Lovenox 120 mg subcutaneously started 04/18/2014. 3) status post whole brain irradiation under the care of Dr. Sondra Come completed 06/21/2014. 4) systemic chemotherapy again with carboplatin for AUC of 5 on day 1 and etoposide 100 MG/M2 on days 1, 2 and 3 with Neulasta support on day 4. First cycle expected on 09/17/2015. Status post 2 cycles discontinued secondary to disease progression.  CURRENT THERAPY:  1) systemic chemotherapy with cisplatin 30 MG/M2 and irinotecan 65 MG/M2 on days 1 and 8 every 3 weeks. First dose 11/07/2015. Status post 3 cycles. 2) initial treatment with Xarelto 20 mg by mouth daily. First dose of treatment on 05/03/2014. This was switched on 10/01/2014 to Coumadin and followed by primary care physician.    INTERVAL HISTORY:   Mr. Sheeley returns as scheduled. He completed cycle 3 day 8 cisplatin/irinotecan 12/26/2015. He has intermittent nausea. He thinks he has a "cold sore". No ulcers within the oral cavity. He continues to have loose stools. He takes Imodium as needed. He has stable dyspnea on exertion and a stable cough. No fever. He continues Coumadin. No bleeding. He reports his appetite was "a little better" when he was in [place].  Objective:  Vital signs in last 24 hours:  Blood pressure 126/81, pulse 98, temperature 98.7 F (37.1 C), temperature source Oral,  resp. rate 18, height '5\' 9"'$  (1.753 m), weight 140 lb 6.4 oz (63.7 kg), SpO2 100 %.    HEENT: No thrush or ulcers. Resp: Lungs clear bilaterally. Cardio: Regular rate and rhythm. GI: Abdomen soft and nontender. No hepatomegaly. Vascular: No leg edema. Calves soft and nontender. Port-A-Cath without erythema.  Lab Results:  Lab Results  Component Value Date   WBC 3.1 (L) 01/23/2016   HGB 11.5 (L) 01/23/2016   HCT 33.7 (L) 01/23/2016   MCV 97.4 01/23/2016   PLT 122 (L) 01/23/2016   NEUTROABS 1.8 01/23/2016    Imaging:  No results found.  Medications: I have reviewed the patient's current medications.  Assessment/Plan: 1. Extensive stage small cell lung cancer status post 6 cycles of carboplatin and etoposide followed by prophylactic cranial radiation. Currently on active treatment with cisplatin/irinotecan on a day one/day 8 schedule every 3 weeks. He has completed 3 cycles. Recent restaging CT evaluation shows slight disease progression. 2. Right lower lobe pulmonary embolus. He continues Coumadin followed by his PCP. 3. Weight loss. He continues Marinol. Weight stable today.   Disposition: Mr. Kessinger appears stable. He was recently seen by Dr. Marjie Skiff at [facility]. He is unsure when he will be relocating to [place], possibly early September. Dr. Worthy Flank recommendation is to continue the current treatment with cisplatin/irinotecan, cycle 4 day 1 today. He will return for the day 8 treatment in one week. We scheduled a follow-up visit in 3 weeks. He will contact the office in the interim with any problems.  Patient seen with Dr. Julien Nordmann.    Ned Card ANP/GNP-BC   01/23/2016  10:22 AM   ADDENDUM: Hematology/Oncology Attending: I had a face to face encounter with the patient today. I recommended her care plan. This is a very pleasant50 years old African-American male with extensive stage small cell lung cancer currently undergoing second line systemic  chemotherapy with reduced dose cisplatin and irinotecan status post 3 cycles. He tolerated the previous 3 cycles of his treatment well but her restaging scan showed evidence for slight disease progression. The patient was seen by Dr. Marjie Skiff at [facility] for second of June and he recommended for him to continue with the treatment for now until he moves completely to [place] in September. I recommended for the patient to proceed with day 1 of cycle #4 today as a scheduled. He would come back for follow-up visit in 3 weeks for evaluation before starting cycle #5. For the weight loss, the patient will continue on Marinol. He was advised to call immediately if he has any concerning symptoms in the interval.  Disclaimer: This note was dictated with voice recognition software. Similar sounding words can inadvertently be transcribed and may be missed upon review. Eilleen Kempf., MD 01/23/16

## 2016-01-23 NOTE — Patient Instructions (Signed)
[facility] Discharge Instructions for Patients Receiving Chemotherapy  Today you received the following chemotherapy agents: irinotecan, cisplatin  To help prevent nausea and vomiting after your treatment, we encourage you to take your nausea medication.  Take it as often as prescribed.     If you develop nausea and vomiting that is not controlled by your nausea medication, call the clinic. If it is after clinic hours your family physician or the after hours number for the clinic or go to the Emergency Department.   BELOW ARE SYMPTOMS THAT SHOULD BE REPORTED IMMEDIATELY:  *FEVER GREATER THAN 100.5 F  *CHILLS WITH OR WITHOUT FEVER  NAUSEA AND VOMITING THAT IS NOT CONTROLLED WITH YOUR NAUSEA MEDICATION  *UNUSUAL SHORTNESS OF BREATH  *UNUSUAL BRUISING OR BLEEDING  TENDERNESS IN MOUTH AND THROAT WITH OR WITHOUT PRESENCE OF ULCERS  *URINARY PROBLEMS  *BOWEL PROBLEMS  UNUSUAL RASH Items with * indicate a potential emergency and should be followed up as soon as possible.  Feel free to call the clinic you have any questions or concerns. The clinic phone number is [phone].   I have been informed and understand all the instructions given to me. I know to contact the clinic, my physician, or go to the Emergency Department if any problems should occur. I do not have any questions at this time, but understand that I may call the clinic during office hours   should I have any questions or need assistance in obtaining follow up care.    __________________________________________  _____________  __________ Signature of Patient or Authorized Representative            Date                   Time    __________________________________________ Nurse's Signature

## 2016-01-27 ENCOUNTER — Other Ambulatory Visit: Payer: BLUE CROSS/BLUE SHIELD

## 2016-01-27 ENCOUNTER — Telehealth: Payer: Self-pay | Admitting: *Deleted

## 2016-01-27 DIAGNOSIS — Z21 Asymptomatic human immunodeficiency virus [HIV] infection status: Secondary | ICD-10-CM

## 2016-01-27 NOTE — Telephone Encounter (Signed)
error 

## 2016-01-27 NOTE — Telephone Encounter (Signed)
Patient came in for labs today. He was upset that his medication was denied because he had not been here in over a year; causing him to miss a month of his medication. He has been back on it for over a month now and hopefully his labs will reflect this. Myrtis Hopping

## 2016-01-28 LAB — HIV-1 RNA QUANT-NO REFLEX-BLD
HIV 1 RNA Quant: 73 copies/mL — ABNORMAL HIGH (ref <20)
HIV-1 RNA Quant, Log: 1.86 Log copies/mL — ABNORMAL HIGH (ref <1.30)

## 2016-01-29 ENCOUNTER — Telehealth: Payer: Self-pay | Admitting: *Deleted

## 2016-01-29 NOTE — Telephone Encounter (Signed)
-----   Message from Thayer Headings, MD sent at 01/29/2016  9:19 AM EDT ----- Please let him know his viral load is back down again so ok to just continue what he has and make sure to follow up when he moves.   THanks

## 2016-01-29 NOTE — Telephone Encounter (Signed)
Patient notified

## 2016-01-30 ENCOUNTER — Ambulatory Visit (HOSPITAL_BASED_OUTPATIENT_CLINIC_OR_DEPARTMENT_OTHER): Payer: BLUE CROSS/BLUE SHIELD

## 2016-01-30 ENCOUNTER — Other Ambulatory Visit (HOSPITAL_BASED_OUTPATIENT_CLINIC_OR_DEPARTMENT_OTHER): Payer: BLUE CROSS/BLUE SHIELD

## 2016-01-30 VITALS — BP 142/89 | HR 98 | Temp 98.0°F | Resp 18

## 2016-01-30 DIAGNOSIS — C3490 Malignant neoplasm of unspecified part of unspecified bronchus or lung: Secondary | ICD-10-CM

## 2016-01-30 DIAGNOSIS — C787 Secondary malignant neoplasm of liver and intrahepatic bile duct: Secondary | ICD-10-CM | POA: Diagnosis not present

## 2016-01-30 DIAGNOSIS — C7951 Secondary malignant neoplasm of bone: Secondary | ICD-10-CM

## 2016-01-30 DIAGNOSIS — Z5111 Encounter for antineoplastic chemotherapy: Secondary | ICD-10-CM | POA: Diagnosis not present

## 2016-01-30 DIAGNOSIS — C3491 Malignant neoplasm of unspecified part of right bronchus or lung: Secondary | ICD-10-CM

## 2016-01-30 DIAGNOSIS — Z95828 Presence of other vascular implants and grafts: Secondary | ICD-10-CM

## 2016-01-30 DIAGNOSIS — C3492 Malignant neoplasm of unspecified part of left bronchus or lung: Secondary | ICD-10-CM

## 2016-01-30 LAB — COMPREHENSIVE METABOLIC PANEL
ALBUMIN: 3.1 g/dL — AB (ref 3.5–5.0)
ALT: 17 U/L (ref 0–55)
AST: 25 U/L (ref 5–34)
Alkaline Phosphatase: 126 U/L (ref 40–150)
Anion Gap: 8 mEq/L (ref 3–11)
BILIRUBIN TOTAL: 0.31 mg/dL (ref 0.20–1.20)
BUN: 6.1 mg/dL — AB (ref 7.0–26.0)
CALCIUM: 9.3 mg/dL (ref 8.4–10.4)
CHLORIDE: 105 meq/L (ref 98–109)
CO2: 24 mEq/L (ref 22–29)
CREATININE: 0.8 mg/dL (ref 0.7–1.3)
EGFR: >90 mL/min/{1.73_m2} (ref >90)
Glucose: 107 mg/dl (ref 70–140)
Potassium: 4 mEq/L (ref 3.5–5.1)
SODIUM: 137 meq/L (ref 136–145)
TOTAL PROTEIN: 6.7 g/dL (ref 6.4–8.3)

## 2016-01-30 LAB — CBC WITH DIFFERENTIAL/PLATELET
BASO%: 1 % (ref 0.0–2.0)
Basophils Absolute: 0 10*3/uL (ref 0.0–0.1)
EOS%: 0.7 % (ref 0.0–7.0)
Eosinophils Absolute: 0 10*3/uL (ref 0.0–0.5)
HCT: 35 % — ABNORMAL LOW (ref 38.4–49.9)
HEMOGLOBIN: 11.8 g/dL — AB (ref 13.0–17.1)
LYMPH%: 20 % (ref 14.0–49.0)
MCH: 33.4 pg (ref 27.2–33.4)
MCHC: 33.8 g/dL (ref 32.0–36.0)
MCV: 99.1 fL — ABNORMAL HIGH (ref 79.3–98.0)
MONO#: 0.4 10*3/uL (ref 0.1–0.9)
MONO%: 9.7 % (ref 0.0–14.0)
NEUT%: 68.6 % (ref 39.0–75.0)
NEUTROS ABS: 2.9 10*3/uL (ref 1.5–6.5)
Platelets: 213 10*3/uL (ref 140–400)
RBC: 3.54 10*6/uL — ABNORMAL LOW (ref 4.20–5.82)
RDW: 18.9 % — AB (ref 11.0–14.6)
WBC: 4.2 10*3/uL (ref 4.0–10.3)
lymph#: 0.8 10*3/uL — ABNORMAL LOW (ref 0.9–3.3)

## 2016-01-30 MED ORDER — HEPARIN SOD (PORK) LOCK FLUSH 100 UNIT/ML IV SOLN
500.0000 [IU] | Freq: Once | INTRAVENOUS | Status: AC | PRN
  Administered 2016-01-30: 500 [IU]
  Filled 2016-01-30: qty 5

## 2016-01-30 MED ORDER — SODIUM CHLORIDE 0.9% FLUSH
10.0000 mL | INTRAVENOUS | Status: DC | PRN
  Administered 2016-01-30: 10 mL
  Filled 2016-01-30: qty 10

## 2016-01-30 MED ORDER — FOSAPREPITANT DIMEGLUMINE INJECTION 150 MG
Freq: Once | INTRAVENOUS | Status: AC
  Administered 2016-01-30: 11:00:00 via INTRAVENOUS
  Filled 2016-01-30: qty 5

## 2016-01-30 MED ORDER — IRINOTECAN HCL CHEMO INJECTION 100 MG/5ML
65.0000 mg/m2 | Freq: Once | INTRAVENOUS | Status: AC
  Administered 2016-01-30: 120 mg via INTRAVENOUS
  Filled 2016-01-30: qty 6

## 2016-01-30 MED ORDER — SODIUM CHLORIDE 0.9 % IJ SOLN
10.0000 mL | INTRAMUSCULAR | Status: DC | PRN
  Administered 2016-01-30: 10 mL via INTRAVENOUS
  Filled 2016-01-30: qty 10

## 2016-01-30 MED ORDER — SODIUM CHLORIDE 0.9 % IV SOLN
30.0000 mg/m2 | Freq: Once | INTRAVENOUS | Status: AC
  Administered 2016-01-30: 55 mg via INTRAVENOUS
  Filled 2016-01-30: qty 55

## 2016-01-30 MED ORDER — SODIUM CHLORIDE 0.9 % IV SOLN
Freq: Once | INTRAVENOUS | Status: AC
  Administered 2016-01-30: 10:00:00 via INTRAVENOUS

## 2016-01-30 MED ORDER — PALONOSETRON HCL INJECTION 0.25 MG/5ML
INTRAVENOUS | Status: AC
Start: 2016-01-30 — End: 2016-01-30
  Filled 2016-01-30: qty 5

## 2016-01-30 MED ORDER — ATROPINE SULFATE 1 MG/ML IJ SOLN
INTRAMUSCULAR | Status: AC
Start: 2016-01-30 — End: 2016-01-30
  Filled 2016-01-30: qty 1

## 2016-01-30 MED ORDER — PALONOSETRON HCL INJECTION 0.25 MG/5ML
0.2500 mg | Freq: Once | INTRAVENOUS | Status: AC
  Administered 2016-01-30: 0.25 mg via INTRAVENOUS

## 2016-01-30 MED ORDER — ATROPINE SULFATE 1 MG/ML IJ SOLN
0.5000 mg | Freq: Once | INTRAMUSCULAR | Status: AC | PRN
  Administered 2016-01-30: 0.5 mg via INTRAVENOUS

## 2016-01-30 MED ORDER — POTASSIUM CHLORIDE 2 MEQ/ML IV SOLN
Freq: Once | INTRAVENOUS | Status: AC
  Administered 2016-01-30: 09:00:00 via INTRAVENOUS
  Filled 2016-01-30: qty 10

## 2016-01-30 NOTE — Patient Instructions (Signed)
[facility] Discharge Instructions for Patients Receiving Chemotherapy  Today you received the following chemotherapy agents Irinotecan/Cisplatin  To help prevent nausea and vomiting after your treatment, we encourage you to take your nausea medication    If you develop nausea and vomiting that is not controlled by your nausea medication, call the clinic.   BELOW ARE SYMPTOMS THAT SHOULD BE REPORTED IMMEDIATELY:  *FEVER GREATER THAN 100.5 F  *CHILLS WITH OR WITHOUT FEVER  NAUSEA AND VOMITING THAT IS NOT CONTROLLED WITH YOUR NAUSEA MEDICATION  *UNUSUAL SHORTNESS OF BREATH  *UNUSUAL BRUISING OR BLEEDING  TENDERNESS IN MOUTH AND THROAT WITH OR WITHOUT PRESENCE OF ULCERS  *URINARY PROBLEMS  *BOWEL PROBLEMS  UNUSUAL RASH Items with * indicate a potential emergency and should be followed up as soon as possible.  Feel free to call the clinic you have any questions or concerns. The clinic phone number is [phone].  Please show the CHEMO ALERT CARD at check-in to the Emergency Department and triage nurse.   

## 2016-01-30 NOTE — Patient Instructions (Signed)

## 2016-01-31 ENCOUNTER — Other Ambulatory Visit: Payer: BLUE CROSS/BLUE SHIELD

## 2016-02-10 ENCOUNTER — Inpatient Hospital Stay (HOSPITAL_COMMUNITY)
Admission: EM | Admit: 2016-02-10 | Discharge: 2016-02-12 | DRG: 975 | Disposition: A | Discharge status: Home / Self Care | Payer: BLUE CROSS/BLUE SHIELD | Attending: Internal Medicine | Admitting: Internal Medicine

## 2016-02-10 ENCOUNTER — Encounter (HOSPITAL_COMMUNITY): Payer: Self-pay | Admitting: Nurse Practitioner

## 2016-02-10 ENCOUNTER — Emergency Department (HOSPITAL_COMMUNITY): Payer: BLUE CROSS/BLUE SHIELD

## 2016-02-10 DIAGNOSIS — Z79899 Other long term (current) drug therapy: Secondary | ICD-10-CM

## 2016-02-10 DIAGNOSIS — Z7901 Long term (current) use of anticoagulants: Secondary | ICD-10-CM

## 2016-02-10 DIAGNOSIS — D649 Anemia, unspecified: Secondary | ICD-10-CM | POA: Diagnosis present

## 2016-02-10 DIAGNOSIS — J439 Emphysema, unspecified: Secondary | ICD-10-CM | POA: Diagnosis present

## 2016-02-10 DIAGNOSIS — A419 Sepsis, unspecified organism: Principal | ICD-10-CM | POA: Diagnosis present

## 2016-02-10 DIAGNOSIS — N390 Urinary tract infection, site not specified: Secondary | ICD-10-CM | POA: Diagnosis present

## 2016-02-10 DIAGNOSIS — J440 Chronic obstructive pulmonary disease with acute lower respiratory infection: Secondary | ICD-10-CM | POA: Diagnosis present

## 2016-02-10 DIAGNOSIS — R32 Unspecified urinary incontinence: Secondary | ICD-10-CM | POA: Diagnosis present

## 2016-02-10 DIAGNOSIS — E876 Hypokalemia: Secondary | ICD-10-CM | POA: Diagnosis not present

## 2016-02-10 DIAGNOSIS — G4700 Insomnia, unspecified: Secondary | ICD-10-CM | POA: Diagnosis present

## 2016-02-10 DIAGNOSIS — Z681 Body mass index (BMI) 19 or less, adult: Secondary | ICD-10-CM

## 2016-02-10 DIAGNOSIS — Z91011 Allergy to milk products: Secondary | ICD-10-CM

## 2016-02-10 DIAGNOSIS — N10 Acute tubulo-interstitial nephritis: Secondary | ICD-10-CM

## 2016-02-10 DIAGNOSIS — D696 Thrombocytopenia, unspecified: Secondary | ICD-10-CM | POA: Diagnosis present

## 2016-02-10 DIAGNOSIS — N12 Tubulo-interstitial nephritis, not specified as acute or chronic: Secondary | ICD-10-CM | POA: Diagnosis present

## 2016-02-10 DIAGNOSIS — C3490 Malignant neoplasm of unspecified part of unspecified bronchus or lung: Secondary | ICD-10-CM | POA: Diagnosis present

## 2016-02-10 DIAGNOSIS — B20 Human immunodeficiency virus [HIV] disease: Secondary | ICD-10-CM | POA: Diagnosis present

## 2016-02-10 DIAGNOSIS — R634 Abnormal weight loss: Secondary | ICD-10-CM | POA: Diagnosis present

## 2016-02-10 DIAGNOSIS — D630 Anemia in neoplastic disease: Secondary | ICD-10-CM | POA: Diagnosis present

## 2016-02-10 DIAGNOSIS — Z923 Personal history of irradiation: Secondary | ICD-10-CM

## 2016-02-10 DIAGNOSIS — F17200 Nicotine dependence, unspecified, uncomplicated: Secondary | ICD-10-CM | POA: Diagnosis present

## 2016-02-10 DIAGNOSIS — J189 Pneumonia, unspecified organism: Secondary | ICD-10-CM

## 2016-02-10 DIAGNOSIS — Z86711 Personal history of pulmonary embolism: Secondary | ICD-10-CM

## 2016-02-10 LAB — COMPREHENSIVE METABOLIC PANEL
ALBUMIN: 3.4 g/dL — AB (ref 3.5–5.0)
ALK PHOS: 124 U/L (ref 38–126)
ALT: 14 U/L — AB (ref 17–63)
AST: 19 U/L (ref 15–41)
Anion gap: 6 (ref 5–15)
BILIRUBIN TOTAL: 0.5 mg/dL (ref 0.3–1.2)
BUN: <5 mg/dL — ABNORMAL LOW (ref 6–20)
CO2: 25 mmol/L (ref 22–32)
Calcium: 8.7 mg/dL — ABNORMAL LOW (ref 8.9–10.3)
Chloride: 104 mmol/L (ref 101–111)
Creatinine, Ser: 0.68 mg/dL (ref 0.61–1.24)
GFR calc Af Amer: >60 mL/min (ref >60)
GLUCOSE: 107 mg/dL — AB (ref 65–99)
POTASSIUM: 3.7 mmol/L (ref 3.5–5.1)
Sodium: 135 mmol/L (ref 135–145)
TOTAL PROTEIN: 6.5 g/dL (ref 6.5–8.1)

## 2016-02-10 LAB — CBC WITH DIFFERENTIAL/PLATELET
BASOS PCT: 0 %
Basophils Absolute: 0 10*3/uL (ref 0.0–0.1)
Eosinophils Absolute: 0 10*3/uL (ref 0.0–0.7)
Eosinophils Relative: 0 %
HEMATOCRIT: 29.9 % — AB (ref 39.0–52.0)
HEMOGLOBIN: 10.5 g/dL — AB (ref 13.0–17.0)
Lymphocytes Relative: 11 %
Lymphs Abs: 0.5 10*3/uL — ABNORMAL LOW (ref 0.7–4.0)
MCH: 34 pg (ref 26.0–34.0)
MCHC: 35.1 g/dL (ref 30.0–36.0)
MCV: 96.8 fL (ref 78.0–100.0)
MONOS PCT: 11 %
Monocytes Absolute: 0.6 10*3/uL (ref 0.1–1.0)
NEUTROS ABS: 4 10*3/uL (ref 1.7–7.7)
NEUTROS PCT: 78 %
Platelets: 164 10*3/uL (ref 150–400)
RBC: 3.09 MIL/uL — ABNORMAL LOW (ref 4.22–5.81)
RDW: 19.1 % — ABNORMAL HIGH (ref 11.5–15.5)
WBC: 5.1 10*3/uL (ref 4.0–10.5)

## 2016-02-10 LAB — I-STAT CG4 LACTIC ACID, ED: Lactic Acid, Venous: 1.34 mmol/L (ref 0.5–1.9)

## 2016-02-10 LAB — PROTIME-INR
INR: 1.43
Prothrombin Time: 17.6 seconds — ABNORMAL HIGH (ref 11.4–15.2)

## 2016-02-10 MED ORDER — SODIUM CHLORIDE 0.9 % IV BOLUS (SEPSIS)
1000.0000 mL | Freq: Once | INTRAVENOUS | Status: AC
  Administered 2016-02-10: 1000 mL via INTRAVENOUS

## 2016-02-10 MED ORDER — ACETAMINOPHEN 325 MG PO TABS
650.0000 mg | ORAL_TABLET | Freq: Once | ORAL | Status: AC | PRN
  Administered 2016-02-10: 650 mg via ORAL
  Filled 2016-02-10: qty 2

## 2016-02-10 NOTE — ED Notes (Signed)
Blood cultures have been drawn both sets

## 2016-02-10 NOTE — ED Provider Notes (Signed)
[facility] DEPT Provider Note   CSN: [id number] Arrival date & time: 02/10/16  2115     History   Chief Complaint Chief Complaint  Patient presents with  . Fever    CA pt   . Dysuria  . Hematuria    HPI Jaime Morris is a 55 y.o. male.  HPI  55 year old male who presents with fever and dysuria. 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 He reports onset of dysuria after urinating 4 days ago. In the days after that has noted drops of blood in his urine. Today developed a fever of 102.4 Fahrenheit and was encouraged to come to the ED for evaluation by the cancer nurse on call. He is also endorsed increased dry hacking cough over the past 24 hours. Denies difficulty breathing or chest pain, leg swelling or leg pain, abdominal pain, nausea or vomiting. Reports baseline diarrhea after chemotherapy. Last dose CTX 02/03/2016   Past Medical History:  Diagnosis Date  . Collapsed lung    d/t biopsy, right  . COPD (chronic obstructive pulmonary disease) ([place])    PFT'S 10/26/13 @ [place]  . Encounter for antineoplastic chemotherapy 10/08/2015  . Gynecomastia, male 10/16/13   MILD RIGHT SIDED PER RADIOLOGY STUDY  . History of depression   . History of pneumonia   . HIV infection ([place])    DR. [name]  . Hyperlipidemia   . Insomnia   . Lung nodule, multiple 10/24/13   CT CHEST W/O...DR. [name]  . Radiation 06/05/14-06/20/14   whole brain 25 gray  . Radiation 11/26/15-12/09/15   sacrum 30 Gy  . Small cell lung cancer [facility])     Patient Active Problem List   Diagnosis Date Noted  . Sepsis ([place]) 02/11/2016  . UTI (lower urinary tract infection) 02/11/2016  . Normocytic anemia 02/11/2016  . Osseous metastasis ([place]) 11/20/2015  . Port catheter in place 10/08/2015  . Encounter for antineoplastic chemotherapy 10/08/2015  . Small cell carcinoma of lung, unspecified laterality ([place]) 09/25/2014  . Encounter for long-term (current) use of  medications 09/18/2014  . Loss of weight 09/18/2014  . History of pulmonary embolism 05/14/2014  . Diarrhea 03/13/2014  . Fatigue 03/13/2014  . Cramping of hands 02/21/2014  . Small cell carcinoma of lung ([place]) 12/07/2013  . COPD (chronic obstructive pulmonary disease) with emphysema ([place])   . Gynecomastia, male   . Lung nodule, multiple 10/24/2013  . SMOKER 08/04/2010  . INSOMNIA 03/07/2010  . ALCOHOL ABUSE, HX OF 03/07/2010  . Human immunodeficiency virus (HIV) disease ([place]) 02/18/2010  . HSV 02/18/2010  . DEPRESSION 02/18/2010  . ECZEMA, ATOPIC 02/18/2010  . ACQUIRED SPONDYLOLISTHESIS 02/18/2010  . TB SKIN TEST, POSITIVE 02/18/2010    Past Surgical History:  Procedure Laterality Date  . BACK SURGERY     L 4-5 FUSION WITH SCREW  . COLONOSCOPY  UTD  . FOOT SURGERY     BUNIONECTOMY  . PORTACATH PLACEMENT N/A 12/08/2013   Procedure: INSERTION PORT-A-CATH;  Surgeon: Grace Isaac, MD;  Location[facility] OR;  Service: Thoracic;  Laterality: N/A;       Home Medications    Prior to Admission medications   Medication Sig Start Date End Date Taking? Authorizing Provider  albuterol (PROAIR HFA) 108 (90 BASE) MCG/ACT inhaler Inhale 2 puffs into the lungs every 4 (four) hours as needed for wheezing or shortness of breath.    Yes Historical Provider, MD  doxylamine, Sleep, (UNISOM) 25  MG tablet Take 25 mg by mouth at bedtime as needed for sleep.    Yes Historical Provider, MD  dronabinol (MARINOL) 10 MG capsule Take 10 mg by mouth 2 (two) times daily before a meal.   Yes Historical Provider, MD  efavirenz-emtricitabine-tenofovir (ATRIPLA) 600-200-300 MG tablet Take 1 tablet by mouth at bedtime.   Yes Historical Provider, MD  lidocaine-prilocaine (EMLA) cream Apply 1 application topically as needed (prior to accessing port).   Yes Historical Provider, MD  loperamide (IMODIUM) 2 MG capsule Take 2 mg by mouth as needed for diarrhea or loose stools.    Yes Historical Provider, MD    Melatonin 10 MG TABS Take 10 mg by mouth at bedtime as needed (for sleep).    Yes Historical Provider, MD  Multiple Vitamin (MULTIVITAMIN WITH MINERALS) TABS tablet Take 1 tablet by mouth at bedtime.   Yes Historical Provider, MD  Nutritional Supplements (HIGH-PROTEIN NUTRITIONAL SHAKE) LIQD Take 1 Bottle by mouth 2 (two) times daily between meals.    Yes Historical Provider, MD  oxyCODONE-acetaminophen (PERCOCET/ROXICET) 5-325 MG tablet Take 1 tablet by mouth every 6 (six) hours as needed for severe pain. 09/17/15  Yes Curt Bears, MD  prochlorperazine (COMPAZINE) 10 MG tablet Take 1 tablet (10 mg total) by mouth every 6 (six) hours as needed for nausea or vomiting. 12/19/15  Yes Curt Bears, MD  tolterodine (DETROL LA) 4 MG 24 hr capsule Take 4 mg by mouth at bedtime.    Yes Historical Provider, MD  traZODone (DESYREL) 100 MG tablet Take 100 mg by mouth at bedtime.   Yes Historical Provider, MD  vitamin B-12 (CYANOCOBALAMIN) 1000 MCG tablet Take 2,000 mcg by mouth at bedtime.    Yes Historical Provider, MD  warfarin (COUMADIN) 2.5 MG tablet Take 2.5 mg by mouth See admin instructions. Pt takes this dose on Sunday, Tuesday, Wednesday, Friday, and Saturday.   Yes Historical Provider, MD  warfarin (COUMADIN) 5 MG tablet Take 5 mg by mouth 2 (two) times a week. Pt takes this dose on Monday and Thursday.   Yes Historical Provider, MD    Family History Family History  Problem Relation Age of Onset  . Stroke Mother   . Heart disease Father   . Sarcoidosis Sister   . Melanoma Paternal Grandfather     Social History Social History  Substance Use Topics  . Smoking status: Current Some Day Smoker    Packs/day: 0.50    Years: 38.00    Types: Cigarettes    Last attempt to quit: 04/17/2014  . Smokeless tobacco: Current User    Types: Chew    Last attempt to quit: 12/18/2013     Comment: recently restarted (08/13/14)  . Alcohol use No     Comment: in alcohol treatment/04/20/13      Allergies   Lactose intolerance (gi)   Review of Systems Review of Systems 10/14 systems reviewed and are negative other than those stated in the HPI  Physical Exam Updated Vital Signs BP 129/63 (BP Location: Right Arm)   Pulse 97   Temp 99.2 F (37.3 C) (Oral)   Resp 18   Ht '5\' 9"'$  (1.753 m)   Wt 134 lb (60.8 kg)   SpO2 99%   BMI 19.79 kg/m   Physical Exam  Physical Exam  Nursing note and vitals reviewed. Constitutional: Well developed, thin, non-toxic, and in no acute distress Head: Normocephalic and atraumatic.  Mouth/Throat: Oropharynx is clear and moist.  Neck: Normal range of motion. Neck  supple.  Cardiovascular: Tachycardic rate and regular rhythm.   Pulmonary/Chest: Effort normal and breath sounds normal.  Abdominal: Soft. There is no tenderness. There is no rebound and no guarding. No CVA tenderness. Musculoskeletal: Normal range of motion.  Neurological: Alert, no facial droop, fluent speech, moves all extremities symmetrically Skin: Skin is warm and dry.  Psychiatric: Cooperative  ED Treatments / Results  Labs (all labs ordered are listed, but only abnormal results are displayed) Labs Reviewed  COMPREHENSIVE METABOLIC PANEL - Abnormal; Notable for the following:       Result Value   Glucose, Bld 107 (*)    BUN <5 (*)    Calcium 8.7 (*)    Albumin 3.4 (*)    ALT 14 (*)    All other components within normal limits  URINALYSIS, ROUTINE W REFLEX MICROSCOPIC (NOT AT [facility]) - Abnormal; Notable for the following:    APPearance CLOUDY (*)    Hgb urine dipstick TRACE (*)    Leukocytes, UA MODERATE (*)    All other components within normal limits  CBC WITH DIFFERENTIAL/PLATELET - Abnormal; Notable for the following:    RBC 3.09 (*)    Hemoglobin 10.5 (*)    HCT 29.9 (*)    RDW 19.1 (*)    Lymphs Abs 0.5 (*)    All other components within normal limits  PROTIME-INR - Abnormal; Notable for the following:    Prothrombin Time 17.6 (*)    All other  components within normal limits  URINE MICROSCOPIC-ADD ON - Abnormal; Notable for the following:    Squamous Epithelial / LPF 0-5 (*)    Bacteria, UA RARE (*)    All other components within normal limits  CULTURE, BLOOD (ROUTINE X 2)  CULTURE, BLOOD (ROUTINE X 2)  URINE CULTURE  CBC  BASIC METABOLIC PANEL  I-STAT CG4 LACTIC ACID, ED  I-STAT CG4 LACTIC ACID, ED    EKG  EKG Interpretation None       Radiology Dg Chest 2 View  Result Date: 02/10/2016 CLINICAL DATA:  Patient is being treated for small cell lung cancer. Woke up this afternoon with a fever. Emphysema. Smoker. EXAM: CHEST  2 VIEW COMPARISON:  CT chest 12/31/2015.  Chest 11/14/2015. FINDINGS: Central venous catheter with tip over the low SVC region. No pneumothorax. Normal heart size and pulmonary vascularity. Emphysematous changes and central fibrosis in the lungs. Scarring in the right hilum with increasing infiltration around the right hilum since previous study. This may be related to tumor or its treatment or could indicate developing pneumonia. Focal 10 mm nodule in the right mid lung. No blunting of costophrenic angles. No pneumothorax. Mediastinal contours appear intact. Degenerative changes in the spine. IMPRESSION: Developing right perihilar infiltration may indicate pneumonia or may represent posttreatment changes. Focal nodular opacity in the right mid lung measuring 1 cm diameter. Diffuse emphysematous changes and fibrosis in the lungs with chronic bronchitic changes present. Electronically Signed   By: Lucienne Capers M.D.   On: 02/10/2016 23:25    Procedures Procedures (including critical care time)  Medications Ordered in ED Medications  ceFEPIme (MAXIPIME) 1 g in dextrose 5 % 50 mL IVPB (0 g Intravenous Stopped 02/11/16 0130)  ondansetron (ZOFRAN) tablet 4 mg (not administered)    Or  ondansetron (ZOFRAN) injection 4 mg (not administered)  acetaminophen (TYLENOL) tablet 650 mg (not administered)    Or   acetaminophen (TYLENOL) suppository 650 mg (not administered)  0.9 %  sodium chloride infusion (not administered)  dronabinol (  MARINOL) capsule 10 mg (not administered)  oxyCODONE-acetaminophen (PERCOCET/ROXICET) 5-325 MG per tablet 1 tablet (not administered)  loperamide (IMODIUM) capsule 2 mg (not administered)  feeding supplement (ENSURE ENLIVE) (ENSURE ENLIVE) liquid 237 mL (not administered)  albuterol (PROVENTIL) (2.5 MG/3ML) 0.083% nebulizer solution 2.5 mg (not administered)  traZODone (DESYREL) tablet 100 mg (not administered)  efavirenz-emtricitabine-tenofovir (ATRIPLA) 600-200-300 MG per tablet 1 tablet (not administered)  acetaminophen (TYLENOL) tablet 650 mg (650 mg Oral Given 02/10/16 2311)  sodium chloride 0.9 % bolus 1,000 mL (0 mLs Intravenous Stopped 02/11/16 0209)  sodium chloride 0.9 % bolus 1,000 mL (0 mLs Intravenous Stopped 02/11/16 0209)  vancomycin (VANCOCIN) IVPB 1000 mg/200 mL premix (0 mg Intravenous Stopped 02/11/16 0151)     Initial Impression / Assessment and Plan / ED Course  I have reviewed the triage vital signs and the nursing notes.  Pertinent labs & imaging results that were available during my care of the patient were reviewed by me and considered in my medical decision making (see chart for details).  Clinical Course    55 year old male with history of stage IV squamous cell Carcinoma of the lung on systemic chemotherapy and PE on Coumadin who presents with fever, dysuria and cough. He is nontoxic in no acute distress on presentation. His febrile, tachycardic but normotensive and in no respiratory distress. Sepsis workup is pursued. He has evidence of a urinary tract infection as well as potential right perihilar infiltrate suggestive of pneumonia. Was last hospitalized in June 2017 for pneumonia. We'll treat for both UTI and hospital-acquired pneumonia with cefepime and vancomycin. Fever and tachycardia improved after IV fluids and Tylenol. He has no  significant leukocytosis or elevated lactic acid. No significant engorgement damage. Blood cultures and urine cultures pending. Given his immune suppression recent hospitalization, he is admitted for observation to Dr. Loleta Books.  Final Clinical Impressions(s) / ED Diagnoses   Final diagnoses:  UTI (lower urinary tract infection)  HCAP (healthcare-associated pneumonia)    New Prescriptions Current Discharge Medication List       Forde Dandy, MD 02/11/16 [phone]

## 2016-02-10 NOTE — ED Triage Notes (Signed)
Pt is [place] pt who presents with c/o dysuria, hematuria and fever 102.4 at when when he called the cancer nurse on call who advised pt to come in for further evaluation.

## 2016-02-11 ENCOUNTER — Telehealth: Payer: Self-pay | Admitting: Medical Oncology

## 2016-02-11 ENCOUNTER — Encounter (HOSPITAL_COMMUNITY): Payer: Self-pay | Admitting: Family Medicine

## 2016-02-11 ENCOUNTER — Other Ambulatory Visit: Payer: Self-pay | Admitting: Medical Oncology

## 2016-02-11 ENCOUNTER — Encounter (HOSPITAL_COMMUNITY): Payer: Self-pay

## 2016-02-11 DIAGNOSIS — Z86711 Personal history of pulmonary embolism: Secondary | ICD-10-CM

## 2016-02-11 DIAGNOSIS — J440 Chronic obstructive pulmonary disease with acute lower respiratory infection: Secondary | ICD-10-CM | POA: Diagnosis present

## 2016-02-11 DIAGNOSIS — R634 Abnormal weight loss: Secondary | ICD-10-CM | POA: Diagnosis present

## 2016-02-11 DIAGNOSIS — J439 Emphysema, unspecified: Secondary | ICD-10-CM | POA: Diagnosis not present

## 2016-02-11 DIAGNOSIS — A419 Sepsis, unspecified organism: Secondary | ICD-10-CM | POA: Diagnosis present

## 2016-02-11 DIAGNOSIS — N390 Urinary tract infection, site not specified: Secondary | ICD-10-CM | POA: Diagnosis present

## 2016-02-11 DIAGNOSIS — G4700 Insomnia, unspecified: Secondary | ICD-10-CM | POA: Diagnosis present

## 2016-02-11 DIAGNOSIS — Z923 Personal history of irradiation: Secondary | ICD-10-CM | POA: Diagnosis not present

## 2016-02-11 DIAGNOSIS — B20 Human immunodeficiency virus [HIV] disease: Secondary | ICD-10-CM | POA: Diagnosis present

## 2016-02-11 DIAGNOSIS — R32 Unspecified urinary incontinence: Secondary | ICD-10-CM | POA: Diagnosis present

## 2016-02-11 DIAGNOSIS — N12 Tubulo-interstitial nephritis, not specified as acute or chronic: Secondary | ICD-10-CM | POA: Diagnosis present

## 2016-02-11 DIAGNOSIS — F17200 Nicotine dependence, unspecified, uncomplicated: Secondary | ICD-10-CM | POA: Diagnosis present

## 2016-02-11 DIAGNOSIS — Z79899 Other long term (current) drug therapy: Secondary | ICD-10-CM | POA: Diagnosis not present

## 2016-02-11 DIAGNOSIS — E876 Hypokalemia: Secondary | ICD-10-CM | POA: Diagnosis not present

## 2016-02-11 DIAGNOSIS — C3492 Malignant neoplasm of unspecified part of left bronchus or lung: Secondary | ICD-10-CM

## 2016-02-11 DIAGNOSIS — D696 Thrombocytopenia, unspecified: Secondary | ICD-10-CM | POA: Diagnosis present

## 2016-02-11 DIAGNOSIS — C3490 Malignant neoplasm of unspecified part of unspecified bronchus or lung: Secondary | ICD-10-CM | POA: Diagnosis present

## 2016-02-11 DIAGNOSIS — D630 Anemia in neoplastic disease: Secondary | ICD-10-CM | POA: Diagnosis present

## 2016-02-11 DIAGNOSIS — N10 Acute tubulo-interstitial nephritis: Secondary | ICD-10-CM

## 2016-02-11 DIAGNOSIS — Z7901 Long term (current) use of anticoagulants: Secondary | ICD-10-CM | POA: Diagnosis not present

## 2016-02-11 DIAGNOSIS — Z681 Body mass index (BMI) 19 or less, adult: Secondary | ICD-10-CM | POA: Diagnosis not present

## 2016-02-11 DIAGNOSIS — Z91011 Allergy to milk products: Secondary | ICD-10-CM | POA: Diagnosis not present

## 2016-02-11 DIAGNOSIS — D649 Anemia, unspecified: Secondary | ICD-10-CM

## 2016-02-11 LAB — PROTIME-INR
INR: 1.29
PROTHROMBIN TIME: 16.2 s — AB (ref 11.4–15.2)

## 2016-02-11 LAB — URINALYSIS, ROUTINE W REFLEX MICROSCOPIC
Bilirubin Urine: NEGATIVE
GLUCOSE, UA: NEGATIVE mg/dL
KETONES UR: NEGATIVE mg/dL
NITRITE: NEGATIVE
PROTEIN: NEGATIVE mg/dL
Specific Gravity, Urine: 1.009 (ref 1.005–1.030)
pH: 7 (ref 5.0–8.0)

## 2016-02-11 LAB — BASIC METABOLIC PANEL
Anion gap: 5 (ref 5–15)
CHLORIDE: 108 mmol/L (ref 101–111)
CO2: 23 mmol/L (ref 22–32)
CREATININE: 0.66 mg/dL (ref 0.61–1.24)
Calcium: 8.4 mg/dL — ABNORMAL LOW (ref 8.9–10.3)
Glucose, Bld: 124 mg/dL — ABNORMAL HIGH (ref 65–99)
POTASSIUM: 3.3 mmol/L — AB (ref 3.5–5.1)
SODIUM: 136 mmol/L (ref 135–145)

## 2016-02-11 LAB — I-STAT CG4 LACTIC ACID, ED: Lactic Acid, Venous: 0.94 mmol/L (ref 0.5–1.9)

## 2016-02-11 LAB — URINE MICROSCOPIC-ADD ON

## 2016-02-11 LAB — C DIFFICILE QUICK SCREEN W PCR REFLEX
C Diff antigen: NEGATIVE
C Diff interpretation: NOT DETECTED
C Diff toxin: NEGATIVE

## 2016-02-11 LAB — CBC
HCT: 29.7 % — ABNORMAL LOW (ref 39.0–52.0)
HEMOGLOBIN: 10.3 g/dL — AB (ref 13.0–17.0)
MCH: 34.2 pg — ABNORMAL HIGH (ref 26.0–34.0)
MCHC: 34.7 g/dL (ref 30.0–36.0)
MCV: 98.7 fL (ref 78.0–100.0)
PLATELETS: 139 10*3/uL — AB (ref 150–400)
RBC: 3.01 MIL/uL — AB (ref 4.22–5.81)
RDW: 19 % — ABNORMAL HIGH (ref 11.5–15.5)
WBC: 4.3 10*3/uL (ref 4.0–10.5)

## 2016-02-11 MED ORDER — LOPERAMIDE HCL 2 MG PO CAPS: 2.0000 mg | ORAL_CAPSULE | ORAL | Status: DC | PRN

## 2016-02-11 MED ORDER — EFAVIRENZ-EMTRICITAB-TENOFOVIR 600-200-300 MG PO TABS: 1.0000 | ORAL_TABLET | Freq: Every day | ORAL | Status: DC

## 2016-02-11 MED ORDER — PROSIGHT PO TABS
1.0000 | ORAL_TABLET | Freq: Every day | ORAL | Status: DC
  Filled 2016-02-11: qty 1

## 2016-02-11 MED ORDER — WARFARIN - PHARMACIST DOSING INPATIENT: Freq: Every day | Status: DC

## 2016-02-11 MED ORDER — ACETAMINOPHEN 650 MG RE SUPP
650.0000 mg | Freq: Four times a day (QID) | RECTAL | Status: DC | PRN
Start: 2016-02-11 — End: 2016-02-12

## 2016-02-11 MED ORDER — ONDANSETRON HCL 4 MG/2ML IJ SOLN: 4.0000 mg | Freq: Four times a day (QID) | INTRAMUSCULAR | Status: DC | PRN

## 2016-02-11 MED ORDER — ENSURE ENLIVE PO LIQD
1.0000 | Freq: Two times a day (BID) | ORAL | Status: DC
  Administered 2016-02-11 – 2016-02-12 (×3): 237 mL via ORAL

## 2016-02-11 MED ORDER — POTASSIUM CHLORIDE CRYS ER 20 MEQ PO TBCR
40.0000 meq | EXTENDED_RELEASE_TABLET | ORAL | Status: AC
  Administered 2016-02-11 (×2): 40 meq via ORAL
  Filled 2016-02-11 (×2): qty 2

## 2016-02-11 MED ORDER — MELATONIN 10 MG PO TABS: 10.0000 mg | ORAL_TABLET | Freq: Every evening | ORAL | Status: DC | PRN

## 2016-02-11 MED ORDER — WARFARIN SODIUM 5 MG PO TABS
5.0000 mg | ORAL_TABLET | Freq: Once | ORAL | Status: AC
  Administered 2016-02-11: 5 mg via ORAL
  Filled 2016-02-11: qty 1

## 2016-02-11 MED ORDER — TRAZODONE HCL 100 MG PO TABS: 100.0000 mg | ORAL_TABLET | Freq: Every day | ORAL | Status: DC

## 2016-02-11 MED ORDER — VANCOMYCIN HCL IN DEXTROSE 1-5 GM/200ML-% IV SOLN
1000.0000 mg | Freq: Once | INTRAVENOUS | Status: AC
  Administered 2016-02-11: 1000 mg via INTRAVENOUS
  Filled 2016-02-11: qty 200

## 2016-02-11 MED ORDER — DEXTROSE 5 % IV SOLN
1.0000 g | Freq: Three times a day (TID) | INTRAVENOUS | Status: DC
  Administered 2016-02-11 – 2016-02-12 (×5): 1 g via INTRAVENOUS
  Filled 2016-02-11 (×6): qty 1

## 2016-02-11 MED ORDER — EFAVIRENZ-EMTRICITAB-TENOFOVIR 600-200-300 MG PO TABS
1.0000 | ORAL_TABLET | Freq: Every day | ORAL | Status: DC
  Administered 2016-02-11 (×2): 1 via ORAL
  Filled 2016-02-11 (×2): qty 1

## 2016-02-11 MED ORDER — ONDANSETRON HCL 4 MG PO TABS: 4.0000 mg | ORAL_TABLET | Freq: Four times a day (QID) | ORAL | Status: DC | PRN

## 2016-02-11 MED ORDER — VITAMIN B-12 1000 MCG PO TABS
2000.0000 ug | ORAL_TABLET | Freq: Every day | ORAL | Status: DC
  Filled 2016-02-11: qty 2

## 2016-02-11 MED ORDER — TRAZODONE HCL 100 MG PO TABS
100.0000 mg | ORAL_TABLET | Freq: Every day | ORAL | Status: DC
  Administered 2016-02-11: 100 mg via ORAL
  Filled 2016-02-11 (×2): qty 1

## 2016-02-11 MED ORDER — SODIUM CHLORIDE 0.9 % IV SOLN
INTRAVENOUS | Status: AC
  Administered 2016-02-11: 04:00:00 via INTRAVENOUS

## 2016-02-11 MED ORDER — ENOXAPARIN SODIUM 60 MG/0.6ML ~~LOC~~ SOLN
1.0000 mg/kg | Freq: Once | SUBCUTANEOUS | Status: AC
  Administered 2016-02-11: 60 mg via SUBCUTANEOUS
  Filled 2016-02-11: qty 0.6

## 2016-02-11 MED ORDER — FESOTERODINE FUMARATE ER 8 MG PO TB24
8.0000 mg | ORAL_TABLET | Freq: Every day | ORAL | Status: DC
  Administered 2016-02-11 – 2016-02-12 (×2): 8 mg via ORAL
  Filled 2016-02-11 (×2): qty 1

## 2016-02-11 MED ORDER — ACETAMINOPHEN 325 MG PO TABS: 650.0000 mg | ORAL_TABLET | Freq: Four times a day (QID) | ORAL | Status: DC | PRN

## 2016-02-11 MED ORDER — OXYCODONE-ACETAMINOPHEN 5-325 MG PO TABS: 1.0000 | ORAL_TABLET | Freq: Four times a day (QID) | ORAL | Status: DC | PRN

## 2016-02-11 MED ORDER — ALBUTEROL SULFATE (2.5 MG/3ML) 0.083% IN NEBU: 2.5000 mg | INHALATION_SOLUTION | Freq: Four times a day (QID) | RESPIRATORY_TRACT | Status: DC | PRN

## 2016-02-11 MED ORDER — VANCOMYCIN HCL IN DEXTROSE 750-5 MG/150ML-% IV SOLN: 750.0000 mg | Freq: Three times a day (TID) | INTRAVENOUS | Status: DC

## 2016-02-11 MED ORDER — DRONABINOL 5 MG PO CAPS
10.0000 mg | ORAL_CAPSULE | Freq: Two times a day (BID) | ORAL | Status: DC
  Administered 2016-02-11 – 2016-02-12 (×3): 10 mg via ORAL
  Filled 2016-02-11 (×4): qty 2

## 2016-02-11 MED ORDER — DOXYLAMINE SUCCINATE (SLEEP) 25 MG PO TABS
25.0000 mg | ORAL_TABLET | Freq: Every evening | ORAL | Status: DC | PRN
  Filled 2016-02-11: qty 1

## 2016-02-11 MED ORDER — ENOXAPARIN SODIUM 60 MG/0.6ML ~~LOC~~ SOLN
1.0000 mg/kg | Freq: Two times a day (BID) | SUBCUTANEOUS | Status: DC
  Administered 2016-02-12: 60 mg via SUBCUTANEOUS
  Filled 2016-02-11: qty 0.6

## 2016-02-11 NOTE — Progress Notes (Addendum)
[facility]   Jaime Morris is a 55 y.o. male with a past medical history significant for HIV on Atripla, metastatic SCLC on second line carboplatin/irinotecan, and hx of PE on warfarin who presents with fever of 100.7, dysuria, hematuira and right flank pain  Principal Problem:   Sepsis due to pyelonephritis - right sided flank pain, + UA, fever - cultures pending- temp down to 99- cont Cefepime -has not taken Detrol in 2 days- will resume Detrol for now - obtain post void residual tomorrow to determine if he is retaining urine  Active Problems:   Human immunodeficiency virus (HIV) disease   Ref. Range 01/27/2016 10:32  HIV 1 RNA Quant Latest Ref Range: <20 copies/mL 73 (H)  HIV1 RNA Quant, Log Latest Ref Range: <1.30 Log copies/mL 1.86 (H)  - cont Atripla  Hypokalemia - replacing- recheck tomorrow  Diarrhea - started after being admitted to the hospital - possibly from antibiotics - f/u on C diff and GI pathogen panel    COPD (chronic obstructive pulmonary disease) with emphysema  -has a chronic cough- no exacerbation- follow     Small cell carcinoma of lung  - ongoing outpt treatment    History of pulmonary embolism- 04/2014 - Coumadin being taken at home but INR subtherapeutic- yesterday had dose adjustment from 5 mg on only Monday to 5 mg on Mon and Thursday (which he has not yet taken) and 2.5 on all other days - will start weight based Lovenox until therapeutic- pharmacy managing coumadin    Loss of weight - cont Marinol and supplements    Normocytic anemia - stable    Thrombocytopenia  - acute- due to sepsis? Follow - will be starting Lovenox today  Debbe Odea, MD

## 2016-02-11 NOTE — Progress Notes (Signed)
Pharmacy - Brief Note (warfarin)  Pharmacy dosing warfarin for h/o DVT, INR subtherapeutic and trending down.  Given warfarin booster dose of '5mg'$  at ~03:30 this am (in addition to the 2.'5mg'$  PO he took at home 8/28 pm) to try to get INR trending up.   Plan: - since dose given at 03:30 this am, give warfarin '5mg'$  PO x 1 tonight so not to go extended period of time until next dose and hopefully get INR trending up - Daily INR - watch CBC - Hgb and pltc  Doreene Eland, PharmD, BCPS.   Pager: [phone] 02/11/2016 7:07 PM

## 2016-02-11 NOTE — Progress Notes (Signed)
Pharmacy Antibiotic Note  Jaime Morris is a 55 y.o. male admitted on 02/10/2016 with UTI Pharmacy has been consulted for cefepime dosing.  Plan: Cefepime 1Gm IV q8h   Height: '5\' 9"'$  (175.3 cm) Weight: 134 lb (60.8 kg) IBW/kg (Calculated) : 70.7  Temp (24hrs), Avg:100.3 F (37.9 C), Min:99.2 F (37.3 C), Max:100.9 F (38.3 C)   Recent Labs Lab 02/10/16 2210 02/10/16 2236  WBC 5.1  --   CREATININE 0.68  --   LATICACIDVEN  --  1.34    Estimated Creatinine Clearance: 87.6 mL/min (by C-G formula based on SCr of 0.8 mg/dL).    Allergies  Allergen Reactions  . Lactose Intolerance (Gi) Diarrhea    Antimicrobials this admission: 8/29 cefepime >>  8/29 vancomycin >>x1 ED  Dose adjustments this admission:   Microbiology results:  BCx:   UCx:    Sputum:    MRSA PCR:   Thank you for allowing pharmacy to be a part of this patient's care.  Dorrene German 02/11/2016 12:48 AM

## 2016-02-11 NOTE — Telephone Encounter (Signed)
In hospital on iv antibiotics for " sepsis due to pyelonephritis. Says he will be discharged tomorrow- "they are running more tests". Should he keep his appts on Thursday ? Note to [place].

## 2016-02-11 NOTE — Progress Notes (Signed)
ANTICOAGULATION CONSULT NOTE - Initial Consult  Pharmacy Consult for Warfarin Indication: pulmonary embolus  Allergies  Allergen Reactions  . Lactose Intolerance (Gi) Diarrhea    Patient Measurements: Height: '5\' 9"'$  (175.3 cm) Weight: 134 lb (60.8 kg) IBW/kg (Calculated) : 70.7   Vital Signs: Temp: 99.2 F (37.3 C) (08/29 0020) Temp Source: Oral (08/29 0020) BP: 129/63 (08/29 0133) Pulse Rate: 97 (08/29 0133)  Labs:  Recent Labs  02/10/16 2210  HGB 10.5*  HCT 29.9*  PLT 164  LABPROT 17.6*  INR 1.43  CREATININE 0.68    Estimated Creatinine Clearance: 87.6 mL/min (by C-G formula based on SCr of 0.8 mg/dL).   Medical History: Past Medical History:  Diagnosis Date  . Collapsed lung    d/t biopsy, right  . COPD (chronic obstructive pulmonary disease) ([place])    PFT'S 10/26/13 @ [place]  . Encounter for antineoplastic chemotherapy 10/08/2015  . Gynecomastia, male 10/16/13   MILD RIGHT SIDED PER RADIOLOGY STUDY  . History of depression   . History of pneumonia   . HIV infection ([place])    DR. [name]  . Hyperlipidemia   . Insomnia   . Lung nodule, multiple 10/24/13   CT CHEST W/O...DR. [name]  . Radiation 06/05/14-06/20/14   whole brain 25 gray  . Radiation 11/26/15-12/09/15   sacrum 30 Gy  . Small cell lung cancer ([facility])     Medications:  Scheduled:  . ceFEPime (MAXIPIME) IV  1 g Intravenous Q8H  . dronabinol  10 mg Oral BID AC  . efavirenz-emtricitabine-tenofovir  1 tablet Oral QHS  . HIGH-PROTEIN NUTRITIONAL SHAKE  1 Bottle Oral BID BM  . traZODone  100 mg Oral QHS   Infusions:  . sodium chloride      Assessment: 23 yoM w/ hx of HIV and PE c/o dysuria, hematuria and fever.  Warfarin per Rx.  HD '5mg'$  M/Thur and 2.5 mg on Sun/Tues/Wed/Fri/Sat  LD 2.7 8/28?  Goal of Therapy:  INR 2-3    Plan:   Warfarin '5mg'$  x1 tonight  Daily PT/INR  Lawana Pai R 02/11/2016,2:36 AM

## 2016-02-11 NOTE — Progress Notes (Signed)
PHARMACIST - PHYSICIAN ORDER COMMUNICATION  CONCERNING: P&T Medication Policy on Herbal Medications  DESCRIPTION:  This patient's order for:  melatonin  has been noted.  This product(s) is classified as an "herbal" or natural product. Due to a lack of definitive safety studies or FDA approval, nonstandard manufacturing practices, plus the potential risk of unknown drug-drug interactions while on inpatient medications, the Pharmacy and Therapeutics Committee does not permit the use of "herbal" or natural products of this type within [facility].   ACTION TAKEN: The pharmacy department is unable to verify this order at this time and your patient has been informed of this safety policy. Please reevaluate patient's clinical condition at discharge and address if the herbal or natural product(s) should be resumed at that time.  Thanks Dorrene German 02/11/2016 2:23 AM

## 2016-02-11 NOTE — H&P (Signed)
History and Physical  Patient Name: Jaime Morris     SEG:[id number]    DOB: 05-22-1961    DOA: 02/10/2016 PCP: Irven Shelling, MD   Patient coming from: Home  Chief Complaint: Dysuria and fever  HPI: Jaime Morris is a 55 y.o. 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  The patient was in his normal state of health until the weekend he noticed some pain with urination and gross hematuria. Sunday he had some right flank pain associated with this. Then when he woke up from an afternoon nap at 7 PM tonight he had fever and chills, called his oncologist office, who recommended he be evaluated in the ER. Also has chronic cough, may be worse lately, no sputum production, chest congestion, chest pain, shortness of breath.  ED course: -Febrile to 100.7 F, heart rate 110s, respirations and pulse oximetry normal, mentation near normal -Na 135, K 3.7, Cr 0.7 (baseline), WBC 5.1 K, Hgb 10.5 -Lactic acid 1.3 -Urinalysis showed WBC TNTC -Last HIV RNA elevated in July and T-cell count was 210. -Chest x-ray was equivocal for patchy right opacity  -Blood cultures were obtained, fluids and vancomycin and cefepime were administered for early sepsis from UTI versus HCAP and TRH were asked to evaluate for admission    ROS: Review of Systems  Constitutional: Positive for chills and fever.  Respiratory: Positive for cough (chronic). Negative for hemoptysis, sputum production, shortness of breath and wheezing.   Cardiovascular: Negative for chest pain.  Gastrointestinal: Negative for abdominal pain.  Genitourinary: Positive for dysuria, flank pain and hematuria.  All other systems reviewed and are negative.      Past Medical History:  Diagnosis Date  . Collapsed lung    d/t biopsy, right  . COPD (chronic obstructive pulmonary disease) ([place])    PFT'S 10/26/13 @  [facility]  . Encounter for antineoplastic chemotherapy 10/08/2015  . Gynecomastia, male 10/16/13   MILD RIGHT SIDED PER RADIOLOGY STUDY  . History of depression   . History of pneumonia   . HIV infection ([place])    DR. [name]  . Hyperlipidemia   . Insomnia   . Lung nodule, multiple 10/24/13   CT CHEST W/O...DR. [name]  . Radiation 06/05/14-06/20/14   whole brain 25 gray  . Radiation 11/26/15-12/09/15   sacrum 30 Gy  . Small cell lung cancer [facility])     Past Surgical History:  Procedure Laterality Date  . BACK SURGERY     L 4-5 FUSION WITH SCREW  . COLONOSCOPY  UTD  . FOOT SURGERY     BUNIONECTOMY  . PORTACATH PLACEMENT N/A 12/08/2013   Procedure: INSERTION PORT-A-CATH;  Surgeon: Grace Isaac, MD;  Location[facility] OR;  Service: Thoracic;  Laterality: N/A;    Social History: Patient lives alone with his dog.  The patient walks unasssited.  He is from [place] and plans to move there soon to be near family and get treatment at [facility].  He is an active smoker.  Used to work in Therapist, art.   ECOG 1  Allergies  Allergen Reactions  . Lactose Intolerance (Gi) Diarrhea    Family history: family history includes Heart disease in his father; Melanoma in his paternal grandfather; Sarcoidosis in his sister; Stroke in his mother.  Prior to Admission medications   Medication Sig Start Date End Date Taking? Authorizing Provider  albuterol (PROAIR  HFA) 108 (90 BASE) MCG/ACT inhaler Inhale 2 puffs into the lungs every 4 (four) hours as needed for wheezing or shortness of breath.    Yes Historical Provider, MD  dronabinol (MARINOL) 10 MG capsule Take 10 mg by mouth 2 (two) times daily before a meal.   Yes Historical Provider, MD  efavirenz-emtricitabine-tenofovir (ATRIPLA) 600-200-300 MG tablet Take 1 tablet by mouth at bedtime.   Yes Historical Provider, MD  lidocaine-prilocaine (EMLA) cream Apply 1 application topically as needed (prior to accessing port).   Yes Historical Provider, MD    oxyCODONE-acetaminophen (PERCOCET/ROXICET) 5-325 MG tablet Take 1 tablet by mouth every 6 (six) hours as needed for severe pain. 09/17/15  Yes Curt Bears, MD  prochlorperazine (COMPAZINE) 10 MG tablet Take 1 tablet (10 mg total) by mouth every 6 (six) hours as needed for nausea or vomiting. 12/19/15  Yes Curt Bears, MD  tolterodine (DETROL LA) 4 MG 24 hr capsule Take 4 mg by mouth daily.   Yes Historical Provider, MD  warfarin (COUMADIN) 2.5 MG tablet Take 2.5 mg by mouth See admin instructions. Pt takes this dose on Sunday, Tuesday, Wednesday, Friday, and Saturday.   Yes Historical Provider, MD  warfarin (COUMADIN) 5 MG tablet Take 5 mg by mouth 2 (two) times a week. Pt takes this dose on Monday and Thursday.   Yes Historical Provider, MD  doxylamine, Sleep, (UNISOM) 25 MG tablet Take 50 mg by mouth at bedtime as needed for sleep. Reported on 12/19/2015    Historical Provider, MD  loperamide (IMODIUM) 2 MG capsule Take 2 mg by mouth as needed for diarrhea or loose stools.     Historical Provider, MD  Melatonin 10 MG TABS Take 10 mg by mouth at bedtime as needed (sleep).    Historical Provider, MD  Multiple Vitamin (MULTIVITAMIN) tablet Take 1 tablet by mouth daily.    Historical Provider, MD  Nutritional Supplements (HIGH-PROTEIN NUTRITIONAL SHAKE) LIQD Take 1 each by mouth 2 (two) times daily between meals.    Historical Provider, MD  selenium sulfide (SELSUN) 2.5 % shampoo Reported on 12/19/2015 11/01/15   Historical Provider, MD  traZODone (DESYREL) 100 MG tablet Take 100 mg by mouth at bedtime.    Historical Provider, MD  varenicline (CHANTIX) 1 MG tablet Take 1 mg by mouth 2 (two) times daily.     Historical Provider, MD  vitamin B-12 (CYANOCOBALAMIN) 1000 MCG tablet Take 2,000 mcg by mouth daily.    Historical Provider, MD       Physical Exam: BP 139/88 (BP Location: Right Arm)   Pulse 96   Temp 99.2 F (37.3 C) (Oral)   Resp 12   Ht '5\' 9"'$  (1.753 m)   Wt 60.8 kg (134 lb)   SpO2  100%   BMI 19.79 kg/m  General appearance: Thin adult male, alert and in no acute distress, complains several times of being "out of it".   Eyes: Anicteric, conjunctiva pink, lids and lashes normal.     ENT: No nasal deformity, discharge, or epistaxis.  OP moist without lesions.  Tongue with dark hyperpigmentation in spots. Lymph: No cervical or supraclavicular lymphadenopathy. Skin: Warm and dry.  No jaundice.  No suspicious rashes or lesions. Cardiac: Tachycardic, regular, nl S1-S2, no murmurs appreciated.  Capillary refill is brisk.  JVP normal.  No LE edema.  Radial pulses 2+ and symmetric. Respiratory: Normal respiratory rate and rhythm.  CTAB without rales or wheezes.  Prolonged expiratory phase.  No rales. GI: Abdomen soft without rigidity.  No TTP. No ascites, distension, hepatosplenomegaly.   No CVA/flank tenderness. MSK: No deformities or effusions.  No clubbing/cyanosis. Neuro: Cranial nerves normal.  Sensorium intact and responding to questions, attention appears normal but repeats "I'm out of it, sorry".  Speech is fluent.  Moves all extremities equally and with normal coordination, strength 5/5 and symemtric in upper and lower extremities.    Psych: Affect normal.  Judgment and insight appear normal.       Labs on Admission:  I have personally reviewed following labs and imaging studies: CBC:  Recent Labs Lab 02/10/16 2210  WBC 5.1  NEUTROABS 4.0  HGB 10.5*  HCT 29.9*  MCV 96.8  PLT 761   Basic Metabolic Panel:  Recent Labs Lab 02/10/16 2210  NA 135  K 3.7  CL 104  CO2 25  GLUCOSE 107*  BUN <5*  CREATININE 0.68  CALCIUM 8.7*   GFR: Estimated Creatinine Clearance: 87.6 mL/min (by C-G formula based on SCr of 0.8 mg/dL).  Liver Function Tests:  Recent Labs Lab 02/10/16 2210  AST 19  ALT 14*  ALKPHOS 124  BILITOT 0.5  PROT 6.5  ALBUMIN 3.4*   Coagulation Profile:  Recent Labs Lab 02/10/16 2210  INR 1.43        Radiological Exams on  Admission: Personally reviewed: Dg Chest 2 View  Result Date: 02/10/2016 CLINICAL DATA:  Patient is being treated for small cell lung cancer. Woke up this afternoon with a fever. Emphysema. Smoker. EXAM: CHEST  2 VIEW COMPARISON:  CT chest 12/31/2015.  Chest 11/14/2015. FINDINGS: Central venous catheter with tip over the low SVC region. No pneumothorax. Normal heart size and pulmonary vascularity. Emphysematous changes and central fibrosis in the lungs. Scarring in the right hilum with increasing infiltration around the right hilum since previous study. This may be related to tumor or its treatment or could indicate developing pneumonia. Focal 10 mm nodule in the right mid lung. No blunting of costophrenic angles. No pneumothorax. Mediastinal contours appear intact. Degenerative changes in the spine. IMPRESSION: Developing right perihilar infiltration may indicate pneumonia or may represent posttreatment changes. Focal nodular opacity in the right mid lung measuring 1 cm diameter. Diffuse emphysematous changes and fibrosis in the lungs with chronic bronchitic changes present. Electronically Signed   By: Lucienne Capers M.D.   On: 02/10/2016 23:25        Assessment/Plan 1. Sepsis:  Suspected source UTI. Organism unknown. Patient meets early warning signs criteria given tachycardia, fever, without evidence of end organ dysfunction.  Lactate normal.  Antibiotics delivered in the ED.    -Sepsis bundle utilized:  -Blood and urine cultures drawn  -30 ml/kg bolus given in ED, will repeat lactic acid  -Start targeted antibiotics with cefepime, based on recent hospitalization, immune suppression, cancer  -Repeat renal function and complete blood count in AM   2. HIV:  -Continue Atripla  3. COPD:  -Continue albuterol PRN  4. SCLC metastatic:  Will be moving to [place] soon to be near family and obtain treatment at [facility]. Currently on Irinotecan-Carboplatin.  5. Loss of  weight:  -Continue dronabinol and nutrition supplement  6. Normocytic anemia:  Stable  7. Hx of PE:  -Continue warfarin, dosed per pharmacy  8. Insomnia: -Continue trazodone -Continue melatonin PRN  9. Urinary urge incontinence: -Hold Detrol while being treated for UTI  10. Other medications: -Continue loperamide PRN -Continue Percocet PRN         DVT prophylaxis: On warfarin, not needed  Code Status: FULL  Family Communication: None present  Disposition Plan: Anticipate IV fluids and antibiotics.  Transition to orals when vitals improved and culture data available. Consults called: None Admission status: INPATIENT, med surg    Medical decision making: Patient seen at 1:09 AM on 02/11/2016.  The patient was discussed with Dr. Oleta Mouse. What exists of the patient's chart was reviewed in depth.  Clinical condition: requiring ongoing fluids and IV antibiotics, but mentating well, heart rate improving and respiratory status good.        Edwin Dada [facility] Pager [phone]     At the time of admission, it appears that the appropriate admission status for this patient is INPATIENT. This is judged to be reasonable and necessary in order to provide the required intensity of service to ensure the patient's safety given the presenting symptoms, physical exam findings, and initial radiographic and laboratory data in the context of their chronic comorbidities.  Together, these circumstances are felt to place her/him at high risk for further clinical deterioration threatening life, limb, or organ. The following factors support the admission status of inpatient:   A. The patient's presenting symptoms include dysuria, hematuria, fever. B. The worrisome physical exam findings include tachycardia. C. The initial radiographic and laboratory data are worrisome because of anemia, subtherapeutic INR. D. The chronic co-morbidities include HIV, metastatic cancer, PE on  chronic anticoagulation.

## 2016-02-11 NOTE — Progress Notes (Signed)
Admitted to hospital

## 2016-02-11 NOTE — Progress Notes (Signed)
ANTICOAGULATION CONSULT NOTE - Initial Consult  Pharmacy Consult for Warfarin Indication: pulmonary embolus  Allergies  Allergen Reactions  . Lactose Intolerance (Gi) Diarrhea    Patient Measurements: Height: '5\' 9"'$  (175.3 cm) Weight: 134 lb (60.8 kg) IBW/kg (Calculated) : 70.7   Vital Signs: Temp: 99 F (37.2 C) (08/29 0653) Temp Source: Oral (08/29 0653) BP: 141/87 (08/29 0653) Pulse Rate: 93 (08/29 0315)  Labs:  Recent Labs  02/10/16 2210 02/11/16 0357 02/11/16 0700  HGB 10.5* 10.3*  --   HCT 29.9* 29.7*  --   PLT 164 139*  --   LABPROT 17.6*  --  16.2*  INR 1.43  --  1.29  CREATININE 0.68 0.66  --     Estimated Creatinine Clearance: 87.6 mL/min (by C-G formula based on SCr of 0.8 mg/dL).   Medical History: Past Medical History:  Diagnosis Date  . Collapsed lung    d/t biopsy, right  . COPD (chronic obstructive pulmonary disease) ([place])    PFT'S 10/26/13 @ [place]  . Encounter for antineoplastic chemotherapy 10/08/2015  . Gynecomastia, male 10/16/13   MILD RIGHT SIDED PER RADIOLOGY STUDY  . History of depression   . History of pneumonia   . HIV infection ([place])    DR. [name]  . Hyperlipidemia   . Insomnia   . Lung nodule, multiple 10/24/13   CT CHEST W/O...DR. [name]  . Radiation 06/05/14-06/20/14   whole brain 25 gray  . Radiation 11/26/15-12/09/15   sacrum 30 Gy  . Small cell lung cancer ([facility])     Medications:  Scheduled:  . ceFEPime (MAXIPIME) IV  1 g Intravenous Q8H  . dronabinol  10 mg Oral BID AC  . efavirenz-emtricitabine-tenofovir  1 tablet Oral QHS  . feeding supplement (ENSURE ENLIVE)  1 Bottle Oral BID BM  . fesoterodine  8 mg Oral Daily  . multivitamin  1 tablet Oral QHS  . potassium chloride  40 mEq Oral Q4H  . traZODone  100 mg Oral QHS  . vitamin B-12  2,000 mcg Oral QHS  . Warfarin - Pharmacist Dosing Inpatient   Does not apply q1800   Infusions:     Assessment: 42 yoM w/ hx of HIV and PE c/o dysuria, hematuria and  fever.  Warfarin per Rx.    HD '5mg'$  M/Thur and 2.5 mg on Sun/Tues/Wed/Fri/Sat  LD 2.7 8/28?   Goal of Therapy:  INR 2-3    Plan:   Warfarin '5mg'$  x1   Daily PT/INR  PM update and plan: INR subtherapeutic Pharmacy consulted to dose wt based enoxaparin til INR >2 Enoxaparin '60mg'$  ('1mg'$ /kg) SQ q12h CBC q72h Follow renal function  Dolly Rias RPh 02/11/2016, 1:49 PM Pager [phone]

## 2016-02-12 ENCOUNTER — Telehealth: Payer: Self-pay | Admitting: Medical Oncology

## 2016-02-12 DIAGNOSIS — A419 Sepsis, unspecified organism: Principal | ICD-10-CM

## 2016-02-12 LAB — URINE CULTURE

## 2016-02-12 LAB — BASIC METABOLIC PANEL
ANION GAP: 5 (ref 5–15)
BUN: <5 mg/dL — ABNORMAL LOW (ref 6–20)
CO2: 24 mmol/L (ref 22–32)
CREATININE: 0.69 mg/dL (ref 0.61–1.24)
Calcium: 8.8 mg/dL — ABNORMAL LOW (ref 8.9–10.3)
Chloride: 107 mmol/L (ref 101–111)
GFR calc non Af Amer: >60 mL/min (ref >60)
Glucose, Bld: 93 mg/dL (ref 65–99)
POTASSIUM: 4.3 mmol/L (ref 3.5–5.1)
Sodium: 136 mmol/L (ref 135–145)

## 2016-02-12 LAB — PROTIME-INR
INR: 1.53
Prothrombin Time: 18.6 seconds — ABNORMAL HIGH (ref 11.4–15.2)

## 2016-02-12 MED ORDER — NICOTINE 14 MG/24HR TD PT24
14.0000 mg | MEDICATED_PATCH | Freq: Every day | TRANSDERMAL | Status: DC
  Administered 2016-02-12: 14 mg via TRANSDERMAL
  Filled 2016-02-12: qty 1

## 2016-02-12 MED ORDER — SACCHAROMYCES BOULARDII 250 MG PO CAPS: 250.0000 mg | ORAL_CAPSULE | Freq: Two times a day (BID) | ORAL | 0 refills | Status: AC

## 2016-02-12 MED ORDER — SACCHAROMYCES BOULARDII 250 MG PO CAPS
250.0000 mg | ORAL_CAPSULE | Freq: Two times a day (BID) | ORAL | Status: DC
  Administered 2016-02-12: 250 mg via ORAL
  Filled 2016-02-12: qty 1

## 2016-02-12 MED ORDER — CEPHALEXIN 500 MG PO CAPS: 500.0000 mg | ORAL_CAPSULE | Freq: Three times a day (TID) | ORAL | 0 refills | Status: AC

## 2016-02-12 MED ORDER — WARFARIN SODIUM 5 MG PO TABS: 5.0000 mg | ORAL_TABLET | Freq: Once | ORAL | Status: DC

## 2016-02-12 MED ORDER — HEPARIN SOD (PORK) LOCK FLUSH 100 UNIT/ML IV SOLN
500.0000 [IU] | INTRAVENOUS | Status: DC | PRN
  Filled 2016-02-12: qty 5

## 2016-02-12 MED ORDER — ENOXAPARIN SODIUM 150 MG/ML ~~LOC~~ SOLN: 60.0000 mg | Freq: Two times a day (BID) | SUBCUTANEOUS | 0 refills | Status: AC

## 2016-02-12 MED ORDER — NICOTINE 14 MG/24HR TD PT24: 14.0000 mg | MEDICATED_PATCH | Freq: Every day | TRANSDERMAL | 0 refills | Status: AC

## 2016-02-12 NOTE — Discharge Summary (Signed)
[facility] Discharge Summary   Patient: Jaime Morris:[id number]   PCP: Irven Shelling, MD DOB: [birth year]   Date of admission: 02/10/2016   Date of discharge: 02/12/2016     Discharge Diagnoses:  Principal Problem:   Sepsis ([place]) Active Problems:   Human immunodeficiency virus (HIV) disease ([place])   COPD (chronic obstructive pulmonary disease) with emphysema ([place])   Small cell carcinoma of lung ([place])   History of pulmonary embolism   Loss of weight   Normocytic anemia   Thrombocytopenia (H[facility])   Acute pyelonephritis  Admitted From: [place] Disposition:  home  Recommendations for Outpatient Follow-up:  1. Please follow-up with PCP this Friday for a Coumadin check up and in one week for follow-up from hospitalization.   Follow-up Information    Irven Shelling, MD. Schedule an appointment as soon as possible for a visit in 1 week(s).   Specialty:  Internal Medicine Contact information: [address] [place] [address] [phone]        coumadin clinic. Schedule an appointment as soon as possible for a visit on 02/14/2016.          Diet recommendation: Regular diet  Activity: The patient is advised to gradually reintroduce usual activities.  Discharge Condition: good  Code Status: full code  History of present illness: As per the H and P dictated on admission, "Jaime Morris is a 55 y.o. 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  The patient was in his normal state of health until the weekend he noticed some pain with urination and gross hematuria. Sunday he had some right flank pain associated with this. Then when he woke up from an afternoon nap at 7 PM tonight he had fever and chills, called his oncologist office, who recommended he be evaluated in the ER. Also has chronic cough, may be worse  lately, no sputum production, chest congestion, chest pain, shortness of breath."  Hospital Course:  Brief hospital course: Pt. with PMH of HIV on Atripla, metastatic SCLC on second line chemotherapy, PE on warfarin with subtherapeutic INR; admitted on 02/10/2016, with complaint of dysuria and right flank pain, was found to have pyelonephritis with sepsis. Currently further plan is continue IV antibiotics and monitor culture.  Assessment and Plan: 1. Sepsis ([place]) Due to pyelonephritis. Symptoms are resolved. Temperature is also better. Initially treated with cefepime, blood cultures are so far negative. Urine culture is also negative for any active infection. I will complete a 7 day treatment course for patient. Continue Detrol.  2. HIV. Continue Atripla  3. Diarrhea. GI pathogen panel pending. C. difficile is negative. Add probiotics. Likely from antibiotic.  4. COPD. No evidence of this patient will continue to monitor. Small cell lung cancer. Continue outpatient follow-up.  5. History of PE 11 2015. Due to history of cancer patient started on Lovenox for therapeutic bridging. Continue warfarin per home regimen Patient recommended to get INR checked on Friday.  All other chronic medical condition were stable during the hospitalization.  Patient was ambulatory without any assistance. On the day of the discharge the patient's vitals were stable, and no other acute medical condition were reported by patient. the patient was felt safe to be discharge at home with famiy.  Procedures and Results:  none   Consultations:  none  DISCHARGE MEDICATION: Discharge Medication List as of 02/12/2016  3:17 PM  START taking these medications   Details  cephALEXin (KEFLEX) 500 MG capsule Take 1 capsule (500 mg total) by mouth 3 (three) times daily., Starting Wed 02/12/2016, Until Mon 02/17/2016, Normal    enoxaparin (LOVENOX) 150 MG/ML injection Inject 0.4 mLs (60 mg total) into the  skin every 12 (twelve) hours., Starting Wed 02/12/2016, Normal    nicotine (NICODERM CQ - DOSED IN MG/24 HOURS) 14 mg/24hr patch Place 1 patch (14 mg total) onto the skin daily., Starting Wed 02/12/2016, Normal    saccharomyces boulardii (FLORASTOR) 250 MG capsule Take 1 capsule (250 mg total) by mouth 2 (two) times daily., Starting Wed 02/12/2016, Normal      CONTINUE these medications which have NOT CHANGED   Details  albuterol (PROAIR HFA) 108 (90 BASE) MCG/ACT inhaler Inhale 2 puffs into the lungs every 4 (four) hours as needed for wheezing or shortness of breath. , Historical Med    doxylamine, Sleep, (UNISOM) 25 MG tablet Take 25 mg by mouth at bedtime as needed for sleep. , Historical Med    dronabinol (MARINOL) 10 MG capsule Take 10 mg by mouth 2 (two) times daily before a meal., Historical Med    efavirenz-emtricitabine-tenofovir (ATRIPLA) 600-200-300 MG tablet Take 1 tablet by mouth at bedtime., Historical Med    lidocaine-prilocaine (EMLA) cream Apply 1 application topically as needed (prior to accessing port)., Historical Med    loperamide (IMODIUM) 2 MG capsule Take 2 mg by mouth as needed for diarrhea or loose stools. , Historical Med    Melatonin 10 MG TABS Take 10 mg by mouth at bedtime as needed (for sleep). , Historical Med    Multiple Vitamin (MULTIVITAMIN WITH MINERALS) TABS tablet Take 1 tablet by mouth at bedtime., Historical Med    Nutritional Supplements (HIGH-PROTEIN NUTRITIONAL SHAKE) LIQD Take 1 Bottle by mouth 2 (two) times daily between meals. , Historical Med    oxyCODONE-acetaminophen (PERCOCET/ROXICET) 5-325 MG tablet Take 1 tablet by mouth every 6 (six) hours as needed for severe pain., Starting 09/17/2015, Until Discontinued, Print    prochlorperazine (COMPAZINE) 10 MG tablet Take 1 tablet (10 mg total) by mouth every 6 (six) hours as needed for nausea or vomiting., Starting Thu 12/19/2015, Normal    tolterodine (DETROL LA) 4 MG 24 hr capsule Take 4 mg by  mouth at bedtime. , Historical Med    traZODone (DESYREL) 100 MG tablet Take 100 mg by mouth at bedtime., Until Discontinued, Historical Med    vitamin B-12 (CYANOCOBALAMIN) 1000 MCG tablet Take 2,000 mcg by mouth at bedtime. , Historical Med    !! warfarin (COUMADIN) 2.5 MG tablet Take 2.5 mg by mouth See admin instructions. Pt takes this dose on Sunday, Tuesday, Wednesday, Friday, and Saturday., Historical Med    !! warfarin (COUMADIN) 5 MG tablet Take 5 mg by mouth 2 (two) times a week. Pt takes this dose on Monday and Thursday., Historical Med     !! - Potential duplicate medications found. Please discuss with provider.     Allergies  Allergen Reactions  . Lactose Intolerance (Gi) Diarrhea   Discharge Instructions    Diet - low sodium heart healthy    Complete by:  As directed   Discharge instructions    Complete by:  As directed   It is important that you read following instructions as well as go over your medication list with RN to help you understand your care after this hospitalization.  Discharge Instructions: Please follow-up with PCP in one week  Please  request your primary care physician to go over all Hospital Tests and Procedure/Radiological results at the follow up,  Please get all Hospital records sent to your PCP by signing hospital release before you go home.   Do not take more than prescribed Pain, Sleep and Anxiety Medications. You were cared for by a hospitalist during your hospital stay. If you have any questions about your discharge medications or the care you received while you were in the hospital after you are discharged, you can call the unit and ask to speak with the hospitalist on call if the hospitalist that took care of you is not available.  Once you are discharged, your primary care physician will handle any further medical issues. Please note that NO REFILLS for any discharge medications will be authorized once you are discharged, as it is imperative  that you return to your primary care physician (or establish a relationship with a primary care physician if you do not have one) for your aftercare needs so that they can reassess your need for medications and monitor your lab values. You Must read complete instructions/literature along with all the possible adverse reactions/side effects for all the Medicines you take and that have been prescribed to you. Take any new Medicines after you have completely understood and accept all the possible adverse reactions/side effects. Wear Seat belts while driving. If you have smoked or chewed Tobacco in the last 2 yrs please stop smoking and/or stop any Recreational drug use.   Increase activity slowly    Complete by:  As directed     Discharge Exam: Filed Weights   02/10/16 2128 02/10/16 2138  Weight: 60.8 kg (134 lb) 60.8 kg (134 lb)   Vitals:   02/12/16 0433 02/12/16 1425  BP: (!) 135/91 (!) 126/98  Pulse: 100 (!) 102  Resp: 18 16  Temp: 98.7 F (37.1 C) 98 F (36.7 C)   General: Appear in no distress, no Rash; Oral Mucosa moist Cardiovascular: S1 and S2 Present, no Murmur, no JVD Respiratory: Bilateral Air entry present and Clear to Auscultation, no Crackles, no wheezes Abdomen: Bowel Sound present, Soft and no tenderness Extremities: no Pedal edema, no calf tenderness Neurology: Grossly no focal neuro deficit.  The results of significant diagnostics from this hospitalization (including imaging, microbiology, ancillary and laboratory) are listed below for reference.    Significant Diagnostic Studies: Dg Chest 2 View  Result Date: 02/10/2016 CLINICAL DATA:  Patient is being treated for small cell lung cancer. Woke up this afternoon with a fever. Emphysema. Smoker. EXAM: CHEST  2 VIEW COMPARISON:  CT chest 12/31/2015.  Chest 11/14/2015. FINDINGS: Central venous catheter with tip over the low SVC region. No pneumothorax. Normal heart size and pulmonary vascularity. Emphysematous changes and  central fibrosis in the lungs. Scarring in the right hilum with increasing infiltration around the right hilum since previous study. This may be related to tumor or its treatment or could indicate developing pneumonia. Focal 10 mm nodule in the right mid lung. No blunting of costophrenic angles. No pneumothorax. Mediastinal contours appear intact. Degenerative changes in the spine. IMPRESSION: Developing right perihilar infiltration may indicate pneumonia or may represent posttreatment changes. Focal nodular opacity in the right mid lung measuring 1 cm diameter. Diffuse emphysematous changes and fibrosis in the lungs with chronic bronchitic changes present. Electronically Signed   By: Lucienne Capers M.D.   On: 02/10/2016 23:25    Microbiology: Recent Results (from the past 240 hour(s))  Urine culture  Status: Abnormal   Collection Time: 02/10/16 11:30 PM  Result Value Ref Range Status   Specimen Description URINE, RANDOM  Final   Special Requests NONE  Final   Culture (A)  Final    <10,000 COLONIES/mL INSIGNIFICANT GROWTH Performed at [facility]    Report Status 02/12/2016 FINAL  Final  C difficile quick scan w PCR reflex     Status: None   Collection Time: 02/11/16  1:30 PM  Result Value Ref Range Status   C Diff antigen NEGATIVE NEGATIVE Final   C Diff toxin NEGATIVE NEGATIVE Final   C Diff interpretation No C. difficile detected.  Final     Labs: CBC:  Recent Labs Lab 02/10/16 2210 02/11/16 0357  WBC 5.1 4.3  NEUTROABS 4.0  --   HGB 10.5* 10.3*  HCT 29.9* 29.7*  MCV 96.8 98.7  PLT 164 320*   Basic Metabolic Panel:  Recent Labs Lab 02/10/16 2210 02/11/16 0357 02/12/16 0610  NA 135 136 136  K 3.7 3.3* 4.3  CL 104 108 107  CO2 '25 23 24  '$ GLUCOSE 107* 124* 93  BUN <5* <5* <5*  CREATININE 0.68 0.66 0.69  CALCIUM 8.7* 8.4* 8.8*   Liver Function Tests:  Recent Labs Lab 02/10/16 2210  AST 19  ALT 14*  ALKPHOS 124  BILITOT 0.5  PROT 6.5  ALBUMIN  3.4*   Time spent: 30 minutes  Signed:  PATEL, PRANAV  [facility] 02/12/2016  , 7:22 PM

## 2016-02-12 NOTE — Telephone Encounter (Signed)
I told pt to keep his appt with [facility] tomorrow if he is out of hospital.

## 2016-02-12 NOTE — Telephone Encounter (Signed)
Pt confirmed appts.

## 2016-02-12 NOTE — Progress Notes (Signed)
Pt discharged home in stable condition.  Discharge instructions given. Scripts sent to pharmacy of choice.  Pt verbalized understanding. No immediate questions or concerns at this time.

## 2016-02-12 NOTE — Progress Notes (Signed)
ANTICOAGULATION CONSULT NOTE -   Pharmacy Consult for Warfarin with enoxaparin bridge Indication: pulmonary embolus  Allergies  Allergen Reactions  . Lactose Intolerance (Gi) Diarrhea    Patient Measurements: Height: '5\' 9"'$  (175.3 cm) Weight: 134 lb (60.8 kg) IBW/kg (Calculated) : 70.7   Vital Signs: Temp: 98.7 F (37.1 C) (08/30 0433) Temp Source: Oral (08/30 0433) BP: 135/91 (08/30 0433) Pulse Rate: 100 (08/30 0433)  Labs:  Recent Labs  02/10/16 2210 02/11/16 0357 02/11/16 0700 02/12/16 0610  HGB 10.5* 10.3*  --   --   HCT 29.9* 29.7*  --   --   PLT 164 139*  --   --   LABPROT 17.6*  --  16.2* 18.6*  INR 1.43  --  1.29 1.53  CREATININE 0.68 0.66  --  0.69    Estimated Creatinine Clearance: 87.6 mL/min (by C-G formula based on SCr of 0.8 mg/dL).   Medical History: Past Medical History:  Diagnosis Date  . Collapsed lung    d/t biopsy, right  . COPD (chronic obstructive pulmonary disease) ([place])    PFT'S 10/26/13 @ [facility]  . Encounter for antineoplastic chemotherapy 10/08/2015  . Gynecomastia, male 10/16/13   MILD RIGHT SIDED PER RADIOLOGY STUDY  . History of depression   . History of pneumonia   . HIV infection ([place])    DR. [name]  . Hyperlipidemia   . Insomnia   . Lung nodule, multiple 10/24/13   CT CHEST W/O...DR. [name]  . Radiation 06/05/14-06/20/14   whole brain 25 gray  . Radiation 11/26/15-12/09/15   sacrum 30 Gy  . Small cell lung cancer (H[facility])     Medications:  Scheduled:  . ceFEPime (MAXIPIME) IV  1 g Intravenous Q8H  . dronabinol  10 mg Oral BID AC  . efavirenz-emtricitabine-tenofovir  1 tablet Oral QHS  . enoxaparin (LOVENOX) injection  1 mg/kg Subcutaneous Q12H  . feeding supplement (ENSURE ENLIVE)  1 Bottle Oral BID BM  . fesoterodine  8 mg Oral Daily  . multivitamin  1 tablet Oral QHS  . saccharomyces boulardii  250 mg Oral BID  . traZODone  100 mg Oral QHS  . vitamin B-12  2,000 mcg Oral QHS  . Warfarin - Pharmacist Dosing  Inpatient   Does not apply q1800   Infusions:     Assessment: 18 yoM w/ hx of HIV and PE c/o dysuria, hematuria and fever.  Warfarin with enoxaparin bridge per Rx.   Home dose: '5mg'$  M/Thur and 2.5 mg on Sun/Tues/Wed/Fri/Sat  LD 2.7 8/28?   02/12/2016 INR 1.53 subtherapeutic but rising H/H low but stable Scr WNL Consuming ~ 75% of meals No DDI noted  Goal of Therapy:  INR 2-3    Plan:   Warfarin '5mg'$  x1 tonight  Continue Enoxaparin '60mg'$  ('1mg'$ /kg) SQ q12h til INR >2  Daily PT/INR  CBC q72h   Dolly Rias RPh 02/12/2016, 8:36 AM Pager [phone]

## 2016-02-13 ENCOUNTER — Other Ambulatory Visit: Payer: BLUE CROSS/BLUE SHIELD

## 2016-02-13 ENCOUNTER — Encounter: Payer: Self-pay | Admitting: Internal Medicine

## 2016-02-13 ENCOUNTER — Ambulatory Visit: Payer: BLUE CROSS/BLUE SHIELD

## 2016-02-13 ENCOUNTER — Ambulatory Visit (HOSPITAL_BASED_OUTPATIENT_CLINIC_OR_DEPARTMENT_OTHER): Payer: BLUE CROSS/BLUE SHIELD | Admitting: Internal Medicine

## 2016-02-13 ENCOUNTER — Telehealth: Payer: Self-pay | Admitting: Internal Medicine

## 2016-02-13 VITALS — BP 128/81 | Temp 98.8°F | Resp 18 | Ht 69.0 in | Wt 135.0 lb

## 2016-02-13 DIAGNOSIS — Z5111 Encounter for antineoplastic chemotherapy: Secondary | ICD-10-CM

## 2016-02-13 DIAGNOSIS — C3492 Malignant neoplasm of unspecified part of left bronchus or lung: Secondary | ICD-10-CM | POA: Diagnosis not present

## 2016-02-13 DIAGNOSIS — C787 Secondary malignant neoplasm of liver and intrahepatic bile duct: Secondary | ICD-10-CM

## 2016-02-13 DIAGNOSIS — C3491 Malignant neoplasm of unspecified part of right bronchus or lung: Secondary | ICD-10-CM

## 2016-02-13 DIAGNOSIS — I2699 Other pulmonary embolism without acute cor pulmonale: Secondary | ICD-10-CM | POA: Diagnosis not present

## 2016-02-13 DIAGNOSIS — B20 Human immunodeficiency virus [HIV] disease: Secondary | ICD-10-CM

## 2016-02-13 DIAGNOSIS — R634 Abnormal weight loss: Secondary | ICD-10-CM | POA: Diagnosis not present

## 2016-02-13 DIAGNOSIS — C7951 Secondary malignant neoplasm of bone: Secondary | ICD-10-CM

## 2016-02-13 NOTE — Telephone Encounter (Signed)
GAVE PATIENT AVS REPORT AND APPOINTMENTS FOR September AND October  °

## 2016-02-13 NOTE — Progress Notes (Signed)
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 2) incidental diagnosis of pulmonary embolism on CT scan of the chest performed on 04/18/2014. Primary site: Lung (Right)  Staging method: AJCC 7th Edition  Clinical: Stage IV (T1a, N1, M1b) signed by Curt Bears, MD on 12/07/2013 3:19 PM  Summary: Stage IV (T1a, N1, M1b)   PRIOR THERAPY:  1)  Systemic chemotherapy with carboplatin for AUC of 5 given on day 1, etoposide 100 mg/m2 given on days 1, 2 and 3 with neulast support on day 4. Status post 6 cycles.  2) Lovenox 120 mg subcutaneously started 04/18/2014. 3) status post whole brain irradiation under the care of Dr. Sondra Come completed 06/21/2014. 4) systemic chemotherapy again with carboplatin for AUC of 5 on day 1 and etoposide 100 MG/M2 on days 1, 2 and 3 with Neulasta support on day 4. First cycle expected on 09/17/2015. Status post 2 cycles discontinued secondary to disease progression.  CURRENT THERAPY:  1) systemic chemotherapy with cisplatin 30 MG/M2 and irinotecan 65 MG/M2 on days 1 and 8 every 3 weeks. First dose 11/07/2015. Status post 4 cycles. 2) initial treatment with Xarelto 20 mg by mouth daily. First dose of treatment on 05/03/2014. This was switched on 10/01/2014 to Coumadin and followed by primary care physician.  DISEASE STAGE:  Small cell carcinoma of lung, Extensive stage  Primary site: Lung (Right)  Staging method: AJCC 7th Edition  Clinical: Stage IV (T1a, N1, M1b) signed by Curt Bears, MD on 12/07/2013 3:19 PM  Summary: Stage IV (T1a, N1, M1b)  CHEMOTHERAPY INTENT: pallative  CURRENT # OF CHEMOTHERAPY CYCLES: 5 CURRENT ANTIEMETICS: Zofran, dexamethasone and compazine  CURRENT SMOKING STATUS: Former smoker, quit 12/18/2013  ORAL CHEMOTHERAPY AND  CONSENT: n/a  CURRENT BISPHOSPHONATES USE: none  PAIN MANAGEMENT: Lortab 5/325 mg  NARCOTICS INDUCED CONSTIPATION: none  LIVING WILL AND CODE STATUS: ?   INTERVAL HISTORY: HIKEEM ANDERSSON 55 y.o. male returns to the clinic today for followup visit. He is currently on systemic chemotherapy with reduced dose cisplatin and irinotecan on days 1 and 8 every 3 weeks. He is status post 4 cycles and tolerating his treatment well except for few episodes of diarrhea. He was recently admitted to [facility] with urinary tract infection. He is feeling better except for the fatigue. He was seen by Dr. Marjie Skiff at [facility] and expected to resume the care with him after he moves to [facility]. This is expected to happen later next month.  He denied having any significant chest pain, shortness of breath, cough or hemoptysis. He has no fever or chills. He has no nausea or vomiting. She was supposed to start cycle #5 of his chemotherapy today.  MEDICAL HISTORY: Past Medical History:  Diagnosis Date  . Collapsed lung    d/t biopsy, right  . COPD (chronic obstructive pulmonary disease) ([place])    PFT'S 10/26/13 @ [facility]  . Encounter for antineoplastic chemotherapy 10/08/2015  . Gynecomastia, male 10/16/13   MILD RIGHT SIDED PER RADIOLOGY STUDY  . History of depression   . History of pneumonia   . HIV infection ([place])    DR. [name]  . Hyperlipidemia   . Insomnia   . Lung nodule, multiple 10/24/13   CT CHEST W/O...DR. [name]  .  Radiation 06/05/14-06/20/14   whole brain 25 gray  . Radiation 11/26/15-12/09/15   sacrum 30 Gy  . Small cell lung cancer ([facility])     ALLERGIES:  is allergic to lactose intolerance (gi).  MEDICATIONS:  Current Outpatient Prescriptions  Medication Sig Dispense Refill  . albuterol (PROAIR HFA) 108 (90 BASE) MCG/ACT inhaler Inhale 2 puffs into the lungs every 4 (four) hours as needed for wheezing or shortness of breath.     . cephALEXin (KEFLEX) 500 MG  capsule Take 1 capsule (500 mg total) by mouth 3 (three) times daily. 15 capsule 0  . doxylamine, Sleep, (UNISOM) 25 MG tablet Take 25 mg by mouth at bedtime as needed for sleep.     Marland Kitchen dronabinol (MARINOL) 10 MG capsule Take 10 mg by mouth 2 (two) times daily before a meal.    . efavirenz-emtricitabine-tenofovir (ATRIPLA) 600-200-300 MG tablet Take 1 tablet by mouth at bedtime.    . enoxaparin (LOVENOX) 150 MG/ML injection Inject 0.4 mLs (60 mg total) into the skin every 12 (twelve) hours. 6 Syringe 0  . lidocaine-prilocaine (EMLA) cream Apply 1 application topically as needed (prior to accessing port).    Marland Kitchen loperamide (IMODIUM) 2 MG capsule Take 2 mg by mouth as needed for diarrhea or loose stools.     . Melatonin 10 MG TABS Take 10 mg by mouth at bedtime as needed (for sleep).     . Multiple Vitamin (MULTIVITAMIN WITH MINERALS) TABS tablet Take 1 tablet by mouth at bedtime.    . nicotine (NICODERM CQ - DOSED IN MG/24 HOURS) 14 mg/24hr patch Place 1 patch (14 mg total) onto the skin daily. 28 patch 0  . Nutritional Supplements (HIGH-PROTEIN NUTRITIONAL SHAKE) LIQD Take 1 Bottle by mouth 2 (two) times daily between meals.     Marland Kitchen oxyCODONE-acetaminophen (PERCOCET/ROXICET) 5-325 MG tablet Take 1 tablet by mouth every 6 (six) hours as needed for severe pain. 40 tablet 0  . prochlorperazine (COMPAZINE) 10 MG tablet Take 1 tablet (10 mg total) by mouth every 6 (six) hours as needed for nausea or vomiting. 30 tablet 1  . saccharomyces boulardii (FLORASTOR) 250 MG capsule Take 1 capsule (250 mg total) by mouth 2 (two) times daily. 14 capsule 0  . tolterodine (DETROL LA) 4 MG 24 hr capsule Take 4 mg by mouth at bedtime.     . traZODone (DESYREL) 100 MG tablet Take 100 mg by mouth at bedtime.    . vitamin B-12 (CYANOCOBALAMIN) 1000 MCG tablet Take 2,000 mcg by mouth at bedtime.     Marland Kitchen warfarin (COUMADIN) 2.5 MG tablet Take 2.5 mg by mouth See admin instructions. Pt takes this dose on Sunday, Tuesday,  Wednesday, Friday, and Saturday.    . warfarin (COUMADIN) 5 MG tablet Take 5 mg by mouth 2 (two) times a week. Pt takes this dose on Monday and Thursday.     No current facility-administered medications for this visit.    Facility-Administered Medications Ordered in Other Visits  Medication Dose Route Frequency Provider Last Rate Last Dose  . sodium chloride 0.9 % injection 10 mL  10 mL Intracatheter PRN Curt Bears, MD   10 mL at 01/30/14 1451    SURGICAL HISTORY:  Past Surgical History:  Procedure Laterality Date  . BACK SURGERY     L 4-5 FUSION WITH SCREW  . COLONOSCOPY  UTD  . FOOT SURGERY     BUNIONECTOMY  . PORTACATH PLACEMENT N/A 12/08/2013   Procedure: INSERTION PORT-A-CATH;  Surgeon: Lilia Argue  [name], MD;  Location: [place];  Service: Thoracic;  Laterality: N/A;    REVIEW OF SYSTEMS:  A comprehensive review of systems was negative except for: Constitutional: positive for fatigue Genitourinary: positive for frequency   PHYSICAL EXAMINATION: General appearance: alert, cooperative, fatigued and no distress Head: Normocephalic, without obvious abnormality, atraumatic Neck: no adenopathy, no JVD, supple, symmetrical, trachea midline and thyroid not enlarged, symmetric, no tenderness/mass/nodules Lymph nodes: Cervical, supraclavicular, and axillary nodes normal. Resp: clear to auscultation bilaterally Back: symmetric, no curvature. ROM normal. No CVA tenderness. Cardio: regular rate and rhythm, S1, S2 normal, no murmur, click, rub or gallop GI: soft, non-tender; bowel sounds normal; no masses,  no organomegaly Extremities: extremities normal, atraumatic, no cyanosis or edema Neurologic: Alert and oriented X 3, normal strength and tone. Normal symmetric reflexes. Normal coordination and gait  ECOG PERFORMANCE STATUS: 1 - Symptomatic but completely ambulatory  Blood pressure 128/81, temperature 98.8 F (37.1 C), temperature source Oral, resp. rate 18, height '5\' 9"'$  (1.753 m),  weight 135 lb (61.2 kg), SpO2 99 %.  LABORATORY DATA: Lab Results  Component Value Date   WBC 4.3 02/11/2016   HGB 10.3 (L) 02/11/2016   HCT 29.7 (L) 02/11/2016   MCV 98.7 02/11/2016   PLT 139 (L) 02/11/2016      Chemistry      Component Value Date/Time   NA 136 02/12/2016 0610   NA 137 01/30/2016 0751   K 4.3 02/12/2016 0610   K 4.0 01/30/2016 0751   CL 107 02/12/2016 0610   CO2 24 02/12/2016 0610   CO2 24 01/30/2016 0751   BUN <5 (L) 02/12/2016 0610   BUN 6.1 (L) 01/30/2016 0751   CREATININE 0.69 02/12/2016 0610   CREATININE 0.8 01/30/2016 0751      Component Value Date/Time   CALCIUM 8.8 (L) 02/12/2016 0610   CALCIUM 9.3 01/30/2016 0751   ALKPHOS 124 02/10/2016 2210   ALKPHOS 126 01/30/2016 0751   AST 19 02/10/2016 2210   AST 25 01/30/2016 0751   ALT 14 (L) 02/10/2016 2210   ALT 17 01/30/2016 0751   BILITOT 0.5 02/10/2016 2210   BILITOT 0.31 01/30/2016 0751       RADIOGRAPHIC STUDIES: Dg Chest 2 View  Result Date: 02/10/2016 CLINICAL DATA:  Patient is being treated for small cell lung cancer. Woke up this afternoon with a fever. Emphysema. Smoker. EXAM: CHEST  2 VIEW COMPARISON:  CT chest 12/31/2015.  Chest 11/14/2015. FINDINGS: Central venous catheter with tip over the low SVC region. No pneumothorax. Normal heart size and pulmonary vascularity. Emphysematous changes and central fibrosis in the lungs. Scarring in the right hilum with increasing infiltration around the right hilum since previous study. This may be related to tumor or its treatment or could indicate developing pneumonia. Focal 10 mm nodule in the right mid lung. No blunting of costophrenic angles. No pneumothorax. Mediastinal contours appear intact. Degenerative changes in the spine. IMPRESSION: Developing right perihilar infiltration may indicate pneumonia or may represent posttreatment changes. Focal nodular opacity in the right mid lung measuring 1 cm diameter. Diffuse emphysematous changes and  fibrosis in the lungs with chronic bronchitic changes present. Electronically Signed   By: Lucienne Capers M.D.   On: 02/10/2016 23:25   ASSESSMENT AND PLAN:  This is a very pleasant 55 years old [place] male recently diagnosed with:  1) Extensive stage small cell lung cancer: He is status post 6 cycles of systemic chemotherapy with carboplatin and etoposide. This was followed by prophylactic  cranial irradiation.  He has been on observation for the last 18 months.  Unfortunately the recent CT scan of the chest, abdomen and pelvis showed evidence for disease progression including right paratracheal lymphadenopathy in addition to bilateral adrenal gland masses and new small liver lesion as well as bone lesion. He was started on second course of treatment with chemotherapy was carboplatin and etoposide status post 2 cycle. He tolerated his treatment well with no significant adverse effects except for fatigue. Unfortunately the recent CT scan of the chest, abdomen and pelvis showed evidence for further disease progression in the liver and bone. His treatment with carboplatin and etoposide was discontinued. The patient was started on second line treatment with reduced dose cisplatin and irinotecan on days 1 and 8 every 3 weeks. Status post 4 cycles. He tolerated the previous cycles of the chemotherapy well except for fatigue and occasional episodes of diarrhea. I recommended for the patient to continue with his chemotherapy but we will start cycle #5 next week after improvement of his condition. He is expected to move to [facility] on 03/12/2016. He would like to resume his treatment there after moving.  2) right lower lobe pulmonary embolus: He will continue his Coumadin treatment under the care of his primary care physician.  3) For the weight loss, I recommended for the patient to continue Marinol.   He was advised to call immediately if he has any concerning symptoms in the  interval. The patient voices understanding of current disease status and treatment options and is in agreement with the current care plan.  All questions were answered. The patient knows to call the clinic with any problems, questions or concerns. We can certainly see the patient much sooner if necessary.  Disclaimer: This note was dictated with voice recognition software. Similar sounding words can inadvertently be transcribed and may not be corrected upon review.

## 2016-02-14 ENCOUNTER — Other Ambulatory Visit: Payer: BLUE CROSS/BLUE SHIELD

## 2016-02-16 LAB — CULTURE, BLOOD (ROUTINE X 2)
CULTURE: NO GROWTH
Culture: NO GROWTH

## 2016-02-17 ENCOUNTER — Other Ambulatory Visit: Payer: Self-pay | Admitting: Internal Medicine

## 2016-02-20 ENCOUNTER — Ambulatory Visit (HOSPITAL_BASED_OUTPATIENT_CLINIC_OR_DEPARTMENT_OTHER): Payer: BLUE CROSS/BLUE SHIELD

## 2016-02-20 ENCOUNTER — Ambulatory Visit: Payer: BLUE CROSS/BLUE SHIELD

## 2016-02-20 ENCOUNTER — Other Ambulatory Visit: Payer: Self-pay | Admitting: Medical Oncology

## 2016-02-20 ENCOUNTER — Other Ambulatory Visit (HOSPITAL_BASED_OUTPATIENT_CLINIC_OR_DEPARTMENT_OTHER): Payer: BLUE CROSS/BLUE SHIELD

## 2016-02-20 VITALS — BP 130/84 | HR 94 | Temp 98.2°F | Resp 16

## 2016-02-20 DIAGNOSIS — C3492 Malignant neoplasm of unspecified part of left bronchus or lung: Secondary | ICD-10-CM | POA: Diagnosis not present

## 2016-02-20 DIAGNOSIS — C3490 Malignant neoplasm of unspecified part of unspecified bronchus or lung: Secondary | ICD-10-CM

## 2016-02-20 DIAGNOSIS — Z5111 Encounter for antineoplastic chemotherapy: Secondary | ICD-10-CM | POA: Diagnosis not present

## 2016-02-20 LAB — COMPREHENSIVE METABOLIC PANEL
ALBUMIN: 3 g/dL — AB (ref 3.5–5.0)
ALK PHOS: 148 U/L (ref 40–150)
ALT: 22 U/L (ref 0–55)
AST: 69 U/L — AB (ref 5–34)
Anion Gap: 9 mEq/L (ref 3–11)
BILIRUBIN TOTAL: 0.32 mg/dL (ref 0.20–1.20)
CALCIUM: 9 mg/dL (ref 8.4–10.4)
CO2: 23 mEq/L (ref 22–29)
CREATININE: 0.7 mg/dL (ref 0.7–1.3)
Chloride: 105 mEq/L (ref 98–109)
EGFR: >90 mL/min/{1.73_m2} (ref >90)
Glucose: 87 mg/dl (ref 70–140)
POTASSIUM: 4 meq/L (ref 3.5–5.1)
Sodium: 137 mEq/L (ref 136–145)
Total Protein: 6.7 g/dL (ref 6.4–8.3)

## 2016-02-20 LAB — CBC WITH DIFFERENTIAL/PLATELET
BASO%: 0.4 % (ref 0.0–2.0)
Basophils Absolute: 0 10*3/uL (ref 0.0–0.1)
EOS ABS: 0.1 10*3/uL (ref 0.0–0.5)
EOS%: 1.7 % (ref 0.0–7.0)
HCT: 31.1 % — ABNORMAL LOW (ref 38.4–49.9)
HEMOGLOBIN: 10.7 g/dL — AB (ref 13.0–17.1)
LYMPH#: 0.7 10*3/uL — AB (ref 0.9–3.3)
LYMPH%: 22.7 % (ref 14.0–49.0)
MCH: 35.4 pg — ABNORMAL HIGH (ref 27.2–33.4)
MCHC: 34.5 g/dL (ref 32.0–36.0)
MCV: 102.6 fL — AB (ref 79.3–98.0)
MONO#: 0.3 10*3/uL (ref 0.1–0.9)
MONO%: 9.4 % (ref 0.0–14.0)
NEUT%: 65.8 % (ref 39.0–75.0)
NEUTROS ABS: 2.1 10*3/uL (ref 1.5–6.5)
PLATELETS: 176 10*3/uL (ref 140–400)
RBC: 3.03 10*6/uL — ABNORMAL LOW (ref 4.20–5.82)
RDW: 19.7 % — AB (ref 11.0–14.6)
WBC: 3.2 10*3/uL — AB (ref 4.0–10.3)

## 2016-02-20 MED ORDER — PALONOSETRON HCL INJECTION 0.25 MG/5ML
0.2500 mg | Freq: Once | INTRAVENOUS | Status: AC
  Administered 2016-02-20: 0.25 mg via INTRAVENOUS

## 2016-02-20 MED ORDER — SODIUM CHLORIDE 0.9 % IV SOLN
Freq: Once | INTRAVENOUS | Status: AC
  Administered 2016-02-20: 12:00:00 via INTRAVENOUS
  Filled 2016-02-20: qty 5

## 2016-02-20 MED ORDER — SODIUM CHLORIDE 0.9 % IV SOLN
Freq: Once | INTRAVENOUS | Status: AC
  Administered 2016-02-20: 13:00:00 via INTRAVENOUS

## 2016-02-20 MED ORDER — DEXTROSE 5 % IV SOLN
65.0000 mg/m2 | Freq: Once | INTRAVENOUS | Status: AC
  Administered 2016-02-20: 120 mg via INTRAVENOUS
  Filled 2016-02-20: qty 6

## 2016-02-20 MED ORDER — SODIUM CHLORIDE 0.9 % IV SOLN
30.0000 mg/m2 | Freq: Once | INTRAVENOUS | Status: AC
  Administered 2016-02-20: 55 mg via INTRAVENOUS
  Filled 2016-02-20: qty 55

## 2016-02-20 MED ORDER — ATROPINE SULFATE 1 MG/ML IJ SOLN
INTRAMUSCULAR | Status: AC
  Filled 2016-02-20: qty 1

## 2016-02-20 MED ORDER — HEPARIN SOD (PORK) LOCK FLUSH 100 UNIT/ML IV SOLN
500.0000 [IU] | Freq: Once | INTRAVENOUS | Status: AC | PRN
  Administered 2016-02-20: 500 [IU]
  Filled 2016-02-20: qty 5

## 2016-02-20 MED ORDER — SODIUM CHLORIDE 0.9% FLUSH
10.0000 mL | INTRAVENOUS | Status: DC | PRN
  Administered 2016-02-20: 10 mL
  Filled 2016-02-20: qty 10

## 2016-02-20 MED ORDER — PALONOSETRON HCL INJECTION 0.25 MG/5ML
INTRAVENOUS | Status: AC
  Filled 2016-02-20: qty 5

## 2016-02-20 MED ORDER — LORAZEPAM 0.5 MG PO TABS: 0.5000 mg | ORAL_TABLET | Freq: Three times a day (TID) | ORAL | 0 refills | Status: AC

## 2016-02-20 MED ORDER — POTASSIUM CHLORIDE 2 MEQ/ML IV SOLN
Freq: Once | INTRAVENOUS | Status: AC
  Administered 2016-02-20: 10:00:00 via INTRAVENOUS
  Filled 2016-02-20: qty 10

## 2016-02-20 MED ORDER — ATROPINE SULFATE 1 MG/ML IJ SOLN
0.5000 mg | Freq: Once | INTRAMUSCULAR | Status: AC | PRN
  Administered 2016-02-20: 0.5 mg via INTRAVENOUS

## 2016-02-20 NOTE — Patient Instructions (Signed)
[place] [facility] Discharge Instructions for Patients Receiving Chemotherapy  Today you received the following chemotherapy agents Irinotecan and Cisplatin  To help prevent nausea and vomiting after your treatment, we encourage you to take your nausea medication as directed. No Zofran for 3 days. Take Compazine instead.   If you develop nausea and vomiting that is not controlled by your nausea medication, call the clinic.   BELOW ARE SYMPTOMS THAT SHOULD BE REPORTED IMMEDIATELY:  *FEVER GREATER THAN 100.5 F  *CHILLS WITH OR WITHOUT FEVER  NAUSEA AND VOMITING THAT IS NOT CONTROLLED WITH YOUR NAUSEA MEDICATION  *UNUSUAL SHORTNESS OF BREATH  *UNUSUAL BRUISING OR BLEEDING  TENDERNESS IN MOUTH AND THROAT WITH OR WITHOUT PRESENCE OF ULCERS  *URINARY PROBLEMS  *BOWEL PROBLEMS  UNUSUAL RASH Items with * indicate a potential emergency and should be followed up as soon as possible.  Feel free to call the clinic you have any questions or concerns. The clinic phone number is [phone].  Please show the [place] at check-in to the Emergency Department and triage nurse.

## 2016-02-26 ENCOUNTER — Other Ambulatory Visit: Payer: Self-pay | Admitting: Medical Oncology

## 2016-02-26 DIAGNOSIS — C3492 Malignant neoplasm of unspecified part of left bronchus or lung: Secondary | ICD-10-CM

## 2016-02-27 ENCOUNTER — Ambulatory Visit (HOSPITAL_BASED_OUTPATIENT_CLINIC_OR_DEPARTMENT_OTHER): Payer: BLUE CROSS/BLUE SHIELD

## 2016-02-27 ENCOUNTER — Ambulatory Visit: Payer: BLUE CROSS/BLUE SHIELD

## 2016-02-27 ENCOUNTER — Other Ambulatory Visit: Payer: BLUE CROSS/BLUE SHIELD

## 2016-02-27 VITALS — BP 148/85 | HR 93 | Temp 98.4°F | Resp 18

## 2016-02-27 DIAGNOSIS — C3490 Malignant neoplasm of unspecified part of unspecified bronchus or lung: Secondary | ICD-10-CM

## 2016-02-27 DIAGNOSIS — C3492 Malignant neoplasm of unspecified part of left bronchus or lung: Secondary | ICD-10-CM | POA: Diagnosis not present

## 2016-02-27 DIAGNOSIS — Z5111 Encounter for antineoplastic chemotherapy: Secondary | ICD-10-CM

## 2016-02-27 LAB — CBC WITH DIFFERENTIAL/PLATELET
BASO%: 0.3 % (ref 0.0–2.0)
BASOS ABS: 0 10*3/uL (ref 0.0–0.1)
EOS%: 0.3 % (ref 0.0–7.0)
Eosinophils Absolute: 0 10*3/uL (ref 0.0–0.5)
HEMATOCRIT: 29.2 % — AB (ref 38.4–49.9)
HEMOGLOBIN: 10.3 g/dL — AB (ref 13.0–17.1)
LYMPH#: 0.9 10*3/uL (ref 0.9–3.3)
LYMPH%: 27.4 % (ref 14.0–49.0)
MCH: 35.3 pg — AB (ref 27.2–33.4)
MCHC: 35.3 g/dL (ref 32.0–36.0)
MCV: 100 fL — ABNORMAL HIGH (ref 79.3–98.0)
MONO#: 0.3 10*3/uL (ref 0.1–0.9)
MONO%: 10.4 % (ref 0.0–14.0)
NEUT#: 2 10*3/uL (ref 1.5–6.5)
NEUT%: 61.6 % (ref 39.0–75.0)
NRBC: 1 % — AB (ref 0–0)
Platelets: 180 10*3/uL (ref 140–400)
RBC: 2.92 10*6/uL — ABNORMAL LOW (ref 4.20–5.82)
RDW: 17 % — AB (ref 11.0–14.6)
WBC: 3.3 10*3/uL — ABNORMAL LOW (ref 4.0–10.3)

## 2016-02-27 LAB — COMPREHENSIVE METABOLIC PANEL
ALBUMIN: 3.2 g/dL — AB (ref 3.5–5.0)
ALK PHOS: 221 U/L — AB (ref 40–150)
ALT: 21 U/L (ref 0–55)
AST: 30 U/L (ref 5–34)
Anion Gap: 10 mEq/L (ref 3–11)
BILIRUBIN TOTAL: 0.37 mg/dL (ref 0.20–1.20)
BUN: 4.2 mg/dL — AB (ref 7.0–26.0)
CALCIUM: 9.2 mg/dL (ref 8.4–10.4)
CO2: 24 mEq/L (ref 22–29)
CREATININE: 0.8 mg/dL (ref 0.7–1.3)
Chloride: 100 mEq/L (ref 98–109)
EGFR: >90 mL/min/{1.73_m2} (ref >90)
GLUCOSE: 124 mg/dL (ref 70–140)
POTASSIUM: 4.2 meq/L (ref 3.5–5.1)
SODIUM: 134 meq/L — AB (ref 136–145)
TOTAL PROTEIN: 6.9 g/dL (ref 6.4–8.3)

## 2016-02-27 MED ORDER — PALONOSETRON HCL INJECTION 0.25 MG/5ML
0.2500 mg | Freq: Once | INTRAVENOUS | Status: AC
  Administered 2016-02-27: 0.25 mg via INTRAVENOUS

## 2016-02-27 MED ORDER — SODIUM CHLORIDE 0.9 % IV SOLN
Freq: Once | INTRAVENOUS | Status: AC
  Administered 2016-02-27: 09:00:00 via INTRAVENOUS

## 2016-02-27 MED ORDER — SODIUM CHLORIDE 0.9% FLUSH
10.0000 mL | INTRAVENOUS | Status: DC | PRN
  Administered 2016-02-27: 10 mL
  Filled 2016-02-27: qty 10

## 2016-02-27 MED ORDER — POTASSIUM CHLORIDE 2 MEQ/ML IV SOLN
Freq: Once | INTRAVENOUS | Status: AC
  Administered 2016-02-27: 10:00:00 via INTRAVENOUS
  Filled 2016-02-27: qty 10

## 2016-02-27 MED ORDER — PALONOSETRON HCL INJECTION 0.25 MG/5ML
INTRAVENOUS | Status: AC
  Filled 2016-02-27: qty 5

## 2016-02-27 MED ORDER — HEPARIN SOD (PORK) LOCK FLUSH 100 UNIT/ML IV SOLN
500.0000 [IU] | Freq: Once | INTRAVENOUS | Status: AC | PRN
  Administered 2016-02-27: 500 [IU]
  Filled 2016-02-27: qty 5

## 2016-02-27 MED ORDER — FOSAPREPITANT DIMEGLUMINE INJECTION 150 MG
Freq: Once | INTRAVENOUS | Status: AC
  Administered 2016-02-27: 12:00:00 via INTRAVENOUS
  Filled 2016-02-27: qty 5

## 2016-02-27 MED ORDER — SODIUM CHLORIDE 0.9 % IV SOLN
30.0000 mg/m2 | Freq: Once | INTRAVENOUS | Status: AC
  Administered 2016-02-27: 55 mg via INTRAVENOUS
  Filled 2016-02-27: qty 55

## 2016-02-27 MED ORDER — ATROPINE SULFATE 1 MG/ML IJ SOLN
0.5000 mg | Freq: Once | INTRAMUSCULAR | Status: AC | PRN
  Administered 2016-02-27: 0.5 mg via INTRAVENOUS

## 2016-02-27 MED ORDER — IRINOTECAN HCL CHEMO INJECTION 100 MG/5ML
65.0000 mg/m2 | Freq: Once | INTRAVENOUS | Status: AC
  Administered 2016-02-27: 120 mg via INTRAVENOUS
  Filled 2016-02-27: qty 6

## 2016-02-27 NOTE — Patient Instructions (Signed)
[facility] Discharge Instructions for Patients Receiving Chemotherapy  Today you received the following chemotherapy agents Irinotecan and Cisplatin  To help prevent nausea and vomiting after your treatment, we encourage you to take your nausea medication as directed. No Zofran for 3 days. Take Compazine instead.   If you develop nausea and vomiting that is not controlled by your nausea medication, call the clinic.   BELOW ARE SYMPTOMS THAT SHOULD BE REPORTED IMMEDIATELY:  *FEVER GREATER THAN 100.5 F  *CHILLS WITH OR WITHOUT FEVER  NAUSEA AND VOMITING THAT IS NOT CONTROLLED WITH YOUR NAUSEA MEDICATION  *UNUSUAL SHORTNESS OF BREATH  *UNUSUAL BRUISING OR BLEEDING  TENDERNESS IN MOUTH AND THROAT WITH OR WITHOUT PRESENCE OF ULCERS  *URINARY PROBLEMS  *BOWEL PROBLEMS  UNUSUAL RASH Items with * indicate a potential emergency and should be followed up as soon as possible.  Feel free to call the clinic you have any questions or concerns. The clinic phone number is [phone].  Please show the [facility] at check-in to the Emergency Department and triage nurse.

## 2016-03-04 ENCOUNTER — Ambulatory Visit: Payer: BLUE CROSS/BLUE SHIELD

## 2016-03-04 ENCOUNTER — Ambulatory Visit: Payer: BLUE CROSS/BLUE SHIELD | Admitting: Internal Medicine

## 2016-03-04 ENCOUNTER — Other Ambulatory Visit: Payer: BLUE CROSS/BLUE SHIELD

## 2016-03-05 ENCOUNTER — Telehealth: Payer: Self-pay | Admitting: Internal Medicine

## 2016-03-05 NOTE — Telephone Encounter (Signed)
Patient called to have all future appointments canceled per moving to [place] and will receive treatment there.

## 2016-03-12 ENCOUNTER — Other Ambulatory Visit: Payer: BLUE CROSS/BLUE SHIELD

## 2016-03-12 ENCOUNTER — Ambulatory Visit: Payer: BLUE CROSS/BLUE SHIELD | Admitting: Internal Medicine

## 2016-03-12 ENCOUNTER — Ambulatory Visit: Payer: BLUE CROSS/BLUE SHIELD

## 2016-03-16 ENCOUNTER — Telehealth: Payer: Self-pay | Admitting: *Deleted

## 2016-03-16 NOTE — Telephone Encounter (Signed)
Lauren with Dr. Stephan Minister [phone].  Dr. Clarita Leber would like results of scans for this patient he is seeing now.  Fax these to [place] at [phone]."  Routed results as requested.

## 2016-03-19 ENCOUNTER — Ambulatory Visit: Payer: BLUE CROSS/BLUE SHIELD

## 2016-03-19 ENCOUNTER — Other Ambulatory Visit: Payer: BLUE CROSS/BLUE SHIELD

## 2016-04-02 ENCOUNTER — Other Ambulatory Visit: Payer: Self-pay | Admitting: Internal Medicine

## 2016-04-09 ENCOUNTER — Telehealth: Payer: Self-pay | Admitting: *Deleted

## 2016-04-09 NOTE — Telephone Encounter (Signed)
Patient seeing ID while at [facility]. They are requesting his last and first appointments here. They are faxing a signed release to [phone]. Landis Gandy, RN

## 2016-04-09 NOTE — Telephone Encounter (Signed)
Spoke with patient. He has moved permanently to [facility]. Will update our records. Landis Gandy, RN

## 2016-07-09 ENCOUNTER — Telehealth: Payer: Self-pay | Admitting: *Deleted

## 2016-07-09 NOTE — Telephone Encounter (Signed)
On 07-09-16 fax medical records to [facility], it was follow up, sim & planning note, end of tx note, consult note, and dos will do dhv and whatever else

## 2023-03-18 ENCOUNTER — Other Ambulatory Visit: Payer: Self-pay | Admitting: Physician Assistant
